# Patient Record
Sex: Male | Born: 1965
Health system: Southern US, Community
[De-identification: ages and names within clinical notes are randomized; demographics above are authoritative.]

## PROBLEM LIST (undated history)

## (undated) DIAGNOSIS — R5382 Chronic fatigue, unspecified: Secondary | ICD-10-CM

## (undated) DIAGNOSIS — F32A Depression, unspecified: Secondary | ICD-10-CM

## (undated) DIAGNOSIS — G8929 Other chronic pain: Secondary | ICD-10-CM

## (undated) DIAGNOSIS — K439 Ventral hernia without obstruction or gangrene: Secondary | ICD-10-CM

## (undated) DIAGNOSIS — K9189 Other postprocedural complications and disorders of digestive system: Secondary | ICD-10-CM

## (undated) DIAGNOSIS — G4730 Sleep apnea, unspecified: Secondary | ICD-10-CM

## (undated) DIAGNOSIS — F319 Bipolar disorder, unspecified: Secondary | ICD-10-CM

## (undated) DIAGNOSIS — M797 Fibromyalgia: Secondary | ICD-10-CM

## (undated) DIAGNOSIS — F419 Anxiety disorder, unspecified: Secondary | ICD-10-CM

## (undated) DIAGNOSIS — E785 Hyperlipidemia, unspecified: Secondary | ICD-10-CM

## (undated) DIAGNOSIS — K56601 Complete intestinal obstruction, unspecified as to cause: Secondary | ICD-10-CM

## (undated) DIAGNOSIS — I10 Essential (primary) hypertension: Secondary | ICD-10-CM

## (undated) DIAGNOSIS — K567 Ileus, unspecified: Secondary | ICD-10-CM

## (undated) HISTORY — DX: Hyperlipidemia, unspecified: E78.5

## (undated) HISTORY — DX: Depression, unspecified: F32.A

## (undated) HISTORY — DX: Complete intestinal obstruction, unspecified as to cause: K56.601

## (undated) HISTORY — DX: Other postprocedural complications and disorders of digestive system: K91.89

## (undated) HISTORY — PX: TONSILLECTOMY: SUR1361

## (undated) HISTORY — PX: SMALL INTESTINE SURGERY: SHX150

## (undated) HISTORY — DX: Ileus, unspecified: K56.7

---
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
## 1998-10-17 ENCOUNTER — Encounter: Admission: RE | Admit: 1998-10-17 | Discharge: 1999-01-15 | Payer: Self-pay | Admitting: Family Medicine

## 1998-12-28 ENCOUNTER — Emergency Department (HOSPITAL_COMMUNITY): Admission: EM | Admit: 1998-12-28 | Discharge: 1998-12-28 | Payer: Self-pay | Admitting: Emergency Medicine

## 1999-02-06 ENCOUNTER — Encounter: Admission: RE | Admit: 1999-02-06 | Discharge: 1999-05-07 | Payer: Self-pay | Admitting: Family Medicine

## 1999-10-06 ENCOUNTER — Encounter: Admission: RE | Admit: 1999-10-06 | Discharge: 2000-01-04 | Payer: Self-pay | Admitting: Family Medicine

## 2000-08-05 ENCOUNTER — Encounter: Payer: Self-pay | Admitting: Ophthalmology

## 2000-08-05 ENCOUNTER — Ambulatory Visit (HOSPITAL_COMMUNITY): Admission: RE | Admit: 2000-08-05 | Discharge: 2000-08-05 | Payer: Self-pay | Admitting: Ophthalmology

## 2000-10-04 ENCOUNTER — Encounter: Admission: RE | Admit: 2000-10-04 | Discharge: 2000-10-13 | Payer: Self-pay | Admitting: Neurology

## 2001-06-16 ENCOUNTER — Encounter: Admission: RE | Admit: 2001-06-16 | Discharge: 2001-06-16 | Payer: Self-pay | Admitting: Family Medicine

## 2001-06-28 ENCOUNTER — Encounter: Admission: RE | Admit: 2001-06-28 | Discharge: 2001-06-28 | Payer: Self-pay | Admitting: Family Medicine

## 2001-11-06 ENCOUNTER — Ambulatory Visit (HOSPITAL_COMMUNITY): Admission: RE | Admit: 2001-11-06 | Discharge: 2001-11-06 | Payer: Self-pay | Admitting: Orthopedic Surgery

## 2001-11-06 ENCOUNTER — Encounter: Payer: Self-pay | Admitting: Orthopedic Surgery

## 2001-11-14 ENCOUNTER — Ambulatory Visit (HOSPITAL_BASED_OUTPATIENT_CLINIC_OR_DEPARTMENT_OTHER): Admission: RE | Admit: 2001-11-14 | Discharge: 2001-11-14 | Payer: Self-pay | Admitting: Orthopedic Surgery

## 2001-12-01 ENCOUNTER — Encounter: Admission: RE | Admit: 2001-12-01 | Discharge: 2001-12-25 | Payer: Self-pay | Admitting: Orthopedic Surgery

## 2002-06-20 ENCOUNTER — Ambulatory Visit (HOSPITAL_COMMUNITY): Admission: RE | Admit: 2002-06-20 | Discharge: 2002-06-20 | Payer: Self-pay | Admitting: Family Medicine

## 2002-06-20 ENCOUNTER — Encounter: Payer: Self-pay | Admitting: Family Medicine

## 2002-09-29 ENCOUNTER — Emergency Department (HOSPITAL_COMMUNITY): Admission: EM | Admit: 2002-09-29 | Discharge: 2002-09-29 | Payer: Self-pay | Admitting: Emergency Medicine

## 2002-09-29 ENCOUNTER — Encounter: Payer: Self-pay | Admitting: Emergency Medicine

## 2002-11-05 ENCOUNTER — Encounter: Payer: Self-pay | Admitting: Family Medicine

## 2002-11-05 ENCOUNTER — Ambulatory Visit (HOSPITAL_COMMUNITY): Admission: RE | Admit: 2002-11-05 | Discharge: 2002-11-05 | Payer: Self-pay | Admitting: Family Medicine

## 2002-11-07 ENCOUNTER — Emergency Department (HOSPITAL_COMMUNITY): Admission: EM | Admit: 2002-11-07 | Discharge: 2002-11-07 | Payer: Self-pay | Admitting: Emergency Medicine

## 2002-11-16 ENCOUNTER — Encounter: Payer: Self-pay | Admitting: Family Medicine

## 2002-11-16 ENCOUNTER — Ambulatory Visit (HOSPITAL_COMMUNITY): Admission: RE | Admit: 2002-11-16 | Discharge: 2002-11-16 | Payer: Self-pay | Admitting: Family Medicine

## 2002-11-26 ENCOUNTER — Encounter: Payer: Self-pay | Admitting: Pulmonary Disease

## 2002-11-26 ENCOUNTER — Ambulatory Visit (HOSPITAL_COMMUNITY): Admission: RE | Admit: 2002-11-26 | Discharge: 2002-11-26 | Payer: Self-pay | Admitting: Pulmonary Disease

## 2002-12-17 ENCOUNTER — Encounter: Payer: Self-pay | Admitting: Family Medicine

## 2002-12-17 ENCOUNTER — Ambulatory Visit (HOSPITAL_COMMUNITY): Admission: RE | Admit: 2002-12-17 | Discharge: 2002-12-17 | Payer: Self-pay | Admitting: Family Medicine

## 2003-04-15 ENCOUNTER — Ambulatory Visit (HOSPITAL_COMMUNITY): Admission: RE | Admit: 2003-04-15 | Discharge: 2003-04-15 | Payer: Self-pay | Admitting: Family Medicine

## 2003-04-15 ENCOUNTER — Encounter: Payer: Self-pay | Admitting: Family Medicine

## 2003-04-29 ENCOUNTER — Ambulatory Visit (HOSPITAL_COMMUNITY): Admission: RE | Admit: 2003-04-29 | Discharge: 2003-04-29 | Payer: Self-pay | Admitting: Family Medicine

## 2003-04-29 ENCOUNTER — Encounter: Payer: Self-pay | Admitting: Family Medicine

## 2003-06-14 ENCOUNTER — Ambulatory Visit (HOSPITAL_BASED_OUTPATIENT_CLINIC_OR_DEPARTMENT_OTHER): Admission: RE | Admit: 2003-06-14 | Discharge: 2003-06-14 | Payer: Self-pay | Admitting: Neurology

## 2003-11-19 ENCOUNTER — Ambulatory Visit (HOSPITAL_COMMUNITY): Admission: RE | Admit: 2003-11-19 | Discharge: 2003-11-19 | Payer: Self-pay | Admitting: Internal Medicine

## 2004-12-27 HISTORY — PX: KNEE ARTHROSCOPY: SUR90

## 2005-04-21 ENCOUNTER — Ambulatory Visit: Payer: Self-pay | Admitting: Gastroenterology

## 2005-05-31 ENCOUNTER — Ambulatory Visit: Payer: Self-pay | Admitting: Gastroenterology

## 2005-06-02 ENCOUNTER — Ambulatory Visit: Payer: Self-pay | Admitting: Gastroenterology

## 2005-08-27 ENCOUNTER — Ambulatory Visit: Payer: Self-pay | Admitting: Family Medicine

## 2005-09-20 ENCOUNTER — Ambulatory Visit: Payer: Self-pay | Admitting: Family Medicine

## 2006-01-06 ENCOUNTER — Ambulatory Visit: Payer: Self-pay | Admitting: Family Medicine

## 2007-02-22 ENCOUNTER — Ambulatory Visit (HOSPITAL_COMMUNITY): Admission: RE | Admit: 2007-02-22 | Discharge: 2007-02-22 | Payer: Self-pay | Admitting: Pulmonary Disease

## 2007-04-26 ENCOUNTER — Ambulatory Visit: Payer: Self-pay | Admitting: Family Medicine

## 2007-04-26 DIAGNOSIS — F329 Major depressive disorder, single episode, unspecified: Secondary | ICD-10-CM | POA: Insufficient documentation

## 2007-04-26 DIAGNOSIS — E782 Mixed hyperlipidemia: Secondary | ICD-10-CM | POA: Insufficient documentation

## 2007-04-26 DIAGNOSIS — E785 Hyperlipidemia, unspecified: Secondary | ICD-10-CM | POA: Insufficient documentation

## 2007-04-26 DIAGNOSIS — J309 Allergic rhinitis, unspecified: Secondary | ICD-10-CM | POA: Insufficient documentation

## 2007-04-26 DIAGNOSIS — F319 Bipolar disorder, unspecified: Secondary | ICD-10-CM | POA: Insufficient documentation

## 2007-04-28 ENCOUNTER — Encounter (INDEPENDENT_AMBULATORY_CARE_PROVIDER_SITE_OTHER): Payer: Self-pay | Admitting: Family Medicine

## 2007-05-01 LAB — CONVERTED CEMR LAB
ALT: 28 units/L (ref 0–53)
AST: 16 units/L (ref 0–37)
Albumin: 4.1 g/dL (ref 3.5–5.2)
Alkaline Phosphatase: 77 units/L (ref 39–117)
BUN: 9 mg/dL (ref 6–23)
Basophils Absolute: 0 10*3/uL (ref 0.0–0.1)
Basophils Relative: 0 % (ref 0–1)
CO2: 28 meq/L (ref 19–32)
Calcium: 9.2 mg/dL (ref 8.4–10.5)
Chloride: 105 meq/L (ref 96–112)
Cholesterol: 187 mg/dL (ref 0–200)
Creatinine, Ser: 1.09 mg/dL (ref 0.40–1.50)
Eosinophils Absolute: 0.1 10*3/uL (ref 0.0–0.7)
Eosinophils Relative: 2 % (ref 0–5)
Glucose, Bld: 87 mg/dL (ref 70–99)
HCT: 46 % (ref 39.0–52.0)
HDL: 36 mg/dL — ABNORMAL LOW (ref >39)
Hemoglobin: 15.5 g/dL (ref 13.0–17.0)
LDL Cholesterol: 100 mg/dL — ABNORMAL HIGH (ref 0–99)
Lymphocytes Relative: 32 % (ref 12–46)
Lymphs Abs: 2.7 10*3/uL (ref 0.7–3.3)
MCHC: 33.7 g/dL (ref 30.0–36.0)
MCV: 88.3 fL (ref 78.0–100.0)
Monocytes Absolute: 0.6 10*3/uL (ref 0.2–0.7)
Monocytes Relative: 7 % (ref 3–11)
Neutro Abs: 4.9 10*3/uL (ref 1.7–7.7)
Neutrophils Relative %: 59 % (ref 43–77)
Platelets: 207 10*3/uL (ref 150–400)
Potassium: 4.3 meq/L (ref 3.5–5.3)
RBC: 5.21 M/uL (ref 4.22–5.81)
RDW: 14 % (ref 11.5–14.0)
Sodium: 143 meq/L (ref 135–145)
TSH: 1.733 microintl units/mL (ref 0.350–5.50)
Total Bilirubin: 1.4 mg/dL — ABNORMAL HIGH (ref 0.3–1.2)
Total CHOL/HDL Ratio: 5.2
Total Protein: 6.6 g/dL (ref 6.0–8.3)
Triglycerides: 253 mg/dL — ABNORMAL HIGH (ref <150)
VLDL: 51 mg/dL — ABNORMAL HIGH (ref 0–40)
WBC: 8.3 10*3/uL (ref 4.0–10.5)

## 2007-05-02 ENCOUNTER — Encounter (INDEPENDENT_AMBULATORY_CARE_PROVIDER_SITE_OTHER): Payer: Self-pay | Admitting: Family Medicine

## 2007-10-16 ENCOUNTER — Telehealth (INDEPENDENT_AMBULATORY_CARE_PROVIDER_SITE_OTHER): Payer: Self-pay | Admitting: Family Medicine

## 2007-10-17 ENCOUNTER — Telehealth (INDEPENDENT_AMBULATORY_CARE_PROVIDER_SITE_OTHER): Payer: Self-pay | Admitting: *Deleted

## 2007-10-17 ENCOUNTER — Ambulatory Visit: Payer: Self-pay | Admitting: Family Medicine

## 2007-10-17 LAB — CONVERTED CEMR LAB
Basophils Absolute: 0 10*3/uL (ref 0.0–0.1)
Basophils Relative: 0 % (ref 0–1)
Bilirubin Urine: NEGATIVE
Blood in Urine, dipstick: NEGATIVE
Eosinophils Absolute: 0.2 10*3/uL (ref 0.0–0.7)
Eosinophils Relative: 2 % (ref 0–5)
Glucose, Urine, Semiquant: NEGATIVE
HCT: 40.6 % (ref 39.0–52.0)
Hemoglobin: 14.3 g/dL (ref 13.0–17.0)
Inflenza A Ag: NEGATIVE
Ketones, urine, test strip: NEGATIVE
Lymphocytes Relative: 39 % (ref 12–46)
Lymphs Abs: 3.5 10*3/uL — ABNORMAL HIGH (ref 0.7–3.3)
MCHC: 35.3 g/dL (ref 30.0–36.0)
MCV: 86.3 fL (ref 78.0–100.0)
Monocytes Absolute: 0.4 10*3/uL (ref 0.2–0.7)
Monocytes Relative: 5 % (ref 3–11)
Neutro Abs: 4.7 10*3/uL (ref 1.7–7.7)
Neutrophils Relative %: 53 % (ref 43–77)
Nitrite: NEGATIVE
Platelets: 200 10*3/uL (ref 150–400)
Protein, U semiquant: NEGATIVE
RBC: 4.7 M/uL (ref 4.22–5.81)
RDW: 13.3 % (ref 11.5–14.0)
Specific Gravity, Urine: >=1.03
Urobilinogen, UA: 0.2
WBC Urine, dipstick: NEGATIVE
WBC: 8.9 10*3/uL (ref 4.0–10.5)
pH: 6

## 2007-10-25 ENCOUNTER — Emergency Department (HOSPITAL_COMMUNITY): Admission: EM | Admit: 2007-10-25 | Discharge: 2007-10-25 | Payer: Self-pay | Admitting: Emergency Medicine

## 2007-10-25 ENCOUNTER — Telehealth (INDEPENDENT_AMBULATORY_CARE_PROVIDER_SITE_OTHER): Payer: Self-pay | Admitting: *Deleted

## 2007-10-31 ENCOUNTER — Ambulatory Visit: Payer: Self-pay | Admitting: Family Medicine

## 2007-11-01 ENCOUNTER — Telehealth (INDEPENDENT_AMBULATORY_CARE_PROVIDER_SITE_OTHER): Payer: Self-pay | Admitting: Family Medicine

## 2007-11-01 ENCOUNTER — Telehealth (INDEPENDENT_AMBULATORY_CARE_PROVIDER_SITE_OTHER): Payer: Self-pay | Admitting: *Deleted

## 2007-11-01 LAB — CONVERTED CEMR LAB: Total CK: 251 units/L — ABNORMAL HIGH (ref 7–232)

## 2007-11-03 ENCOUNTER — Telehealth (INDEPENDENT_AMBULATORY_CARE_PROVIDER_SITE_OTHER): Payer: Self-pay | Admitting: Family Medicine

## 2007-11-06 ENCOUNTER — Ambulatory Visit: Payer: Self-pay | Admitting: Family Medicine

## 2007-11-06 ENCOUNTER — Telehealth (INDEPENDENT_AMBULATORY_CARE_PROVIDER_SITE_OTHER): Payer: Self-pay | Admitting: Family Medicine

## 2007-11-06 DIAGNOSIS — R42 Dizziness and giddiness: Secondary | ICD-10-CM

## 2007-11-06 LAB — CONVERTED CEMR LAB
BUN: 10 mg/dL (ref 6–23)
Basophils Absolute: 0.1 10*3/uL (ref 0.0–0.1)
Basophils Relative: 1 % (ref 0–1)
CO2: 28 meq/L (ref 19–32)
Calcium: 8.6 mg/dL (ref 8.4–10.5)
Chloride: 103 meq/L (ref 96–112)
Creatinine, Ser: 1.01 mg/dL (ref 0.40–1.50)
Eosinophils Absolute: 0.2 10*3/uL (ref 0.2–0.7)
Eosinophils Relative: 2 % (ref 0–5)
Glucose, Bld: 104 mg/dL — ABNORMAL HIGH (ref 70–99)
HCT: 44.1 % (ref 39.0–52.0)
Hemoglobin: 15.3 g/dL (ref 13.0–17.0)
Lymphocytes Relative: 28 % (ref 12–46)
Lymphs Abs: 2.8 10*3/uL (ref 0.7–4.0)
MCHC: 34.7 g/dL (ref 30.0–36.0)
MCV: 87.9 fL (ref 78.0–100.0)
Monocytes Absolute: 0.5 10*3/uL (ref 0.1–1.0)
Monocytes Relative: 5 % (ref 3–12)
Neutro Abs: 6.1 10*3/uL (ref 1.7–7.7)
Neutrophils Relative %: 63 % (ref 43–77)
Platelets: 201 10*3/uL (ref 150–400)
Potassium: 3.9 meq/L (ref 3.5–5.3)
RBC: 5.02 M/uL (ref 4.22–5.81)
RDW: 13.1 % (ref 11.5–15.5)
Sodium: 137 meq/L (ref 135–145)
WBC: 9.6 10*3/uL (ref 4.0–10.5)

## 2007-11-08 ENCOUNTER — Telehealth (INDEPENDENT_AMBULATORY_CARE_PROVIDER_SITE_OTHER): Payer: Self-pay | Admitting: Family Medicine

## 2007-11-10 ENCOUNTER — Ambulatory Visit: Payer: Self-pay | Admitting: Infectious Diseases

## 2007-11-10 DIAGNOSIS — R509 Fever, unspecified: Secondary | ICD-10-CM

## 2007-11-10 LAB — CONVERTED CEMR LAB
ALT: 33 units/L (ref 0–53)
AST: 24 units/L (ref 0–37)
Albumin: 4.4 g/dL (ref 3.5–5.2)
Alkaline Phosphatase: 78 units/L (ref 39–117)
Anti Nuclear Antibody(ANA): NEGATIVE
BUN: 13 mg/dL (ref 6–23)
Basophils Absolute: 0 10*3/uL (ref 0.0–0.1)
Basophils Relative: 0 % (ref 0–1)
Bilirubin Urine: NEGATIVE
CMV IgM: <0.9 (ref <0.90)
CO2: 26 meq/L (ref 19–32)
Calcium: 9.1 mg/dL (ref 8.4–10.5)
Chloride: 101 meq/L (ref 96–112)
Creatinine, Ser: 1.26 mg/dL (ref 0.40–1.50)
Cytomegalovirus Ab-IgG: NEGATIVE
EBV NA IgG: 2.68 — ABNORMAL HIGH
EBV VCA IgG: 3.37 — ABNORMAL HIGH
EBV VCA IgM: 0.38
Eosinophils Absolute: 0.1 10*3/uL — ABNORMAL LOW (ref 0.2–0.7)
Eosinophils Relative: 1 % (ref 0–5)
Glucose, Bld: 71 mg/dL (ref 70–99)
HCT: 47.9 % (ref 39.0–52.0)
HCV Ab: NEGATIVE
Hemoglobin, Urine: NEGATIVE
Hemoglobin: 16.8 g/dL (ref 13.0–17.0)
Hep B Core Total Ab: NEGATIVE
Hep B S Ab: NEGATIVE
Hepatitis B Surface Ag: NEGATIVE
Ketones, ur: NEGATIVE mg/dL
Leukocytes, UA: NEGATIVE
Lymphocytes Relative: 24 % (ref 12–46)
Lymphs Abs: 2 10*3/uL (ref 0.7–4.0)
MCHC: 35.1 g/dL (ref 30.0–36.0)
MCV: 88.2 fL (ref 78.0–100.0)
Monocytes Absolute: 0.4 10*3/uL (ref 0.1–1.0)
Monocytes Relative: 5 % (ref 3–12)
Neutro Abs: 5.8 10*3/uL (ref 1.7–7.7)
Neutrophils Relative %: 70 % (ref 43–77)
Nitrite: NEGATIVE
Platelets: 206 10*3/uL (ref 150–400)
Potassium: 4.1 meq/L (ref 3.5–5.3)
Protein, ur: NEGATIVE mg/dL
RBC: 5.43 M/uL (ref 4.22–5.81)
RDW: 13.9 % (ref 11.5–15.5)
Rhuematoid fact SerPl-aCnc: <20 intl units/mL (ref 0–20)
Sed Rate: 5 mm/hr (ref 0–16)
Sodium: 140 meq/L (ref 135–145)
Specific Gravity, Urine: 1.014 (ref 1.005–1.03)
Total Bilirubin: 1.1 mg/dL (ref 0.3–1.2)
Total Protein: 7.3 g/dL (ref 6.0–8.3)
Toxoplasma Antibody- IgM: 0.27
Toxoplasma IgG Ratio: <0.5
Urine Glucose: NEGATIVE mg/dL
Urobilinogen, UA: 0.2 (ref 0.0–1.0)
WBC: 8.3 10*3/uL (ref 4.0–10.5)
pH: 8 (ref 5.0–8.0)

## 2007-11-13 ENCOUNTER — Ambulatory Visit: Payer: Self-pay | Admitting: Family Medicine

## 2007-11-16 ENCOUNTER — Encounter (INDEPENDENT_AMBULATORY_CARE_PROVIDER_SITE_OTHER): Payer: Self-pay | Admitting: Family Medicine

## 2007-11-21 ENCOUNTER — Encounter (INDEPENDENT_AMBULATORY_CARE_PROVIDER_SITE_OTHER): Payer: Self-pay | Admitting: Family Medicine

## 2007-11-27 ENCOUNTER — Ambulatory Visit: Payer: Self-pay | Admitting: Infectious Diseases

## 2007-12-27 ENCOUNTER — Encounter: Payer: Self-pay | Admitting: Infectious Diseases

## 2007-12-27 ENCOUNTER — Ambulatory Visit: Payer: Self-pay | Admitting: Gastroenterology

## 2008-01-22 ENCOUNTER — Ambulatory Visit: Payer: Self-pay | Admitting: Family Medicine

## 2008-01-22 ENCOUNTER — Telehealth (INDEPENDENT_AMBULATORY_CARE_PROVIDER_SITE_OTHER): Payer: Self-pay | Admitting: *Deleted

## 2008-01-22 DIAGNOSIS — K5289 Other specified noninfective gastroenteritis and colitis: Secondary | ICD-10-CM

## 2008-01-22 LAB — CONVERTED CEMR LAB
BUN: 7 mg/dL (ref 6–23)
Basophils Absolute: 0 10*3/uL (ref 0.0–0.1)
Basophils Relative: 0 % (ref 0–1)
CO2: 26 meq/L (ref 19–32)
Calcium: 8.7 mg/dL (ref 8.4–10.5)
Chloride: 105 meq/L (ref 96–112)
Creatinine, Ser: 1.18 mg/dL (ref 0.40–1.50)
Eosinophils Absolute: 0.1 10*3/uL (ref 0.0–0.7)
Eosinophils Relative: 1 % (ref 0–5)
Glucose, Bld: 123 mg/dL — ABNORMAL HIGH (ref 70–99)
HCT: 46.2 % (ref 39.0–52.0)
Hemoglobin: 16.4 g/dL (ref 13.0–17.0)
Lymphocytes Relative: 27 % (ref 12–46)
Lymphs Abs: 1.9 10*3/uL (ref 0.7–4.0)
MCHC: 35.4 g/dL (ref 30.0–36.0)
MCV: 86.5 fL (ref 78.0–100.0)
Monocytes Absolute: 0.5 10*3/uL (ref 0.1–1.0)
Monocytes Relative: 8 % (ref 3–12)
Neutro Abs: 4.4 10*3/uL (ref 1.7–7.7)
Neutrophils Relative %: 63 % (ref 43–77)
OCCULT 1: NEGATIVE
Platelets: 237 10*3/uL (ref 150–400)
Potassium: 4.1 meq/L (ref 3.5–5.3)
RBC: 5.34 M/uL (ref 4.22–5.81)
RDW: 12.5 % (ref 11.5–15.5)
Sodium: 139 meq/L (ref 135–145)
WBC: 6.9 10*3/uL (ref 4.0–10.5)

## 2008-01-23 ENCOUNTER — Telehealth (INDEPENDENT_AMBULATORY_CARE_PROVIDER_SITE_OTHER): Payer: Self-pay | Admitting: Family Medicine

## 2008-01-24 ENCOUNTER — Encounter (INDEPENDENT_AMBULATORY_CARE_PROVIDER_SITE_OTHER): Payer: Self-pay | Admitting: Family Medicine

## 2008-01-25 ENCOUNTER — Telehealth (INDEPENDENT_AMBULATORY_CARE_PROVIDER_SITE_OTHER): Payer: Self-pay | Admitting: *Deleted

## 2008-01-29 ENCOUNTER — Encounter (INDEPENDENT_AMBULATORY_CARE_PROVIDER_SITE_OTHER): Payer: Self-pay | Admitting: Family Medicine

## 2008-07-26 ENCOUNTER — Emergency Department (HOSPITAL_COMMUNITY): Admission: EM | Admit: 2008-07-26 | Discharge: 2008-07-26 | Payer: Self-pay | Admitting: Emergency Medicine

## 2008-08-23 ENCOUNTER — Encounter (INDEPENDENT_AMBULATORY_CARE_PROVIDER_SITE_OTHER): Payer: Self-pay | Admitting: Family Medicine

## 2010-09-27 ENCOUNTER — Emergency Department (HOSPITAL_COMMUNITY)
Admission: EM | Admit: 2010-09-27 | Discharge: 2010-09-27 | Disposition: A | Discharge status: Other Acute Inpatient Hospital | Payer: Self-pay | Source: Home / Self Care | Admitting: Emergency Medicine

## 2010-11-27 ENCOUNTER — Encounter: Payer: Self-pay | Admitting: Internal Medicine

## 2010-12-28 ENCOUNTER — Emergency Department (HOSPITAL_COMMUNITY)
Admission: EM | Admit: 2010-12-28 | Discharge: 2010-12-28 | Payer: Self-pay | Source: Home / Self Care | Admitting: Emergency Medicine

## 2011-01-26 NOTE — Assessment & Plan Note (Signed)
Summary: work in/arc   Vital Signs:  Patient Profile:   45 Years Old Male Height:     72 inches Weight:      213 pounds BMI:     28.99 O2 Sat:      99 % Temp:     97.4 degrees F Pulse rate:   69 / minute Pulse (ortho):   63 / minute Resp:     14 per minute BP sitting:   140 / 80 BP standing:   133 / 86  Vitals Entered By: Sherilyn Banker (November 06, 2007 9:19 AM)                gauge: 24G site: L anticubical attempts: 1 Pt. tolerated well. Positive blood return.  Zero s/sx infiltration.  IV dc'd.  Cath intact.  Zero s/sx infiltration.  Pt. tolerated well.  Serial Vital Signs/Assessments:  Time      Position  BP       Pulse  Resp  Temp     By           Lying LA  131/84   57                    Kim French           Sitting   141/88   [address]           Standing  133/86   63                    Sherilyn Banker   PCP:  Franchot Heidelberg, MD  Chief Complaint:  nausea / vomiting.  History of Present Illness: Pt in for acute visit. He got sick mid October.  He states he is still sick. He has been struggling with fever (never confirmed here), maylgias (mildly elevated CK) with associated nausea and diarrhea. He had an eval here and was told he had viral illness. He went to [facility] ED and had negative  flu-studies and strep swab that were negative. They treated him with pain medication and he was told to follow-up here.  He denies fever today and states this stopped Saturday night. States some chills. He has had some sweats. He denies stuffy nose. He denies ear pain and sore throat. He denies cough and wheezing as well as chest pain and orthopnea, leg swelling.  He is very nauseated and states he has to lay in the floor not to have it. Dizzy with this. He states this began Thursday morning at 04h00. He has had to go to bathroom 7 otr 8 times a day since. His apetite is os-os and he has been drinking lots of water. He states he is nauseated and he has trhown  up 3 times yesterday  -none today. Has used Phenergan as given ED.   He is weak and tired and he notes he still hurts - rates overall discomfort as 6/10. STates was aweful first thing this am. Felt could not drive to work.  States he has multiple ill contacts at home with same. One of them is going to [facility] for eval. He states he only feels 25% better than he did when he first came in.   He now presents.    Current Allergies (reviewed today): ! PENICILLIN  Past Medical History:    Reviewed history from 04/26/2007  and no changes required:       Severe pneumonia 2003, takes allergy shots       Allergic rhinitis       Depression  Past Surgical History:    Reviewed history from 04/26/2007 and no changes required:       lymph node bx benign - 08/27/2005, T & A - 08/27/2005       LASIK Eye Surgery     Review of Systems      See HPI   Physical Exam  General:     Well-developed,well-nourished,in no acute distress; alert,appropriate and cooperative throughout examination. Appeaers fatigued. SIts still onm chair with head down.  Head:     Normocephalic and atraumatic without obvious abnormalities. No apparent alopecia or balding. Eyes:     No corneal or conjunctival inflammation noted. EOMI. Perrla.  Ears:     External ear exam shows no significant lesions or deformities.  Otoscopic examination reveals clear canals, tympanic membranes are intact bilaterally without bulging, retraction, inflammation or discharge. Hearing is grossly normal bilaterally. Nose:     External nasal examination shows no deformity or inflammation. Nasal mucosa are pink and moist without lesions or exudates. Mouth:     Oral mucosa and oropharynx without lesions or exudates.  Teeth in good repair. Neck:     No deformities, masses, or tenderness noted. Lungs:     Normal respiratory effort, chest expands symmetrically. Lungs are clear to auscultation, no crackles or wheezes. Heart:     Normal rate and  regular rhythm. S1 and S2 normal without gallop, murmur, click, rub or other extra sounds. Abdomen:     Bowel sounds positive but increased,abdomen soft and non-tender without masses, organomegaly or hernias noted. Extremities:     No clubbing, cyanosis, edema, or deformity noted with normal full range of motion of all joints.   Neurologic:     No cranial nerve deficits noted. Station and gait are normal. Plantar reflexes are down-going bilaterally. DTRs are symmetrical throughout. Sensory, motor and coordinative functions appear intact. Cervical Nodes:     No lymphadenopathy noted Psych:     Cognition and judgment appear intact. Alert and cooperative with normal attention span and concentration. No apparent delusions, illusions, hallucinations    Impression & Recommendations:  Problem # 1:  NAUSEA AND VOMITING (ICD-787.01) He was extremely nauseated on presentation and also dizzy though not orthostatic. He threw up once in the restroom and was given single dose Phenergan. He tolerated this well and had no further vomitting with marked improvement in nausea. Will refill script. We did hydarte him based on sx with NS 500 cc and he felt a lot better after. He was smiling and talkative at the end of the visit and notes he was worried when he would start feeling weak again. I have advised him his sx do not fit a specific pattern - fever and chlills, then aching with diarrhea and constipation. He has missed a lot of work. Reassured about labs today i.e. normal CBC and BMP. Will ask infectious disease to eval given persistance of sx since October 17, 2007. Agrees. Referal made by nurse. Wewill defer return to work until we see when ID can see him to help optomize and this will of course also guide return to work.  Orders: T-CBC w/Diff ([id number]) T-Basic Metabolic Panel [phone]) Venipuncture (64403) Promethazine up to 50mg  (J2550) Admin of Therapeutic Inj  intramuscular or subcutaneous  (47425)   Problem # 2:  DIARRHEA (ICD-787.91) See above.  Advised clear liquid diet, gatoraide and advance as tolerated. He is not to use Immodium or similar as this can prolong sx. The sx just started and if they persist for more thasn 72 hours, he is to update [place]. WIll obtain stool studies i.e. C.Diff, OCP etc at that time. Orders: T-CBC w/Diff [id number]) T-Basic Metabolic Panel [phone]) Venipuncture (46962)   Problem # 3:  DIZZINESS (ICD-780.4) Likely early dehydration. Sx resolved wpost IV fluid. Follow. His updated medication list for this problem includes:    Promethazine Hcl 25 Mg Tabs (Promethazine hcl) ..... One every 6 hours as needed  Orders: T-CBC w/Diff ([id number]) T-Basic Metabolic Panel [phone]) Venipuncture (44034)   Problem # 4:  MYALGIA (ICD-729.1) Mildly elavted CK. Tylenol 500 mg two by mouth every 8 hours as needed. Do notfeel narcotic warranted as per ED. Orders: Venipuncture (74259)   Complete Medication List: 1)  Adderall 20 Mg Tabs (Amphetamine-dextroamphetamine) .... Three times a day 2)  Klonopin 0.5 Mg Tabs (Clonazepam) .... 1/2 at bedtime 3)  Protonix 40 Mg Tbec (Pantoprazole sodium) .... One daily 4)  Astelin 137 Mcg/spray Soln (Azelastine hcl) .... As needed 5)  Lexapro 20 Mg Tabs (Escitalopram oxalate) .... One at bedtime 6)  Promethazine Hcl 25 Mg Tabs (Promethazine hcl) .... One every 6 hours as needed   Patient Instructions: 1)  Please schedule a follow-up appointment in 2 weeks - sooner if worse.    Prescriptions: PROMETHAZINE HCL 25 MG  TABS (PROMETHAZINE HCL) One every 6 hours as needed  #30 x 0   Entered and Authorized by:   Franchot Heidelberg MD   Signed by:   Franchot Heidelberg MD on 11/06/2007   Method used:   Print then Give to Patient   RxID:   [id number]  ]  Medication Administration  Injection # 1:    Medication: Promethazine up to 50mg     Diagnosis: NAUSEA AND VOMITING (ICD-787.01)    Route:  IM    Site: L thigh    Exp Date: 11/26/2008    Lot #: [id number]    Mfr: [email or web address]    Patient tolerated injection without complications    Given by: Sherilyn Banker (November 06, 2007 11:34 AM)  Orders Added: 1)  T-CBC w/Diff [[id number]] 2)  T-Basic Metabolic Panel [[id number]] 3)  Venipuncture [42706] 4)  Promethazine up to 50mg  [J2550] 5)  Admin of Therapeutic Inj  intramuscular or subcutaneous [90772] 6)  Est. Patient Level IV [23762]  Appended Document: Fluid Billing    Clinical Lists Changes  Orders: Added new Service order of IV Fluids 1st hr Non Medicare (83151) - Signed Added new Service order of IV Fluids each hr after 1st hr  Non Medicare (76160) - Signed

## 2011-01-26 NOTE — Letter (Signed)
Summary: FMLA paperwork  FMLA paperwork   Imported By: Donneta Romberg 11/16/2007 10:04:21  _____________________________________________________________________  External Attachment:    Type:   Image     Comment:   External Document

## 2011-01-26 NOTE — Letter (Signed)
Summary: PSYCHIATRIC NOTE  PSYCHIATRIC NOTE   Imported By: Magdalene River 04/28/2007 13:48:29  _____________________________________________________________________  External Attachment:    Type:   Image     Comment:   External Document

## 2011-01-26 NOTE — Letter (Signed)
Summary: ING paperwork  ING paperwork   Imported By: Donneta Romberg 11/21/2007 15:13:41  _____________________________________________________________________  External Attachment:    Type:   Image     Comment:   External Document

## 2011-01-26 NOTE — Miscellaneous (Signed)
Summary: Orders Update  Clinical Lists Changes  Orders: Added new Referral order of Infectious Disease Referral (ID) - Signed

## 2011-01-26 NOTE — Progress Notes (Signed)
Summary: 10/31/07 lab results  Phone Note Outgoing Call   Call placed by: Sonny Dandy,  November 01, 2007 9:49 AM Summary of Call: Results given to patient, voices understanding .................................................................Marland KitchenMarland KitchenSonny Dandy  November 01, 2007 9:49 AM   Initial call taken by: Sonny Dandy,  November 01, 2007 9:49 AM

## 2011-01-26 NOTE — Assessment & Plan Note (Signed)
Summary: FU OV/VS   PCP:  Franchot Heidelberg, MD  Chief Complaint:  f/u.   Has chest congestion and coughing up green drainage.Marland Kitchen  History of Present Illness: 45 yo M seen prev in ID clinic for fuo and loose BM.   CT chest/abd/pelvis - not yet done. EBV, CMV, Toxo - negative  bxc, ucx - negative stool wbc, o and p, cx, c diff - negative ana - negative hepatitis b/c, hiv - negative  Now presents with c/o that his wife coughed on him and gave him sinusitis and bronchitis.  Has taken no anbx. Has gone back to work.  States that the aches, fevers have resolved. Diarrhea has improved somewhat. Has had some reflux symptoms.  Had low grade temp last PM (<100).   Current Allergies: ! PENICILLIN      Vital Signs:  Patient Profile:   46 Years Old Male Height:     72 inches (182.88 cm) Weight:      213.13 pounds BMI:     29.01 Temp:     99.2 degrees F oral Pulse rate:   82 / minute BP sitting:   153 / 90  (left arm)  Pt. in pain?   no  Vitals Entered By: Starleen Arms (November 27, 2007 10:15 AM)                  Physical Exam  General:     well-developed, well-nourished, and well-hydrated.   Eyes:     pupils equal, pupils round, and pupils reactive to light.   Mouth:     pharynx pink and moist and no exudates.   Neck:     no masses.   Lungs:     normal respiratory effort and normal breath sounds.   Heart:     normal rate, regular rhythm, and no murmur.   Abdomen:     soft, non-tender, normal bowel sounds, no distention, no masses, no guarding, and no rigidity.      Impression & Recommendations:  Problem # 1:  FEVER UNSPECIFIED (ICD-780.60) he appears to be doing well- temps resolved. He has not had his CT scan but given his improvement, will defer this.  I have asked him to follow up with Dr Blossom Hoops regarding his continued loose BM and gi sx. He states that he previously had "diverticulitis".  I have asked him as well to resume a previous rx for  allegra-d for his sinus symptoms. I have some concern that this may exacerbate his bipolar d/o.  He will follow up in ID clinic on a as needed basis.  Orders: Est. Patient Level IV (60454)      ]

## 2011-01-26 NOTE — Letter (Signed)
Summary: old medical records  old medical records   Imported By: Magdalene River 05/02/2007 10:38:37  _____________________________________________________________________  External Attachment:    Type:   Image     Comment:   External Document

## 2011-01-26 NOTE — Assessment & Plan Note (Signed)
Summary: work in/decreased temp/arc   Vital Signs:  Patient Profile:   45 Years Old Male Height:     72 inches Weight:      211 pounds BMI:     28.72 O2 Sat:      98 % Temp:     97.2 degrees F Pulse rate:   78 / minute Resp:     14 per minute BP sitting:   118 / 76  Vitals Entered By: Sherilyn Banker (October 17, 2007 8:59 AM)                 PCP:  Franchot Heidelberg, MD  Chief Complaint:  has had fever as high as 102.0 for 4 days.  History of Present Illness: Pt in for acute visit.  He states he took off work last week on Monday, Tuesday and Wednesday. Had PAL. Notes wife had same and he had four different neighbors who came down with same. States they had fever and on Thursday morning woke up with fever at 102F. States he took Tylenol and Ibuprofen. Did not go to work Thursday or Friday. States felt better Saturday and began running fever again late Saturday. States has been hovering in the low 100s.  Still using Tylenol. States get sweaty and has chills. Feels tired and worn out and barely had the enrgy to come up here today. Last meds taken were Ibuprofen early this am.  Has not used Psych meds last day or two.  He denies ear pain, sore throat, no naal congestion or sneezing. He has no cough or sputum production. He states his apetite is down. He has some nausea. He denies vomitting. No diarrhea or constipation.   He notes no urinary sx including burning, stinging and bad smells.  He notes he just hurts all over - muscles are achy and he states fever yesterday was up into the 102s again.  He notes he did have some loose stools over the weekend. He did have a flu-shot about two weeks ago.  Now presents.   Current Allergies (reviewed today): ! PENICILLIN  Past Medical History:    Reviewed history from 04/26/2007 and no changes required:       Severe pneumonia 2003, takes allergy shots       Allergic rhinitis       Depression  Past Surgical History:    Reviewed  history from 04/26/2007 and no changes required:       lymph node bx benign - 08/27/2005, T & A - 08/27/2005       LASIK Eye Surgery   Family History:    Reviewed history from 04/26/2007 and no changes required:       Father: 27 Lipids/Glucose problems       Mother: 23 Lipids       Siblings: Sister 71: Healthy except for uterine fibroids         Social History:    Reviewed history from 04/26/2007 and no changes required:       Occupation: Holiday representative for IT at [employer]       Married       Former Smoker       Social ETOH use    Review of Systems      See HPI   Physical Exam  General:     Tired appearing male in no acute distress. Alert and Oriented. Head:     Normocephalic and atraumatic without obvious abnormalities. No apparent alopecia or balding. Eyes:  PERRLA. Normal EOM. Ears:     TMS clear. Nose:     External nasal examination shows no deformity or inflammation. Nasal mucosa are pink and moist without lesions or exudates. Mouth:     Oral mucosa and oropharynx without lesions or exudates.  Neck:     No deformities, masses, or tenderness noted. Lungs:     Normal respiratory effort, chest expands symmetrically. Lungs are clear to auscultation, no crackles or wheezes. Heart:     Normal rate and regular rhythm. S1 and S2 normal without gallop, murmur, click, rub or other extra sounds. Abdomen:     Bowel sounds positive,abdomen soft and non-tender without masses, organomegaly or hernias noted. Extremities:     No clubbing, cyanosis, edema, or deformity noted with normal full range of motion of all joints.   Psych:     Cognition and judgment appear intact. Alert and cooperative with normal attention span and concentration. No apparent delusions, illusions, hallucinations    Impression & Recommendations:  Problem # 1:  FEBRILE ILLNESS (ICD-780.6) Discussed. Flu-swab negative and urine unremarkable. Await stat CBC. Suspect viral illness. Advised fluid push,  daily MVI and plenty of rest. May use Tylenol Cold OTC as directed. Gave script for Doxy. Not to fill until result of CBC reviewed - discussed how diff will guide Rx.  Agrees. Recheck if sx worsen. Orders: T-CBC w/Diff ([id number]) UA Dipstick w/o Micro (81002) Flu A+B (56213)   Complete Medication List: 1)  Adderall 20 Mg Tabs (Amphetamine-dextroamphetamine) .... Three times a day 2)  Klonopin 0.5 Mg Tabs (Clonazepam) .... 1/2 at bedtime 3)  Protonix 40 Mg Tbec (Pantoprazole sodium) .... One daily 4)  Astelin 137 Mcg/spray Soln (Azelastine hcl) .... As needed 5)  Doxy-caps 100 Mg Caps (Doxycycline hyclate) .... One two times a day 6)  Lexapro 20 Mg Tabs (Escitalopram oxalate) .... One at bedtime   Patient Instructions: 1)  Please schedule a follow-up appointment in 2 weeks.    Prescriptions: DOXY-CAPS 100 MG  CAPS (DOXYCYCLINE HYCLATE) One two times a day  #20 x 0   Entered and Authorized by:   Franchot Heidelberg MD   Signed by:   Franchot Heidelberg MD on 10/17/2007   Method used:   Print then Give to Patient   RxID:   [id number]  ]  Preventive Care Screening  Last Flu Shot:    Date:  10/06/2007    Next Due:  09/2008    Results:  Hx of   Laboratory Results   Urine Tests    Routine Urinalysis   Color: yellow Appearance: Clear Glucose: negative   (Normal Range: Negative) Bilirubin: negative   (Normal Range: Negative) Ketone: negative   (Normal Range: Negative) Spec. Gravity: >=1.030   (Normal Range: 1.003-1.035) Blood: negative   (Normal Range: Negative) pH: 6.0   (Normal Range: 5.0-8.0) Protein: negative   (Normal Range: Negative) Urobilinogen: 0.2   (Normal Range: 0-1) Nitrite: negative   (Normal Range: Negative) Leukocyte Esterace: negative   (Normal Range: Negative)      Other Tests  Influenza: negative

## 2011-01-26 NOTE — Progress Notes (Signed)
Summary: advised to go to ER  Phone Note Call from Patient   Summary of Call: patient called stating he no feeling any better and is still having fevers off and on. Now is complaining of hands swelling and change in coloring. After speaking with Dr Fonnie Jarvis about this he advised me that Mr [name] [name] needed to go to the ER about this because this has been going on for two weeks now, minus hand swelling.   Initial call taken by: Donneta Romberg,  October 25, 2007 3:37 PM

## 2011-01-26 NOTE — Progress Notes (Signed)
Summary: 01/23/08 lab results  Phone Note Outgoing Call   Call placed by: Sonny Dandy,  January 25, 2008 9:29 AM Summary of Call: Results given to patient, voices understanding  .................................................................Marland KitchenMarland KitchenSonny Dandy  January 25, 2008 9:29 AM  Initial call taken by: Sonny Dandy,  January 25, 2008 9:29 AM         Appended Document: 01/23/08 lab results patient called back and said he had taken his daughter to the ER today with the same symptons he is having. Stated still having loose stools. He thinks he is going to the Urgent Care in [place].

## 2011-01-26 NOTE — Assessment & Plan Note (Signed)
Summary: new patient/work in/arc   Vital Signs:  Patient Profile:   45 Years Old Male Height:     72 inches Weight:      206 pounds BMI:     28.04 O2 Sat:      100 % Temp:     98.0 degrees F Pulse rate:   92 / minute Resp:     16 per minute BP sitting:   138 / 82  Vitals Entered By: Sherilyn Banker (April 26, 2007 10:16 AM)               PCP:  Franchot Heidelberg, MD  Chief Complaint:  establish.  History of Present Illness: Pt in today to establish.  He was seen by Dr. Alain Marion.  He has had very high lipids in the past. States he had this checked a year and half ago. Was very high. Levels were still high in September 2007. Did dietary modifciation. States he increased apples and fruits. Had blood check in Jaunuary 08 and he brought this down to the 170s total. He has begun kayaking as well and notes he is a lot busier.  He states he feels like he gets low blood sugars at lunch time. Feels shaky. He started Geodon in August for his Bipolar Disorder and notes this has helped his Bipolar. He sees Dr. Lafayette Dragon for this and last saw her a few months ago. He has mild bipolar disorder and ADHD. He sleeps well at night but has trouble falling asleep some time. Has tried generic Ambien and it helped but he hates taking it long term. Tried Klonopin 0.25 and it helps a lot. He is not irritable. His concentration is good. Energy level is fair. Sex life is good. He manages finances well.  Risk factors for CAD:   1. Male 2. Overweight 3. Hyperlipidemia 4. No HTN 5. Fam hx: Maternal Grandomother in her 30s. 6. He does not smoke - hx of - started age 31 smoked less than pack per day - quit 2003 7. No hx of DM - strong fam hx.  Nowpresents.    Hypertension History:      Positive major cardiovascular risk factors include hyperlipidemia.  Negative major cardiovascular risk factors include male age less than 51 years old, negative family history for ischemic heart disease, and  non-tobacco-user status.     Current Allergies (reviewed today): ! PENICILLIN  Past Medical History:    Reviewed history from 02/23/2007 and no changes required:       Severe pneumonia 2003       Takes allergy shots  Past Surgical History:    Reviewed history from 02/23/2007 and no changes required:       lymph node bx benign - 08/27/2005, T & A - 08/27/2005       LASIK Eye Surgery   Family History:    Reviewed history from 02/23/2007 and no changes required:       Father: 29 Lipids/Glucose problems       Mother: 37 Lipids       Siblings: Sister 70: Healthy except for uterine fibroids         Social History:    Reviewed history from 02/23/2007 and no changes required:       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  ENT      He gest Allergy shots at [facility] once weekly. He uses Astelin as needed once in a while.  CV      Denies chest pain or discomfort, fatigue, lightheadness, swelling of feet, swelling of hands, and weight gain.  Resp      Denies chest discomfort, shortness of breath, sputum productive, and wheezing.  GI      Denies abdominal pain, constipation, diarrhea, nausea, and vomiting.  GU      Denies decreased libido, nocturia, urinary frequency, and urinary hesitancy.  MS      Denies joint pain, low back pain, muscle, stiffness, and thoracic pain.  Derm      Denies changes in color of skin, hair loss, itching, and rash.  Neuro      Denies headaches, memory loss, seizures, and tremors.  Psych      Denies anxiety and depression.  Endo      Denies cold intolerance, excessive  hunger, excessive thirst, excessive urination, heat intolerance, polyuria, and weight change.  Heme      See HPI      Denies abnormal bruising, bleeding, enlarge lymph nodes, fevers, and pallor.   Physical Exam  General:     Well-developed,well-nourished,in no acute distress; alert,appropriate and cooperative throughout examination Head:     Normocephalic and atraumatic without obvious abnormalities. No apparent alopecia or balding. Eyes:     No corneal or conjunctival inflammation noted. EOMI. Perrla. Funduscopic exam benign, without hemorrhages, exudates or papilledema. Vision grossly normal. Ears:     External ear exam shows no significant lesions or deformities.  Otoscopic examination reveals clear canals, tympanic membranes are intact bilaterally without bulging, retraction, inflammation or discharge. Hearing is grossly normal bilaterally. Nose:     External nasal examination shows no deformity or inflammation. Nasal mucosa are pink and moist without lesions or exudates. Mouth:     Oral mucosa and oropharynx without lesions or exudates.  Teeth in good repair. Neck:     No deformities, masses, or tenderness noted. Lungs:     Normal respiratory effort, chest expands symmetrically. Lungs are clear to auscultation, no crackles or wheezes. Heart:     Normal rate and regular rhythm. S1 and S2 normal without gallop, murmur, click, rub or other extra sounds. Abdomen:     Bowel sounds positive,abdomen soft and non-tender without masses, organomegaly or hernias noted. Extremities:     No clubbing, cyanosis, edema, or deformity noted with normal full range of motion of all joints.   Cervical Nodes:     No lymphadenopathy noted Psych:     Cognition and judgment appear intact. Alert and cooperative with normal attention span and concentration. No apparent delusions, illusions, hallucinations    Impression & Recommendations:  Problem # 1:  HYPERLIPIDEMIA (ICD-272.4) Discussed. Chck  labs and obtain baseline. Review and optomize per ATP II guidelines. Discussed need for diet, exersize and low salt intake. Orders: T-Comprehensive Metabolic Panel [phone]) T-Lipid Profile [phone]) T-TSH [id number])   Problem # 2:  Sx of HYPOGLYCEMIA (ICD-251.2) Check labs on high risk med  in form of Geodon. Advised yearly eye exam given cataract risk. Await labs and optomize. Consider OGT test.  Problem # 3:  DISORDER, BIPOLAR NOS (ICD-296.80) Records from Psych. Orders: T-CBC w/Diff ([id number]) T-TSH [phone])   Problem # 4:  ALLERGIC RHINITIS (ICD-477.9) Rx per Adolph Pollack. The following medications were removed from the medication list:    Astelin 137 Mcg/spray Soln (Azelastine hcl) .Marland Kitchen... As needed  His updated medication list for this problem includes:    Astelin 137 Mcg/spray Soln (Azelastine hcl) .Marland Kitchen... As needed   Problem # 5:  Preventive Health Care (ICD-V70.0) Discussed sunscreen for skin cancer prevention and also discussed need for yearly eye exams and dental care.  Medications Added to Medication List This Visit: 1)  Geodon 20 Mg Caps (Ziprasidone hcl) .... One daily 2)  Klonopin 0.5 Mg Tabs (Clonazepam) .... 1/2 at bedtime 3)  Protonix 40 Mg Tbec (Pantoprazole sodium) .... One daily  Hypertension Assessment/Plan:      The patient's hypertensive risk group is category B: At least one risk factor (excluding diabetes) with no target organ damage.  Today's blood pressure is 138/82.  His blood pressure goal is < 140/90.   Patient Instructions: 1)  Please schedule a follow-up appointment in 1 month - sooner if needed.  Appended Document: new patient/work in/arc patient records have been requested from dr fagan and dr Lyanne Co...[phone].Marland Kitchenarc

## 2011-01-26 NOTE — Letter (Signed)
Summary: out of work letter  out of work letter   Imported By: Donneta Romberg 02/01/2008 10:31:48  _____________________________________________________________________  External Attachment:    Type:   Image     Comment:   External Document

## 2011-01-26 NOTE — Miscellaneous (Signed)
Summary: Informed Consent to AIDS Virus(HIV) Results  Informed Consent to AIDS Virus(HIV) Results   Imported By: Randon Goldsmith 01/30/2008 11:26:55  _____________________________________________________________________  External Attachment:    Type:   Image     Comment:   External Document

## 2011-01-26 NOTE — Assessment & Plan Note (Signed)
Summary: vomiting/diarrhea/rrs   Vital Signs:  Patient Profile:   45 Years Old Male Height:     72 inches (182.88 cm) Weight:      208 pounds Temp:     98.6 degrees F Pulse rate:   86 / minute Resp:     16 per minute BP supine:   133 / 82 BP sitting:   143 / 89  (left arm) BP standing:   136 / 88  Vitals Entered BySonny Dandy (January 22, 2008 11:22 AM)             Comments Began on last Tuesday feeling bad. On Wednesday nausea began and Friday started N/V/D. Drinking ginger ale and phenergan. Orthostatics done and results to Dr Erby Pian.  IVF NS up at 11:58am. IV started with #24 angio(Attempt x3, 2x with #20, 1 time with #24) in L forearm.      PCP:  Franchot Heidelberg, MD  Chief Complaint:  N-V-D.  History of Present Illness: Pt in for acute visit. Father-in-law in waiting room.  He notes he is sick. States began last Tuesday. He felt off and stayed at work. Then came Wednesday and sx hit at lunch. Took PAL Thursday and Friday. He notes he was extremely thirsty and started throwing up Saturday and having diarrhea.   He denies fever, chills and sweats but notes ran some late last week. Did not check level.   He states his apetite is down. Ate some nuts on Friday to keep protein up. Also had chicked sandwich.  He is nauseated and has not thrown up. He has drunk ginger ale. States when he vomits the vomit consists of food. Was slightly dark over the weekend. He has not had a lose BM since early morning - had coupl;e of bouts last night.  States stools was dark, watery. He has some abdominal cramping and notes achy all over.  He has felt bloated. Used some Phenergan and it helps a lot - had left over from last illness. States had a little bright red blood when wiping.  Denies recent abx.   He saw Dr. Christella Hartigan in Dec 2008. Advised sx of IBS and need for possible colonoscopy if sx of nausea/diarrhea recurred. Advised use of Citrucel as he had predominant sx of constipation at  the time. He states has not had follow-up scheduled yet.  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  Ill contact in [place] who got sick Staurday. Denies eating anywhere strange but admits ate some Lance Peanut butter crackers. States ate at [employer] - had chicken and notes this was Friday.  Has felt a little dizzy. States feels aweful. Just tired.  Now presents  Current Allergies (reviewed today): ! PENICILLIN  Past Medical History:    Reviewed history from 04/26/2007 and no changes required:       Severe pneumonia 2003, takes allergy shots       Allergic rhinitis       Depression  Past Surgical History:    Reviewed history from 04/26/2007 and no changes required:       lymph node bx benign - 08/27/2005, T & A - 08/27/2005       LASIK Eye Surgery   Family History:    Reviewed history from 04/26/2007 and no changes required:       Father: 58 Lipids/Glucose problems       Mother: 77 Lipids       Siblings: Sister  39: Healthy except for uterine fibroids         Social History:    Reviewed history from 11/10/2007 and no changes required:       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        Former Smoker       Social ETOH use   Risk Factors: Tobacco use:  quit    Year quit:  2003    Pack-years:  1 pack per day for few years  Family History Risk Factors:    Family History of MI in females < 49 years old:  no    Family History of MI in males < 63 years old:  no   Review of Systems      See HPI   Physical Exam  General:     Well-developed,well-nourished,in no acute distress; alert,appropriate and cooperative throughout examination. Mouth dry. Lungs:     Normal respiratory effort, chest expands symmetrically. Lungs are clear to auscultation, no crackles or wheezes. Heart:     Normal rate and regular rhythm. S1 and S2 normal without gallop, murmur, click, rub or other extra sounds. Abdomen:     Soft, NT, BS increased,  no obvious mass or organomgelaly.  Rectal:     Note external hemorhoid at 1 to 3 o'clock. Normal sphincter tone. No rectal masses or tenderness. Loose green stool in rectal vault - heme-occult negative. Extremities:     No clubbing, cyanosis, edema, or deformity noted with normal full range of motion of all joints.   Cervical Nodes:     No lymphadenopathy noted Psych:     Cognition and judgment appear intact. Alert and cooperative with normal attention span and concentration. No apparent delusions, illusions, hallucinations    Impression & Recommendations:  Problem # 1:  GASTROENTERITIS, ACUTE (ICD-558.9) Discussed. Suspect viral ailment. See note from Dr. Christella Hartigan with regard to GI input on similar sx in past. Advised need for hydration as clinicaly dizzy. Borderline orthostaisis based on BP readings and HR reading specifically. Gave 500 cc of NS in office and advised fluid push at home using gatorade, ginger ale. Will treat nausea with Phenergan. Avoid imodium and Pepto as  can prolong sx and potentially lead to carrier state if certain types of GI infection. We did a stat CBC and BMP toay and results are will be reviewed with call to patient once received.. Given his hx will check stool for ova, cyst and parasits as well as C.Diff.  Per Dr. Christella Hartigan, wanted to see him back if sx recurred and hence we will refer back to him to aide in optomization. We will give work-excuse for today.  Update if sx worsen or progress. Note BP did not reveal orthostasis but HR 75 lying down, 82 sitting and 94 standing. Felt better post IV hydration. ED eval if sx worsen. Orders: T-Basic Metabolic Panel [id number]) T-CBC w/Diff [id number]) T- * Misc. Laboratory test [phone]) Gastroenterology Referral (GI) Venipuncture (13086) IV Fluids 1st hr Non Medicare (57846) Intravenous Infusion Therapy Diag 1 hr (96295) Hemoccult Guaiac-1 spec.(in office) (28413)   Problem # 2:  DIZZINESS (ICD-780.4) Likley volume  depletion. His updated medication list for this problem includes:    Promethazine Hcl 25 Mg Tabs (Promethazine hcl) ..... One every 6 hours as needed  Orders: IV Fluids 1st hr Non Medicare (24401)   Problem # 3:  Disposition Pt used up ll sick time and PAL due to last  illness. States cannot take more time off. Advised will give excuse for today. If prolonged, may need to look at short term disability. Verbalzied agreement and understaning. I suspect also his Bipolar disorder plays role in exacerbating sx and this will needfurther optomization once acute ailment resolves. It is of note that he talked to nursing about carbon dioxide poisoning as cause, viral infections etc. Also of note never went to bathroom while here i.e. no vomitting or diarrhea noted during time here.  Complete Medication List: 1)  Adderall 20 Mg Tabs (Amphetamine-dextroamphetamine) .... Three times a day 2)  Klonopin 0.5 Mg Tabs (Clonazepam) .... 1/2 at bedtime 3)  Protonix 40 Mg Tbec (Pantoprazole sodium) .... One daily 4)  Astelin 137 Mcg/spray Soln (Azelastine hcl) .... As needed 5)  Lexapro 20 Mg Tabs (Escitalopram oxalate) .... One at bedtime 6)  Promethazine Hcl 25 Mg Tabs (Promethazine hcl) .... One every 6 hours as needed   Patient Instructions: 1)  Please schedule a follow-up appointment in 2 weeks - sooner if worse.    Prescriptions: PROMETHAZINE HCL 25 MG  TABS (PROMETHAZINE HCL) One every 6 hours as needed  #30 x 0   Entered and Authorized by:   Franchot Heidelberg MD   Signed by:   Franchot Heidelberg MD on 01/22/2008   Method used:   Print then Give to Patient   RxID:   [id number]  ]  Orders Added: 1)  T-Basic Metabolic Panel [[id number]] 2)  T-CBC w/Diff [[id number]] 3)  T- * Misc. Laboratory test [phone] 4)  Gastroenterology Referral [GI] 5)  Est. Patient Level IV [84132] 6)  Venipuncture [44010] 7)  IV Fluids 1st hr Non Medicare [96360] 8)  Intravenous Infusion Therapy Diag 1 hr  [96360] 9)  Hemoccult Guaiac-1 spec.(in office) [82272]  Laboratory Results    Stool - Occult Blood Hemmoccult #1: negative Date: 01/22/2008  Kit Test Internal QC: Positive   (Normal Range: Negative)   Appended Document: vomiting/diarrhea/rrs Call from [facility], Angie, on acute labs - all normal except glucose 123. Non-fasting. Nurse to call

## 2011-01-26 NOTE — Progress Notes (Signed)
Summary: Increased temp  Phone Note Call from Patient   Caller: Patient Summary of Call: recieved call from patient, c/o temp, chills and sweating. Using OTC meds and wants to be seen.   Appoint offered for 10/17/07 at 900am.  please schedule Initial call taken by: Sonny Dandy,  October 16, 2007 4:16 PM  Follow-up for Phone Call        Noted. Follow-up by: Franchot Heidelberg MD,  October 16, 2007 4:18 PM         Appended Document: Increased temp appt scheduled.Marland Kitchenarc

## 2011-01-26 NOTE — Assessment & Plan Note (Signed)
Summary: F/U FOR COUGHING AND VIRUS   Vital Signs:  Patient Profile:   45 Years Old Male Height:     72 inches Weight:      214 pounds BMI:     29.13 O2 Sat:      100 % Temp:     97.3 degrees F Pulse rate:   73 / minute Resp:     14 per minute BP sitting:   149 / 86  Vitals Entered By: Sherilyn Banker (October 31, 2007 3:52 PM)                 PCP:  Franchot Heidelberg, MD  Chief Complaint:  follow up visit.  History of Present Illness: Pt in for recheck.  He has been sick since shortly prior to October 17, 2007. He was seen here and diagnosed with viral URI. His sx did not improve and he went to [facility] ED on October 25, 2007. He had a BMP and CBC and had mild elevated WBC at 13.4 and his BMP showed mild elevated random gluocse at 100. He had negative flu swabs and strep swab. Advised viral illness and gave phergean and Lortab. He notes he is starting to feel better. He has not had the fevers as before and states he is still coughing some. He states this dry to productive with white plegm. Using Tylenol Cold and Allegra and this helps. He still has body aches and states Tylenol helps nothing. Using some Vicodin and states pain still there. He denies nausea and vomitting and he has not had diarrhea or constipation.  States stools were awefully loose before.   He continues on Adderal, Geodon and Klonopin. He has been on the Adderral for 3 to 4 years, Lexapro for since August 2008, and Klonopin for 10 years.   He has been off work since his first visit here.   Current Allergies (reviewed today): ! PENICILLIN  Past Medical History:    Reviewed history from 04/26/2007 and no changes required:       Severe pneumonia 2003, takes allergy shots       Allergic rhinitis       Depression  Past Surgical History:    Reviewed history from 04/26/2007 and no changes required:       lymph node bx benign - 08/27/2005, T & A - 08/27/2005       LASIK Eye Surgery   Family History:  Reviewed history from 04/26/2007 and no changes required:       Father: 59 Lipids/Glucose problems       Mother: 67 Lipids       Siblings: Sister 46: Healthy except for uterine fibroids         Social History:    Reviewed history from 04/26/2007 and no changes required:       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  CV      Denies chest pain or discomfort, swelling of feet, swelling of hands, and weight gain.  Resp      See HPI  GI      Denies abdominal pain, constipation, diarrhea, nausea, and vomiting.  GU      Denies decreased libido, nocturia,  urinary frequency, and urinary hesitancy.   Physical Exam  General:     Tired appearing male in no acute distress. Alert and Oriented. Head:     Normocephalic and atraumatic without obvious abnormalities. No apparent alopecia or balding. Eyes:     No corneal or conjunctival inflammation noted. EOMI. Perrla.  Ears:     External ear exam shows no significant lesions or deformities.  Otoscopic examination reveals clear canals, tympanic membranes are intact bilaterally without bulging, retraction, inflammation or discharge. Hearing is grossly normal bilaterally. Nose:     External nasal examination shows no deformity or inflammation. Nasal mucosa are pink and moist without lesions or exudates. Mouth:     Oral mucosa and oropharynx without lesions or exudates.  Teeth in good repair. Neck:     No deformities, masses, or tenderness noted. Lungs:     Normal respiratory effort, chest expands symmetrically. Lungs are clear to auscultation, no crackles or wheezes. Heart:     Normal rate and regular rhythm. S1 and S2 normal without gallop, murmur, click, rub or other extra sounds. Abdomen:     Bowel sounds positive,abdomen soft and non-tender without masses, organomegaly or hernias  noted. Extremities:     No clubbing, cyanosis, edema, or deformity noted with normal full range of motion of all joints.   Psych:     Cognition and judgment appear intact. Alert and cooperative with normal attention span and concentration. No apparent delusions, illusions, hallucinations    Impression & Recommendations:  Problem # 1:  VIRAL INFECTION (ICD-079.99) Off work now since October 17, 2007. Has not been back to work. Advised certainly viral ailments can take up to 21 days to resolve. He appearsto be improving but is fatigued and achy. He requests more abx. Councelled lack of benefit with this based on viral nature and all that can be recommended at present is rest, hydration and pain control. He is to use OTC Tylenol 500mg  two every 6 hours as needed for aches and pains. Do not feel narcotics appropraite as prescribed by ED. Agrees. I will give him excuse until next Monday and if sx not improved will need ID consult. I will also per his request try to contact [name] - [phone], his boss to update on  status. Recheck one week - sooner if worse.  Orders: T- * Misc. Laboratory test [phone])   Problem # 2:  MYALGIA (ICD-729.1) Medications reviewed. Sx only since acute illness. Likely unrelated.  Check CK and optomize. Orders: T- * Misc. Laboratory test [phone])   Problem # 3:  DISORDER, BIPOLAR NOS (ICD-296.80) Meds as is. Councelled close Pscyh follow-up.  Complete Medication List: 1)  Adderall 20 Mg Tabs (Amphetamine-dextroamphetamine) .... Three times a day 2)  Klonopin 0.5 Mg Tabs (Clonazepam) .... 1/2 at bedtime 3)  Protonix 40 Mg Tbec (Pantoprazole sodium) .... One daily 4)  Astelin 137 Mcg/spray Soln (Azelastine hcl) .... As needed 5)  Lexapro 20 Mg Tabs (Escitalopram oxalate) .... One at bedtime     ]

## 2011-01-26 NOTE — Letter (Signed)
Summary: Out of Work  [facility]  [address]   [place], [place] [address]   Phone: [phone]  Fax: [phone]    November 13, 2007   Employee:  Kennieth D Grupe    To Whom It May Concern:   Pt may return to work 11/15/2007  If you need additional information, please feel free to contact our office.         Sincerely,       Franchot Heidelberg MD

## 2011-01-26 NOTE — Assessment & Plan Note (Signed)
Summary: 2 week follow up/ dms   Vital Signs:  Patient Profile:   45 Years Old Male Height:     72 inches (182.88 cm) Weight:      218 pounds BMI:     29.67 O2 Sat:      98 % Temp:     97.8 degrees F Pulse rate:   85 / minute Resp:     16 per minute BP sitting:   150 / 92  Vitals Entered By: Sherilyn Banker (November 13, 2007 3:00 PM)                 PCP:  Franchot Heidelberg, MD  Chief Complaint:  follow up visit.  History of Present Illness: Pt in for recheck.  He was referred to ID for a viral ailment. He saw Dr. Ninetta Lights. He is set for recheck Nov 27, 2007. Has not heard about any results as of yet. States was told all would be hooked up to EMR. States was supposed to have CT chest and [employer] has togive pre-approval for this.  He notes he is feeling a lot better. He states today his fever has resolved. He has had some GI upset. He states he has a good apetite. He is eating almost everything he can and states he has not had the nausea or vomitting. He has not used any promethasine sine Saturday. He states the stools are just loose. He describes this as green. States consists of whatever he eats. He is pushing fluids. He is using gatoraide. He does admit he saw Dr. Adolph Pollack in the past and has used some Zegerid and Pamine Forte to see if he can stop the diarrhea. Notes this cleasred things and stool now just watery.  He states he got fever back late last week. None since Saturday. Still tired. Admits he was out blwoing some leafs today. Made him tired.  Denies any further body aches.   Labs reviewed: CT chest/abd/pelvis - pending EBV, CMV, Toxo - no acute findings on labs available - note IGG + for EBV.  bxc, ucx - no acute findings on labs available stool wbc, o and p, cx, c diff - negative with final culture pending ana - negative cbc with diff, cmp - unremarkable hepatitis b/c, hiv - negative  Advised need to get formal report from Dr. Ninetta Lights.  Needs FMLA paperwork  completed.  Now presents.  Current Allergies (reviewed today): ! PENICILLIN  Past Medical History:    Reviewed history from 04/26/2007 and no changes required:       Severe pneumonia 2003, takes allergy shots       Allergic rhinitis       Depression  Past Surgical History:    Reviewed history from 04/26/2007 and no changes required:       lymph node bx benign - 08/27/2005, T & A - 08/27/2005       LASIK Eye Surgery   Family History:    Reviewed history from 04/26/2007 and no changes required:       Father: 47 Lipids/Glucose problems       Mother: 55 Lipids       Siblings: Sister 27: Healthy except for uterine fibroids         Social History:    Reviewed history from 11/10/2007 and no changes required:       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  Former Smoker       Social ETOH use    Review of Systems      See HPI   Physical Exam  General:     well-developed, well-nourished, and well-hydrated.   Lungs:     normal respiratory effort and normal breath sounds.   Heart:     normal rate, regular rhythm, and no murmur.   Abdomen:     soft, non-tender, and normal bowel sounds.   Extremities:     No clubbing, cyanosis, edema, or deformity noted with normal full range of motion of all joints.   Psych:     Cognition and judgment appear intact. Alert and cooperative with normal attention span and concentration. No apparent delusions, illusions, hallucinations    Impression & Recommendations:  Problem # 1:  FEVER UNSPECIFIED (ICD-780.60) Resolved per patient. Advised work-up underway per ID and appreciate there input. He is to complete work-up as per Dr. Ninetta Lights and advised need for sx Rx only i.e. Tylenol as needed for pain and aches. (2 500 mg by mouth every 6 hours as needed). I encouraged fluid push, daily mvi and rest. Agrees. Await CT after prior auth and keep follow-up with ID on Nov 27 2007 - sooner if sx worsen or  recur.  Problem # 2:  DIARRHEA (ICD-787.91) Persistng. Relief on Pamine and Zegerid as per Dr. Adolph Pollack. Await final report on stool study. Advised neg thus far. Push fluids, gatoraide. Avoid medications that slow down BMs as this can prolong course. Update if unable to east or drink. Edcuated signs of dehydration.  Problem # 3:  NAUSEA AND VOMITING (ICD-787.01) Resolved. Follow.  Problem # 4:  Disposition Advised will complete FMLA paperwork. He is better and feels he would like to try to go back to work. Advised should be fine and will give off until Wednesday. I will complete his FMLA paperwork and we will notify when ready for pick-up. He was quite anxioius after I informed him of this and began expressing concern about what he would do if sx worsen. Advised if he returns to work and sx worsen, to notify ID and [place]. Agrees.   Complete Medication List: 1)  Adderall 20 Mg Tabs (Amphetamine-dextroamphetamine) .... Three times a day 2)  Klonopin 0.5 Mg Tabs (Clonazepam) .... 1/2 at bedtime 3)  Protonix 40 Mg Tbec (Pantoprazole sodium) .... One daily 4)  Astelin 137 Mcg/spray Soln (Azelastine hcl) .... As needed 5)  Lexapro 20 Mg Tabs (Escitalopram oxalate) .... One at bedtime 6)  Promethazine Hcl 25 Mg Tabs (Promethazine hcl) .... One every 6 hours as needed   Patient Instructions: 1)  Please schedule a follow-up appointment in 1 month.    ]

## 2011-01-26 NOTE — Progress Notes (Signed)
Summary: Call to Employer - [name]  Phone Note Outgoing Call   Call placed by: Franchot Heidelberg, MD Call placed to: Boss Summary of Call: Call placed to update patient employer, [name] on clinical condition per patient request. No answer. Left mesage for return call. Initial call taken by: Franchot Heidelberg MD,  November 01, 2007 8:31 AM         Appended Document: Call to Employer - [name] Call placed. Emplyer updated on illness. Thankful for what we are doing for [name]. Advised recheck Monday with anticipated work return on 11.11.08 Agrees.

## 2011-01-26 NOTE — Letter (Signed)
Summary: [facility] [employer]   Imported By: Randon Goldsmith 01/12/2008 12:16:17  _____________________________________________________________________  External Attachment:    Type:   Image     Comment:   External Document

## 2011-01-26 NOTE — Progress Notes (Signed)
Summary: GI referral  Phone Note Outgoing Call   Call placed by: Sonny Dandy,  January 22, 2008 12:38 PM Summary of Call: appointment set for Dr Christella Hartigan 02/05/08 at 215pm, order faxed and patient notified  Initial call taken by: Sonny Dandy,  January 22, 2008 12:39 PM

## 2011-01-26 NOTE — Progress Notes (Signed)
Summary: Virus update  Phone Note Outgoing Call   Call placed by: Sonny Dandy,  November 03, 2007 3:25 PM Summary of Call: called patient, per patient says he is no better n/v/d on 11/02/07 with temp  ~100.5-100.0. Today took 1/2 of a phenergan tab, does not  feel much better. Also state advil is making him sick and the tylenol doesn't help much Initial call taken by: Sonny Dandy,  November 03, 2007 3:28 PM  Follow-up for Phone Call        Noted. Follow-up by: Franchot Heidelberg MD,  November 03, 2007 3:36 PM

## 2011-01-26 NOTE — Progress Notes (Signed)
Summary: Pain Medication  Phone Note Call from Patient   Caller: Patient Summary of Call: [name] [name] is requesting some pain medication. He says that he is still having nausea and pain. He states that the fluids that we gave him made him feel much better, but only for a short time. Initial call taken by: Sherilyn Banker,  November 08, 2007 2:49 PM  Follow-up for Phone Call        Pt with sx suggesting psychogenic cause for "viral ailment". No further meds until seen by ID in am. Follow-up by: Franchot Heidelberg MD,  November 09, 2007 8:10 AM  Additional Follow-up for Phone Call Additional follow up Details #1::        explained to patient that he should not mask sx with additional meds and fluids before he sees provider tomorrow voices understanding. Additional Follow-up by: Sonny Dandy,  November 09, 2007 2:39 PM

## 2011-01-26 NOTE — Progress Notes (Signed)
Summary: Sx improving  Phone Note Call from Patient   Reason for Call: Talk to Nurse Summary of Call: patient called stating that he was told to call in this morning...  please return [phone] Initial call taken by: Donneta Romberg,  January 23, 2008 9:22 AM  Follow-up for Phone Call        patient called,  no nausea/vomiting. Diarrhea has slowed. Still not feeling good Follow-up by: Sonny Dandy,  January 23, 2008 10:05 AM  Additional Follow-up for Phone Call Additional follow up Details #1::        May return to work as sx improving.Cont to push fluids. Additional Follow-up by: Franchot Heidelberg MD,  January 23, 2008 10:13 AM    Additional Follow-up for Phone Call Additional follow up Details #2::    information given on voice mail Follow-up by: Sonny Dandy,  January 23, 2008 10:22 AM

## 2011-01-26 NOTE — Miscellaneous (Signed)
Summary: [place] [facility]  [place] [facility]   Imported By: Florinda Marker 11/27/2010 14:37:02  _____________________________________________________________________  External Attachment:    Type:   Image     Comment:   External Document

## 2011-01-26 NOTE — Progress Notes (Signed)
Summary: 10/16/07 lab results  Phone Note Outgoing Call   Call placed by: Sonny Dandy,  October 17, 2007 11:56 AM Summary of Call: Results given to patient, voices understanding .................................................................Marland KitchenMarland KitchenSonny Dandy  October 17, 2007 11:56 AM   Initial call taken by: Sonny Dandy,  October 17, 2007 11:56 AM

## 2011-01-26 NOTE — Letter (Signed)
Summary: [facility]   Imported By: Donneta Romberg 04/28/2007 16:48:37  _____________________________________________________________________  External Attachment:    Type:   Image     Comment:   External Document

## 2011-01-26 NOTE — Progress Notes (Signed)
Summary: ID referral  Phone Note From Other Clinic   Caller: Referral Coordinator Summary of Call: appointment set for 11/10/07 at 1030am at the infectious Disease Clinic @ [facility]. Patient notified by ID clinic Initial call taken by: Sonny Dandy,  November 06, 2007 2:43 PM  Follow-up for Phone Call        Noted. Follow-up by: Franchot Heidelberg MD,  November 06, 2007 2:48 PM

## 2011-01-26 NOTE — Assessment & Plan Note (Signed)
Summary: new pt fevers,n/v,febrile illness okperjh   PCP:  Franchot Heidelberg, MD  Chief Complaint:  temp diarrhea.  History of Present Illness: 45 yo M with hx of bipoar. has been out of work since 10-16. States he had fevers, up to 102, sweats,  nausea, vomtting. Took doxy for 7 days (?) without change.  began to have diarrhea last week.  Was given percocet last week. has been taking phenergan qid to help with n/v. able to eat toast, tortilla chips,   [facility] lab told him he had a virus that was outside the flu spectrum. influenza a/b-. No new medicines. has been taking klonipin, lexapro, unable to tolerate his other medicines.   no cough, no sinus d/c, wife has sinus d/c and cough and laryngitis. states temps now are staying at 100-100.5. cont to have sweats and chills, no travel, no animal exposure, no blood txf, no tatoos, having loose bm 8x/day,  no LAN, states he did have some joint swelling initially but has since resolved      Current Allergies (reviewed today): ! PENICILLIN   Social History:    Occupation: Holiday representative for IT at [employer]    Married- 1992.     Former Smoker    Social ETOH use    Review of Systems       see HPI   Vital Signs:  Patient Profile:   45 Years Old Male Height:     72 inches (182.88 cm) Weight:      211.3 pounds (96.05 kg) Temp:     98.9 degrees F (37.17 degrees C) oral Pulse rate:   89 / minute BP sitting:   154 / 87  (left arm)  Pt. in pain?   yes    Location:   general    Intensity:   7    Type:       aching  Vitals Entered By: Nyra Capes RN (November 10, 2007 10:37 AM)              Is Patient Diabetic? No Nutritional Status BMI of 19 -24 = normal  Does patient need assistance? Functional Status Self care Ambulation Normal     Physical Exam  General:     well-developed, well-nourished, and well-hydrated.   Eyes:     pupils equal, pupils round, and pupils reactive to light.   Mouth:  pharynx pink and moist and no exudates.   Neck:     no masses.   Lungs:     normal respiratory effort and normal breath sounds.   Heart:     normal rate, regular rhythm, and no murmur.   Abdomen:     soft, non-tender, and normal bowel sounds.   Msk:     no joint swelling and no redness over joints.  in UE or no joint swelling in knee Cervical Nodes:     no anterior cervical adenopathy and no posterior cervical adenopathy.      Impression & Recommendations:  Problem # 1:  FEVER UNSPECIFIED (ICD-780.60) 45 yo male with nearly 1 month of FUO will- check CT chest/abd/pelvis check EBV, CMV, Toxo check bxc, ucx check stool wbc, o and p, cx, c diff check ana cbc with diff, cmp hepatitis b/c, hiv  comment- he does not appear to have risk factors for hiv, hepatitis but given his age these must be on differential. When considering his age and common causes of FUO lymphoma, hematologic malignancy also are high on the list. as  well, his GI sx could suggest chron's or UC but it is unlcear if these predate the anbx he was given and if they are a result of this. also he does not describe blood or mucus in his stool.  will see him back in 1-2 weeks to further eval . if all above test are negative may have GI see him for endoscopies.   Orders: Consultation Level IV (81191) CT with Contrast (CT w/ contrast) T-Comprehensive Metabolic Panel (47829-56213) T-CBC w/Diff (08657-84696) T-CMV IgG Antibody (29528-41324) T-CMV IgM  Antibody ([address]) T-Antinuclear Antib (ANA) (66440-34742) T-Hepatitis C Antibody (59563-87564) T-Hepatitis B Surface Antigen (33295-18841) T-Hepatitis B Surface Antibody (66063-01601) T-Hepatitis B Core Antibody (09323-55732) T-HIV Antibody  (Reflex) (20254-27062) T-Sed Rate (Automated) (37628-31517) T-Rheumatoid Factor [phone]) T-Toxoplasma Antibody IgG (26948-54627) T-Toxoplasma Antibody IgM (03500-93818) T-Culture, Stool (29937-16967) T-Culture, Urine  (89381-01751) T-Culture, Blood Routine (02585-27782) T-Culture, Giardia / Cryptosporidium (42353-61443) T-Epstein Barr Virus Antibody Panel I (15400-86761) T- * Misc. Laboratory test [phone])   Other Orders: T-Urinalysis (26712-45809)     ]

## 2011-01-26 NOTE — Letter (Signed)
Summary: Out of Work  [facility] [employer]  [address]   [place], [place] [address]   Phone: [phone]  Fax: [phone]    October 17, 2007   Employee:  Selah D Paulette    To Whom It May Concern:   For Medical reasons, please excuse the above named employee from work for the following dates:  Start:   10/12/07  End:   10/20/07  If you need additional information, please feel free to contact our office.         Sincerely,       Franchot Heidelberg MD

## 2011-01-26 NOTE — Letter (Signed)
Summary: Out of Work  [facility]  [address]   [place], [place] [address]   Phone: [phone]  Fax: [phone]    October 31, 2007   Employee:  [name] [name]    To Whom It May Concern:   For Medical reasons, please excuse the above named employee from work for the following dates:  Start:   10/31/07  End:   11/10/8    If you need additional information, please feel free to contact our office.   Sincerely,        Franchot Heidelberg MD

## 2011-01-26 NOTE — Progress Notes (Signed)
Summary: Unable to drive  Phone Note Call from Patient   Caller: Patient Complaint: Headache Summary of Call: Pt unable to go to work this morning.  HR states needing something to du with disability.  Dr. Erby Pian talked to HR on Friday and stated that he would be back to work but he is unable to drive to work. Initial call taken by: Altamease Oiler,  November 06, 2007 8:25 AM  Follow-up for Phone Call        Needs appt. Follow-up by: Franchot Heidelberg MD,  November 06, 2007 8:59 AM

## 2011-01-29 NOTE — Letter (Signed)
Summary: Records Request  Records Request   Imported By: Lutricia Horsfall 08/23/2008 10:45:09  _____________________________________________________________________  External Attachment:    Type:   Image     Comment:   External Document

## 2011-03-08 LAB — DIFFERENTIAL
Basophils Absolute: 0 K/uL (ref 0.0–0.1)
Basophils Relative: 0 % (ref 0–1)
Eosinophils Absolute: 0.3 K/uL (ref 0.0–0.7)
Eosinophils Relative: 3 % (ref 0–5)
Lymphocytes Relative: 22 % (ref 12–46)
Lymphs Abs: 2.8 K/uL (ref 0.7–4.0)
Monocytes Absolute: 0.7 K/uL (ref 0.1–1.0)
Monocytes Relative: 5 % (ref 3–12)
Neutro Abs: 8.8 K/uL — ABNORMAL HIGH (ref 1.7–7.7)
Neutrophils Relative %: 70 % (ref 43–77)

## 2011-03-08 LAB — BASIC METABOLIC PANEL WITH GFR
BUN: 14 mg/dL (ref 6–23)
CO2: 30 meq/L (ref 19–32)
Calcium: 9.4 mg/dL (ref 8.4–10.5)
Chloride: 101 meq/L (ref 96–112)
Creatinine, Ser: 1.17 mg/dL (ref 0.4–1.5)
GFR calc non Af Amer: >60 mL/min
Glucose, Bld: 97 mg/dL (ref 70–99)
Potassium: 4.3 meq/L (ref 3.5–5.1)
Sodium: 138 meq/L (ref 135–145)

## 2011-03-08 LAB — RAPID STREP SCREEN (MED CTR MEBANE ONLY): Streptococcus, Group A Screen (Direct): NEGATIVE

## 2011-03-08 LAB — CBC
HCT: 41.2 % (ref 39.0–52.0)
Hemoglobin: 15.1 g/dL (ref 13.0–17.0)
MCH: 30 pg (ref 26.0–34.0)
MCHC: 36.7 g/dL — ABNORMAL HIGH (ref 30.0–36.0)
MCV: 81.9 fL (ref 78.0–100.0)
Platelets: 224 10*3/uL (ref 150–400)
RBC: 5.03 MIL/uL (ref 4.22–5.81)
RDW: 12.4 % (ref 11.5–15.5)
WBC: 12.6 10*3/uL — ABNORMAL HIGH (ref 4.0–10.5)

## 2011-03-08 LAB — ETHANOL

## 2011-03-11 LAB — CBC
HCT: 39 % (ref 39.0–52.0)
Hemoglobin: 13.6 g/dL (ref 13.0–17.0)
MCH: 30.9 pg (ref 26.0–34.0)
MCHC: 34.8 g/dL (ref 30.0–36.0)
MCV: 88.9 fL (ref 78.0–100.0)
Platelets: 226 10*3/uL (ref 150–400)
RBC: 4.38 MIL/uL (ref 4.22–5.81)
RDW: 13.8 % (ref 11.5–15.5)
WBC: 11.5 10*3/uL — ABNORMAL HIGH (ref 4.0–10.5)

## 2011-03-11 LAB — BASIC METABOLIC PANEL
BUN: 11 mg/dL (ref 6–23)
CO2: 28 mEq/L (ref 19–32)
Calcium: 8.9 mg/dL (ref 8.4–10.5)
Chloride: 103 mEq/L (ref 96–112)
Creatinine, Ser: 1.02 mg/dL (ref 0.4–1.5)
GFR calc Af Amer: >60 mL/min (ref >60)
GFR calc non Af Amer: >60 mL/min (ref >60)
Glucose, Bld: 90 mg/dL (ref 70–99)
Potassium: 3.2 mEq/L — ABNORMAL LOW (ref 3.5–5.1)
Sodium: 139 mEq/L (ref 135–145)

## 2011-03-11 LAB — RAPID URINE DRUG SCREEN, HOSP PERFORMED
Amphetamines: POSITIVE — AB
Barbiturates: NOT DETECTED
Benzodiazepines: POSITIVE — AB
Cocaine: POSITIVE — AB
Opiates: NOT DETECTED
Tetrahydrocannabinol: NOT DETECTED

## 2011-03-11 LAB — URINALYSIS, ROUTINE W REFLEX MICROSCOPIC
Bilirubin Urine: NEGATIVE
Glucose, UA: NEGATIVE mg/dL
Hgb urine dipstick: NEGATIVE
Ketones, ur: NEGATIVE mg/dL
Nitrite: NEGATIVE
Protein, ur: NEGATIVE mg/dL
Specific Gravity, Urine: 1.01 (ref 1.005–1.030)
Urobilinogen, UA: 0.2 mg/dL (ref 0.0–1.0)
pH: 6 (ref 5.0–8.0)

## 2011-03-11 LAB — DIFFERENTIAL
Basophils Absolute: 0 10*3/uL (ref 0.0–0.1)
Basophils Relative: 0 % (ref 0–1)
Eosinophils Absolute: 2.6 10*3/uL — ABNORMAL HIGH (ref 0.0–0.7)
Eosinophils Relative: 23 % — ABNORMAL HIGH (ref 0–5)
Lymphocytes Relative: 20 % (ref 12–46)
Lymphs Abs: 2.2 10*3/uL (ref 0.7–4.0)
Monocytes Absolute: 0.4 10*3/uL (ref 0.1–1.0)
Monocytes Relative: 4 % (ref 3–12)
Neutro Abs: 6.2 10*3/uL (ref 1.7–7.7)
Neutrophils Relative %: 54 % (ref 43–77)

## 2011-03-11 LAB — ETHANOL: Alcohol, Ethyl (B): <5 mg/dL (ref 0–10)

## 2011-05-11 NOTE — Assessment & Plan Note (Signed)
[facility]                         GASTROENTEROLOGY OFFICE NOTE   NAME:[name], [name] HURLBUT                         MRN:          [id number]  DATE:12/27/2007                            DOB:          1966/02/16    PRIMARY CARE PHYSICIAN:  Dr. Franchot Heidelberg.   GI PROBLEM LIST:  1. Likely IBS.  Previous patient of Dr. Victorino Dike.  2. Gastroesophageal reflux disease with a large, 6 cm hiatal hernia,      confirmed by upper GI barium study, November 2004.  3. Acute diarrheal illness, October to November of 2008.  Had fevers      and was evaluated by Dr. Johny Sax at the ID clinic at [facility] with a battery of stool testing and serologic testing, all of      which was negative.   INTERVAL HISTORY:  [name] [name] was last seen here in GI in 2006, by Dr.  Victorino Dike.  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  He had a pretty serious nausea and diarrhea illness, lasting  approximately one month's time.  He had large workup through infectious  disease, including multiple blood and stool tests, all of which was  negative, including a negative sed rate, normal white count, normal  blood cultures, normal stool testing.  His symptoms eventually resolved.  He is now somewhat constipated.  He has to strain to move his bowels and  he moves them every other day.   CURRENT MEDICATIONS:  Adderall and Lexapro.   PHYSICAL EXAM:  Weight 210 pounds, blood pressure 150/98, pulse is 100.  CONSTITUTIONAL:  Generally well-appearing.  ABDOMEN:  Soft, nontender, nondistended.  Normal bowel sounds.   ASSESSMENT AND PLAN:  Patient is a 45 year old man with recent acute  febrile, diarrheal, nausea illness.  Symptoms have completely resolved.  In fact, he is fairly constipated now.  He may have had a viral  gastroenteritis.  C. difficile is also possible.  He was on antibiotics  two to  three months prior to the onset of his symptoms and it is not  uncommon for people to have C. difficile infection, even though C.  difficile toxin testing in the stool is negative.  In either case, he is  back to his baseline and I do not think doing any invasive testing, such  as colonoscopy, is warranted at this point.  I will put him on fiber  supplements, since he is fairly constipated now.  He will return to see me in two to three months.  He knows that, if he  has return of his nausea and diarrhea, to get back in touch and, at that  point, I would want to proceed with colonoscopy.     Rachael Fee, MD  Electronically Signed    DPJ/MedQ  DD: 12/27/2007  DT: 12/27/2007  Job #: [id number]   cc:   Lacretia Leigh. Ninetta Lights, M.D.  Franchot Heidelberg, M.D.

## 2011-05-14 NOTE — Op Note (Signed)
. [facility]  Patient:    [name] [name], [name] [name] Visit Number: [id number] MRN: [id number]          Service Type: DSU Location: [facility] Attending Physician:  Cornell Barman Dictated by:   Lenard Galloway Chaney Malling, M.D. Proc. Date: 11/14/01 Admit Date:  11/14/2001                             Operative Report  PREOPERATIVE DIAGNOSIS:  Internal derangement, left knee.  POSTOPERATIVE DIAGNOSES: 1. Small plica, left knee. 2. Traumatic chondromalacia of medial patellar facet, left knee.  PROCEDURES: 1. Arthroscopy, left knee. 2. Debride small medial plica, left knee. 3. Chondroplasty of medial patellar facet, left knee.  ANESTHESIA:  MAC.  SURGEON:  Lenard Galloway. Chaney Malling, M.D.  PATHOLOGY:  With the arthroscope in the knee, a very careful examination of both compartments undertaken.  The articular cartilage of both femoral condyles, both tibial plateaus, and the entire circumference of both medial and lateral meniscus were examined extremely carefully and no tears, no abnormalities or other problems were seen.  There was some low-grade synovitis adjacent to the anterior cruciate ligament, which was floating into the medial compartment.  This was not particularly indurated.  The ACL was completely intact, and this was probed and was quite stable.  Both medial and lateral gutters were then evaluated.  There was a small medial plica, but this was not very thick.  The arthroscope was then passed into the patellofemoral notch area.  The notch area appeared absolutely normal with no articular cartilage loss or damage.  the entire posterior aspect of the patella was examined very carefully, and this was absolutely normal except that over the medial patellar facet there was an area where there was some fraying and tearing of the articular cartilage.  DESCRIPTION OF PROCEDURE:  The patient was placed on the operating table in the supine position with a pneumatic  tourniquet on the left upper thigh.  The left leg was placed in a leg holder and the entire left lower extremity prepped with Duraprep and draped out in the usual manner.  An infusion cannula was placed in the superomedial pouch and the knee distended with saline. Anteromedial and anterolateral portals were made, and the arthroscope was introduced.  A small medial plica was debrided with the chondroplastic shaver. There was a slight amount of synovitis adjacent to the ACL and the medial femoral condyle.  This was debrided.  The arthroscope was then passed up into the femoral notch area.  The medial patellar facet was frayed and torn, and this was debrided with a chondroplastic shaver through both medial and lateral portals.  Excellent decompression of this torn portion of the patella was achieved, very nicely.  This was actually a fairly small area, and after it was smoothed off it appeared fairly normal.  The cartilage was still present covering the bone.  No other significant pathology was seen in the knee.  The knee was then infiltrated with Marcaine and a large bulky pressure dressing was applied.  The patient then returned to the recovery room in excellent condition.  Technically this procedure went extremely well.  FOLLOW-UP CARE: 1. See me in my office next week. 2. Mepergan Fortis for pain. Dictated by:   Lenard Galloway Chaney Malling, M.D. Attending Physician:  Cornell Barman DD:  11/14/01 TD:  11/14/01 Job: [id number]

## 2011-07-08 ENCOUNTER — Encounter: Payer: Self-pay | Admitting: *Deleted

## 2011-07-08 ENCOUNTER — Emergency Department (HOSPITAL_COMMUNITY)
Admission: EM | Admit: 2011-07-08 | Discharge: 2011-07-08 | Disposition: A | Discharge status: Home / Self Care | Payer: Medicaid Other | Attending: Emergency Medicine | Admitting: Emergency Medicine

## 2011-07-08 DIAGNOSIS — IMO0001 Reserved for inherently not codable concepts without codable children: Secondary | ICD-10-CM | POA: Insufficient documentation

## 2011-07-08 DIAGNOSIS — F411 Generalized anxiety disorder: Secondary | ICD-10-CM | POA: Insufficient documentation

## 2011-07-08 DIAGNOSIS — S61219A Laceration without foreign body of unspecified finger without damage to nail, initial encounter: Secondary | ICD-10-CM

## 2011-07-08 DIAGNOSIS — S61209A Unspecified open wound of unspecified finger without damage to nail, initial encounter: Secondary | ICD-10-CM | POA: Insufficient documentation

## 2011-07-08 DIAGNOSIS — W298XXA Contact with other powered powered hand tools and household machinery, initial encounter: Secondary | ICD-10-CM | POA: Insufficient documentation

## 2011-07-08 MED ORDER — LIDOCAINE HCL (PF) 1 % IJ SOLN
INTRAMUSCULAR | Status: AC
  Administered 2011-07-08: 15:00:00
  Filled 2011-07-08: qty 5

## 2011-07-08 NOTE — ED Notes (Signed)
PA at bedside repairing lac

## 2011-07-08 NOTE — ED Provider Notes (Addendum)
History     Chief Complaint  Patient presents with  . Extremity Laceration   HPI Comments: Pt was cutting a limb when he was stung by yellow jackets and cut his left index finger. Tetanus up to date.  Patient is a 45 y.o. male presenting with skin laceration. The history is provided by the patient.  Laceration  The incident occurred 1 to 2 hours ago. The laceration is located on the left hand (index finger). The laceration is 3 cm in size. The laceration mechanism was a a dirty knife. The pain is moderate. The pain has been constant since onset. He reports no foreign bodies present. His tetanus status is UTD.    History reviewed. No pertinent past medical history.  Past Surgical History  Procedure Date  . Tonsillectomy     History reviewed. No pertinent family history.  History  Substance Use Topics  . Smoking status: Never Smoker   . Smokeless tobacco: Not on file  . Alcohol Use: Yes     occasional      Review of Systems  Constitutional: Negative for activity change.       All ROS Neg except as noted in HPI  HENT: Negative for nosebleeds and neck pain.   Eyes: Negative for photophobia and discharge.  Respiratory: Negative for cough, shortness of breath and wheezing.   Cardiovascular: Negative for chest pain and palpitations.  Gastrointestinal: Negative for abdominal pain and blood in stool.  Genitourinary: Negative for dysuria, frequency and hematuria.  Musculoskeletal: Positive for myalgias and arthralgias. Negative for back pain.  Skin: Negative.   Neurological: Negative for dizziness, seizures and speech difficulty.  Psychiatric/Behavioral: Negative for hallucinations and confusion. The patient is nervous/anxious.     Physical Exam  BP 130/93  Pulse 88  Temp(Src) 99.2 F (37.3 C) (Oral)  Resp 18  Ht 5\' 11"  (1.803 m)  Wt 219 lb (99.338 kg)  BMI 30.54 kg/m2  SpO2 98%  Physical Exam  Nursing note and vitals reviewed. Constitutional: He is oriented to  person, place, and time. He appears well-developed and well-nourished.  Non-toxic appearance.  HENT:  Head: Normocephalic.  Right Ear: Tympanic membrane and external ear normal.  Left Ear: Tympanic membrane and external ear normal.  Eyes: EOM and lids are normal. Pupils are equal, round, and reactive to light.  Neck: Normal range of motion. Neck supple. Carotid bruit is not present.  Cardiovascular: Normal rate, regular rhythm, normal heart sounds, intact distal pulses and normal pulses.   Pulmonary/Chest: Breath sounds normal. No respiratory distress.  Abdominal: Soft. Bowel sounds are normal. There is no tenderness. There is no guarding.  Musculoskeletal: Normal range of motion.       Lacetation to the dorsum of the left index finger. No bone or tendon involvement. Sensory wnl.  Lymphadenopathy:       Head (right side): No submandibular adenopathy present.       Head (left side): No submandibular adenopathy present.    He has no cervical adenopathy.  Neurological: He is alert and oriented to person, place, and time. He has normal strength. No cranial nerve deficit or sensory deficit.  Skin: Skin is warm and dry.  Psychiatric: He has a normal mood and affect. His speech is normal.    ED Course  LACERATION REPAIR Date/Time: 07/08/2011 3:29 PM Performed by: Kathie Dike Authorized by: Kathie Dike Consent: Verbal consent obtained. Patient identity confirmed: arm band Time out: Immediately prior to procedure a "time out" was called  to verify the correct patient, procedure, equipment, support staff and site/side marked as required. Body area: upper extremity Location details: left index finger Laceration length: 2.6 cm Foreign bodies: no foreign bodies Tendon involvement: none Nerve involvement: none Vascular damage: no Anesthesia: local infiltration Local anesthetic: lidocaine 1% without epinephrine Patient sedated: no Preparation: Patient was prepped and draped in the  usual sterile fashion. Irrigation solution: saline Amount of cleaning: standard Debridement: none Degree of undermining: none Skin closure: 4-0 nylon Technique: simple Approximation: close Approximation difficulty: simple Dressing: 4x4 sterile gauze and gauze roll Patient tolerance: Patient tolerated the procedure well with no immediate complications. Comments: Dressing applied by nursing staff. Tetanus up to date.    MDM I have reviewed nursing notes, vital signs, and all appropriate lab and imaging results for this patient.  Medical screening examination/treatment/procedure(s) were performed by non-physician practitioner and as supervising physician I was immediately available for consultation/collaboration.  Kathie Dike, [place] 07/08/11 1550  Toy Baker, MD 07/13/11 1610  Toy Baker, MD 02/14/12 [phone]

## 2011-07-08 NOTE — ED Notes (Signed)
States was cutting bushes with hand saw and stepped in yellow jacket nest and cut right index finger with saw.  Pt also got multiple stings to bil lower extremities.  Bleeding controlled in triage with pressure dressing.  ROM intact.

## 2011-10-06 LAB — DIFFERENTIAL
Basophils Absolute: 0.1
Basophils Relative: 0
Eosinophils Absolute: 0.2
Eosinophils Relative: 1
Lymphocytes Relative: 20
Lymphs Abs: 2.7
Monocytes Absolute: 0.6
Monocytes Relative: 5
Neutro Abs: 9.9 — ABNORMAL HIGH
Neutrophils Relative %: 74

## 2011-10-06 LAB — CBC
HCT: 44.7
Hemoglobin: 15.8
MCHC: 35.3
MCV: 86.3
Platelets: 228
RBC: 5.18
RDW: 13.3
WBC: 13.4 — ABNORMAL HIGH

## 2011-10-06 LAB — BASIC METABOLIC PANEL
BUN: 6
CO2: 31
Calcium: 8.3 — ABNORMAL LOW
Chloride: 100
Creatinine, Ser: 1.02
GFR calc Af Amer: >60
GFR calc non Af Amer: >60
Glucose, Bld: 100 — ABNORMAL HIGH
Potassium: 3.9
Sodium: 136

## 2011-10-06 LAB — INFLUENZA A+B VIRUS AG-DIRECT(RAPID)
Inflenza A Ag: NEGATIVE
Influenza B Ag: NEGATIVE

## 2014-11-26 ENCOUNTER — Emergency Department (HOSPITAL_COMMUNITY): Payer: No Typology Code available for payment source

## 2014-11-26 ENCOUNTER — Emergency Department (HOSPITAL_COMMUNITY)
Admission: EM | Admit: 2014-11-26 | Discharge: 2014-11-26 | Disposition: A | Discharge status: Home / Self Care | Payer: No Typology Code available for payment source | Attending: Emergency Medicine | Admitting: Emergency Medicine

## 2014-11-26 ENCOUNTER — Encounter (HOSPITAL_COMMUNITY): Payer: Self-pay | Admitting: Emergency Medicine

## 2014-11-26 DIAGNOSIS — S3992XA Unspecified injury of lower back, initial encounter: Secondary | ICD-10-CM | POA: Diagnosis not present

## 2014-11-26 DIAGNOSIS — S0993XA Unspecified injury of face, initial encounter: Secondary | ICD-10-CM | POA: Diagnosis not present

## 2014-11-26 DIAGNOSIS — Y9389 Activity, other specified: Secondary | ICD-10-CM | POA: Insufficient documentation

## 2014-11-26 DIAGNOSIS — S3991XA Unspecified injury of abdomen, initial encounter: Secondary | ICD-10-CM | POA: Insufficient documentation

## 2014-11-26 DIAGNOSIS — Y92410 Unspecified street and highway as the place of occurrence of the external cause: Secondary | ICD-10-CM | POA: Insufficient documentation

## 2014-11-26 DIAGNOSIS — Z880 Allergy status to penicillin: Secondary | ICD-10-CM | POA: Diagnosis not present

## 2014-11-26 DIAGNOSIS — Y998 Other external cause status: Secondary | ICD-10-CM | POA: Diagnosis not present

## 2014-11-26 DIAGNOSIS — F419 Anxiety disorder, unspecified: Secondary | ICD-10-CM | POA: Diagnosis not present

## 2014-11-26 DIAGNOSIS — M545 Low back pain, unspecified: Secondary | ICD-10-CM

## 2014-11-26 DIAGNOSIS — Z79899 Other long term (current) drug therapy: Secondary | ICD-10-CM | POA: Diagnosis not present

## 2014-11-26 DIAGNOSIS — S299XXA Unspecified injury of thorax, initial encounter: Secondary | ICD-10-CM | POA: Insufficient documentation

## 2014-11-26 HISTORY — DX: Anxiety disorder, unspecified: F41.9

## 2014-11-26 LAB — I-STAT CHEM 8, ED
BUN: 8 mg/dL (ref 6–23)
Calcium, Ion: 1.17 mmol/L (ref 1.12–1.23)
Chloride: 103 mEq/L (ref 96–112)
Creatinine, Ser: 1.2 mg/dL (ref 0.50–1.35)
Glucose, Bld: 90 mg/dL (ref 70–99)
HCT: 41 % (ref 39.0–52.0)
Hemoglobin: 13.9 g/dL (ref 13.0–17.0)
Potassium: 3.9 mEq/L (ref 3.7–5.3)
Sodium: 138 mEq/L (ref 137–147)
TCO2: 25 mmol/L (ref 0–100)

## 2014-11-26 MED ORDER — OXYCODONE-ACETAMINOPHEN 5-325 MG PO TABS: 1.0000 | ORAL_TABLET | Freq: Four times a day (QID) | ORAL | Status: DC | PRN

## 2014-11-26 MED ORDER — IOHEXOL 300 MG/ML  SOLN
100.0000 mL | Freq: Once | INTRAMUSCULAR | Status: AC | PRN
  Administered 2014-11-26: 100 mL via INTRAVENOUS

## 2014-11-26 MED ORDER — CYCLOBENZAPRINE HCL 10 MG PO TABS: 10.0000 mg | ORAL_TABLET | Freq: Three times a day (TID) | ORAL | Status: DC | PRN

## 2014-11-26 NOTE — Discharge Instructions (Signed)
Follow up with your md next week for recheck °
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
## 2014-12-17 ENCOUNTER — Ambulatory Visit (HOSPITAL_COMMUNITY): Payer: No Typology Code available for payment source | Admitting: Physical Therapy

## 2015-01-01 ENCOUNTER — Ambulatory Visit (HOSPITAL_COMMUNITY)
Admission: RE | Admit: 2015-01-01 | Discharge: 2015-01-01 | Disposition: A | Discharge status: Home / Self Care | Payer: No Typology Code available for payment source | Source: Ambulatory Visit | Attending: Family Medicine | Admitting: Family Medicine

## 2015-01-01 DIAGNOSIS — M546 Pain in thoracic spine: Secondary | ICD-10-CM | POA: Diagnosis not present

## 2015-01-01 DIAGNOSIS — M542 Cervicalgia: Secondary | ICD-10-CM | POA: Diagnosis not present

## 2015-01-01 DIAGNOSIS — M2560 Stiffness of unspecified joint, not elsewhere classified: Secondary | ICD-10-CM

## 2015-01-01 DIAGNOSIS — M545 Low back pain: Secondary | ICD-10-CM | POA: Insufficient documentation

## 2015-01-01 DIAGNOSIS — R6889 Other general symptoms and signs: Secondary | ICD-10-CM

## 2015-01-01 DIAGNOSIS — R52 Pain, unspecified: Secondary | ICD-10-CM
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
## 2015-01-03 ENCOUNTER — Ambulatory Visit (HOSPITAL_COMMUNITY)
Admission: RE | Admit: 2015-01-03 | Discharge: 2015-01-03 | Disposition: A | Discharge status: Home / Self Care | Payer: No Typology Code available for payment source | Source: Ambulatory Visit | Attending: Family Medicine | Admitting: Family Medicine

## 2015-01-03 DIAGNOSIS — M542 Cervicalgia: Secondary | ICD-10-CM

## 2015-01-03 DIAGNOSIS — M2560 Stiffness of unspecified joint, not elsewhere classified: Secondary | ICD-10-CM

## 2015-01-03 DIAGNOSIS — M545 Low back pain: Secondary | ICD-10-CM | POA: Diagnosis not present

## 2015-01-03 DIAGNOSIS — M546 Pain in thoracic spine: Secondary | ICD-10-CM

## 2015-01-03 DIAGNOSIS — R52 Pain, unspecified: Secondary | ICD-10-CM

## 2015-01-03 DIAGNOSIS — R6889 Other general symptoms and signs: Secondary | ICD-10-CM
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
## 2015-01-07 ENCOUNTER — Ambulatory Visit (HOSPITAL_COMMUNITY)
Admission: RE | Admit: 2015-01-07 | Discharge: 2015-01-07 | Disposition: A | Discharge status: Home / Self Care | Payer: No Typology Code available for payment source | Source: Ambulatory Visit | Attending: Family Medicine | Admitting: Family Medicine

## 2015-01-07 DIAGNOSIS — M546 Pain in thoracic spine: Secondary | ICD-10-CM

## 2015-01-07 DIAGNOSIS — M2560 Stiffness of unspecified joint, not elsewhere classified: Secondary | ICD-10-CM

## 2015-01-07 DIAGNOSIS — R52 Pain, unspecified: Secondary | ICD-10-CM

## 2015-01-07 DIAGNOSIS — M545 Low back pain: Secondary | ICD-10-CM | POA: Diagnosis not present

## 2015-01-07 DIAGNOSIS — R6889 Other general symptoms and signs: Secondary | ICD-10-CM

## 2015-01-07 DIAGNOSIS — M542 Cervicalgia: Secondary | ICD-10-CM

## 2015-01-07 NOTE — Patient Instructions (Signed)
Hip Extension    Lie on back, legs in air, knees bent. Grasp hands behind one thigh and cross other leg over same thigh. Hold 30 seconds. Repeat with other leg held. Repeat 3 times. Do 2 sessions per day.  Copyright  VHI. All rights reserved.   
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
## 2015-01-09 ENCOUNTER — Ambulatory Visit (HOSPITAL_COMMUNITY)
Admission: RE | Admit: 2015-01-09 | Discharge: 2015-01-09 | Disposition: A | Discharge status: Home / Self Care | Payer: No Typology Code available for payment source | Source: Ambulatory Visit | Attending: Family Medicine | Admitting: Family Medicine

## 2015-01-09 DIAGNOSIS — M546 Pain in thoracic spine: Secondary | ICD-10-CM

## 2015-01-09 DIAGNOSIS — R52 Pain, unspecified: Secondary | ICD-10-CM

## 2015-01-09 DIAGNOSIS — M542 Cervicalgia: Secondary | ICD-10-CM

## 2015-01-09 DIAGNOSIS — M545 Low back pain: Secondary | ICD-10-CM | POA: Diagnosis not present

## 2015-01-09 DIAGNOSIS — M2560 Stiffness of unspecified joint, not elsewhere classified: Secondary | ICD-10-CM

## 2015-01-09 DIAGNOSIS — R6889 Other general symptoms and signs: Secondary | ICD-10-CM
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
## 2015-01-14 ENCOUNTER — Ambulatory Visit (HOSPITAL_COMMUNITY): Payer: No Typology Code available for payment source | Admitting: Physical Therapy

## 2015-01-16 ENCOUNTER — Ambulatory Visit (HOSPITAL_COMMUNITY)
Admission: RE | Admit: 2015-01-16 | Discharge: 2015-01-16 | Disposition: A | Discharge status: Home / Self Care | Payer: No Typology Code available for payment source | Source: Ambulatory Visit | Attending: Family Medicine | Admitting: Family Medicine

## 2015-01-16 DIAGNOSIS — M2560 Stiffness of unspecified joint, not elsewhere classified: Secondary | ICD-10-CM

## 2015-01-16 DIAGNOSIS — M545 Low back pain: Secondary | ICD-10-CM | POA: Diagnosis not present

## 2015-01-16 DIAGNOSIS — R6889 Other general symptoms and signs: Secondary | ICD-10-CM

## 2015-01-16 DIAGNOSIS — M546 Pain in thoracic spine: Secondary | ICD-10-CM

## 2015-01-16 DIAGNOSIS — R52 Pain, unspecified: Secondary | ICD-10-CM
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
## 2015-01-16 NOTE — Addendum Note (Signed)
Encounter addended by: Doyne Keelash R Khalif Stender, PT on: 01/16/2015  9:13 AM<BR>     Documentation filed: Letters

## 2015-01-21 ENCOUNTER — Ambulatory Visit (HOSPITAL_COMMUNITY)
Admission: RE | Admit: 2015-01-21 | Discharge: 2015-01-21 | Disposition: A | Discharge status: Home / Self Care | Payer: No Typology Code available for payment source | Source: Ambulatory Visit | Attending: Family Medicine | Admitting: Family Medicine

## 2015-01-21 DIAGNOSIS — M545 Low back pain: Secondary | ICD-10-CM

## 2015-01-21 DIAGNOSIS — M542 Cervicalgia: Secondary | ICD-10-CM

## 2015-01-21 DIAGNOSIS — M546 Pain in thoracic spine: Secondary | ICD-10-CM

## 2015-01-21 DIAGNOSIS — R6889 Other general symptoms and signs: Secondary | ICD-10-CM

## 2015-01-21 DIAGNOSIS — R52 Pain, unspecified: Secondary | ICD-10-CM

## 2015-01-21 DIAGNOSIS — M2560 Stiffness of unspecified joint, not elsewhere classified: Secondary | ICD-10-CM
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
## 2015-01-21 NOTE — Patient Instructions (Signed)
Resistive Band Rowing    With resistive band anchored in door, grasp both ends. Keeping elbows bent, pull back, squeezing shoulder blades together. Hold __1-2__ seconds.  Repeat _10-15__ times. Do __1-2 sets.  Complete 4 times per week.    Scapular Retraction: Rowing (Eccentric) - Arms - 90 Degrees (Resistance Band)   Hold end of band in each hand. Pull back until elbows are even with trunk, and out from sides at 90, thumbs in. Hold for 1-2 seconds.  Repeat 10-15 times. Do 1-2 sets.  Complete 4 times per week.     EXTENSION: Standing - Resistance Band: Stable (Active)   Stand, right arm at side. Against resistance band, draw arm backward, as far as possible, keeping elbow straight.  Repeat 10-15 times. Do 1-2 sets.  Complete 4 times per week.

## 2015-01-23 ENCOUNTER — Encounter (HOSPITAL_COMMUNITY): Payer: Self-pay | Admitting: Physical Therapy

## 2015-01-23 ENCOUNTER — Ambulatory Visit (HOSPITAL_COMMUNITY)
Admission: RE | Admit: 2015-01-23 | Discharge: 2015-01-23 | Disposition: A | Discharge status: Home / Self Care | Payer: No Typology Code available for payment source | Source: Ambulatory Visit | Attending: Family Medicine | Admitting: Family Medicine

## 2015-01-23 DIAGNOSIS — R52 Pain, unspecified: Secondary | ICD-10-CM

## 2015-01-23 DIAGNOSIS — M546 Pain in thoracic spine: Secondary | ICD-10-CM

## 2015-01-23 DIAGNOSIS — M545 Low back pain: Secondary | ICD-10-CM | POA: Diagnosis not present

## 2015-01-23 DIAGNOSIS — R6889 Other general symptoms and signs: Secondary | ICD-10-CM

## 2015-01-23 DIAGNOSIS — M2560 Stiffness of unspecified joint, not elsewhere classified: Secondary | ICD-10-CM

## 2015-01-23 DIAGNOSIS — M542 Cervicalgia: Secondary | ICD-10-CM
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
## 2015-01-27 ENCOUNTER — Telehealth (HOSPITAL_COMMUNITY): Payer: Self-pay | Admitting: Physical Therapy

## 2015-01-27 ENCOUNTER — Ambulatory Visit (HOSPITAL_COMMUNITY): Payer: No Typology Code available for payment source | Admitting: Physical Therapy

## 2015-01-27 NOTE — Telephone Encounter (Signed)
He is sick today and can not come in °

## 2015-01-28 ENCOUNTER — Ambulatory Visit (HOSPITAL_COMMUNITY): Payer: No Typology Code available for payment source

## 2015-01-30 ENCOUNTER — Ambulatory Visit (HOSPITAL_COMMUNITY): Payer: No Typology Code available for payment source | Admitting: Physical Therapy

## 2015-02-04 ENCOUNTER — Ambulatory Visit (HOSPITAL_COMMUNITY): Payer: No Typology Code available for payment source

## 2015-02-06 ENCOUNTER — Ambulatory Visit (HOSPITAL_COMMUNITY)
Admission: RE | Admit: 2015-02-06 | Payer: No Typology Code available for payment source | Source: Ambulatory Visit | Admitting: Physical Therapy

## 2015-02-06 ENCOUNTER — Encounter (HOSPITAL_COMMUNITY): Payer: Self-pay | Admitting: Physical Therapy

## 2015-02-06 NOTE — Therapy (Signed)
[facility] [address] [place], [place], [address] Phone: [phone]   Fax:  [phone]  Patient Details  Name: [name] MaxinMark D Landrigan MRN: [id number] Date of Birth: 01/10/1966 Referring Provider:  No ref. provider found  Encounter Date: 02/06/2015  Patient called and states "I'm still sick along with my whole family. I hope to be better by Tuesday for my next appointment."  Keidan Aumiller R 02/06/2015, 8:21 AM  [facility] [address] , [place], [address] Phone: [phone]   Fax:  [phone]

## 2015-02-11 ENCOUNTER — Ambulatory Visit (HOSPITAL_COMMUNITY)
Admission: RE | Admit: 2015-02-11 | Discharge: 2015-02-11 | Disposition: A | Discharge status: Home / Self Care | Payer: No Typology Code available for payment source | Source: Ambulatory Visit | Attending: Family Medicine | Admitting: Family Medicine

## 2015-02-11 DIAGNOSIS — M542 Cervicalgia: Secondary | ICD-10-CM | POA: Insufficient documentation

## 2015-02-11 DIAGNOSIS — R6889 Other general symptoms and signs: Secondary | ICD-10-CM

## 2015-02-11 DIAGNOSIS — M545 Low back pain: Secondary | ICD-10-CM | POA: Diagnosis not present

## 2015-02-11 DIAGNOSIS — M546 Pain in thoracic spine: Secondary | ICD-10-CM | POA: Diagnosis not present

## 2015-02-11 DIAGNOSIS — R52 Pain, unspecified: Secondary | ICD-10-CM

## 2015-02-11 DIAGNOSIS — M2560 Stiffness of unspecified joint, not elsewhere classified: Secondary | ICD-10-CM
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
## 2015-02-13 ENCOUNTER — Encounter (HOSPITAL_COMMUNITY): Payer: Medicare HMO | Admitting: Physical Therapy

## 2015-02-18 ENCOUNTER — Ambulatory Visit (HOSPITAL_COMMUNITY): Payer: No Typology Code available for payment source

## 2015-02-18 DIAGNOSIS — M545 Low back pain: Secondary | ICD-10-CM

## 2015-02-18 DIAGNOSIS — M2560 Stiffness of unspecified joint, not elsewhere classified: Secondary | ICD-10-CM

## 2015-02-18 DIAGNOSIS — R52 Pain, unspecified: Secondary | ICD-10-CM

## 2015-02-18 DIAGNOSIS — R6889 Other general symptoms and signs: Secondary | ICD-10-CM

## 2015-02-18 DIAGNOSIS — M546 Pain in thoracic spine: Secondary | ICD-10-CM

## 2015-02-18 DIAGNOSIS — M542 Cervicalgia: Secondary | ICD-10-CM
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
## 2015-02-20 ENCOUNTER — Ambulatory Visit (HOSPITAL_COMMUNITY): Payer: No Typology Code available for payment source

## 2015-02-27 ENCOUNTER — Encounter (HOSPITAL_COMMUNITY): Payer: Medicare HMO | Admitting: Physical Therapy

## 2015-03-04 ENCOUNTER — Encounter (HOSPITAL_COMMUNITY): Payer: PPO

## 2015-03-20 ENCOUNTER — Encounter (HOSPITAL_COMMUNITY): Payer: Medicare Other | Admitting: Physical Therapy

## 2015-04-04 ENCOUNTER — Ambulatory Visit (HOSPITAL_COMMUNITY): Payer: No Typology Code available for payment source | Attending: Family Medicine | Admitting: Physical Therapy

## 2015-04-04 DIAGNOSIS — R52 Pain, unspecified: Secondary | ICD-10-CM

## 2015-04-04 DIAGNOSIS — M545 Low back pain: Secondary | ICD-10-CM | POA: Diagnosis not present

## 2015-04-04 DIAGNOSIS — M546 Pain in thoracic spine: Secondary | ICD-10-CM | POA: Insufficient documentation

## 2015-04-04 DIAGNOSIS — M2560 Stiffness of unspecified joint, not elsewhere classified: Secondary | ICD-10-CM

## 2015-04-04 DIAGNOSIS — R6889 Other general symptoms and signs: Secondary | ICD-10-CM

## 2015-04-04 DIAGNOSIS — M542 Cervicalgia: Secondary | ICD-10-CM | POA: Insufficient documentation
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
## 2016-03-24 DIAGNOSIS — M797 Fibromyalgia: Secondary | ICD-10-CM | POA: Diagnosis not present

## 2016-03-24 DIAGNOSIS — Z0000 Encounter for general adult medical examination without abnormal findings: Secondary | ICD-10-CM | POA: Diagnosis not present

## 2016-03-24 DIAGNOSIS — I10 Essential (primary) hypertension: Secondary | ICD-10-CM | POA: Diagnosis not present

## 2016-03-24 DIAGNOSIS — Z125 Encounter for screening for malignant neoplasm of prostate: Secondary | ICD-10-CM | POA: Diagnosis not present

## 2016-03-24 DIAGNOSIS — Z1211 Encounter for screening for malignant neoplasm of colon: Secondary | ICD-10-CM | POA: Diagnosis not present

## 2016-03-24 DIAGNOSIS — E781 Pure hyperglyceridemia: Secondary | ICD-10-CM | POA: Diagnosis not present

## 2016-04-02 DIAGNOSIS — Z1211 Encounter for screening for malignant neoplasm of colon: Secondary | ICD-10-CM | POA: Diagnosis not present

## 2016-04-08 DIAGNOSIS — W57XXXA Bitten or stung by nonvenomous insect and other nonvenomous arthropods, initial encounter: Secondary | ICD-10-CM | POA: Diagnosis not present

## 2016-04-08 DIAGNOSIS — A770 Spotted fever due to Rickettsia rickettsii: Secondary | ICD-10-CM | POA: Diagnosis not present

## 2016-05-11 DIAGNOSIS — S43421A Sprain of right rotator cuff capsule, initial encounter: Secondary | ICD-10-CM | POA: Diagnosis not present

## 2016-05-11 DIAGNOSIS — M170 Bilateral primary osteoarthritis of knee: Secondary | ICD-10-CM | POA: Diagnosis not present

## 2016-05-20 DIAGNOSIS — M1712 Unilateral primary osteoarthritis, left knee: Secondary | ICD-10-CM | POA: Diagnosis not present

## 2016-05-20 DIAGNOSIS — M519 Unspecified thoracic, thoracolumbar and lumbosacral intervertebral disc disorder: Secondary | ICD-10-CM | POA: Diagnosis not present

## 2016-05-20 DIAGNOSIS — M1711 Unilateral primary osteoarthritis, right knee: Secondary | ICD-10-CM | POA: Diagnosis not present

## 2016-05-20 DIAGNOSIS — M545 Low back pain: Secondary | ICD-10-CM | POA: Diagnosis not present

## 2016-06-16 DIAGNOSIS — M5137 Other intervertebral disc degeneration, lumbosacral region: Secondary | ICD-10-CM | POA: Diagnosis not present

## 2016-06-16 DIAGNOSIS — M7552 Bursitis of left shoulder: Secondary | ICD-10-CM | POA: Diagnosis not present

## 2016-11-08 DIAGNOSIS — I10 Essential (primary) hypertension: Secondary | ICD-10-CM | POA: Diagnosis not present

## 2016-11-08 DIAGNOSIS — E538 Deficiency of other specified B group vitamins: Secondary | ICD-10-CM | POA: Diagnosis not present

## 2016-11-08 DIAGNOSIS — W57XXXA Bitten or stung by nonvenomous insect and other nonvenomous arthropods, initial encounter: Secondary | ICD-10-CM | POA: Diagnosis not present

## 2016-11-08 DIAGNOSIS — E781 Pure hyperglyceridemia: Secondary | ICD-10-CM | POA: Diagnosis not present

## 2016-11-08 DIAGNOSIS — F330 Major depressive disorder, recurrent, mild: Secondary | ICD-10-CM | POA: Diagnosis not present

## 2016-11-08 DIAGNOSIS — E559 Vitamin D deficiency, unspecified: Secondary | ICD-10-CM | POA: Diagnosis not present

## 2016-11-08 DIAGNOSIS — Z23 Encounter for immunization: Secondary | ICD-10-CM | POA: Diagnosis not present

## 2016-11-08 DIAGNOSIS — E291 Testicular hypofunction: Secondary | ICD-10-CM | POA: Diagnosis not present

## 2016-11-12 DIAGNOSIS — M25522 Pain in left elbow: Secondary | ICD-10-CM | POA: Diagnosis not present

## 2017-04-20 DIAGNOSIS — E781 Pure hyperglyceridemia: Secondary | ICD-10-CM | POA: Diagnosis not present

## 2017-04-20 DIAGNOSIS — G894 Chronic pain syndrome: Secondary | ICD-10-CM | POA: Diagnosis not present

## 2017-04-20 DIAGNOSIS — Z125 Encounter for screening for malignant neoplasm of prostate: Secondary | ICD-10-CM | POA: Diagnosis not present

## 2017-04-20 DIAGNOSIS — Z1211 Encounter for screening for malignant neoplasm of colon: Secondary | ICD-10-CM | POA: Diagnosis not present

## 2017-04-20 DIAGNOSIS — E559 Vitamin D deficiency, unspecified: Secondary | ICD-10-CM | POA: Diagnosis not present

## 2017-04-20 DIAGNOSIS — Z0000 Encounter for general adult medical examination without abnormal findings: Secondary | ICD-10-CM | POA: Diagnosis not present

## 2017-04-20 DIAGNOSIS — I10 Essential (primary) hypertension: Secondary | ICD-10-CM | POA: Diagnosis not present

## 2017-04-21 DIAGNOSIS — M545 Low back pain: Secondary | ICD-10-CM | POA: Diagnosis not present

## 2017-04-22 DIAGNOSIS — M1711 Unilateral primary osteoarthritis, right knee: Secondary | ICD-10-CM | POA: Diagnosis not present

## 2017-04-25 DIAGNOSIS — G5602 Carpal tunnel syndrome, left upper limb: Secondary | ICD-10-CM | POA: Diagnosis not present

## 2017-04-26 DIAGNOSIS — Z1211 Encounter for screening for malignant neoplasm of colon: Secondary | ICD-10-CM | POA: Diagnosis not present

## 2017-05-05 DIAGNOSIS — M19071 Primary osteoarthritis, right ankle and foot: Secondary | ICD-10-CM | POA: Diagnosis not present

## 2017-05-05 DIAGNOSIS — M19072 Primary osteoarthritis, left ankle and foot: Secondary | ICD-10-CM | POA: Diagnosis not present

## 2017-05-25 DIAGNOSIS — S90561D Insect bite (nonvenomous), right ankle, subsequent encounter: Secondary | ICD-10-CM | POA: Diagnosis not present

## 2017-05-25 DIAGNOSIS — S70362A Insect bite (nonvenomous), left thigh, initial encounter: Secondary | ICD-10-CM | POA: Diagnosis not present

## 2017-05-25 DIAGNOSIS — S90562D Insect bite (nonvenomous), left ankle, subsequent encounter: Secondary | ICD-10-CM | POA: Diagnosis not present

## 2017-05-30 ENCOUNTER — Emergency Department (HOSPITAL_COMMUNITY)
Admission: EM | Admit: 2017-05-30 | Discharge: 2017-05-30 | Disposition: A | Discharge status: Home / Self Care | Payer: PPO | Attending: Emergency Medicine | Admitting: Emergency Medicine

## 2017-05-30 ENCOUNTER — Emergency Department (HOSPITAL_COMMUNITY): Payer: PPO

## 2017-05-30 ENCOUNTER — Telehealth (HOSPITAL_COMMUNITY): Payer: Self-pay | Admitting: Physical Therapy

## 2017-05-30 ENCOUNTER — Encounter (HOSPITAL_COMMUNITY): Payer: Self-pay

## 2017-05-30 DIAGNOSIS — L03115 Cellulitis of right lower limb: Secondary | ICD-10-CM | POA: Diagnosis not present

## 2017-05-30 DIAGNOSIS — Z79899 Other long term (current) drug therapy: Secondary | ICD-10-CM | POA: Insufficient documentation

## 2017-05-30 DIAGNOSIS — R600 Localized edema: Secondary | ICD-10-CM | POA: Diagnosis not present

## 2017-05-30 DIAGNOSIS — L03119 Cellulitis of unspecified part of limb: Secondary | ICD-10-CM

## 2017-05-30 DIAGNOSIS — L03116 Cellulitis of left lower limb: Secondary | ICD-10-CM | POA: Diagnosis not present

## 2017-05-30 HISTORY — DX: Chronic fatigue, unspecified: R53.82

## 2017-05-30 HISTORY — DX: Fibromyalgia: M79.7

## 2017-05-30 HISTORY — DX: Other chronic pain: G89.29

## 2017-05-30 LAB — CBC WITH DIFFERENTIAL/PLATELET
Basophils Absolute: 0 10*3/uL (ref 0.0–0.1)
Basophils Relative: 0 %
Eosinophils Absolute: 0.4 10*3/uL (ref 0.0–0.7)
Eosinophils Relative: 4 %
HCT: 36.8 % — ABNORMAL LOW (ref 39.0–52.0)
Hemoglobin: 12.7 g/dL — ABNORMAL LOW (ref 13.0–17.0)
Lymphocytes Relative: 31 %
Lymphs Abs: 3.2 10*3/uL (ref 0.7–4.0)
MCH: 30 pg (ref 26.0–34.0)
MCHC: 34.5 g/dL (ref 30.0–36.0)
MCV: 86.8 fL (ref 78.0–100.0)
Monocytes Absolute: 0.7 10*3/uL (ref 0.1–1.0)
Monocytes Relative: 7 %
Neutro Abs: 6 10*3/uL (ref 1.7–7.7)
Neutrophils Relative %: 58 %
Platelets: 275 10*3/uL (ref 150–400)
RBC: 4.24 MIL/uL (ref 4.22–5.81)
RDW: 12.9 % (ref 11.5–15.5)
WBC: 10.2 10*3/uL (ref 4.0–10.5)

## 2017-05-30 LAB — BASIC METABOLIC PANEL
Anion gap: 9 (ref 5–15)
BUN: 6 mg/dL (ref 6–20)
CO2: 24 mmol/L (ref 22–32)
Calcium: 9.2 mg/dL (ref 8.9–10.3)
Chloride: 107 mmol/L (ref 101–111)
Creatinine, Ser: 1.09 mg/dL (ref 0.61–1.24)
GFR calc Af Amer: >60 mL/min (ref >60)
GFR calc non Af Amer: >60 mL/min (ref >60)
Glucose, Bld: 84 mg/dL (ref 65–99)
Potassium: 3.8 mmol/L (ref 3.5–5.1)
Sodium: 140 mmol/L (ref 135–145)

## 2017-05-30 MED ORDER — VANCOMYCIN HCL IN DEXTROSE 1-5 GM/200ML-% IV SOLN
1000.0000 mg | Freq: Once | INTRAVENOUS | Status: AC
  Administered 2017-05-30: 1000 mg via INTRAVENOUS
  Filled 2017-05-30: qty 200

## 2017-05-30 MED ORDER — CLINDAMYCIN HCL 300 MG PO CAPS: 300.0000 mg | ORAL_CAPSULE | Freq: Four times a day (QID) | ORAL | 0 refills | Status: DC
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
## 2017-05-30 NOTE — Discharge Instructions (Signed)
Return tomorrow for recheck

## 2017-05-30 NOTE — ED Provider Notes (Signed)
[facility] DEPT Provider Note   CSN: [id number] Arrival date & time: 05/30/17  6213     History   Chief Complaint Chief Complaint  Patient presents with  . Cellulitis    HPI [name] [name] is a 51 y.o. male.  Patient complains of swelling and redness to both lower legs. He has been on doxycycline for possible infection.    Rash   This is a new problem. The current episode started more than 2 days ago. The problem has not changed since onset.The problem is associated with nothing. There has been no fever. Affected Location: Both lower legs. The pain is at a severity of 3/10. The pain is mild. The pain has been constant since onset. Pertinent negatives include no blisters. Treatments tried: Doxycycline. The treatment provided no relief.    Past Medical History:  Diagnosis Date  . Anxiety   . Chronic fatigue   . Chronic pain   . Fibromyalgia     Patient Active Problem List   Diagnosis Date Noted  . GASTROENTERITIS, ACUTE 01/22/2008  . FEVER UNSPECIFIED 11/10/2007  . DIZZINESS 11/06/2007  . HYPERLIPIDEMIA 04/26/2007  . DISORDER, BIPOLAR NOS 04/26/2007  . DEPRESSION 04/26/2007  . ALLERGIC RHINITIS 04/26/2007    Past Surgical History:  Procedure Laterality Date  . TONSILLECTOMY         Home Medications    Prior to Admission medications   Medication Sig Start Date End Date Taking? Authorizing Provider  clonazePAM (KLONOPIN) 2 MG tablet Take 2 mg by mouth 3 (three) times daily as needed for anxiety.   Yes [provider]  cyclobenzaprine (FLEXERIL) 10 MG tablet Take 1 tablet (10 mg total) by mouth 3 (three) times daily as needed for muscle spasms. 11/26/14  Yes Bethann Berkshire, MD  doxycycline (VIBRAMYCIN) 100 MG capsule Take 100 mg by mouth 2 (two) times daily.   Yes [provider]  DULoxetine (CYMBALTA) 60 MG capsule Take 60 mg by mouth 2 (two) times daily.    Yes [provider]  gabapentin (NEURONTIN) 400 MG capsule Take 800 mg by  mouth 2 (two) times daily as needed.  10/30/14  Yes [provider]  HYDROcodone-acetaminophen (NORCO) 7.5-325 MG tablet Take 1 tablet by mouth 2 (two) times daily as needed for moderate pain.   Yes [provider]  ibuprofen (ADVIL,MOTRIN) 100 MG chewable tablet Chew 100 mg by mouth 2 (two) times daily.   Yes [provider]  L-Arginine 500 MG CAPS Take 1 capsule by mouth daily.   Yes [provider]  meloxicam (MOBIC) 7.5 MG tablet Take 7.5 mg by mouth 2 (two) times daily. 10/30/14  Yes [provider]  nicotine (NICODERM CQ - DOSED IN MG/24 HOURS) 21 mg/24hr patch Place 21 mg onto the skin daily.   Yes [provider]  pantoprazole (PROTONIX) 40 MG tablet Take 40 mg by mouth daily. 10/30/14  Yes [provider]  zolpidem (AMBIEN) 10 MG tablet Take 10 mg by mouth at bedtime. 10/30/14  Yes [provider]    Family History No family history on file.  Social History Social History  Substance Use Topics  . Smoking status: Never Smoker  . Smokeless tobacco: Not on file  . Alcohol use Yes     Comment: occasional     Allergies   Demerol [meperidine]; Levaquin [levofloxacin in d5w]; Morphine and related; Other; and Penicillins   Review of Systems Review of Systems  Constitutional: Negative for appetite change and fatigue.  HENT: Negative for congestion, ear discharge and sinus pressure.   Eyes: Negative for discharge.  Respiratory: Negative for cough.   Cardiovascular: Negative for chest pain.  Gastrointestinal: Negative for abdominal pain and diarrhea.  Genitourinary: Negative for frequency and hematuria.  Musculoskeletal: Negative for back pain.  Skin: Positive for rash.  Neurological: Negative for seizures and headaches.  Psychiatric/Behavioral: Negative for hallucinations.     Physical Exam Updated Vital Signs BP (!) 144/96 (BP Location: Right Arm)   Pulse 73   Temp 97.9 F (36.6 C) (Oral)   Resp 18    Ht 5\' 11"  (1.803 m)   Wt 99.8 kg (220 lb)   SpO2 97%   BMI 30.68 kg/m   Physical Exam  Constitutional: He is oriented to person, place, and time. He appears well-developed.  HENT:  Head: Normocephalic.  Eyes: Conjunctivae are normal.  Neck: No tracheal deviation present.  Cardiovascular:  No murmur heard. Musculoskeletal: Normal range of motion.  Neurological: He is oriented to person, place, and time.  Skin: There is erythema.  Patient has redness swelling and tenderness to both lower legs about halfway up with  Psychiatric: He has a normal mood and affect.     ED Treatments / Results  Labs (all labs ordered are listed, but only abnormal results are displayed) Labs Reviewed  CBC WITH DIFFERENTIAL/PLATELET - Abnormal; Notable for the following:       Result Value   Hemoglobin 12.7 (*)    HCT 36.8 (*)    All other components within normal limits  BASIC METABOLIC PANEL    EKG  EKG Interpretation None       Radiology [place] Venous Img Lower Bilateral  Result Date: 05/30/2017 CLINICAL DATA:  Bilateral lower extremity edema EXAM: BILATERAL LOWER EXTREMITY VENOUS DUPLEX ULTRASOUND TECHNIQUE: Gray-scale sonography with graded compression, as well as color Doppler and duplex ultrasound were performed to evaluate the lower extremity deep venous systems from the level of the common femoral vein and including the common femoral, femoral, profunda femoral, popliteal and calf veins including the posterior tibial, peroneal and gastrocnemius veins when visible. The superficial great saphenous vein was also interrogated. Spectral Doppler was utilized to evaluate flow at rest and with distal augmentation maneuvers in the common femoral, femoral and popliteal veins. COMPARISON:  None. FINDINGS: RIGHT LOWER EXTREMITY Common Femoral Vein: No evidence of thrombus. Normal compressibility, respiratory phasicity and response to augmentation. Saphenofemoral Junction: No evidence of thrombus. Normal  compressibility and flow on color Doppler imaging. Profunda Femoral Vein: No evidence of thrombus. Normal compressibility and flow on color Doppler imaging. Femoral Vein: No evidence of thrombus. Normal compressibility, respiratory phasicity and response to augmentation. Popliteal Vein: No evidence of thrombus. Normal compressibility, respiratory phasicity and response to augmentation. Calf Veins: No evidence of thrombus. Normal compressibility and flow on color Doppler imaging. Superficial Great Saphenous Vein: No evidence of thrombus. Normal compressibility and flow on color Doppler imaging. Venous Reflux:  None. Other Findings:  There is lower extremity edema. LEFT LOWER EXTREMITY Common Femoral Vein: No evidence of thrombus. Normal compressibility, respiratory phasicity and response to augmentation. Saphenofemoral Junction: No evidence of thrombus. Normal compressibility and flow on color Doppler imaging. Profunda Femoral Vein: No evidence of thrombus. Normal compressibility and flow on color Doppler imaging. Femoral Vein: No evidence of thrombus. Normal compressibility, respiratory phasicity and response to augmentation. Popliteal Vein: No evidence of thrombus. Normal compressibility, respiratory phasicity and response to augmentation. Calf Veins: No evidence of thrombus. Normal compressibility and flow on color  Doppler imaging. Superficial Great Saphenous Vein: No evidence of thrombus. Normal compressibility and flow on color Doppler imaging. Venous Reflux:  None. Other Findings: There is lower extremity edema. There is a borderline prominent lymph node in the left inguinal region with central fatty hilum, benign in appearance. IMPRESSION: No evidence of deep venous thrombosis within either lower extremity. Borderline prominent lymph node left inguinal region, likely of reactive etiology. Soft tissue edema noted bilaterally. Electronically Signed   By: Bretta Bang III M.D.   On: 05/30/2017 14:01     Procedures Procedures (including critical care time)  Medications Ordered in ED Medications  vancomycin (VANCOCIN) IVPB 1000 mg/200 mL premix (1,000 mg Intravenous New Bag/Given 05/30/17 1319)     Initial Impression / Assessment and Plan / ED Course  I have reviewed the triage vital signs and the nursing notes.  Pertinent labs & imaging results that were available during my care of the patient were reviewed by me and considered in my medical decision making (see chart for details).     Patient with possible cellulitis to both lower legs. He is going to continue the doxycycline and I have added clindamycin. He will return tomorrow for recheck  Final Clinical Impressions(s) / ED Diagnoses   Final diagnoses:  None    New Prescriptions New Prescriptions   No medications on file     Bethann Berkshire, MD 05/30/17 [phone]

## 2017-05-30 NOTE — ED Triage Notes (Signed)
Capillary refill wnl.

## 2017-05-31 ENCOUNTER — Ambulatory Visit (HOSPITAL_COMMUNITY): Payer: Medicare PPO | Admitting: Physical Therapy

## 2017-05-31 ENCOUNTER — Encounter (HOSPITAL_COMMUNITY): Payer: Self-pay

## 2017-05-31 ENCOUNTER — Emergency Department (HOSPITAL_COMMUNITY)
Admission: EM | Admit: 2017-05-31 | Discharge: 2017-05-31 | Disposition: A | Discharge status: Home / Self Care | Payer: PPO | Attending: Emergency Medicine | Admitting: Emergency Medicine

## 2017-05-31 DIAGNOSIS — L03116 Cellulitis of left lower limb: Secondary | ICD-10-CM | POA: Diagnosis not present

## 2017-05-31 DIAGNOSIS — Z79899 Other long term (current) drug therapy: Secondary | ICD-10-CM | POA: Insufficient documentation

## 2017-05-31 DIAGNOSIS — L03115 Cellulitis of right lower limb: Secondary | ICD-10-CM | POA: Insufficient documentation

## 2017-05-31 DIAGNOSIS — L03119 Cellulitis of unspecified part of limb: Secondary | ICD-10-CM

## 2017-05-31 NOTE — ED Provider Notes (Signed)
[facility] Provider Note   CSN: [id number] Arrival date & time: 05/31/17  7829     History   Chief Complaint Chief Complaint  Patient presents with  . Wound Check    HPI [name] [name] is a 51 y.o. male.  Patient here for recheck of cellulitis to both lower legs.   The history is provided by the patient. No language interpreter was used.  Wound Check  This is a recurrent problem. The current episode started yesterday. The problem occurs constantly. The problem has been gradually improving. Pertinent negatives include no chest pain, no abdominal pain and no headaches. Nothing aggravates the symptoms. Nothing relieves the symptoms.    Past Medical History:  Diagnosis Date  . Anxiety   . Chronic fatigue   . Chronic pain   . Fibromyalgia     Patient Active Problem List   Diagnosis Date Noted  . GASTROENTERITIS, ACUTE 01/22/2008  . FEVER UNSPECIFIED 11/10/2007  . DIZZINESS 11/06/2007  . HYPERLIPIDEMIA 04/26/2007  . DISORDER, BIPOLAR NOS 04/26/2007  . DEPRESSION 04/26/2007  . ALLERGIC RHINITIS 04/26/2007    Past Surgical History:  Procedure Laterality Date  . TONSILLECTOMY         Home Medications    Prior to Admission medications   Medication Sig Start Date End Date Taking? Authorizing Provider  clindamycin (CLEOCIN) 300 MG capsule Take 1 capsule (300 mg total) by mouth 4 (four) times daily. X 7 days 05/30/17   Bethann Berkshire, MD  clonazePAM (KLONOPIN) 2 MG tablet Take 2 mg by mouth 3 (three) times daily as needed for anxiety.    [provider]  cyclobenzaprine (FLEXERIL) 10 MG tablet Take 1 tablet (10 mg total) by mouth 3 (three) times daily as needed for muscle spasms. 11/26/14   Bethann Berkshire, MD  doxycycline (VIBRAMYCIN) 100 MG capsule Take 100 mg by mouth 2 (two) times daily.    [provider]  DULoxetine (CYMBALTA) 60 MG capsule Take 60 mg by mouth 2 (two) times daily.     [provider]  gabapentin (NEURONTIN) 400 MG  capsule Take 800 mg by mouth 2 (two) times daily as needed.  10/30/14   [provider]  HYDROcodone-acetaminophen (NORCO) 7.5-325 MG tablet Take 1 tablet by mouth 2 (two) times daily as needed for moderate pain.    [provider]  ibuprofen (ADVIL,MOTRIN) 100 MG chewable tablet Chew 100 mg by mouth 2 (two) times daily.    [provider]  L-Arginine 500 MG CAPS Take 1 capsule by mouth daily.    [provider]  meloxicam (MOBIC) 7.5 MG tablet Take 7.5 mg by mouth 2 (two) times daily. 10/30/14   [provider]  nicotine (NICODERM CQ - DOSED IN MG/24 HOURS) 21 mg/24hr patch Place 21 mg onto the skin daily.    [provider]  pantoprazole (PROTONIX) 40 MG tablet Take 40 mg by mouth daily. 10/30/14   [provider]  zolpidem (AMBIEN) 10 MG tablet Take 10 mg by mouth at bedtime. 10/30/14   [provider]    Family History No family history on file.  Social History Social History  Substance Use Topics  . Smoking status: Never Smoker  . Smokeless tobacco: Never Used  . Alcohol use Yes     Comment: occasional     Allergies   Demerol [meperidine]; Levaquin [levofloxacin in d5w]; Morphine and related; Other; and Penicillins   Review of Systems Review of Systems  Constitutional: Negative for appetite change  and fatigue.  HENT: Negative for congestion, ear discharge and sinus pressure.   Eyes: Negative for discharge.  Respiratory: Negative for cough.   Cardiovascular: Negative for chest pain.  Gastrointestinal: Negative for abdominal pain and diarrhea.  Genitourinary: Negative for frequency and hematuria.  Musculoskeletal: Negative for back pain.       Swelling redness of both lower legs  Skin: Negative for rash.  Neurological: Negative for seizures and headaches.  Psychiatric/Behavioral: Negative for hallucinations.     Physical Exam Updated Vital Signs BP (!) 142/91 (BP Location: Right Arm)   Pulse 90    Temp 97.7 F (36.5 C) (Oral)   Resp 16   Wt 99.8 kg (220 lb)   SpO2 100%   BMI 30.68 kg/m   Physical Exam  Constitutional: He is oriented to person, place, and time. He appears well-developed.  HENT:  Head: Normocephalic.  Eyes: Conjunctivae are normal.  Neck: No tracheal deviation present.  Cardiovascular:  No murmur heard. Musculoskeletal: Normal range of motion.  Patient has swelling and redness to both lower legs. The cellulitis seems to be improving mildly  Neurological: He is oriented to person, place, and time.  Skin: Skin is warm.  Psychiatric: He has a normal mood and affect.     ED Treatments / Results  Labs (all labs ordered are listed, but only abnormal results are displayed) Labs Reviewed - No data to display  EKG  EKG Interpretation None       Radiology US Venous Img Lower Bilateral  Result Date: 05/30/2017 CLINICAL DATA:  Bilateral lower extremity edema EXAM: BILATERAL LOWER EXTREMITY VENOUS DUPLEX ULTRASOUND TECHNIQUE: Gray-scale sonography with graded compression, as well as color Doppler and duplex ultrasound were performed to evaluate the lower extremity deep venous systems from the level of the common femoral vein and including the common femoral, femoral, profunda femoral, popliteal and calf veins including the posterior tibial, peroneal and gastrocnemius veins when visible. The superficial great saphenous vein was also interrogated. Spectral Doppler was utilized to evaluate flow at rest and with distal augmentation maneuvers in the common femoral, femoral and popliteal veins. COMPARISON:  None. FINDINGS: RIGHT LOWER EXTREMITY Common Femoral Vein: No evidence of thrombus. Normal compressibility, respiratory phasicity and response to augmentation. Saphenofemoral Junction: No evidence of thrombus. Normal compressibility and flow on color Doppler imaging. Profunda Femoral Vein: No evidence of thrombus. Normal compressibility and flow on color Doppler imaging.  Femoral Vein: No evidence of thrombus. Normal compressibility, respiratory phasicity and response to augmentation. Popliteal Vein: No evidence of thrombus. Normal compressibility, respiratory phasicity and response to augmentation. Calf Veins: No evidence of thrombus. Normal compressibility and flow on color Doppler imaging. Superficial Great Saphenous Vein: No evidence of thrombus. Normal compressibility and flow on color Doppler imaging. Venous Reflux:  None. Other Findings:  There is lower extremity edema. LEFT LOWER EXTREMITY Common Femoral Vein: No evidence of thrombus. Normal compressibility, respiratory phasicity and response to augmentation. Saphenofemoral Junction: No evidence of thrombus. Normal compressibility and flow on color Doppler imaging. Profunda Femoral Vein: No evidence of thrombus. Normal compressibility and flow on color Doppler imaging. Femoral Vein: No evidence of thrombus. Normal compressibility, respiratory phasicity and response to augmentation. Popliteal Vein: No evidence of thrombus. Normal compressibility, respiratory phasicity and response to augmentation. Calf Veins: No evidence of thrombus. Normal compressibility and flow on color Doppler imaging. Superficial Great Saphenous Vein: No evidence of thrombus. Normal compressibility and flow on color Doppler imaging. Venous Reflux:  None. Other Findings: There is lower extremity edema.  There is a borderline prominent lymph node in the left inguinal region with central fatty hilum, benign in appearance. IMPRESSION: No evidence of deep venous thrombosis within either lower extremity. Borderline prominent lymph node left inguinal region, likely of reactive etiology. Soft tissue edema noted bilaterally. Electronically Signed   By: Bretta BangWilliam  Woodruff III M.D.   On: 05/30/2017 14:01    Procedures Procedures (including critical care time)  Medications Ordered in ED Medications - No data to display   Initial Impression / Assessment and  Plan / ED Course  I have reviewed the triage vital signs and the nursing notes.  Pertinent labs & imaging results that were available during my care of the patient were reviewed by me and considered in my medical decision making (see chart for details).     Patient with inflammation of both lower legs possible cellulitis he will continue taking the doxycycline and the clindamycin and will follow-up with his PCP This week beginning next week  Final Clinical Impressions(s) / ED Diagnoses   Final diagnoses:  Cellulitis of lower extremity, unspecified laterality    New Prescriptions New Prescriptions   No medications on file     Bethann BerkshireZammit, Janasia Coverdale, MD 05/31/17 [phone]

## 2017-05-31 NOTE — ED Notes (Signed)
Pt made aware to return if symptoms worsen or if any life threatening symptoms occur.   

## 2017-05-31 NOTE — Discharge Instructions (Signed)
Follow up with your md the end of this week or next week

## 2017-05-31 NOTE — ED Triage Notes (Signed)
Pt in for recheck for bilateral lower ext cellulitis. Redness has not extended above lines marked by provider yesterday. Also requesting stool softner.. Reports bleeding with BM

## 2017-06-01 ENCOUNTER — Telehealth (HOSPITAL_COMMUNITY): Payer: Self-pay | Admitting: Family Medicine

## 2017-06-01 NOTE — Telephone Encounter (Signed)
06/01/17 pt cx because he was bitten by a snake and his legs and ankles are really swollen.  He was told not to do any therapy at this time and he will call back to reschedule

## 2017-06-02 ENCOUNTER — Ambulatory Visit (HOSPITAL_COMMUNITY): Payer: Medicare PPO

## 2017-06-02 ENCOUNTER — Encounter (HOSPITAL_COMMUNITY): Payer: Self-pay

## 2017-06-10 DIAGNOSIS — L03119 Cellulitis of unspecified part of limb: Secondary | ICD-10-CM | POA: Diagnosis not present

## 2017-06-12 ENCOUNTER — Encounter (HOSPITAL_COMMUNITY): Payer: Self-pay | Admitting: Emergency Medicine

## 2017-06-12 ENCOUNTER — Inpatient Hospital Stay (HOSPITAL_COMMUNITY)
Admission: EM | Admit: 2017-06-12 | Discharge: 2017-06-13 | DRG: 603 | Disposition: A | Discharge status: Home / Self Care | Payer: PPO | Attending: Internal Medicine | Admitting: Internal Medicine

## 2017-06-12 DIAGNOSIS — Z8249 Family history of ischemic heart disease and other diseases of the circulatory system: Secondary | ICD-10-CM

## 2017-06-12 DIAGNOSIS — M797 Fibromyalgia: Secondary | ICD-10-CM | POA: Diagnosis not present

## 2017-06-12 DIAGNOSIS — L03116 Cellulitis of left lower limb: Secondary | ICD-10-CM | POA: Diagnosis not present

## 2017-06-12 DIAGNOSIS — Z9109 Other allergy status, other than to drugs and biological substances: Secondary | ICD-10-CM

## 2017-06-12 DIAGNOSIS — R5382 Chronic fatigue, unspecified: Secondary | ICD-10-CM | POA: Diagnosis present

## 2017-06-12 DIAGNOSIS — F3113 Bipolar disorder, current episode manic without psychotic features, severe: Secondary | ICD-10-CM

## 2017-06-12 DIAGNOSIS — W5503XA Scratched by cat, initial encounter: Secondary | ICD-10-CM | POA: Diagnosis not present

## 2017-06-12 DIAGNOSIS — F17210 Nicotine dependence, cigarettes, uncomplicated: Secondary | ICD-10-CM | POA: Diagnosis not present

## 2017-06-12 DIAGNOSIS — F319 Bipolar disorder, unspecified: Secondary | ICD-10-CM | POA: Diagnosis not present

## 2017-06-12 DIAGNOSIS — Z791 Long term (current) use of non-steroidal anti-inflammatories (NSAID): Secondary | ICD-10-CM

## 2017-06-12 DIAGNOSIS — L0390 Cellulitis, unspecified: Secondary | ICD-10-CM | POA: Diagnosis present

## 2017-06-12 DIAGNOSIS — G8929 Other chronic pain: Secondary | ICD-10-CM | POA: Diagnosis present

## 2017-06-12 DIAGNOSIS — F419 Anxiety disorder, unspecified: Secondary | ICD-10-CM | POA: Diagnosis present

## 2017-06-12 DIAGNOSIS — R591 Generalized enlarged lymph nodes: Secondary | ICD-10-CM | POA: Diagnosis present

## 2017-06-12 DIAGNOSIS — Z79899 Other long term (current) drug therapy: Secondary | ICD-10-CM

## 2017-06-12 DIAGNOSIS — E785 Hyperlipidemia, unspecified: Secondary | ICD-10-CM | POA: Diagnosis not present

## 2017-06-12 DIAGNOSIS — Z885 Allergy status to narcotic agent status: Secondary | ICD-10-CM | POA: Diagnosis not present

## 2017-06-12 DIAGNOSIS — M7989 Other specified soft tissue disorders: Secondary | ICD-10-CM | POA: Diagnosis not present

## 2017-06-12 DIAGNOSIS — L03115 Cellulitis of right lower limb: Secondary | ICD-10-CM

## 2017-06-12 DIAGNOSIS — Z880 Allergy status to penicillin: Secondary | ICD-10-CM

## 2017-06-12 DIAGNOSIS — L03119 Cellulitis of unspecified part of limb: Secondary | ICD-10-CM

## 2017-06-12 LAB — CBC WITH DIFFERENTIAL/PLATELET
Basophils Absolute: 0 10*3/uL (ref 0.0–0.1)
Basophils Relative: 0 %
Eosinophils Absolute: 0.4 10*3/uL (ref 0.0–0.7)
Eosinophils Relative: 4 %
HCT: 33.6 % — ABNORMAL LOW (ref 39.0–52.0)
Hemoglobin: 11.8 g/dL — ABNORMAL LOW (ref 13.0–17.0)
Lymphocytes Relative: 27 %
Lymphs Abs: 2.4 10*3/uL (ref 0.7–4.0)
MCH: 30.7 pg (ref 26.0–34.0)
MCHC: 35.1 g/dL (ref 30.0–36.0)
MCV: 87.5 fL (ref 78.0–100.0)
Monocytes Absolute: 0.6 10*3/uL (ref 0.1–1.0)
Monocytes Relative: 6 %
Neutro Abs: 5.5 10*3/uL (ref 1.7–7.7)
Neutrophils Relative %: 63 %
Platelets: 221 10*3/uL (ref 150–400)
RBC: 3.84 MIL/uL — ABNORMAL LOW (ref 4.22–5.81)
RDW: 13 % (ref 11.5–15.5)
WBC: 8.9 10*3/uL (ref 4.0–10.5)

## 2017-06-12 LAB — APTT: aPTT: 31 seconds (ref 24–36)

## 2017-06-12 LAB — COMPREHENSIVE METABOLIC PANEL
ALT: 38 U/L (ref 17–63)
AST: 30 U/L (ref 15–41)
Albumin: 3.5 g/dL (ref 3.5–5.0)
Alkaline Phosphatase: 61 U/L (ref 38–126)
Anion gap: 9 (ref 5–15)
BUN: 8 mg/dL (ref 6–20)
CO2: 25 mmol/L (ref 22–32)
Calcium: 8.8 mg/dL — ABNORMAL LOW (ref 8.9–10.3)
Chloride: 104 mmol/L (ref 101–111)
Creatinine, Ser: 0.91 mg/dL (ref 0.61–1.24)
GFR calc Af Amer: >60 mL/min (ref >60)
GFR calc non Af Amer: >60 mL/min (ref >60)
Glucose, Bld: 121 mg/dL — ABNORMAL HIGH (ref 65–99)
Potassium: 3.6 mmol/L (ref 3.5–5.1)
Sodium: 138 mmol/L (ref 135–145)
Total Bilirubin: 0.7 mg/dL (ref 0.3–1.2)
Total Protein: 6.4 g/dL — ABNORMAL LOW (ref 6.5–8.1)

## 2017-06-12 LAB — PROTIME-INR
INR: 1
Prothrombin Time: 13.2 seconds (ref 11.4–15.2)

## 2017-06-12 MED ORDER — METRONIDAZOLE IN NACL 5-0.79 MG/ML-% IV SOLN
500.0000 mg | Freq: Once | INTRAVENOUS | Status: AC
  Administered 2017-06-12: 500 mg via INTRAVENOUS
  Filled 2017-06-12: qty 100

## 2017-06-12 MED ORDER — PNEUMOCOCCAL VAC POLYVALENT 25 MCG/0.5ML IJ INJ: 0.5000 mL | INJECTION | INTRAMUSCULAR | Status: DC

## 2017-06-12 MED ORDER — PANTOPRAZOLE SODIUM 40 MG PO TBEC
40.0000 mg | DELAYED_RELEASE_TABLET | Freq: Every day | ORAL | Status: DC
  Administered 2017-06-12: 40 mg via ORAL
  Filled 2017-06-12 (×2): qty 1

## 2017-06-12 MED ORDER — GABAPENTIN 400 MG PO CAPS
800.0000 mg | ORAL_CAPSULE | Freq: Two times a day (BID) | ORAL | Status: DC
  Administered 2017-06-12: 800 mg via ORAL
  Filled 2017-06-12 (×2): qty 2

## 2017-06-12 MED ORDER — DOXYCYCLINE HYCLATE 100 MG PO TABS
100.0000 mg | ORAL_TABLET | Freq: Two times a day (BID) | ORAL | Status: DC
  Administered 2017-06-12: 100 mg via ORAL
  Filled 2017-06-12 (×2): qty 1

## 2017-06-12 MED ORDER — SENNOSIDES-DOCUSATE SODIUM 8.6-50 MG PO TABS: 1.0000 | ORAL_TABLET | Freq: Every evening | ORAL | Status: DC | PRN

## 2017-06-12 MED ORDER — SODIUM CHLORIDE 0.9 % IV SOLN
250.0000 mL | INTRAVENOUS | Status: DC | PRN
Start: 2017-06-12 — End: 2017-06-13

## 2017-06-12 MED ORDER — METRONIDAZOLE 500 MG PO TABS
500.0000 mg | ORAL_TABLET | Freq: Three times a day (TID) | ORAL | Status: DC
  Administered 2017-06-12 – 2017-06-13 (×3): 500 mg via ORAL
  Filled 2017-06-12 (×3): qty 1

## 2017-06-12 MED ORDER — VANCOMYCIN HCL IN DEXTROSE 1-5 GM/200ML-% IV SOLN
1000.0000 mg | Freq: Once | INTRAVENOUS | Status: AC
  Administered 2017-06-12: 1000 mg via INTRAVENOUS
  Filled 2017-06-12: qty 200

## 2017-06-12 MED ORDER — DEXTROSE 5 % IV SOLN
100.0000 mg | Freq: Once | INTRAVENOUS | Status: AC
  Administered 2017-06-12: 100 mg via INTRAVENOUS
  Filled 2017-06-12: qty 100

## 2017-06-12 MED ORDER — SODIUM CHLORIDE 0.9% FLUSH: 3.0000 mL | INTRAVENOUS | Status: DC | PRN

## 2017-06-12 MED ORDER — SODIUM CHLORIDE 0.9% FLUSH
3.0000 mL | Freq: Two times a day (BID) | INTRAVENOUS | Status: DC
  Administered 2017-06-12: 3 mL via INTRAVENOUS

## 2017-06-12 MED ORDER — IBUPROFEN 400 MG PO TABS
400.0000 mg | ORAL_TABLET | Freq: Four times a day (QID) | ORAL | Status: DC | PRN
  Administered 2017-06-12: 400 mg via ORAL
  Filled 2017-06-12: qty 1

## 2017-06-12 MED ORDER — FENOFIBRATE 54 MG PO TABS
54.0000 mg | ORAL_TABLET | Freq: Every day | ORAL | Status: DC
  Filled 2017-06-12 (×4): qty 1

## 2017-06-12 MED ORDER — ZOLPIDEM TARTRATE 5 MG PO TABS
10.0000 mg | ORAL_TABLET | Freq: Every day | ORAL | Status: DC
  Administered 2017-06-12: 10 mg via ORAL
  Filled 2017-06-12: qty 2

## 2017-06-12 MED ORDER — ENOXAPARIN SODIUM 40 MG/0.4ML ~~LOC~~ SOLN
40.0000 mg | SUBCUTANEOUS | Status: DC
  Administered 2017-06-12: 40 mg via SUBCUTANEOUS
  Filled 2017-06-12: qty 0.4

## 2017-06-12 MED ORDER — NICOTINE 21 MG/24HR TD PT24
21.0000 mg | MEDICATED_PATCH | Freq: Every day | TRANSDERMAL | Status: DC
  Filled 2017-06-12: qty 1

## 2017-06-12 MED ORDER — CLONAZEPAM 0.5 MG PO TABS
2.0000 mg | ORAL_TABLET | Freq: Three times a day (TID) | ORAL | Status: DC | PRN
  Administered 2017-06-12 – 2017-06-13 (×2): 2 mg via ORAL
  Filled 2017-06-12 (×2): qty 4

## 2017-06-12 MED ORDER — ONDANSETRON HCL 4 MG/2ML IJ SOLN: 4.0000 mg | Freq: Four times a day (QID) | INTRAMUSCULAR | Status: DC | PRN

## 2017-06-12 MED ORDER — ONDANSETRON HCL 4 MG PO TABS: 4.0000 mg | ORAL_TABLET | Freq: Four times a day (QID) | ORAL | Status: DC | PRN

## 2017-06-12 MED ORDER — TRAMADOL HCL 50 MG PO TABS
100.0000 mg | ORAL_TABLET | Freq: Four times a day (QID) | ORAL | Status: DC | PRN
  Administered 2017-06-12 (×2): 100 mg via ORAL
  Filled 2017-06-12 (×2): qty 2

## 2017-06-12 MED ORDER — DULOXETINE HCL 60 MG PO CPEP
60.0000 mg | ORAL_CAPSULE | Freq: Two times a day (BID) | ORAL | Status: DC
  Administered 2017-06-12: 60 mg via ORAL
  Filled 2017-06-12 (×2): qty 1

## 2017-06-12 NOTE — ED Provider Notes (Signed)
[facility] Provider Note   CSN: [id number] Arrival date & time: 06/12/17  1101     History   Chief Complaint Chief Complaint  Patient presents with  . Leg Pain  . Leg Swelling    HPI [name] [name] is a 51 y.o. male.  HPI patient was evaluated in the emergency department on 6/4 for bilateral lower extremity swelling and redness. At that time redness did not extend above the knees bilaterally. Was given IV vancomycin and started on clindamycin. Patient states that the swelling and redness improved. He finished the course of antibiotics. Was evaluated by his primary physician several days ago and stated that the infection had healed. Patient now presents with worsening swelling, redness and pain to bilateral lower extremities. He now extend up above the knees with tracking into the groin bilaterally. He has some right-sided inguinal discomfort. He complains of episodic subjective fevers and chills. No nausea or vomiting. Patient states that prior to onset of symptoms he was walking through a ditch. He thought he felt a sting on his left ankle. He did not visualize any snakes or insects. Per patient's mother patient has history of bipolar and may have been off his medications.  Past Medical History:  Diagnosis Date  . Anxiety   . Chronic fatigue   . Chronic pain   . Fibromyalgia     Patient Active Problem List   Diagnosis Date Noted  . Cellulitis 06/12/2017  . GASTROENTERITIS, ACUTE 01/22/2008  . FEVER UNSPECIFIED 11/10/2007  . DIZZINESS 11/06/2007  . HYPERLIPIDEMIA 04/26/2007  . DISORDER, BIPOLAR NOS 04/26/2007  . DEPRESSION 04/26/2007  . ALLERGIC RHINITIS 04/26/2007    Past Surgical History:  Procedure Laterality Date  . KNEE ARTHROSCOPY Left 2006   miniscus tear  . LYMPH GLAND EXCISION Left 1983  . TONSILLECTOMY         Home Medications    Prior to Admission medications   Medication Sig Start Date End Date Taking? Authorizing Provider  clonazePAM  (KLONOPIN) 2 MG tablet Take 2 mg by mouth 3 (three) times daily as needed for anxiety.   Yes [provider]  cyclobenzaprine (FLEXERIL) 10 MG tablet Take 1 tablet (10 mg total) by mouth 3 (three) times daily as needed for muscle spasms. 11/26/14  Yes Bethann BerkshireZammit, Joseph, MD  DULoxetine (CYMBALTA) 60 MG capsule Take 60 mg by mouth 2 (two) times daily.    Yes [provider]  fenofibrate (TRICOR) 145 MG tablet Take 145 mg by mouth daily.   Yes [provider]  gabapentin (NEURONTIN) 400 MG capsule Take 800 mg by mouth 3 (three) times daily.  10/30/14  Yes [provider]  HYDROcodone-acetaminophen (NORCO) 7.5-325 MG tablet Take 1 tablet by mouth 2 (two) times daily as needed for moderate pain.   Yes [provider]  ibuprofen (ADVIL,MOTRIN) 100 MG chewable tablet Chew 100 mg by mouth 2 (two) times daily.   Yes [provider]  L-Arginine 500 MG CAPS Take 1 capsule by mouth daily.   Yes [provider]  Multiple Vitamins-Minerals (PRESERVISION AREDS PO) Take 1 tablet by mouth 2 (two) times daily.   Yes [provider]  nicotine (NICODERM CQ - DOSED IN MG/24 HOURS) 21 mg/24hr patch Place 21 mg onto the skin daily.   Yes [provider]  pantoprazole (PROTONIX) 40 MG tablet Take 40 mg by mouth daily. 10/30/14  Yes [provider]  pravastatin (PRAVACHOL) 10 MG tablet Take 10 mg by mouth daily.  Yes [provider]  traMADol (ULTRAM) 50 MG tablet Take 100 mg by mouth every 6 (six) hours as needed.   Yes [provider]  zolpidem (AMBIEN) 10 MG tablet Take 10 mg by mouth at bedtime. 10/30/14  Yes [provider]  doxycycline (VIBRA-TABS) 100 MG tablet Take 1 tablet (100 mg total) by mouth every 12 (twelve) hours. 06/13/17 06/27/17  Philip Aspen, Limmie Patricia, MD  metroNIDAZOLE (FLAGYL) 500 MG tablet Take 1 tablet (500 mg total) by mouth every 8 (eight) hours. 06/13/17 06/27/17  Henderson Cloud,  MD    Family History Family History  Problem Relation Age of Onset  . Diabetes Mother   . Heart attack Father   . Diabetes Maternal Grandmother     Social History Social History  Substance Use Topics  . Smoking status: Current Every Day Smoker    Packs/day: 0.25  . Smokeless tobacco: Never Used  . Alcohol use Yes     Comment: occasional     Allergies   Demerol [meperidine]; Levaquin [levofloxacin in d5w]; Morphine and related; Other; and Penicillins   Review of Systems Review of Systems  Constitutional: Positive for chills, fatigue and fever.  Respiratory: Negative for cough and shortness of breath.   Cardiovascular: Negative for chest pain.  Gastrointestinal: Negative for abdominal pain, diarrhea, nausea and vomiting.  Genitourinary: Negative for dysuria, flank pain and frequency.  Musculoskeletal: Positive for myalgias. Negative for back pain, neck pain and neck stiffness.  Skin: Positive for color change, rash and wound.  Neurological: Negative for dizziness, weakness, light-headedness, numbness and headaches.  Psychiatric/Behavioral: Positive for sleep disturbance.  All other systems reviewed and are negative.    Physical Exam Updated Vital Signs BP 124/84 (BP Location: Right Arm)   Pulse 81   Temp 98.2 F (36.8 C) (Oral)   Resp 18   Ht 5\' 10"  (1.778 m)   Wt 106.3 kg (234 lb 4.8 oz)   SpO2 98%   BMI 33.62 kg/m   Physical Exam  Constitutional: He is oriented to person, place, and time. He appears well-developed and well-nourished. No distress.  HENT:  Head: Normocephalic and atraumatic.  Mouth/Throat: Oropharynx is clear and moist. No oropharyngeal exudate.  Eyes: EOM are normal. Pupils are equal, round, and reactive to light.  Neck: Normal range of motion. Neck supple.  Cardiovascular: Normal rate and regular rhythm.  Exam reveals no gallop and no friction rub.   No murmur heard. Pulmonary/Chest: Effort normal and breath sounds normal. No respiratory  distress. He has no wheezes. He has no rales. He exhibits no tenderness.  Abdominal: Soft. Bowel sounds are normal. There is no tenderness. There is no rebound and no guarding.  Musculoskeletal: Normal range of motion. He exhibits edema and tenderness.  Patient with warmth, erythema and 3+ edema to bilateral lower extremities from the feet extending up to the mid thigh. Patient has erythematous streaking up the medial thigh and inguinal lymphadenopathy in the right groin. Distal pulses are intact. Multiple healing superficial scratches and abrasions to bilateral lower extremities. Questionable small puncture wound to the left lateral ankle without evidence of induration, fluctuance.  Neurological: He is alert and oriented to person, place, and time.  Moves all extremities without focal deficit. Sensation intact.  Skin: Skin is warm and dry. Capillary refill takes 2 to 3 seconds. No rash noted. There is erythema.  Psychiatric:  Pressured speech. Flight of ideas. Questionable delusions regarding snakebites  Nursing note and vitals reviewed.  ED Treatments / Results  Labs (all labs ordered are listed, but only abnormal results are displayed) Labs Reviewed  CBC WITH DIFFERENTIAL/PLATELET - Abnormal; Notable for the following:       Result Value   RBC 3.84 (*)    Hemoglobin 11.8 (*)    HCT 33.6 (*)    All other components within normal limits  COMPREHENSIVE METABOLIC PANEL - Abnormal; Notable for the following:    Glucose, Bld 121 (*)    Calcium 8.8 (*)    Total Protein 6.4 (*)    All other components within normal limits  BASIC METABOLIC PANEL - Abnormal; Notable for the following:    Glucose, Bld 103 (*)    Calcium 8.6 (*)    All other components within normal limits  CBC - Abnormal; Notable for the following:    RBC 4.03 (*)    Hemoglobin 12.3 (*)    HCT 35.7 (*)    All other components within normal limits  CULTURE, BLOOD (ROUTINE X 2)  CULTURE, BLOOD (ROUTINE X 2)    PROTIME-INR  APTT  HIV ANTIBODY (ROUTINE TESTING)    EKG  EKG Interpretation None       Radiology No results found.  Procedures Procedures (including critical care time)  Medications Ordered in ED Medications  vancomycin (VANCOCIN) IVPB 1000 mg/200 mL premix (0 mg Intravenous Stopped 06/12/17 1349)  doxycycline (VIBRAMYCIN) 100 mg in dextrose 5 % 250 mL IVPB (0 mg Intravenous Stopped 06/12/17 1554)  metroNIDAZOLE (FLAGYL) IVPB 500 mg (0 mg Intravenous Stopped 06/12/17 1454)     Initial Impression / Assessment and Plan / ED Course  I have reviewed the triage vital signs and the nursing notes.  Pertinent labs & imaging results that were available during my care of the patient were reviewed by me and considered in my medical decision making (see chart for details).    Patient is with bilateral lower extremity cellulitis which is worsening over previous presentations. Failed outpatient treatment. We'll start IV vancomycin and will need inpatient treatment. Patient also has history of bipolar and presents with some mild symptoms of mania. Per mother, she's not sure whether he is been on his medication. Mother: Sedale Jenifer [phone] [facility]) Sister: Alease Medina [phone] [facility]), [phone] (W)  Discussed with hospitalist and will admit. Final Clinical Impressions(s) / ED Diagnoses   Final diagnoses:  Cellulitis of both lower extremities    New Prescriptions Discharge Medication List as of 06/13/2017  1:09 PM    START taking these medications   Details  doxycycline (VIBRA-TABS) 100 MG tablet Take 1 tablet (100 mg total) by mouth every 12 (twelve) hours., Starting Mon 06/13/2017, Until Mon 06/27/2017, Print    metroNIDAZOLE (FLAGYL) 500 MG tablet Take 1 tablet (500 mg total) by mouth every 8 (eight) hours., Starting Mon 06/13/2017, Until Mon 06/27/2017, Print         Loren Racer, MD 06/14/17 1758

## 2017-06-12 NOTE — ED Triage Notes (Signed)
Bit by snake on last  Tuesday- pt complains of bilateral leg swelling with increased pain  Seen by Dr Hyman HopesWebb at [facility]

## 2017-06-12 NOTE — ED Notes (Addendum)
Pt stable and ready for transport to [facility]. Report given Phylliss BobAngie Akers, RN.

## 2017-06-12 NOTE — H&P (Signed)
History and Physical    [name] [name] ZOX:[id number] DOB: [birth year] DOA: 06/12/2017  Referring MD/NP/PA: Loren Racer  PCP: Shirlean Mylar, MD  Patient coming from: Home  Chief Complaint: leg swelling and redness  HPI: [name] [name] is a 51 y.o. male with h/o bipolar disorder who is a very poor historian. He states that last Tuesday (5 days ago) he was working in a ditch and felt a sharp sting around both of his ankles that he thought could have been "either a mosquito or a snake bite". Didn't think to look down and observe his surroundings. This event actually occurred before 6/4, according to our records, as on this day he sought medical attention in the ED for this and was sent home on clindamycin. He comes back today because now the redness and swelling is extending up into the thigh/groin area and he actually has lymphadenopathy of the groin. He appears to have several scratch marks on his legs and small puncture wounds around his ankles. His black tshirt is also covered in hair. When asked about the potential of cat bite/scratch, he states he has 8 cats at home and it is possible they may have scratched him. Admission requested for failure of OP abx. He is afebrile, WBCs are 8.9, he had a LE venous doppler on 6/4 that was negative for DVT.  Past Medical/Surgical History: Past Medical History:  Diagnosis Date  . Anxiety   . Chronic fatigue   . Chronic pain   . Fibromyalgia     Past Surgical History:  Procedure Laterality Date  . KNEE ARTHROSCOPY Left 2006   miniscus tear  . LYMPH GLAND EXCISION Left 1983  . TONSILLECTOMY      Social History:  reports that he has been smoking.  He has been smoking about 0.25 packs per day. He has never used smokeless tobacco. He reports that he drinks alcohol. He reports that he does not use drugs.  Allergies: Allergies  Allergen Reactions  . Demerol [Meperidine]   . Levaquin [Levofloxacin In D5w]   . Morphine And Related   . Other    arythromycin  . Penicillins     REACTION: Rash and facial swelling at age 20    Family History:  Family History  Problem Relation Age of Onset  . Diabetes Mother   . Heart attack Father   . Diabetes Maternal Grandmother     Prior to Admission medications   Medication Sig Start Date End Date Taking? Authorizing Provider  clonazePAM (KLONOPIN) 2 MG tablet Take 2 mg by mouth 3 (three) times daily as needed for anxiety.   Yes [provider]  cyclobenzaprine (FLEXERIL) 10 MG tablet Take 1 tablet (10 mg total) by mouth 3 (three) times daily as needed for muscle spasms. 11/26/14  Yes Bethann Berkshire, MD  DULoxetine (CYMBALTA) 60 MG capsule Take 60 mg by mouth 2 (two) times daily.    Yes [provider]  fenofibrate (TRICOR) 145 MG tablet Take 145 mg by mouth daily.   Yes [provider]  furosemide (LASIX) 40 MG tablet Take 40 mg by mouth daily.   Yes [provider]  gabapentin (NEURONTIN) 400 MG capsule Take 800 mg by mouth 2 (two) times daily as needed.  10/30/14  Yes [provider]  HYDROcodone-acetaminophen (NORCO) 7.5-325 MG tablet Take 1 tablet by mouth 2 (two) times daily as needed for moderate pain.   Yes [provider]  ibuprofen (ADVIL,MOTRIN) 100 MG chewable tablet Chew 100  mg by mouth 2 (two) times daily.   Yes [provider]  L-Arginine 500 MG CAPS Take 1 capsule by mouth daily.   Yes [provider]  meloxicam (MOBIC) 7.5 MG tablet Take 7.5 mg by mouth 2 (two) times daily. 10/30/14  Yes [provider]  nicotine (NICODERM CQ - DOSED IN MG/24 HOURS) 21 mg/24hr patch Place 21 mg onto the skin daily.   Yes [provider]  pantoprazole (PROTONIX) 40 MG tablet Take 40 mg by mouth daily. 10/30/14  Yes [provider]  predniSONE (DELTASONE) 20 MG tablet Take 20 mg by mouth daily with breakfast.   Yes [provider]  traMADol (ULTRAM) 50 MG tablet Take 100 mg by mouth every 6  (six) hours as needed.   Yes [provider]  zolpidem (AMBIEN) 10 MG tablet Take 10 mg by mouth at bedtime. 10/30/14  Yes [provider]  clindamycin (CLEOCIN) 300 MG capsule Take 1 capsule (300 mg total) by mouth 4 (four) times daily. X 7 days Patient not taking: Reported on 06/12/2017 05/30/17   Bethann Berkshire, MD    Review of Systems:  Constitutional: Denies fever, chills, diaphoresis, appetite change and fatigue.  HEENT: Denies photophobia, eye pain, redness, hearing loss, ear pain, congestion, sore throat, rhinorrhea, sneezing, mouth sores, trouble swallowing, neck pain, neck stiffness and tinnitus.   Respiratory: Denies SOB, DOE, cough, chest tightness,  and wheezing.   Cardiovascular: Denies chest pain, palpitations and leg swelling.  Gastrointestinal: Denies nausea, vomiting, abdominal pain, diarrhea, constipation, blood in stool and abdominal distention.  Genitourinary: Denies dysuria, urgency, frequency, hematuria, flank pain and difficulty urinating.  Endocrine: Denies: hot or cold intolerance, sweats, changes in hair or nails, polyuria, polydipsia. Musculoskeletal: Denies myalgias, back pain,Skin: Denies pallor, rash and wound.  Neurological: Denies dizziness, seizures, syncope, weakness, light-headedness, numbness and headaches.  Hematological: Denies adenopathy. Easy bruising, personal or family bleeding history  Psychiatric/Behavioral: Denies suicidal ideation, mood changes, confusion, nervousness, sleep disturbance and agitation    Physical Exam: Vitals:   06/12/17 1110 06/12/17 1132 06/12/17 1410 06/12/17 1453  BP: 127/78 123/71 127/78 116/68  Pulse: 61 87 89 64  Resp: 18 20 18    Temp: 98.1 F (36.7 C) 99 F (37.2 C)  97.7 F (36.5 C)  TempSrc: Oral Oral  Oral  SpO2: 100% 96% 97% 100%  Weight: 99.8 kg (220 lb)   106.3 kg (234 lb 4.8 oz)  Height: 5\' 11"  (1.803 m)   5\' 10"  (1.778 m)     Constitutional: NAD, calm, comfortable Eyes: PERRL, lids and  conjunctivae normal ENMT: Mucous membranes are moist. Posterior pharynx clear of any exudate or lesions.Normal dentition.  Neck: normal, supple, no masses, no thyromegaly Respiratory: clear to auscultation bilaterally, no wheezing, no crackles. Normal respiratory effort. No accessory muscle use.  Cardiovascular: Regular rate and rhythm, no murmurs / rubs / gallops. No extremity edema. 2+ pedal pulses. No carotid bruits.  Abdomen: no tenderness, no masses palpated. No hepatosplenomegaly. Bowel sounds positive.  Musculoskeletal:        Skin: no rashes, lesions, ulcers. No induration Neurologic: CN 2-12 grossly intact. Sensation intact, DTR normal. Strength 5/5 in all 4.  Psychiatric: Normal judgment and insight. Alert and oriented x 3. Normal mood.    Labs on Admission: I have personally reviewed the following labs and imaging studies  CBC:  Recent Labs Lab 06/12/17 1220  WBC 8.9  NEUTROABS 5.5  HGB 11.8*  HCT 33.6*  MCV 87.5  PLT 221  Basic Metabolic Panel:  Recent Labs Lab 06/12/17 1220  NA 138  K 3.6  CL 104  CO2 25  GLUCOSE 121*  BUN 8  CREATININE 0.91  CALCIUM 8.8*   GFR: Estimated Creatinine Clearance: 117.2 mL/min (by C-G formula based on SCr of 0.91 mg/dL). Liver Function Tests:  Recent Labs Lab 06/12/17 1220  AST 30  ALT 38  ALKPHOS 61  BILITOT 0.7  PROT 6.4*  ALBUMIN 3.5   No results for input(s): LIPASE, AMYLASE in the last 168 hours. No results for input(s): AMMONIA in the last 168 hours. Coagulation Profile:  Recent Labs Lab 06/12/17 1220  INR 1.00   Cardiac Enzymes: No results for input(s): CKTOTAL, CKMB, CKMBINDEX, TROPONINI in the last 168 hours. BNP (last 3 results) No results for input(s): PROBNP in the last 8760 hours. HbA1C: No results for input(s): HGBA1C in the last 72 hours. CBG: No results for input(s): GLUCAP in the last 168 hours. Lipid Profile: No results for input(s): CHOL, HDL, LDLCALC, TRIG, CHOLHDL, LDLDIRECT  in the last 72 hours. Thyroid Function Tests: No results for input(s): TSH, T4TOTAL, FREET4, T3FREE, THYROIDAB in the last 72 hours. Anemia Panel: No results for input(s): VITAMINB12, FOLATE, FERRITIN, TIBC, IRON, RETICCTPCT in the last 72 hours. Urine analysis:    Component Value Date/Time   COLORURINE YELLOW 09/27/2010 0915   APPEARANCEUR CLEAR 09/27/2010 0915   LABSPEC 1.010 09/27/2010 0915   PHURINE 6.0 09/27/2010 0915   GLUCOSEU NEGATIVE 09/27/2010 0915   GLUCOSEU NEG mg/dL 40/98/119111/14/2008 [id number]   HGBUR NEGATIVE 09/27/2010 0915   HGBUR negative 10/17/2007 0854   BILIRUBINUR NEGATIVE 09/27/2010 0915   KETONESUR NEGATIVE 09/27/2010 0915   PROTEINUR NEGATIVE 09/27/2010 0915   UROBILINOGEN 0.2 09/27/2010 0915   NITRITE NEGATIVE 09/27/2010 0915   LEUKOCYTESUR  09/27/2010 0915    NEGATIVE MICROSCOPIC NOT DONE ON URINES WITH NEGATIVE PROTEIN, BLOOD, LEUKOCYTES, NITRITE, OR GLUCOSE <1000 mg/dL.   Sepsis Labs: @LABRCNTIP (procalcitonin:4,lacticidven:4) ) Recent Results (from the past 240 hour(s))  Culture, blood (Routine X 2) w Reflex to ID Panel     Status: None (Preliminary result)   Collection Time: 06/12/17 12:20 PM  Result Value Ref Range Status   Specimen Description BLOOD RIGHT ANTECUBITAL  Final   Special Requests   Final    BOTTLES DRAWN AEROBIC AND ANAEROBIC Blood Culture results may not be optimal due to an inadequate volume of blood received in culture bottles   Culture PENDING  Incomplete   Report Status PENDING  Incomplete  Culture, blood (Routine X 2) w Reflex to ID Panel     Status: None (Preliminary result)   Collection Time: 06/12/17 12:24 PM  Result Value Ref Range Status   Specimen Description BLOOD LEFT ANTECUBITAL  Final   Special Requests   Final    BOTTLES DRAWN AEROBIC AND ANAEROBIC Blood Culture results may not be optimal due to an inadequate volume of blood received in culture bottles   Culture PENDING  Incomplete   Report Status PENDING  Incomplete      Radiological Exams on Admission: No results found.  EKG: Independently reviewed. None obtained in the ED  Assessment/Plan Principal Problem:   Cellulitis Active Problems:   DISORDER, BIPOLAR NOS    Bilateral Lower Extremity Cellulitis -My main concern at this point is for cat scratch/bite disease and contamination with pasteurella as he failed a 10 day course of clindamycin. -Received vanc in the ED. No further. -Will start on doxy/flagyl for cat bite coverage given inability to use  augmentin due to a PCN allergy. -Monitor response while in the hospital. -Altho no signs of systemic toxicity, cellulitis is quite impressive in its extent and the fact that it is bilateral to the point where I believe he would benefit from 1-2 nights of hospitalization for observation.  Bipolar Disorder -Mood is stable. -Continue home meds. -Should follow up OP with psych as scheduled.   DVT prophylaxis: lovenox  Code Status: full code  Family Communication: patient only  Disposition Plan: anticipate DC home in 24-48 hours  Consults called: None  Admission status: observation    Time Spent: 75 minutes  Chaya Jan MD [facility] Pager [phone]  If 7PM-7AM, please contact night-coverage [email or web address] Password [facility]  06/12/2017, 4:04 PM

## 2017-06-12 NOTE — ED Notes (Signed)
Pt has not taken any of previous prescribed medications (lasix, prednisone).

## 2017-06-12 NOTE — Progress Notes (Signed)
Patient stating that he had a reaction to lovenox, that it made him shake and caused a HA.  Insisting that he be on baby ASA.  When going back into patient rooms to check on him, patient appears relaxed, no shaking noted - no other s/s of a rx noted. Explained that he has the right to refuse the injection if he is not comfortable taking it.  Explained again that the lovenox is used to prevent blood blots while in the hospital, that baby ASA was not sufficient.

## 2017-06-12 NOTE — ED Triage Notes (Signed)
Patient states she was bitten on both legs by snake last Tuesday. States was seen here and by PCP. Patient says that the pain, redness and swelling has gotten worse since initial bite. Bilateral swelling up to knees with redness and pain.

## 2017-06-13 DIAGNOSIS — L03119 Cellulitis of unspecified part of limb: Secondary | ICD-10-CM | POA: Diagnosis not present

## 2017-06-13 DIAGNOSIS — F3113 Bipolar disorder, current episode manic without psychotic features, severe: Secondary | ICD-10-CM | POA: Diagnosis not present

## 2017-06-13 DIAGNOSIS — M7989 Other specified soft tissue disorders: Secondary | ICD-10-CM | POA: Diagnosis not present

## 2017-06-13 DIAGNOSIS — L03115 Cellulitis of right lower limb: Secondary | ICD-10-CM | POA: Diagnosis not present

## 2017-06-13 LAB — BASIC METABOLIC PANEL
Anion gap: 7 (ref 5–15)
BUN: 8 mg/dL (ref 6–20)
CO2: 26 mmol/L (ref 22–32)
Calcium: 8.6 mg/dL — ABNORMAL LOW (ref 8.9–10.3)
Chloride: 106 mmol/L (ref 101–111)
Creatinine, Ser: 0.76 mg/dL (ref 0.61–1.24)
GFR calc Af Amer: >60 mL/min (ref >60)
GFR calc non Af Amer: >60 mL/min (ref >60)
Glucose, Bld: 103 mg/dL — ABNORMAL HIGH (ref 65–99)
Potassium: 3.9 mmol/L (ref 3.5–5.1)
Sodium: 139 mmol/L (ref 135–145)

## 2017-06-13 LAB — CBC
HCT: 35.7 % — ABNORMAL LOW (ref 39.0–52.0)
Hemoglobin: 12.3 g/dL — ABNORMAL LOW (ref 13.0–17.0)
MCH: 30.5 pg (ref 26.0–34.0)
MCHC: 34.5 g/dL (ref 30.0–36.0)
MCV: 88.6 fL (ref 78.0–100.0)
Platelets: 217 10*3/uL (ref 150–400)
RBC: 4.03 MIL/uL — ABNORMAL LOW (ref 4.22–5.81)
RDW: 13.1 % (ref 11.5–15.5)
WBC: 8 10*3/uL (ref 4.0–10.5)

## 2017-06-13 MED ORDER — DOXYCYCLINE HYCLATE 100 MG PO TABS: 100.0000 mg | ORAL_TABLET | Freq: Two times a day (BID) | ORAL | 0 refills | Status: AC

## 2017-06-13 MED ORDER — METRONIDAZOLE 500 MG PO TABS: 500.0000 mg | ORAL_TABLET | Freq: Three times a day (TID) | ORAL | 0 refills | Status: AC

## 2017-06-13 NOTE — Care Management Note (Signed)
Case Management Note  Patient Details  Name: [name] [name] MRN: [id number] Date of Birth: 07/22/1966  Subjective/Objective:                  Pt admitted with cellulitis. Chart reviewed for CM needs. Pt is from home, lives with S/O. He is ind with ADL's,. Has PCP, transportation and insurance with drug coverage.   Action/Plan: Anticipate pt will return home with self care. No CM needs anticipated, CM will follow to DC.   Expected Discharge Date:     06/14/2017             Expected Discharge Plan:  Home/Self Care  In-House Referral:  NA  Discharge planning Services  CM Consult  Post Acute Care Choice:  NA Choice offered to:  NA  Status of Service:  Completed, signed off   Malcolm MetroChildress, Cristabel Bicknell Demske, RN 06/13/2017, 9:56 AM
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
## 2017-06-14 LAB — HIV ANTIBODY (ROUTINE TESTING W REFLEX): HIV Screen 4th Generation wRfx: NONREACTIVE

## 2017-06-17 LAB — CULTURE, BLOOD (ROUTINE X 2)
Culture: NO GROWTH
Culture: NO GROWTH

## 2017-06-27 DIAGNOSIS — Z09 Encounter for follow-up examination after completed treatment for conditions other than malignant neoplasm: Secondary | ICD-10-CM | POA: Diagnosis not present

## 2017-06-27 DIAGNOSIS — G894 Chronic pain syndrome: Secondary | ICD-10-CM | POA: Diagnosis not present

## 2017-06-27 DIAGNOSIS — L03115 Cellulitis of right lower limb: Secondary | ICD-10-CM | POA: Diagnosis not present

## 2017-06-27 DIAGNOSIS — L03116 Cellulitis of left lower limb: Secondary | ICD-10-CM | POA: Diagnosis not present

## 2017-06-27 DIAGNOSIS — E781 Pure hyperglyceridemia: Secondary | ICD-10-CM | POA: Diagnosis not present

## 2017-07-13 DIAGNOSIS — Z79899 Other long term (current) drug therapy: Secondary | ICD-10-CM | POA: Diagnosis not present

## 2017-07-25 DIAGNOSIS — N529 Male erectile dysfunction, unspecified: Secondary | ICD-10-CM | POA: Diagnosis not present

## 2017-07-25 DIAGNOSIS — G629 Polyneuropathy, unspecified: Secondary | ICD-10-CM | POA: Diagnosis not present

## 2017-07-25 DIAGNOSIS — R609 Edema, unspecified: Secondary | ICD-10-CM | POA: Diagnosis not present

## 2017-07-25 DIAGNOSIS — Z6835 Body mass index (BMI) 35.0-35.9, adult: Secondary | ICD-10-CM | POA: Diagnosis not present

## 2017-07-25 DIAGNOSIS — Z5181 Encounter for therapeutic drug level monitoring: Secondary | ICD-10-CM | POA: Diagnosis not present

## 2017-07-25 DIAGNOSIS — G894 Chronic pain syndrome: Secondary | ICD-10-CM | POA: Diagnosis not present

## 2017-07-25 DIAGNOSIS — R635 Abnormal weight gain: Secondary | ICD-10-CM | POA: Diagnosis not present

## 2017-08-08 DIAGNOSIS — G629 Polyneuropathy, unspecified: Secondary | ICD-10-CM | POA: Diagnosis not present

## 2017-08-08 DIAGNOSIS — R0602 Shortness of breath: Secondary | ICD-10-CM | POA: Diagnosis not present

## 2017-08-08 DIAGNOSIS — Z5181 Encounter for therapeutic drug level monitoring: Secondary | ICD-10-CM | POA: Diagnosis not present

## 2017-08-08 DIAGNOSIS — R609 Edema, unspecified: Secondary | ICD-10-CM | POA: Diagnosis not present

## 2017-08-18 DIAGNOSIS — R600 Localized edema: Secondary | ICD-10-CM | POA: Diagnosis not present

## 2017-08-18 DIAGNOSIS — L03119 Cellulitis of unspecified part of limb: Secondary | ICD-10-CM | POA: Diagnosis not present

## 2017-08-22 DIAGNOSIS — Z5181 Encounter for therapeutic drug level monitoring: Secondary | ICD-10-CM | POA: Diagnosis not present

## 2017-08-22 DIAGNOSIS — N529 Male erectile dysfunction, unspecified: Secondary | ICD-10-CM | POA: Diagnosis not present

## 2017-11-15 DIAGNOSIS — G894 Chronic pain syndrome: Secondary | ICD-10-CM | POA: Diagnosis not present

## 2017-11-15 DIAGNOSIS — N529 Male erectile dysfunction, unspecified: Secondary | ICD-10-CM | POA: Diagnosis not present

## 2018-06-30 DIAGNOSIS — Z0000 Encounter for general adult medical examination without abnormal findings: Secondary | ICD-10-CM | POA: Diagnosis not present

## 2018-06-30 DIAGNOSIS — E559 Vitamin D deficiency, unspecified: Secondary | ICD-10-CM | POA: Diagnosis not present

## 2018-06-30 DIAGNOSIS — F330 Major depressive disorder, recurrent, mild: Secondary | ICD-10-CM | POA: Diagnosis not present

## 2018-06-30 DIAGNOSIS — Z1211 Encounter for screening for malignant neoplasm of colon: Secondary | ICD-10-CM | POA: Diagnosis not present

## 2018-06-30 DIAGNOSIS — G894 Chronic pain syndrome: Secondary | ICD-10-CM | POA: Diagnosis not present

## 2018-06-30 DIAGNOSIS — H3530 Unspecified macular degeneration: Secondary | ICD-10-CM | POA: Diagnosis not present

## 2018-06-30 DIAGNOSIS — Z6837 Body mass index (BMI) 37.0-37.9, adult: Secondary | ICD-10-CM | POA: Diagnosis not present

## 2018-06-30 DIAGNOSIS — I10 Essential (primary) hypertension: Secondary | ICD-10-CM | POA: Diagnosis not present

## 2018-06-30 DIAGNOSIS — E781 Pure hyperglyceridemia: Secondary | ICD-10-CM | POA: Diagnosis not present

## 2018-06-30 DIAGNOSIS — Z125 Encounter for screening for malignant neoplasm of prostate: Secondary | ICD-10-CM | POA: Diagnosis not present

## 2019-01-23 ENCOUNTER — Encounter (HOSPITAL_COMMUNITY): Payer: Self-pay | Admitting: Emergency Medicine

## 2019-01-23 ENCOUNTER — Emergency Department (HOSPITAL_COMMUNITY): Payer: PPO

## 2019-01-23 ENCOUNTER — Emergency Department (HOSPITAL_COMMUNITY)
Admission: EM | Admit: 2019-01-23 | Discharge: 2019-01-23 | Disposition: A | Discharge status: Home / Self Care | Payer: PPO | Attending: Emergency Medicine | Admitting: Emergency Medicine

## 2019-01-23 ENCOUNTER — Other Ambulatory Visit: Payer: Self-pay

## 2019-01-23 DIAGNOSIS — Y9389 Activity, other specified: Secondary | ICD-10-CM | POA: Diagnosis not present

## 2019-01-23 DIAGNOSIS — Y9289 Other specified places as the place of occurrence of the external cause: Secondary | ICD-10-CM | POA: Insufficient documentation

## 2019-01-23 DIAGNOSIS — S299XXA Unspecified injury of thorax, initial encounter: Secondary | ICD-10-CM | POA: Diagnosis present

## 2019-01-23 DIAGNOSIS — S2242XA Multiple fractures of ribs, left side, initial encounter for closed fracture: Secondary | ICD-10-CM | POA: Diagnosis not present

## 2019-01-23 DIAGNOSIS — Y998 Other external cause status: Secondary | ICD-10-CM | POA: Insufficient documentation
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
## 2019-02-12 ENCOUNTER — Other Ambulatory Visit: Payer: Self-pay

## 2019-02-12 NOTE — Patient Outreach (Signed)
[facility]) Care Management  02/12/2019  [name] [name] 10/03/1966 [id number]   TELEPHONE SCREENING Referral date: 01/25/19 Referral source: utilization management Referral reason: medication and transportation assistance Insurance: [employer] Attempt #1  Telephone call to patient regarding utilization management referral. Unable to reach patient. HIPAA compliant voice message left with call back phone number.   PLAN: RNCM will attempt 2nd telephone call to patient within 4 business days. RNCM will send outreach letter.   [name] RN,BSN, CCM [facility] Telephonic  [phone]

## 2019-02-14 ENCOUNTER — Other Ambulatory Visit: Payer: Self-pay

## 2019-02-14 NOTE — Patient Outreach (Signed)
[facility]) Care Management  02/14/2019  [name] [name] [birth year] [id number]  TELEPHONE SCREENING Referral date: 01/25/19 Referral source: utilization management Referral reason: medication and transportation assistance Insurance: [employer] advantage  Telephone call to patient regarding utilization management referral. HIPAA verified with patient. Explained reason for call. Patient states he no longer has [employer] advantage insurance. Patient states he switched insurance and his effective date was 02/12/19.  Patient states he does not need transportation assistance. Patient voices no additional needs at this time.   PLAN; RNCm will close patient due to patient not being eligible for benefit.  RNCM will send patients primary MD closure letter.   [name] RN,BSN,CCM [facility] Telephonic  [phone]

## 2019-05-01 ENCOUNTER — Encounter (HOSPITAL_COMMUNITY): Payer: Self-pay | Admitting: Emergency Medicine

## 2019-05-01 ENCOUNTER — Emergency Department (HOSPITAL_COMMUNITY)
Admission: EM | Admit: 2019-05-01 | Discharge: 2019-05-01 | Disposition: A | Discharge status: Home / Self Care | Payer: PPO | Attending: Emergency Medicine | Admitting: Emergency Medicine

## 2019-05-01 ENCOUNTER — Other Ambulatory Visit: Payer: Self-pay

## 2019-05-01 DIAGNOSIS — R569 Unspecified convulsions: Secondary | ICD-10-CM | POA: Insufficient documentation

## 2019-05-01 DIAGNOSIS — R4020 Unspecified coma: Secondary | ICD-10-CM | POA: Diagnosis not present

## 2019-05-01 DIAGNOSIS — R000 Tachycardia, unspecified: Secondary | ICD-10-CM | POA: Diagnosis not present

## 2019-05-01 DIAGNOSIS — R42 Dizziness and giddiness: Secondary | ICD-10-CM | POA: Diagnosis not present

## 2019-05-01 DIAGNOSIS — Z79899 Other long term (current) drug therapy: Secondary | ICD-10-CM | POA: Diagnosis not present

## 2019-05-01 DIAGNOSIS — F17210 Nicotine dependence, cigarettes, uncomplicated: Secondary | ICD-10-CM | POA: Insufficient documentation

## 2019-05-01 DIAGNOSIS — R0689 Other abnormalities of breathing: Secondary | ICD-10-CM | POA: Diagnosis not present

## 2019-05-01 DIAGNOSIS — G40909 Epilepsy, unspecified, not intractable, without status epilepticus: Secondary | ICD-10-CM | POA: Diagnosis not present

## 2019-05-01 DIAGNOSIS — R410 Disorientation, unspecified: Secondary | ICD-10-CM | POA: Diagnosis not present

## 2019-05-01 LAB — CBC WITH DIFFERENTIAL/PLATELET
Abs Immature Granulocytes: 0.09 10*3/uL — ABNORMAL HIGH (ref 0.00–0.07)
Basophils Absolute: 0 10*3/uL (ref 0.0–0.1)
Basophils Relative: 0 %
Eosinophils Absolute: 0.1 10*3/uL (ref 0.0–0.5)
Eosinophils Relative: 1 %
HCT: 40 % (ref 39.0–52.0)
Hemoglobin: 13.5 g/dL (ref 13.0–17.0)
Immature Granulocytes: 1 %
Lymphocytes Relative: 13 %
Lymphs Abs: 1.7 10*3/uL (ref 0.7–4.0)
MCH: 28.8 pg (ref 26.0–34.0)
MCHC: 33.8 g/dL (ref 30.0–36.0)
MCV: 85.3 fL (ref 80.0–100.0)
Monocytes Absolute: 0.5 10*3/uL (ref 0.1–1.0)
Monocytes Relative: 4 %
Neutro Abs: 10.6 10*3/uL — ABNORMAL HIGH (ref 1.7–7.7)
Neutrophils Relative %: 81 %
Platelets: 263 10*3/uL (ref 150–400)
RBC: 4.69 MIL/uL (ref 4.22–5.81)
RDW: 12.9 % (ref 11.5–15.5)
WBC: 13 10*3/uL — ABNORMAL HIGH (ref 4.0–10.5)
nRBC: 0 % (ref 0.0–0.2)

## 2019-05-01 LAB — RAPID URINE DRUG SCREEN, HOSP PERFORMED
Amphetamines: NOT DETECTED
Barbiturates: NOT DETECTED
Benzodiazepines: NOT DETECTED
Cocaine: POSITIVE — AB
Opiates: NOT DETECTED
Tetrahydrocannabinol: NOT DETECTED

## 2019-05-01 LAB — COMPREHENSIVE METABOLIC PANEL
ALT: 19 U/L (ref 0–44)
AST: 26 U/L (ref 15–41)
Albumin: 3.7 g/dL (ref 3.5–5.0)
Alkaline Phosphatase: 89 U/L (ref 38–126)
Anion gap: 10 (ref 5–15)
BUN: 9 mg/dL (ref 6–20)
CO2: 22 mmol/L (ref 22–32)
Calcium: 8.8 mg/dL — ABNORMAL LOW (ref 8.9–10.3)
Chloride: 101 mmol/L (ref 98–111)
Creatinine, Ser: 1.02 mg/dL (ref 0.61–1.24)
GFR calc Af Amer: >60 mL/min (ref >60)
GFR calc non Af Amer: >60 mL/min (ref >60)
Glucose, Bld: 118 mg/dL — ABNORMAL HIGH (ref 70–99)
Potassium: 4.4 mmol/L (ref 3.5–5.1)
Sodium: 133 mmol/L — ABNORMAL LOW (ref 135–145)
Total Bilirubin: 0.9 mg/dL (ref 0.3–1.2)
Total Protein: 7.2 g/dL (ref 6.5–8.1)

## 2019-05-01 LAB — URINALYSIS, ROUTINE W REFLEX MICROSCOPIC
Bilirubin Urine: NEGATIVE
Glucose, UA: NEGATIVE mg/dL
Hgb urine dipstick: NEGATIVE
Ketones, ur: NEGATIVE mg/dL
Leukocytes,Ua: NEGATIVE
Nitrite: NEGATIVE
Protein, ur: NEGATIVE mg/dL
Specific Gravity, Urine: 1.005 (ref 1.005–1.030)
pH: 7 (ref 5.0–8.0)

## 2019-05-01 LAB — MAGNESIUM: Magnesium: 2.3 mg/dL (ref 1.7–2.4)

## 2019-05-01 LAB — ETHANOL: Alcohol, Ethyl (B): <10 mg/dL (ref <10)

## 2019-05-01 LAB — CBG MONITORING, ED: Glucose-Capillary: 90 mg/dL (ref 70–99)

## 2019-05-01 MED ORDER — SODIUM CHLORIDE 0.9 % IV BOLUS
1000.0000 mL | Freq: Once | INTRAVENOUS | Status: AC
  Administered 2019-05-01: 1000 mL via INTRAVENOUS

## 2019-05-01 MED ORDER — LORAZEPAM 2 MG/ML IJ SOLN
1.5000 mg | Freq: Once | INTRAMUSCULAR | Status: AC
  Administered 2019-05-01: 1.5 mg via INTRAVENOUS
  Filled 2019-05-01: qty 1
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
## 2019-05-01 NOTE — ED Notes (Signed)
ED Provider at bedside. 
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
## 2019-05-04 NOTE — ED Provider Notes (Signed)
[facility] EMERGENCY DEPARTMENT Provider Note   CSN: [id number] Arrival date & time: 05/01/19  2707    History   Chief Complaint Chief Complaint  Patient presents with  . Seizures    HPI [name] [name] is a 53 y.o. male.  HPI   53yM with possible seizure. Says he was riding in the car with his mother after just eating a buttermilk fried chicken biscuit when he apparently had an episode of shaking and seemed confused. Pt says he remembers feeling like his heart was racing and felt warm. No pain. No dyspnea. He is not sure if he passed out or not. No oral trauma or incontinence.   Past Medical History:  Diagnosis Date  . Anxiety   . Chronic fatigue   . Chronic pain   . Fibromyalgia     Patient Active Problem List   Diagnosis Date Noted  . Cellulitis 06/12/2017  . GASTROENTERITIS, ACUTE 01/22/2008  . FEVER UNSPECIFIED 11/10/2007  . DIZZINESS 11/06/2007  . HYPERLIPIDEMIA 04/26/2007  . DISORDER, BIPOLAR NOS 04/26/2007  . DEPRESSION 04/26/2007  . ALLERGIC RHINITIS 04/26/2007    Past Surgical History:  Procedure Laterality Date  . KNEE ARTHROSCOPY Left 2006   miniscus tear  . LYMPH GLAND EXCISION Left 1983  . TONSILLECTOMY          Home Medications    Prior to Admission medications   Medication Sig Start Date End Date Taking? Authorizing Provider  amitriptyline (ELAVIL) 50 MG tablet Take 150-200 mg by mouth at bedtime.   Yes [provider]  ARIPiprazole (ABILIFY) 5 MG tablet Take 5 mg by mouth at bedtime.   Yes [provider]  clonazePAM (KLONOPIN) 2 MG tablet Take 2 mg by mouth 3 (three) times daily as needed for anxiety.   Yes [provider]  gabapentin (NEURONTIN) 400 MG capsule Take 800 mg by mouth 2 (two) times daily. And an additional 800 mg as needed. 10/30/14  Yes [provider]  HYDROcodone-acetaminophen (NORCO) 7.5-325 MG tablet Take 1 tablet by mouth every 6 (six) hours as needed for moderate pain.    Yes [provider]  pravastatin (PRAVACHOL) 10 MG tablet Take 10 mg by mouth daily.   Yes [provider]  traMADol (ULTRAM) 50 MG tablet Take 100 mg by mouth every 6 (six) hours as needed.   Yes [provider]  zolpidem (AMBIEN) 10 MG tablet Take 10 mg by mouth at bedtime. 10/30/14  Yes [provider]    Family History Family History  Problem Relation Age of Onset  . Diabetes Mother   . Heart attack Father   . Diabetes Maternal Grandmother     Social History Social History   Tobacco Use  . Smoking status: Current Every Day Smoker    Packs/day: 0.25  . Smokeless tobacco: Never Used  Substance Use Topics  . Alcohol use: Yes    Comment: occasional  . Drug use: No     Allergies   Demerol [meperidine]; Levaquin [levofloxacin in d5w]; Morphine and related; Other; and Penicillins   Review of Systems Review of Systems  All systems reviewed and negative, other than as noted in HPI.  Physical Exam Updated Vital Signs BP (!) 142/85   Pulse (!) 124   Temp 99.5 F (37.5 C)   Resp 20   Ht 5\' 11"  (1.803 m)   Wt 105.2 kg   SpO2 93%   BMI 32.36 kg/m   Physical Exam Vitals signs and nursing  note reviewed.  Constitutional:      General: He is not in acute distress.    Appearance: He is well-developed.  HENT:     Head: Normocephalic and atraumatic.  Eyes:     General:        Right eye: No discharge.        Left eye: No discharge.     Conjunctiva/sclera: Conjunctivae normal.  Neck:     Musculoskeletal: Neck supple.  Cardiovascular:     Rate and Rhythm: Regular rhythm. Tachycardia present.     Heart sounds: Normal heart sounds. No murmur. No friction rub. No gallop.   Pulmonary:     Effort: Pulmonary effort is normal. No respiratory distress.     Breath sounds: Normal breath sounds.  Abdominal:     General: There is no distension.     Palpations: Abdomen is soft.     Tenderness: There is no abdominal tenderness.  Musculoskeletal:         General: No tenderness.     Comments: Lower extremities symmetric as compared to each other. No calf tenderness. Negative Homan's. No palpable cords.   Skin:    General: Skin is warm and dry.  Neurological:     Mental Status: He is alert.     Comments: Mild tremor.   Psychiatric:        Thought Content: Thought content normal.      ED Treatments / Results  Labs (all labs ordered are listed, but only abnormal results are displayed) Labs Reviewed  COMPREHENSIVE METABOLIC PANEL - Abnormal; Notable for the following components:      Result Value   Sodium 133 (*)    Glucose, Bld 118 (*)    Calcium 8.8 (*)    All other components within normal limits  URINALYSIS, ROUTINE W REFLEX MICROSCOPIC - Abnormal; Notable for the following components:   Color, Urine STRAW (*)    All other components within normal limits  RAPID URINE DRUG SCREEN, HOSP PERFORMED - Abnormal; Notable for the following components:   Cocaine POSITIVE (*)    All other components within normal limits  CBC WITH DIFFERENTIAL/PLATELET - Abnormal; Notable for the following components:   WBC 13.0 (*)    Neutro Abs 10.6 (*)    Abs Immature Granulocytes 0.09 (*)    All other components within normal limits  MAGNESIUM  ETHANOL  CBC WITH DIFFERENTIAL/PLATELET  CBG MONITORING, ED    EKG EKG Interpretation  Date/Time:  Tuesday May 01 2019 10:02:22 EDT Ventricular Rate:  129 PR Interval:    QRS Duration: 94 QT Interval:  295 QTC Calculation: 433 R Axis:   19 Text Interpretation:  Sinus tachycardia Ventricular premature complex Aberrant complex Low voltage, precordial leads Confirmed by Raeford RazorKohut, Hanz Winterhalter [phone]([id number]) on 05/01/2019 10:33:07 AM   Radiology No results found.  Procedures Procedures (including critical care time)  Medications Ordered in ED Medications  sodium chloride 0.9 % bolus 1,000 mL (0 mLs Intravenous Stopped 05/01/19 1210)  LORazepam (ATIVAN) injection 1.5 mg (1.5 mg Intravenous Given 05/01/19 1047)      Initial Impression / Assessment and Plan / ED Course  I have reviewed the triage vital signs and the nursing notes.  Pertinent labs & imaging results that were available during my care of the patient were reviewed by me and considered in my medical decision making (see chart for details).        53yM with questionable seizure like activity. Now no complaints. Persistently tachycardic but cocaine positive.  On speaking with him further, he admits to drug use last night but denies any today. Regardless, at this point I think he is stable for DC. Counseled on dangers of drug use.   It has been determined that no acute conditions requiring further emergency intervention are present at this time. The patient has been advised of the diagnosis and plan. I reviewed any labs and imaging including any potential incidental findings. I have reviewed nursing notes and appropriate previous records. We have discussed signs and symptoms that warrant return to the ED and they are listed in the discharge instructions.      Final Clinical Impressions(s) / ED Diagnoses   Final diagnoses:  Seizure-like activity [facility])    ED Discharge Orders    None       Raeford Razor, MD 05/04/19 1844

## 2019-07-06 DIAGNOSIS — E559 Vitamin D deficiency, unspecified: Secondary | ICD-10-CM | POA: Diagnosis not present

## 2019-07-06 DIAGNOSIS — G894 Chronic pain syndrome: Secondary | ICD-10-CM | POA: Diagnosis not present

## 2019-07-06 DIAGNOSIS — H3530 Unspecified macular degeneration: Secondary | ICD-10-CM | POA: Diagnosis not present

## 2019-07-06 DIAGNOSIS — F330 Major depressive disorder, recurrent, mild: Secondary | ICD-10-CM | POA: Diagnosis not present

## 2019-07-06 DIAGNOSIS — Z1211 Encounter for screening for malignant neoplasm of colon: Secondary | ICD-10-CM | POA: Diagnosis not present

## 2019-07-06 DIAGNOSIS — I10 Essential (primary) hypertension: Secondary | ICD-10-CM | POA: Diagnosis not present

## 2019-07-06 DIAGNOSIS — Z0000 Encounter for general adult medical examination without abnormal findings: Secondary | ICD-10-CM | POA: Diagnosis not present

## 2019-07-06 DIAGNOSIS — Z125 Encounter for screening for malignant neoplasm of prostate: Secondary | ICD-10-CM | POA: Diagnosis not present

## 2019-07-06 DIAGNOSIS — G629 Polyneuropathy, unspecified: Secondary | ICD-10-CM | POA: Diagnosis not present

## 2019-07-06 DIAGNOSIS — E781 Pure hyperglyceridemia: Secondary | ICD-10-CM | POA: Diagnosis not present

## 2019-08-03 ENCOUNTER — Emergency Department (HOSPITAL_COMMUNITY): Payer: PPO

## 2019-08-03 ENCOUNTER — Emergency Department (HOSPITAL_COMMUNITY): Payer: PPO | Admitting: Anesthesiology

## 2019-08-03 ENCOUNTER — Inpatient Hospital Stay (HOSPITAL_COMMUNITY)
Admission: EM | Admit: 2019-08-03 | Discharge: 2019-08-11 | DRG: 326 | Disposition: A | Discharge status: Home Health | Payer: PPO | Attending: General Surgery | Admitting: General Surgery

## 2019-08-03 ENCOUNTER — Encounter (HOSPITAL_COMMUNITY)
Admission: EM | Disposition: A | Discharge status: Home Health | Payer: Self-pay | Source: Home / Self Care | Attending: General Surgery

## 2019-08-03 ENCOUNTER — Encounter (HOSPITAL_COMMUNITY): Payer: Self-pay

## 2019-08-03 ENCOUNTER — Other Ambulatory Visit: Payer: Self-pay

## 2019-08-03 DIAGNOSIS — X58XXXA Exposure to other specified factors, initial encounter: Secondary | ICD-10-CM | POA: Diagnosis present

## 2019-08-03 DIAGNOSIS — R000 Tachycardia, unspecified: Secondary | ICD-10-CM | POA: Diagnosis not present

## 2019-08-03 DIAGNOSIS — F1510 Other stimulant abuse, uncomplicated: Secondary | ICD-10-CM | POA: Diagnosis not present

## 2019-08-03 DIAGNOSIS — K660 Peritoneal adhesions (postprocedural) (postinfection): Secondary | ICD-10-CM | POA: Diagnosis not present

## 2019-08-03 DIAGNOSIS — K631 Perforation of intestine (nontraumatic): Secondary | ICD-10-CM | POA: Diagnosis not present

## 2019-08-03 DIAGNOSIS — Z880 Allergy status to penicillin: Secondary | ICD-10-CM

## 2019-08-03 DIAGNOSIS — K567 Ileus, unspecified: Secondary | ICD-10-CM | POA: Diagnosis not present

## 2019-08-03 DIAGNOSIS — K658 Other peritonitis: Secondary | ICD-10-CM | POA: Diagnosis not present

## 2019-08-03 DIAGNOSIS — K56601 Complete intestinal obstruction, unspecified as to cause: Secondary | ICD-10-CM

## 2019-08-03 DIAGNOSIS — J3489 Other specified disorders of nose and nasal sinuses: Secondary | ICD-10-CM | POA: Diagnosis not present

## 2019-08-03 DIAGNOSIS — K429 Umbilical hernia without obstruction or gangrene: Secondary | ICD-10-CM | POA: Diagnosis present

## 2019-08-03 DIAGNOSIS — Z79899 Other long term (current) drug therapy: Secondary | ICD-10-CM

## 2019-08-03 DIAGNOSIS — Z885 Allergy status to narcotic agent status: Secondary | ICD-10-CM

## 2019-08-03 DIAGNOSIS — Z881 Allergy status to other antibiotic agents status: Secondary | ICD-10-CM

## 2019-08-03 DIAGNOSIS — K56691 Other complete intestinal obstruction: Secondary | ICD-10-CM

## 2019-08-03 DIAGNOSIS — K6289 Other specified diseases of anus and rectum: Secondary | ICD-10-CM | POA: Diagnosis not present

## 2019-08-03 DIAGNOSIS — F17210 Nicotine dependence, cigarettes, uncomplicated: Secondary | ICD-10-CM | POA: Diagnosis not present

## 2019-08-03 DIAGNOSIS — Z20828 Contact with and (suspected) exposure to other viral communicable diseases: Secondary | ICD-10-CM | POA: Diagnosis present

## 2019-08-03 DIAGNOSIS — F319 Bipolar disorder, unspecified: Secondary | ICD-10-CM | POA: Diagnosis present

## 2019-08-03 DIAGNOSIS — T185XXA Foreign body in anus and rectum, initial encounter: Principal | ICD-10-CM | POA: Diagnosis present

## 2019-08-03 DIAGNOSIS — K9189 Other postprocedural complications and disorders of digestive system: Secondary | ICD-10-CM

## 2019-08-03 DIAGNOSIS — S3663XA Laceration of rectum, initial encounter: Secondary | ICD-10-CM | POA: Diagnosis not present

## 2019-08-03 DIAGNOSIS — E785 Hyperlipidemia, unspecified: Secondary | ICD-10-CM | POA: Diagnosis present

## 2019-08-03 DIAGNOSIS — F17200 Nicotine dependence, unspecified, uncomplicated: Secondary | ICD-10-CM | POA: Diagnosis present

## 2019-08-03 DIAGNOSIS — F419 Anxiety disorder, unspecified: Secondary | ICD-10-CM | POA: Diagnosis not present

## 2019-08-03 DIAGNOSIS — M797 Fibromyalgia: Secondary | ICD-10-CM | POA: Diagnosis not present

## 2019-08-03 DIAGNOSIS — Z8249 Family history of ischemic heart disease and other diseases of the circulatory system: Secondary | ICD-10-CM | POA: Diagnosis not present

## 2019-08-03 DIAGNOSIS — K6389 Other specified diseases of intestine: Secondary | ICD-10-CM | POA: Diagnosis present

## 2019-08-03 DIAGNOSIS — Z833 Family history of diabetes mellitus: Secondary | ICD-10-CM | POA: Diagnosis not present

## 2019-08-03 DIAGNOSIS — F1210 Cannabis abuse, uncomplicated: Secondary | ICD-10-CM | POA: Diagnosis present

## 2019-08-03 DIAGNOSIS — F1410 Cocaine abuse, uncomplicated: Secondary | ICD-10-CM | POA: Diagnosis present

## 2019-08-03 DIAGNOSIS — Z03818 Encounter for observation for suspected exposure to other biological agents ruled out: Secondary | ICD-10-CM | POA: Diagnosis not present

## 2019-08-03 DIAGNOSIS — S3669XA Other injury of rectum, initial encounter: Secondary | ICD-10-CM | POA: Diagnosis not present

## 2019-08-03 HISTORY — PX: COLOSTOMY: SHX63

## 2019-08-03 HISTORY — PX: FLEXIBLE SIGMOIDOSCOPY: SHX5431

## 2019-08-03 HISTORY — DX: Other peritonitis: K65.8

## 2019-08-03 HISTORY — PX: COLON RESECTION SIGMOID: SHX6737

## 2019-08-03 HISTORY — DX: Perforation of intestine (nontraumatic): K63.1

## 2019-08-03 HISTORY — PX: LAPAROTOMY: SHX154

## 2019-08-03 LAB — BASIC METABOLIC PANEL
Anion gap: 10 (ref 5–15)
BUN: 29 mg/dL — ABNORMAL HIGH (ref 6–20)
CO2: 24 mmol/L (ref 22–32)
Calcium: 8.3 mg/dL — ABNORMAL LOW (ref 8.9–10.3)
Chloride: 92 mmol/L — ABNORMAL LOW (ref 98–111)
Creatinine, Ser: 1.1 mg/dL (ref 0.61–1.24)
GFR calc Af Amer: >60 mL/min (ref >60)
GFR calc non Af Amer: >60 mL/min (ref >60)
Glucose, Bld: 111 mg/dL — ABNORMAL HIGH (ref 70–99)
Potassium: 3.8 mmol/L (ref 3.5–5.1)
Sodium: 126 mmol/L — ABNORMAL LOW (ref 135–145)

## 2019-08-03 LAB — RAPID URINE DRUG SCREEN, HOSP PERFORMED
Amphetamines: NOT DETECTED
Barbiturates: NOT DETECTED
Benzodiazepines: POSITIVE — AB
Cocaine: POSITIVE — AB
Opiates: NOT DETECTED
Tetrahydrocannabinol: NOT DETECTED

## 2019-08-03 LAB — CBC
HCT: 42.6 % (ref 39.0–52.0)
Hemoglobin: 14.6 g/dL (ref 13.0–17.0)
MCH: 28.6 pg (ref 26.0–34.0)
MCHC: 34.3 g/dL (ref 30.0–36.0)
MCV: 83.4 fL (ref 80.0–100.0)
Platelets: 279 10*3/uL (ref 150–400)
RBC: 5.11 MIL/uL (ref 4.22–5.81)
RDW: 13.3 % (ref 11.5–15.5)
WBC: 22.5 10*3/uL — ABNORMAL HIGH (ref 4.0–10.5)
nRBC: 0 % (ref 0.0–0.2)

## 2019-08-03 LAB — TYPE AND SCREEN
ABO/RH(D): A POS
Antibody Screen: NEGATIVE

## 2019-08-03 LAB — SARS CORONAVIRUS 2 BY RT PCR (HOSPITAL ORDER, PERFORMED IN ~~LOC~~ HOSPITAL LAB): SARS Coronavirus 2: NEGATIVE

## 2019-08-03 SURGERY — SIGMOIDOSCOPY, FLEXIBLE
Anesthesia: Moderate Sedation

## 2019-08-03 SURGERY — LAPAROTOMY, EXPLORATORY
Anesthesia: General

## 2019-08-03 MED ORDER — SODIUM CHLORIDE 0.9 % IV SOLN
1.0000 g | INTRAVENOUS | Status: AC
  Administered 2019-08-03: 1 g via INTRAVENOUS

## 2019-08-03 MED ORDER — HYDROMORPHONE HCL 1 MG/ML IJ SOLN
0.2500 mg | INTRAMUSCULAR | Status: DC | PRN
  Administered 2019-08-03 (×3): 0.25 mg via INTRAVENOUS
  Filled 2019-08-03 (×3): qty 0.5

## 2019-08-03 MED ORDER — SODIUM CHLORIDE 0.9 % IV SOLN
1.0000 g | INTRAVENOUS | Status: AC
  Administered 2019-08-04 – 2019-08-08 (×5): 1000 mg via INTRAVENOUS
  Filled 2019-08-03 (×6): qty 1

## 2019-08-03 MED ORDER — ONDANSETRON 4 MG PO TBDP
4.0000 mg | ORAL_TABLET | Freq: Four times a day (QID) | ORAL | Status: DC | PRN
  Administered 2019-08-10 – 2019-08-11 (×2): 4 mg via ORAL
  Filled 2019-08-03 (×2): qty 1

## 2019-08-03 MED ORDER — DEXAMETHASONE SODIUM PHOSPHATE 10 MG/ML IJ SOLN
INTRAMUSCULAR | Status: AC
  Filled 2019-08-03: qty 1

## 2019-08-03 MED ORDER — BUPIVACAINE LIPOSOME 1.3 % IJ SUSP
INTRAMUSCULAR | Status: DC | PRN
  Administered 2019-08-03: 20 mL

## 2019-08-03 MED ORDER — PROPOFOL 10 MG/ML IV BOLUS
INTRAVENOUS | Status: AC
  Filled 2019-08-03: qty 40

## 2019-08-03 MED ORDER — LACTATED RINGERS IV SOLN
Freq: Once | INTRAVENOUS | Status: AC
  Administered 2019-08-03: 16:00:00 via INTRAVENOUS

## 2019-08-03 MED ORDER — SODIUM CHLORIDE 0.9 % IR SOLN
Status: DC | PRN
  Administered 2019-08-03 (×5): 1000 mL

## 2019-08-03 MED ORDER — CHLORHEXIDINE GLUCONATE 4 % EX LIQD: 1.0000 "application " | Freq: Once | CUTANEOUS | Status: DC

## 2019-08-03 MED ORDER — HYDROCORTISONE NA SUCCINATE PF 100 MG IJ SOLR
INTRAMUSCULAR | Status: DC | PRN
  Administered 2019-08-03: 100 mg via INTRAVENOUS

## 2019-08-03 MED ORDER — ONDANSETRON HCL 4 MG/2ML IJ SOLN
INTRAMUSCULAR | Status: DC | PRN
  Administered 2019-08-03: 4 mg via INTRAVENOUS

## 2019-08-03 MED ORDER — ONDANSETRON HCL 4 MG/2ML IJ SOLN
INTRAMUSCULAR | Status: AC
  Filled 2019-08-03: qty 2

## 2019-08-03 MED ORDER — HYDROMORPHONE HCL 1 MG/ML IJ SOLN
1.0000 mg | Freq: Once | INTRAMUSCULAR | Status: AC
  Administered 2019-08-03: 21:00:00 1 mg via INTRAVENOUS

## 2019-08-03 MED ORDER — ROCURONIUM BROMIDE 100 MG/10ML IV SOLN
INTRAVENOUS | Status: DC | PRN
  Administered 2019-08-03: 30 mg via INTRAVENOUS
  Administered 2019-08-03: 20 mg via INTRAVENOUS
  Administered 2019-08-03: 50 mg via INTRAVENOUS

## 2019-08-03 MED ORDER — KETOROLAC TROMETHAMINE 30 MG/ML IJ SOLN
30.0000 mg | Freq: Four times a day (QID) | INTRAMUSCULAR | Status: AC
  Administered 2019-08-03 – 2019-08-08 (×20): 30 mg via INTRAVENOUS
  Filled 2019-08-03 (×21): qty 1

## 2019-08-03 MED ORDER — SODIUM CHLORIDE 0.9 % IV SOLN
INTRAVENOUS | Status: AC
  Filled 2019-08-03: qty 1

## 2019-08-03 MED ORDER — FENTANYL CITRATE (PF) 100 MCG/2ML IJ SOLN
INTRAMUSCULAR | Status: AC
  Filled 2019-08-03: qty 2

## 2019-08-03 MED ORDER — SODIUM CHLORIDE 0.9 % IV SOLN
INTRAVENOUS | Status: DC
  Administered 2019-08-03: 13:00:00 via INTRAVENOUS

## 2019-08-03 MED ORDER — LACTATED RINGERS IV SOLN
INTRAVENOUS | Status: DC
  Administered 2019-08-03 – 2019-08-08 (×9): via INTRAVENOUS

## 2019-08-03 MED ORDER — DIPHENHYDRAMINE HCL 50 MG/ML IJ SOLN: 12.5000 mg | Freq: Four times a day (QID) | INTRAMUSCULAR | Status: DC | PRN

## 2019-08-03 MED ORDER — DIPHENHYDRAMINE HCL 12.5 MG/5ML PO ELIX: 12.5000 mg | ORAL_SOLUTION | Freq: Four times a day (QID) | ORAL | Status: DC | PRN

## 2019-08-03 MED ORDER — LACTATED RINGERS IV SOLN
INTRAVENOUS | Status: DC | PRN
  Administered 2019-08-03: 17:00:00 via INTRAVENOUS

## 2019-08-03 MED ORDER — HYDROCORTISONE NA SUCCINATE PF 100 MG IJ SOLR
INTRAMUSCULAR | Status: AC
  Filled 2019-08-03: qty 2

## 2019-08-03 MED ORDER — HYDROMORPHONE HCL 1 MG/ML IJ SOLN
1.0000 mg | INTRAMUSCULAR | Status: DC | PRN
  Administered 2019-08-04 – 2019-08-09 (×10): 1 mg via INTRAVENOUS
  Filled 2019-08-03 (×11): qty 1

## 2019-08-03 MED ORDER — HEPARIN SODIUM (PORCINE) 5000 UNIT/ML IJ SOLN
5000.0000 [IU] | Freq: Three times a day (TID) | INTRAMUSCULAR | Status: DC
  Administered 2019-08-03 – 2019-08-06 (×9): 5000 [IU] via SUBCUTANEOUS
  Filled 2019-08-03 (×9): qty 1

## 2019-08-03 MED ORDER — SUCCINYLCHOLINE CHLORIDE 20 MG/ML IJ SOLN
INTRAMUSCULAR | Status: DC | PRN
  Administered 2019-08-03: 170 mg via INTRAVENOUS

## 2019-08-03 MED ORDER — DEXAMETHASONE SODIUM PHOSPHATE 10 MG/ML IJ SOLN
INTRAMUSCULAR | Status: DC | PRN
  Administered 2019-08-03: 10 mg via INTRAVENOUS

## 2019-08-03 MED ORDER — MIDAZOLAM HCL 5 MG/5ML IJ SOLN
INTRAMUSCULAR | Status: DC | PRN
  Administered 2019-08-03 (×2): 1 mg via INTRAVENOUS
  Administered 2019-08-03 (×2): 2 mg via INTRAVENOUS

## 2019-08-03 MED ORDER — METOPROLOL TARTRATE 5 MG/5ML IV SOLN
INTRAVENOUS | Status: AC
  Filled 2019-08-03: qty 5

## 2019-08-03 MED ORDER — PROPOFOL 10 MG/ML IV BOLUS
INTRAVENOUS | Status: DC | PRN
  Administered 2019-08-03: 200 mg via INTRAVENOUS

## 2019-08-03 MED ORDER — LORAZEPAM 2 MG/ML IJ SOLN
2.0000 mg | INTRAMUSCULAR | Status: DC | PRN
  Administered 2019-08-04 – 2019-08-09 (×5): 2 mg via INTRAVENOUS
  Filled 2019-08-03 (×6): qty 1

## 2019-08-03 MED ORDER — FENTANYL CITRATE (PF) 250 MCG/5ML IJ SOLN
INTRAMUSCULAR | Status: AC
  Filled 2019-08-03: qty 10

## 2019-08-03 MED ORDER — LIDOCAINE HCL 1 % IJ SOLN
INTRAMUSCULAR | Status: DC | PRN
  Administered 2019-08-03: 70 mg via INTRADERMAL

## 2019-08-03 MED ORDER — NICOTINE 21 MG/24HR TD PT24
21.0000 mg | MEDICATED_PATCH | Freq: Every day | TRANSDERMAL | Status: DC
  Administered 2019-08-04 – 2019-08-11 (×8): 21 mg via TRANSDERMAL
  Filled 2019-08-03 (×8): qty 1

## 2019-08-03 MED ORDER — BUPIVACAINE LIPOSOME 1.3 % IJ SUSP
INTRAMUSCULAR | Status: AC
  Filled 2019-08-03: qty 20

## 2019-08-03 MED ORDER — MIDAZOLAM HCL 5 MG/5ML IJ SOLN
INTRAMUSCULAR | Status: AC
  Filled 2019-08-03: qty 10

## 2019-08-03 MED ORDER — MEPERIDINE HCL 50 MG/ML IJ SOLN
INTRAMUSCULAR | Status: AC
  Filled 2019-08-03: qty 1

## 2019-08-03 MED ORDER — KETOROLAC TROMETHAMINE 30 MG/ML IJ SOLN
30.0000 mg | Freq: Four times a day (QID) | INTRAMUSCULAR | Status: DC | PRN
  Administered 2019-08-10 – 2019-08-11 (×2): 30 mg via INTRAVENOUS
  Filled 2019-08-03 (×3): qty 1

## 2019-08-03 MED ORDER — METOPROLOL TARTRATE 5 MG/5ML IV SOLN: 5.0000 mg | Freq: Four times a day (QID) | INTRAVENOUS | Status: DC | PRN

## 2019-08-03 MED ORDER — SUGAMMADEX SODIUM 500 MG/5ML IV SOLN
INTRAVENOUS | Status: DC | PRN
  Administered 2019-08-03: 200 mg via INTRAVENOUS

## 2019-08-03 MED ORDER — SODIUM CHLORIDE 0.9 % IV SOLN: INTRAVENOUS | Status: DC

## 2019-08-03 MED ORDER — ONDANSETRON HCL 4 MG/2ML IJ SOLN
4.0000 mg | Freq: Four times a day (QID) | INTRAMUSCULAR | Status: DC | PRN
  Administered 2019-08-04 – 2019-08-10 (×4): 4 mg via INTRAVENOUS
  Filled 2019-08-03 (×5): qty 2

## 2019-08-03 MED ORDER — FENTANYL CITRATE (PF) 100 MCG/2ML IJ SOLN
INTRAMUSCULAR | Status: DC | PRN
  Administered 2019-08-03: 100 ug via INTRAVENOUS
  Administered 2019-08-03: 150 ug via INTRAVENOUS

## 2019-08-03 MED ORDER — LACTATED RINGERS IV SOLN
INTRAVENOUS | Status: DC | PRN
  Administered 2019-08-03 (×3): via INTRAVENOUS

## 2019-08-03 MED ORDER — METHOCARBAMOL 500 MG PO TABS
500.0000 mg | ORAL_TABLET | Freq: Four times a day (QID) | ORAL | Status: DC | PRN
  Administered 2019-08-07: 15:00:00 500 mg via ORAL
  Filled 2019-08-03: qty 1

## 2019-08-03 MED ORDER — HYDROMORPHONE HCL 1 MG/ML IJ SOLN
INTRAMUSCULAR | Status: AC
  Filled 2019-08-03: qty 4

## 2019-08-03 MED ORDER — STERILE WATER FOR IRRIGATION IR SOLN
Status: DC | PRN
  Administered 2019-08-03: 1.5 mL

## 2019-08-03 MED ORDER — ONDANSETRON HCL 4 MG/2ML IJ SOLN
4.0000 mg | Freq: Once | INTRAMUSCULAR | Status: AC | PRN
  Administered 2019-08-03: 4 mg via INTRAVENOUS

## 2019-08-03 MED ORDER — HYDROMORPHONE HCL 1 MG/ML IJ SOLN
INTRAMUSCULAR | Status: DC | PRN
  Administered 2019-08-03 (×4): 1 mg via INTRAVENOUS

## 2019-08-03 MED ORDER — PANTOPRAZOLE SODIUM 40 MG IV SOLR
40.0000 mg | Freq: Every day | INTRAVENOUS | Status: DC
  Administered 2019-08-03 – 2019-08-10 (×8): 40 mg via INTRAVENOUS
  Filled 2019-08-03 (×8): qty 40

## 2019-08-03 MED ORDER — FENTANYL CITRATE (PF) 100 MCG/2ML IJ SOLN
INTRAMUSCULAR | Status: DC | PRN
  Administered 2019-08-03 (×2): 25 ug via INTRAVENOUS

## 2019-08-03 SURGICAL SUPPLY — 68 items
APL PRP STRL LF DISP 70% ISPRP (MISCELLANEOUS) ×2
APL SWBSTK 6 STRL LF DISP (MISCELLANEOUS) ×4
APPLICATOR COTTON TIP 6 STRL (MISCELLANEOUS) IMPLANT
APPLICATOR COTTON TIP 6IN STRL (MISCELLANEOUS) ×6
APPLIER CLIP 13 LRG OPEN (CLIP) ×3
APR CLP LRG 13 20 CLIP (CLIP) ×2
BARRIER SKIN 2 3/4 (OSTOMY) ×3 IMPLANT
BARRIER SKIN OD2.25 2 3/4 FLNG (OSTOMY) IMPLANT
BRR SKN FLT 2.75X2.25 2 PC (OSTOMY) ×2
CHLORAPREP W/TINT 26 (MISCELLANEOUS) ×3 IMPLANT
CLIP APPLIE 13 LRG OPEN (CLIP) IMPLANT
CLOTH BEACON ORANGE TIMEOUT ST (SAFETY) ×3 IMPLANT
CONNECTOR 5 IN 1 STRAIGHT STRL (MISCELLANEOUS) ×1 IMPLANT
COVER LIGHT HANDLE STERIS (MISCELLANEOUS) ×6 IMPLANT
COVER WAND RF STERILE (DRAPES) ×1 IMPLANT
DRAPE WARM FLUID 44X44 (DRAPES) ×3 IMPLANT
DRSG OPSITE POSTOP 4X10 (GAUZE/BANDAGES/DRESSINGS) ×1 IMPLANT
DURAPREP 26ML APPLICATOR (WOUND CARE) IMPLANT
ELECT REM PT RETURN 9FT ADLT (ELECTROSURGICAL) ×3
ELECTRODE REM PT RTRN 9FT ADLT (ELECTROSURGICAL) ×2 IMPLANT
EVACUATOR DRAINAGE 10X20 100CC (DRAIN) IMPLANT
EVACUATOR SILICONE 100CC (DRAIN) ×3
GAUZE SPONGE 4X4 16PLY XRAY LF (GAUZE/BANDAGES/DRESSINGS) ×1 IMPLANT
GLOVE BIO SURGEON STRL SZ 6.5 (GLOVE) ×3 IMPLANT
GLOVE BIO SURGEON STRL SZ7 (GLOVE) ×2 IMPLANT
GLOVE BIOGEL PI IND STRL 6.5 (GLOVE) ×2 IMPLANT
GLOVE BIOGEL PI IND STRL 7.0 (GLOVE) ×4 IMPLANT
GLOVE BIOGEL PI INDICATOR 6.5 (GLOVE) ×1
GLOVE BIOGEL PI INDICATOR 7.0 (GLOVE) ×3
GLOVE SURG SS PI 7.5 STRL IVOR (GLOVE) ×3 IMPLANT
GOWN STRL REUS W/TWL LRG LVL3 (GOWN DISPOSABLE) ×10 IMPLANT
HANDLE SUCTION POOLE (INSTRUMENTS) ×2 IMPLANT
INST SET MAJOR GENERAL (KITS) ×3 IMPLANT
KIT TURNOVER KIT A (KITS) ×3 IMPLANT
LIGASURE IMPACT 36 18CM CVD LR (INSTRUMENTS) ×1 IMPLANT
MANIFOLD NEPTUNE II (INSTRUMENTS) ×3 IMPLANT
NDL HYPO 18GX1.5 BLUNT FILL (NEEDLE) ×2 IMPLANT
NDL HYPO 21X1.5 SAFETY (NEEDLE) ×2 IMPLANT
NEEDLE HYPO 18GX1.5 BLUNT FILL (NEEDLE) ×3 IMPLANT
NEEDLE HYPO 21X1.5 SAFETY (NEEDLE) ×3 IMPLANT
NS IRRIG 1000ML POUR BTL (IV SOLUTION) ×7 IMPLANT
PACK ABDOMINAL MAJOR (CUSTOM PROCEDURE TRAY) ×3 IMPLANT
PAD ARMBOARD 7.5X6 YLW CONV (MISCELLANEOUS) ×3 IMPLANT
RELOAD PROXIMATE 75MM BLUE (ENDOMECHANICALS) ×3 IMPLANT
RELOAD STAPLE 75 3.8 BLU REG (ENDOMECHANICALS) IMPLANT
RETRACTOR WND ALEXIS 25 LRG (MISCELLANEOUS) IMPLANT
RTRCTR WOUND ALEXIS 25CM LRG (MISCELLANEOUS) ×3
SET BASIN LINEN APH (SET/KITS/TRAYS/PACK) ×3 IMPLANT
SPONGE DRAIN TRACH 4X4 STRL 2S (GAUZE/BANDAGES/DRESSINGS) ×1 IMPLANT
SPONGE LAP 18X18 RF (DISPOSABLE) ×6 IMPLANT
STAPLER CUT CVD 40MM GREEN (STAPLE) ×1 IMPLANT
STAPLER PROXIMATE 75MM BLUE (STAPLE) ×1 IMPLANT
STAPLER VISISTAT (STAPLE) ×3 IMPLANT
SUCTION POOLE HANDLE (INSTRUMENTS) ×3
SUT CHROMIC 2 0 SH (SUTURE) ×1 IMPLANT
SUT ETHILON 3 0 FSL (SUTURE) ×1 IMPLANT
SUT PDS AB CT VIOLET #0 27IN (SUTURE) ×2 IMPLANT
SUT PROLENE 2 0 SH 30 (SUTURE) ×1 IMPLANT
SUT SILK 3 0 SH CR/8 (SUTURE) ×1 IMPLANT
SUT VIC AB 3-0 SH 27 (SUTURE) ×9
SUT VIC AB 3-0 SH 27X BRD (SUTURE) IMPLANT
SYR 20CC LL (SYRINGE) ×6 IMPLANT
SYR 30ML LL (SYRINGE) ×1 IMPLANT
TAPE CLOTH SURG 4X10 WHT LF (GAUZE/BANDAGES/DRESSINGS) ×1 IMPLANT
TRAY FOLEY W/BAG SLVR 16FR (SET/KITS/TRAYS/PACK) ×3
TRAY FOLEY W/BAG SLVR 16FR ST (SET/KITS/TRAYS/PACK) ×2 IMPLANT
TUBE MOSS GAS 18FR (TUBING) ×1 IMPLANT
WATER STERILE IRR 500ML POUR (IV SOLUTION) ×1 IMPLANT

## 2019-08-03 NOTE — H&P (Signed)
 @LOGO @   Primary Care Physician:  Shirlean MylarWebb, Carol, MD Primary Gastroenterologist:  Dr. Jena Gaussourk  Pre-Procedure History & Physical: HPI:  [name] [name] is a 53 y.o. male here for urgent encounter to remove foreign body from rectum rectum and rectosigmoid.  Inserted 2 days ago.  Would not come out.  Seen in the ED by EDP.  Foreign body confirmed on plain films for no perforation mild upstream bowel dilation.  Patient desires removal.  Past Medical History:  Diagnosis Date  . Anxiety   . Chronic fatigue   . Chronic pain   . Fibromyalgia     Past Surgical History:  Procedure Laterality Date  . KNEE ARTHROSCOPY Left 2006   miniscus tear  . LYMPH GLAND EXCISION Left 1983  . TONSILLECTOMY      Prior to Admission medications   Medication Sig Start Date End Date Taking? Authorizing Provider  amitriptyline (ELAVIL) 50 MG tablet Take 150-200 mg by mouth at bedtime.   Yes [provider]  ARIPiprazole (ABILIFY) 5 MG tablet Take 5 mg by mouth at bedtime.   Yes [provider]  clonazePAM (KLONOPIN) 2 MG tablet Take 2 mg by mouth 3 (three) times daily as needed for anxiety.   Yes [provider]  DULoxetine (CYMBALTA) 60 MG capsule Take 1 capsule by mouth 2 (two) times daily. 07/30/19  Yes [provider]  gabapentin (NEURONTIN) 400 MG capsule Take 800 mg by mouth 2 (two) times daily. And an additional 800 mg as needed. 10/30/14  Yes [provider]  pravastatin (PRAVACHOL) 10 MG tablet Take 10 mg by mouth daily.   Yes [provider]  traMADol (ULTRAM) 50 MG tablet Take 100 mg by mouth every 6 (six) hours as needed.   Yes [provider]  zolpidem (AMBIEN) 10 MG tablet Take 10 mg by mouth at bedtime. 10/30/14  Yes [provider]  HYDROcodone-acetaminophen (NORCO) 7.5-325 MG tablet Take 1 tablet by mouth every 6 (six) hours as needed for moderate pain.     [provider]    Allergies as of 08/03/2019 - Review Complete  08/03/2019  Allergen Reaction Noted  . Demerol [meperidine]  05/30/2017  . Levaquin [levofloxacin in d5w]  05/30/2017  . Morphine and related  05/30/2017  . Other  07/08/2011  . Penicillins  04/26/2007    Family History  Problem Relation Age of Onset  . Diabetes Mother   . Heart attack Father   . Diabetes Maternal Grandmother     Social History   Socioeconomic History  . Marital status: Legally Separated    Spouse name: Not on file  . Number of children: Not on file  . Years of education: Not on file  . Highest education level: Not on file  Occupational History  . Not on file  Social Needs  . Financial resource strain: Not on file  . Food insecurity    Worry: Not on file    Inability: Not on file  . Transportation needs    Medical: Not on file    Non-medical: Not on file  Tobacco Use  . Smoking status: Current Every Day Smoker    Packs/day: 0.50    Years: 10.00    Pack years: 5.00  . Smokeless tobacco: Never Used  Substance and Sexual Activity  . Alcohol use: Yes    Comment: occasional  . Drug use: Yes    Types: Cocaine  . Sexual activity: Not on file  Lifestyle  . Physical activity  Days per week: Not on file    Minutes per session: Not on file  . Stress: Not on file  Relationships  . Social Herbalist on phone: Not on file    Gets together: Not on file    Attends religious service: Not on file    Active member of club or organization: Not on file    Attends meetings of clubs or organizations: Not on file    Relationship status: Not on file  . Intimate partner violence    Fear of current or ex partner: Not on file    Emotionally abused: Not on file    Physically abused: Not on file    Forced sexual activity: Not on file  Other Topics Concern  . Not on file  Social History Narrative  . Not on file    Review of Systems: See HPI, otherwise negative ROS  Physical Exam: BP (!) 155/96   Pulse (!) 119   Temp 97.6 F (36.4 C) (Oral)    Resp (!) 21   SpO2 97%  General:   Alert, anxious uncomfortable appearing.  pleasant and cooperative in NAD Lungs:  Clear throughout to auscultation.   No wheezes, crackles, or rhonchi. No acute distress. Heart:  Regular rate and rhythm; no murmurs, clicks, rubs,  or gallops. Abdomen: Non-distended, normal bowel sounds.  Soft and nontender without appreciable mass or hepatosplenomegaly.  Pulses:  Normal pulses noted. Extremities:  Without clubbing or edema.  Impression/Plan: 53 year old gentleman foreign body in rectum/rectosigmoid.  Attempted endoscopic extraction offered.  Potential risk benefits imitations alternatives have been reviewed.  I told the patient I will make my best effort to remove endoscopically.  If I could not remove it then he would likely need an operation, consulting with general surgery.  His questions were answered.  He seems to understand.  Further recommendations to follow.     Notice: This dictation was prepared with Dragon dictation along with smaller phrase technology. Any transcriptional errors that result from this process are unintentional and may not be corrected upon review.

## 2019-08-03 NOTE — Op Note (Signed)
[facility] Operative Note  08/03/19  Preoperative Diagnosis:  Foreign body in rectum   Postoperative Diagnosis: Fecal Peritonitis, Perforation of Rectum from foreign body, Nasal obstruction, Ileus    Procedure(s) Performed: Low anterior resection of sigmoid colon and rectum, end colostomy, gastrostomy tube placement, JP drain placement, removal of foreign body   Surgeon: Leatrice JewelsLindsay C. [name], MD   Assistants: No qualified resident was available    Anesthesia: General endotracheal   Anesthesiologist: Molli BarrowsBattula, Rajamani C, MD    Specimens:  Sigmoid and rectum    Estimated Blood Loss: 200cc   Blood Replacement: None    Complications: None    Wound Class:Dirty/ infected (fecal peritonitis)    Operative Indications: Mr. Luciana AxeComer is a 53 yo with a history of a foreign body in his rectum/ sigmoid for several days prior to coming to the ED. He has a history of chronic pain and substance abuse, and says this was inserted about 2 days prior. Dr. Jena Gaussourk attempted endoscopic retrieval but this was unsuccessful. The patient had a Xray that had dilated bowel and had no BMs, so he was clinically obstructed. He was also quite tender when I examined him. Given this and the foreign body he had a high likelihood of having a perforation and was taken back surgery emergently. He was cocaine positive. We discussed the risk of bleeding, infection, risk of perforation, risk of injury to another organ, risk of needing a colostomy, and the patient agreed. He did not want anyone to know of the reason behind his colon surgery.    Findings: Fecal peritonitis, perforation of the right and left lateral side wall of the distal rectum, just above the peritoneal reflexion, large 8 inch metallic vibrator removed after finding it floating free in the pelvis    Procedure: The patient was taken to the operating room and placed supine. General endotracheal anesthesia was induced. Intravenous antibiotics were   administered per protocol.  An orogastric tube positioned to decompress the stomach. The abdomen was prepared and draped in the usual sterile fashion.   A midline incision was and carried down through the subcutaneous tissue to the fascia with electrocautery. Care was taken on entering the abdomen. On entry there was murky fluid and a foul smell.  This was suctioned. There was evidence of fecal peritonitis. The small bowel was very dilated and distended. There was inflammatory rind throughout the pelvis and lower abdomen.  I felt into the pelvis and found a floating metallic 8 inch vibrator.   The patient was placed in Trendelenburg and a I attempted to place the small bowel in the right upper quadrant with a laparotomy pad the best that I could but due to the dilation this was difficult. I was able to mobilize the white line of Toldt laterally on the descending colon, and work down freeing up inflammatory adhesions to the left pelvic wall.  Care was taken and the left ureter was identified and protected. I could feel into the pelvis and deep just a the reflection there were two large perforations of 1-2 inches on each lateral anterior aspect of the distal rectum.  I continued to dissect out the colon laterally into the pelvis with electrocautery.  After getting things freed up, I found a proximal point on the colon that was healthy, and transected this with a linear cutting stapler standard load.  I used the Ligasure to ligate the mesentery down into the pelvis. I ensured the left ureter was protected.  The distal  aspect was very low, I was able to get around a portion of one of the perforations but not the entire perforations. I used a TA contour green load to transect the distal rectum.  Again not the entire perforation was closed. I attempted to close this with 2-0 silk interrupted sutures.   Copious irrigation was performed.  After this was performed. The small bowel was dilated and needed to be  decompressed. Anesthesia had tried for a long time to get an NG or oral gastric tube, and this was unsuccessful.  I attempted placement of the NG and the right nares was completely obstructed. The left nares would pass the NG about 10 cm and then there was resistance. We attempted to use the Glidescope bronchoscope and I looked into the left nares and could not find a passage down, there was trauma.  We could get the bronchoscope into he esophagus but could not pass anything. We attempted taking down the ETT balloon to see if this helped and it did not. We tried a 12-18 french NG tubes unsuccessfully. This was attempted for over 45 minutes.  After this, I called an spoke with Dr. Laural Golden, and discussed the option of of endoscopic placement of the NG but given that the nares was obstructed. I do not think this will be successful.  I opted to perform an open gastrostomy tube to decompress the stomach.   I extended the midline superior, and identified the stomach. A Moss gastrostomy tube was pulled through the left upper quadrant after a stab incision.  A pursestring was made in the anterior stomach, and the gastrotomy was made. The tube was placed into the stomach, and the pursestring was secured. This was done with a 3-0 Vicryl suture.  The gastrotomy balloon was filled with 20 cc of water. The stomach was stammed to the anterior abdominal wall with multiple 3-0 Vicryl suture. After this was done, the bumper of the gastrostomy tube was secured with a 2-0 prolene.     Attention was then turned to the colostomy. The descending colon was mobilized further toward the splenic flexure. The mesentery was scored to release and allow for the colon to reach past the anterior abdominal wall.  A circular incision was made left of the umbilicus and carried down into the subcutaneous tissue a cruciate incision was made.  The end of the colon was brought out through the abdominal wall and secured with a babcock. Nothing was  twisted or under tension.    A JP was brought out through the right lower quadrant and was tucked into the pelvis over the rectal stump given the limitations in closing the rectal stump.    The midline was closed with 0 PDS sutures and staples. The patients umbilical hernia was closed in the closure.  The honeycomb dressing was applied and protected to prevent contamination.  The colostomy was matured with 2-0 chromic gut suture. It was digitalized past the fascia easily. The colostomy appliance was placed.    Final inspection revealed acceptable hemostasis. All counts were correct at the end of the case. The patient was awakened from anesthesia and extubated without complication.  The patient went to the PACU in stable condition.   Curlene Labrum, MD [facility] [address] [place], [place] [address] [phone] (office)

## 2019-08-03 NOTE — Addendum Note (Signed)
Addendum  created 08/03/19 2139 by Denese Killings, MD   Intraprocedure Flowsheets edited

## 2019-08-03 NOTE — Transfer of Care (Signed)
Immediate Anesthesia Transfer of Care Note  Patient: [name] [name]  Procedure(s) Performed: EXPLORATORY LAPAROTOMY (N/A ) COLOSTOMY (N/A ) COLON RESECTION SIGMOID GASTROSTOMY TUBE (N/A )  Patient Location: PACU  Anesthesia Type:General  Level of Consciousness: awake  Airway & Oxygen Therapy: Patient Spontanous Breathing and non-rebreather face mask  Post-op Assessment: Report given to RN  Post vital signs: stable  Last Vitals:  Vitals Value Taken Time  BP 173/95 08/03/19 2100  Temp    Pulse 126 08/03/19 2110  Resp 14 08/03/19 2110  SpO2 99 % 08/03/19 2110  Vitals shown include unvalidated device data.  Last Pain:  Vitals:   08/03/19 1429  TempSrc: Oral  PainSc: 8       Patients Stated Pain Goal: 5 (93/71/69 6789)  Complications: No apparent anesthesia complications

## 2019-08-03 NOTE — Progress Notes (Signed)
[facility]  Invanz ordered for preop. Less cross reactivity than cephalosporins for PCN allergy.   Curlene Labrum, MD [facility] [address] [place] [address] [phone] (office)

## 2019-08-03 NOTE — Anesthesia Postprocedure Evaluation (Signed)
Anesthesia Post Note  Patient: [name] [name]  Procedure(s) Performed: EXPLORATORY LAPAROTOMY (N/A ) COLOSTOMY (N/A ) COLON RESECTION SIGMOID GASTROSTOMY TUBE (N/A )  Patient location during evaluation: PACU Anesthesia Type: General Level of consciousness: awake and oriented Pain management: pain level controlled Vital Signs Assessment: post-procedure vital signs reviewed and stable Respiratory status: spontaneous breathing, respiratory function stable, patient connected to face mask oxygen and non-rebreather facemask Cardiovascular status: blood pressure returned to baseline and tachycardic (metoprolol 3 mg iv was given, pulse 106) Postop Assessment: no apparent nausea or vomiting Anesthetic complications: no     Last Vitals:  Vitals:   08/03/19 2057 08/03/19 2100  BP: (!) 167/96 (!) 173/95  Pulse: (!) 130 (!) 124  Resp: 15 14  Temp: 36.7 C   SpO2: 92% 97%    Last Pain:  Vitals:   08/03/19 1429  TempSrc: Oral  PainSc: 8                  Danilynn Jemison C Jaquita Bessire
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
## 2019-08-03 NOTE — Op Note (Signed)
[facility] Patient Name: [name] [name] Procedure Date: 08/03/2019 2:36 PM MRN: [id number] Date of Birth: 11/05/1966 Attending MD: [name] [name] , MD CSN: [id number] Age: [age] Admit Type: Outpatient Procedure:                Flexible Sigmoidoscopy Indications:              Lower abdominal pain, Foreign body in colon Providers:                [name] Pacobert Michael Yosef Krogh, MD, [name] Bigiffani Roberts, RN,                            [name] [name] Referring MD:              Medicines:                Fentanyl 50 micrograms IV, Midazolam 6 mg IV Complications:            No immediate complications. Estimated Blood Loss:     Estimated blood loss: none. Procedure:                Pre-Anesthesia Assessment:                           - Prior to the procedure, a History and Physical                            was performed, and patient medications and                            allergies were reviewed. The patient's tolerance of                            previous anesthesia was also reviewed. The risks                            and benefits of the procedure and the sedation                            options and risks were discussed with the patient.                            All questions were answered, and informed consent                            was obtained. Prior Anticoagulants: The patient has                            taken no previous anticoagulant or antiplatelet                            agents. ASA Grade Assessment: III - A patient with                            severe systemic disease. After reviewing the risks  and benefits, the patient was deemed in                            satisfactory condition to undergo the procedure.                           After obtaining informed consent, the scope was                            passed under direct vision. The CF-[id number] ([id number]                            scope was introduced through the anus and advanced                     to the the sigmoid colon. The flexible                            sigmoidoscopy was performed with difficulty due to                            poor endoscopic visualization. Scope In: 3:07:45 PM Scope Out: 3:16:02 PM Total Procedure Duration: 0 hours 8 minutes 17 seconds  Findings:      The perianal and digital rectal examinations were normal. Formed and       semi-formed stool in the rectal vault. Stool trailed up into the       sigmoid; I was able to advance the scope to 30 cm. Patient was       uncomfortable before the procedure started and continued to be       uncomfortable during attempts to reach foreign body. Due to angulation       in the sigmoid at the proximal extent reached, significant pain, plain       film findings and elevated white count, I elected to terminate the       procedure. Impression:               -Exam carried out to 30 cm. No foreign body seen.                            Procedure aborted as described above. Concerned                            about patient's degree of pain, partial bowel                            obstruction and leukocytosis. This foreign body is                            not extractable endoscopically. I called Dr.                            Henreitta [name] for surgery consultation to remove it                            surgically. I had this discussion  with the patient                            as well. He is agreeable. Moderate Sedation:      Moderate (conscious) sedation was administered by the endoscopy nurse       and supervised by the endoscopist. The following parameters were       monitored: oxygen saturation, heart rate, blood pressure, respiratory       rate, EKG, adequacy of pulmonary ventilation, and response to care.       Total physician intraservice time was 15 minutes. Recommendation:           Surgery consultation. Anticipate admission and                            urgent surgical intervention. Procedure  Code(s):        --- Professional ---                           [phone], Sigmoidoscopy, flexible; diagnostic,                            including collection of specimen(s) by brushing or                            washing, when performed (separate procedure)                           G0500, Moderate sedation services provided by the                            same physician or other qualified health care                            professional performing a gastrointestinal                            endoscopic service that sedation supports,                            requiring the presence of an independent trained                            observer to assist in the monitoring of the                            patient's level of consciousness and physiological                            status; initial 15 minutes of intra-service time;                            patient age 72 years or older (additional time may                            be reported with [phone], as appropriate) Diagnosis  Code(s):        --- Professional ---                           R10.30, Lower abdominal pain, unspecified CPT copyright 2019 [employer]. All rights reserved. The codes documented in this report are preliminary and upon coder review may  be revised to meet current compliance requirements. [name] Friendsobert M. [name], MD [name] Pacobert Michael Yerania Chamorro, MD 08/03/2019 3:38:00 PM This report has been signed electronically. Number of Addenda: 0

## 2019-08-03 NOTE — OR Nursing (Signed)
Patient transported to short stay for an exploratory lap. Type and screen done per MD order. Patient alert and verbalizes understanding.

## 2019-08-03 NOTE — H&P (Addendum)
[facility] History and Physical  Reason for Referral: Foreign body in rectum/ sigmoid colon, unable to get on colonoscopy, large bowel obstruction  Referring Physician:  Dr. Jena Gaussourk   Chief Complaint    Foreign Body in Rectum      [name] [name] is a 53 y.o. male.  HPI:  [name] [name] is a 53 yo with chronic pain, substance abuse who has told people he did cocaine a few days ago who presented to the ED with abdominal pain, nausea/vomiting, no BMs after inserting a vibrator in his rectum 2 days prior. He says that he has not had a BM and that he has been not able to eat or drink much since that time.  He has never had abdominal surgery. He reports that he is on chronic pain medication and follows with a PCP at [facility] on [address].   Dr. Jena Gaussourk attempted endoscopic retrieval but had difficulty due to stool burden and the patient's pain.  Discussed the case with Dr. Jena Gaussourk. He has never had a colonoscopy.   Past Medical History:  Diagnosis Date  . Anxiety   . Chronic fatigue   . Chronic pain   . Fibromyalgia     Past Surgical History:  Procedure Laterality Date  . KNEE ARTHROSCOPY Left 2006   miniscus tear  . LYMPH GLAND EXCISION Left 1983  . TONSILLECTOMY      Family History  Problem Relation Age of Onset  . Diabetes Mother   . Heart attack Father   . Diabetes Maternal Grandmother     Social History   Tobacco Use  . Smoking status: Current Every Day Smoker    Packs/day: 0.50    Years: 10.00    Pack years: 5.00  . Smokeless tobacco: Never Used  Substance Use Topics  . Alcohol use: Yes    Comment: occasional  . Drug use: Yes    Types: Cocaine    Medications: I have reviewed the patient's current medications. Medications Prior to Admission  Medication Sig Dispense Refill  . amitriptyline (ELAVIL) 50 MG tablet Take 150-200 mg by mouth at bedtime.    . ARIPiprazole (ABILIFY) 5 MG tablet Take 5 mg by mouth at bedtime.    . clonazePAM (KLONOPIN) 2 MG  tablet Take 2 mg by mouth 3 (three) times daily as needed for anxiety.    . DULoxetine (CYMBALTA) 60 MG capsule Take 1 capsule by mouth 2 (two) times daily.    Marland Kitchen. gabapentin (NEURONTIN) 400 MG capsule Take 800 mg by mouth 2 (two) times daily. And an additional 800 mg as needed.    . pravastatin (PRAVACHOL) 10 MG tablet Take 10 mg by mouth daily.    . traMADol (ULTRAM) 50 MG tablet Take 100 mg by mouth every 6 (six) hours as needed.    . zolpidem (AMBIEN) 10 MG tablet Take 10 mg by mouth at bedtime.    Marland Kitchen. HYDROcodone-acetaminophen (NORCO) 7.5-325 MG tablet Take 1 tablet by mouth every 6 (six) hours as needed for moderate pain.      Allergies  Allergen Reactions  . Demerol [Meperidine]   . Levaquin [Levofloxacin In D5w]   . Morphine And Related   . Other     arythromycin  . Penicillins     REACTION: Rash and facial swelling at age 53     ROS:  A comprehensive review of systems was negative except for: Gastrointestinal: positive for abdominal pain, nausea, vomiting and foreign body in rectum  Blood pressure Marland Kitchen(!)  158/98, pulse 95, temperature 97.6 F (36.4 C), temperature source Oral, resp. rate (!) 32, SpO2 95 %. Physical Exam Vitals signs reviewed.  Constitutional:      Appearance: Normal appearance.  HENT:     Head: Normocephalic and atraumatic.     Nose: Nose normal.     Mouth/Throat:     Mouth: Mucous membranes are dry.  Eyes:     Pupils: Pupils are equal, round, and reactive to light.  Neck:     Musculoskeletal: Normal range of motion.  Cardiovascular:     Rate and Rhythm: Normal rate and regular rhythm.  Pulmonary:     Effort: Pulmonary effort is normal.     Breath sounds: Normal breath sounds.  Abdominal:     General: There is distension.     Palpations: Abdomen is soft.     Tenderness: There is abdominal tenderness. There is guarding. There is no rebound.     Comments: Umbilical hernia, nonreducible, skin slightly thinned/ red over the umbilicus, tender in the lower  abdomen, distended/ tympanic  Musculoskeletal: Normal range of motion.        General: No swelling.  Skin:    General: Skin is warm and dry.  Neurological:     Mental Status: He is alert and oriented to person, place, and time.  Psychiatric:        Mood and Affect: Mood normal.        Behavior: Behavior normal.        Thought Content: Thought content normal.        Judgment: Judgment normal.     Results: Results for orders placed or performed during the hospital encounter of 08/03/19 (from the past 48 hour(s))  CBC     Status: Abnormal   Collection Time: 08/03/19 12:34 PM  Result Value Ref Range   WBC 22.5 (H) 4.0 - 10.5 K/uL   RBC 5.11 4.22 - 5.81 MIL/uL   Hemoglobin 14.6 13.0 - 17.0 g/dL   HCT 42.6 39.0 - 52.0 %   MCV 83.4 80.0 - 100.0 fL   MCH 28.6 26.0 - 34.0 pg   MCHC 34.3 30.0 - 36.0 g/dL   RDW 13.3 11.5 - 15.5 %   Platelets 279 150 - 400 K/uL   nRBC 0.0 0.0 - 0.2 %    Comment: Performed at [facility], [address], [place], [place] [address]  Basic metabolic panel     Status: Abnormal   Collection Time: 08/03/19 12:34 PM  Result Value Ref Range   Sodium 126 (L) 135 - 145 mmol/L   Potassium 3.8 3.5 - 5.1 mmol/L   Chloride 92 (L) 98 - 111 mmol/L   CO2 24 22 - 32 mmol/L   Glucose, Bld 111 (H) 70 - 99 mg/dL   BUN 29 (H) 6 - 20 mg/dL   Creatinine, Ser 1.10 0.61 - 1.24 mg/dL   Calcium 8.3 (L) 8.9 - 10.3 mg/dL   GFR calc non Af Amer >60 >60 mL/min   GFR calc Af Amer >60 >60 mL/min   Anion gap 10 5 - 15    Comment: Performed at [facility], [address], [place], [place] [address]  SARS Coronavirus 2 [facility] order, Performed in [facility] hospital lab) Nasopharyngeal Nasopharyngeal Swab     Status: None   Collection Time: 08/03/19 12:40 PM   Specimen: Nasopharyngeal Swab  Result Value Ref Range   SARS Coronavirus 2 NEGATIVE NEGATIVE    Comment: (NOTE) If result is NEGATIVE SARS-CoV-2  target nucleic acids are NOT DETECTED. The SARS-CoV-2 RNA is generally  detectable in upper and lower  respiratory specimens during the acute phase of infection. The lowest  concentration of SARS-CoV-2 viral copies this assay can detect is 250  copies / mL. A negative result does not preclude SARS-CoV-2 infection  and should not be used as the sole basis for treatment or other  patient management decisions.  A negative result may occur with  improper specimen collection / handling, submission of specimen other  than nasopharyngeal swab, presence of viral mutation(s) within the  areas targeted by this assay, and inadequate number of viral copies  (<250 copies / mL). A negative result must be combined with clinical  observations, patient history, and epidemiological information. If result is POSITIVE SARS-CoV-2 target nucleic acids are DETECTED. The SARS-CoV-2 RNA is generally detectable in upper and lower  respiratory specimens dur ing the acute phase of infection.  Positive  results are indicative of active infection with SARS-CoV-2.  Clinical  correlation with patient history and other diagnostic information is  necessary to determine patient infection status.  Positive results do  not rule out bacterial infection or co-infection with other viruses. If result is PRESUMPTIVE POSTIVE SARS-CoV-2 nucleic acids MAY BE PRESENT.   A presumptive positive result was obtained on the submitted specimen  and confirmed on repeat testing.  While 2019 novel coronavirus  (SARS-CoV-2) nucleic acids may be present in the submitted sample  additional confirmatory testing may be necessary for epidemiological  and / or clinical management purposes  to differentiate between  SARS-CoV-2 and other Sarbecovirus currently known to infect humans.  If clinically indicated additional testing with an alternate test  methodology [phone]([id number]) is advised. The SARS-CoV-2 RNA is generally  detectable in upper and lower respiratory sp ecimens during the acute  phase of infection. The  expected result is Negative. Fact Sheet for Patients:  BoilerBrush.com.cyhttps://www.fda.gov/media/136312/download Fact Sheet for Healthcare Providers: https://pope.com/https://www.fda.gov/media/136313/download This test is not yet approved or cleared by the [place] FDA and has been authorized for detection and/or diagnosis of SARS-CoV-2 by FDA under an Emergency Use Authorization (EUA).  This EUA will remain in effect (meaning this test can be used) for the duration of the COVID-19 declaration under Section 564(b)(1) of the Act, 21 U.S.C. section 360bbb-3(b)(1), unless the authorization is terminated or revoked sooner. Performed at [facility], [address] [place], [place] [address]    Personally reviewed- dilated bowel, vibrator in the rectum/ sigmoid colon/ horizontal, no free air or fluid  Dg Abdomen 1 View  Result Date: 08/03/2019 CLINICAL DATA:  Vibrator in rectum EXAM: ABDOMEN - 1 VIEW COMPARISON:  None. FINDINGS: Radiopaque foreign body consistent with vibrator is currently in the distal sigmoid colon. No free air is seen on this supine examination. There is generalized bowel dilatation which may be indicative of either ileus or distal partial bowel obstruction. IMPRESSION: 1. Vibrator in distal sigmoid colon. No free air apparent on this supine examination. 2. Multiple loops of dilated bowel. Suspect ileus, although a degree of distal partial bowel obstruction cannot be excluded on this single supine examination. Electronically Signed   By: Bretta BangWilliam  Woodruff III M.D.   On: 08/03/2019 11:54    Assessment & Plan:  [name] [name] is a 53 y.o. male with a foreign body in the rectum he reports is a vibrator that was placed 2 days ago. He looks obstructed on his Xray and is very distended and tender. Dr. Jena Gaussourk attempted endoscopy but it  could not be removed. The patient is oriented to self, place, location, situation, time, and president.  He is at risk for rupture/ perforation of the bowel or this could have already  occurred given the leukocytosis.   -Ex lap, possible colotomy and removal of foreign body versus colectomy and colostomy will depend on what we find. -Discussed with the patient the risk of surgery including but not limited to bleeding, infection, abscess formation, perforated colon and need for colostomy, injury to other organs  -Type and screen ordered, blood consent obtained  -Reported to use cocaine  All questions were answered to the satisfaction of the patient. He does not want to have anyone know what is going on with this surgery. I have been told I can call and tell Melton KrebsJeff Ross, [phone], he went to surgery but no specifics.  Lucretia RoersLindsay C Bridges 08/03/2019, 3:43 PM

## 2019-08-03 NOTE — Anesthesia Procedure Notes (Signed)
Procedure Name: Intubation Date/Time: 08/03/2019 4:54 PM Performed by: Denese Killings, MD Pre-anesthesia Checklist: Patient identified Patient Re-evaluated:Patient Re-evaluated prior to induction Oxygen Delivery Method: Circle system utilized Preoxygenation: Pre-oxygenation with 100% oxygen Induction Type: IV induction, Rapid sequence and Cricoid Pressure applied Laryngoscope Size: 3 and Mac Grade View: Grade II Tube type: Oral Number of attempts: 1 Airway Equipment and Method: Patient positioned with wedge pillow Placement Confirmation: ETT inserted through vocal cords under direct vision,  positive ETCO2,  CO2 detector and breath sounds checked- equal and bilateral Secured at: 24 cm Tube secured with: Tape Dental Injury: Teeth and Oropharynx as per pre-operative assessment

## 2019-08-03 NOTE — ED Triage Notes (Signed)
Pt reports he has a vibrator stuck in his rectum for 1 1/2 days

## 2019-08-03 NOTE — Progress Notes (Signed)
[facility]  Rectal perforation and fecal peritonitis from foreign body.   End colostomy and colectomy performed. Unable to pass Ng and patient with high probability of ileus due to dilated bowel and fecal peritonitis, gastrostomy placed for decompression.   PATIENT DOES NOT WANT ANYONE TO KNOW SPECIFICS ABOUT HIS SURGERY OR REASONS FOR IT.   Updated his mother Enid Derry that he was out of surgery and had colostomy and gastrostomy tube due to fecal peritonitis and having obstructed nares but did not tell her what caused the perforation/ fecal peritonitis as the patient had requested.   Going to ICU for monitoring. HR 110 BP 160s Awake Cocaine positive PRN pain and PRN ativan ordered  G tube to suction  NPO Foley in place Labs in AM    Curlene Labrum, MD [facility] [place], [place] [address] [phone] (office)

## 2019-08-04 LAB — CBC WITH DIFFERENTIAL/PLATELET
Abs Immature Granulocytes: 0.08 10*3/uL — ABNORMAL HIGH (ref 0.00–0.07)
Basophils Absolute: 0 10*3/uL (ref 0.0–0.1)
Basophils Relative: 0 %
Eosinophils Absolute: 0 10*3/uL (ref 0.0–0.5)
Eosinophils Relative: 0 %
HCT: 42.7 % (ref 39.0–52.0)
Hemoglobin: 14.2 g/dL (ref 13.0–17.0)
Immature Granulocytes: 0 %
Lymphocytes Relative: 5 %
Lymphs Abs: 0.8 10*3/uL (ref 0.7–4.0)
MCH: 28.7 pg (ref 26.0–34.0)
MCHC: 33.3 g/dL (ref 30.0–36.0)
MCV: 86.4 fL (ref 80.0–100.0)
Monocytes Absolute: 0.8 10*3/uL (ref 0.1–1.0)
Monocytes Relative: 4 %
Neutro Abs: 16.6 10*3/uL — ABNORMAL HIGH (ref 1.7–7.7)
Neutrophils Relative %: 91 %
Platelets: 321 10*3/uL (ref 150–400)
RBC: 4.94 MIL/uL (ref 4.22–5.81)
RDW: 13.3 % (ref 11.5–15.5)
WBC: 18.3 10*3/uL — ABNORMAL HIGH (ref 4.0–10.5)
nRBC: 0 % (ref 0.0–0.2)

## 2019-08-04 LAB — MRSA PCR SCREENING: MRSA by PCR: NEGATIVE

## 2019-08-04 LAB — BASIC METABOLIC PANEL
Anion gap: 11 (ref 5–15)
BUN: 34 mg/dL — ABNORMAL HIGH (ref 6–20)
CO2: 22 mmol/L (ref 22–32)
Calcium: 7.6 mg/dL — ABNORMAL LOW (ref 8.9–10.3)
Chloride: 99 mmol/L (ref 98–111)
Creatinine, Ser: 1.06 mg/dL (ref 0.61–1.24)
GFR calc Af Amer: >60 mL/min (ref >60)
GFR calc non Af Amer: >60 mL/min (ref >60)
Glucose, Bld: 155 mg/dL — ABNORMAL HIGH (ref 70–99)
Potassium: 4.6 mmol/L (ref 3.5–5.1)
Sodium: 132 mmol/L — ABNORMAL LOW (ref 135–145)

## 2019-08-04 LAB — PHOSPHORUS: Phosphorus: 4.1 mg/dL (ref 2.5–4.6)

## 2019-08-04 LAB — MAGNESIUM: Magnesium: 2.2 mg/dL (ref 1.7–2.4)

## 2019-08-04 MED ORDER — AMITRIPTYLINE HCL 25 MG PO TABS: 50.0000 mg | ORAL_TABLET | Freq: Every evening | ORAL | Status: DC | PRN

## 2019-08-04 MED ORDER — ARIPIPRAZOLE 5 MG PO TABS
5.0000 mg | ORAL_TABLET | Freq: Every day | ORAL | Status: DC
  Administered 2019-08-04 – 2019-08-10 (×7): 5 mg via ORAL
  Filled 2019-08-04 (×7): qty 1

## 2019-08-04 MED ORDER — PROPOFOL 10 MG/ML IV BOLUS
INTRAVENOUS | Status: AC
  Filled 2019-08-04: qty 40

## 2019-08-04 MED ORDER — LIDOCAINE 2% (20 MG/ML) 5 ML SYRINGE
INTRAMUSCULAR | Status: AC
  Filled 2019-08-04: qty 5

## 2019-08-04 MED ORDER — AMITRIPTYLINE HCL 25 MG PO TABS
150.0000 mg | ORAL_TABLET | Freq: Every day | ORAL | Status: DC
  Administered 2019-08-04 – 2019-08-10 (×7): 150 mg via ORAL
  Filled 2019-08-04 (×7): qty 6

## 2019-08-04 MED ORDER — FENTANYL CITRATE (PF) 250 MCG/5ML IJ SOLN
INTRAMUSCULAR | Status: AC
  Filled 2019-08-04: qty 5

## 2019-08-04 MED ORDER — SUCCINYLCHOLINE CHLORIDE 200 MG/10ML IV SOSY
PREFILLED_SYRINGE | INTRAVENOUS | Status: AC
  Filled 2019-08-04: qty 10

## 2019-08-04 MED ORDER — AMITRIPTYLINE HCL 25 MG PO TABS: 150.0000 mg | ORAL_TABLET | Freq: Every day | ORAL | Status: DC

## 2019-08-04 MED ORDER — ROCURONIUM BROMIDE 10 MG/ML (PF) SYRINGE
PREFILLED_SYRINGE | INTRAVENOUS | Status: AC
  Filled 2019-08-04: qty 10

## 2019-08-04 MED ORDER — DULOXETINE HCL 60 MG PO CPEP
60.0000 mg | ORAL_CAPSULE | Freq: Two times a day (BID) | ORAL | Status: DC
  Administered 2019-08-04 – 2019-08-11 (×13): 60 mg via ORAL
  Filled 2019-08-04 (×14): qty 1

## 2019-08-04 NOTE — Addendum Note (Signed)
Addendum  created 08/04/19 1421 by Denese Killings, MD   Clinical Note Signed

## 2019-08-04 NOTE — Progress Notes (Signed)
[facility] Progress Note  1 Day Post-Op  Subjective: Looking much better. Not showing signs of septic shock. Feeling ok. G tube with 1 L out on suction. UOP improving. Drain with SS output.   Objective: Vital signs in last 24 hours: Temp:  [98 F (36.7 C)-98.8 F (37.1 C)] 98.8 F (37.1 C) (08/08 1136) Pulse Rate:  [73-130] 92 (08/08 1400) Resp:  [9-22] 19 (08/08 1400) BP: (116-191)/(60-169) 128/79 (08/08 1400) SpO2:  [91 %-100 %] 95 % (08/08 1400)    Intake/Output from previous day: 08/07 0701 - 08/08 0700 In: 4300 [I.V.:4300] Out: 1375 [Urine:675; Drains:250; Blood:200] Intake/Output this shift: Total I/O In: 120 [P.O.:120] Out: 1120 [Drains:1120]  General appearance: alert, cooperative and no distress Resp: normal work of breathing GI: soft, distended, ostomy pink and edematous, no gas in bag, midline c/d/i with honeycomb minimal drainage, G tube in place, gastric juice, JP with SS output   Lab Results:  Recent Labs    08/03/19 1234 08/04/19 0446  WBC 22.5* 18.3*  HGB 14.6 14.2  HCT 42.6 42.7  PLT 279 321   BMET Recent Labs    08/03/19 1234 08/04/19 0446  NA 126* 132*  K 3.8 4.6  CL 92* 99  CO2 24 22  GLUCOSE 111* 155*  BUN 29* 34*  CREATININE 1.10 1.06  CALCIUM 8.3* 7.6*    Studies/Results: Dg Abdomen 1 View  Result Date: 08/03/2019 CLINICAL DATA:  Vibrator in rectum EXAM: ABDOMEN - 1 VIEW COMPARISON:  None. FINDINGS: Radiopaque foreign body consistent with vibrator is currently in the distal sigmoid colon. No free air is seen on this supine examination. There is generalized bowel dilatation which may be indicative of either ileus or distal partial bowel obstruction. IMPRESSION: 1. Vibrator in distal sigmoid colon. No free air apparent on this supine examination. 2. Multiple loops of dilated bowel. Suspect ileus, although a degree of distal partial bowel obstruction cannot be excluded on this single supine examination. Electronically  Signed   By: Lowella Grip III M.D.   On: 08/03/2019 11:54    Anti-infectives: Anti-infectives (From admission, onward)   Start     Dose/Rate Route Frequency Ordered Stop   08/04/19 1000  ertapenem (INVANZ) 1,000 mg in sodium chloride 0.9 % 100 mL IVPB     1 g 200 mL/hr over 30 Minutes Intravenous Every 24 hours 08/03/19 2218 08/09/19 0959   08/04/19 0600  ertapenem (INVANZ) 1,000 mg in sodium chloride 0.9 % 100 mL IVPB     1 g 200 mL/hr over 30 Minutes Intravenous On call to O.R. 08/03/19 1608 08/03/19 1635      Assessment/Plan: Mr. Brammer is a 53 yo s/p Ex lap, hartman's and gastrostomy tube placement for rectal perforation and fecal peritonitis, nares obstruction preventing NG placement. Doing fair.  Scheduled toradol and tylenol, PRN narcotics Home meds for depression/ anxiety adding back, stop suction on G tube for 30 min after med HD ok NPO except sip/ ice and a few meds UOP improving, keep foley, lytes ok, LR @ 100 Invanz for intraabdominal contamination, did staples yesterday to midline and should have left it open due to the fecal peritonitis, will monitor the wound, and possible remove a few staples tomorrow to allow the wound to drain better  SCDs, heparin sq    LOS: 1 day    Virl Cagey 08/04/2019

## 2019-08-04 NOTE — Anesthesia Postprocedure Evaluation (Signed)
Anesthesia Post Note  Patient: [name] [name]  Procedure(s) Performed: EXPLORATORY LAPAROTOMY (N/A ) COLOSTOMY (N/A ) COLON RESECTION SIGMOID GASTROSTOMY TUBE (N/A )  Patient location during evaluation: ICU Anesthesia Type: General Level of consciousness: awake and alert and oriented Pain management: pain level controlled Vital Signs Assessment: post-procedure vital signs reviewed and stable Respiratory status: spontaneous breathing, respiratory function stable and nonlabored ventilation Cardiovascular status: blood pressure returned to baseline and stable Anesthetic complications: no     Last Vitals:  Vitals:   08/04/19 1200 08/04/19 1300  BP: 125/82 132/81  Pulse: 85 94  Resp:  17  Temp:    SpO2: 95% 94%    Last Pain:  Vitals:   08/04/19 1229  TempSrc:   PainSc: 6                  [name] [name]

## 2019-08-05 LAB — BASIC METABOLIC PANEL
Anion gap: 7 (ref 5–15)
BUN: 33 mg/dL — ABNORMAL HIGH (ref 6–20)
CO2: 26 mmol/L (ref 22–32)
Calcium: 7.4 mg/dL — ABNORMAL LOW (ref 8.9–10.3)
Chloride: 101 mmol/L (ref 98–111)
Creatinine, Ser: 0.98 mg/dL (ref 0.61–1.24)
GFR calc Af Amer: >60 mL/min (ref >60)
GFR calc non Af Amer: >60 mL/min (ref >60)
Glucose, Bld: 99 mg/dL (ref 70–99)
Potassium: 3.6 mmol/L (ref 3.5–5.1)
Sodium: 134 mmol/L — ABNORMAL LOW (ref 135–145)

## 2019-08-05 LAB — CBC WITH DIFFERENTIAL/PLATELET
Abs Immature Granulocytes: 0.05 10*3/uL (ref 0.00–0.07)
Basophils Absolute: 0 10*3/uL (ref 0.0–0.1)
Basophils Relative: 0 %
Eosinophils Absolute: 0 10*3/uL (ref 0.0–0.5)
Eosinophils Relative: 0 %
HCT: 34.3 % — ABNORMAL LOW (ref 39.0–52.0)
Hemoglobin: 11.2 g/dL — ABNORMAL LOW (ref 13.0–17.0)
Immature Granulocytes: 1 %
Lymphocytes Relative: 18 %
Lymphs Abs: 1.8 10*3/uL (ref 0.7–4.0)
MCH: 28.7 pg (ref 26.0–34.0)
MCHC: 32.7 g/dL (ref 30.0–36.0)
MCV: 87.9 fL (ref 80.0–100.0)
Monocytes Absolute: 0.9 10*3/uL (ref 0.1–1.0)
Monocytes Relative: 9 %
Neutro Abs: 7.2 10*3/uL (ref 1.7–7.7)
Neutrophils Relative %: 72 %
Platelets: 233 10*3/uL (ref 150–400)
RBC: 3.9 MIL/uL — ABNORMAL LOW (ref 4.22–5.81)
RDW: 13.4 % (ref 11.5–15.5)
WBC: 10 10*3/uL (ref 4.0–10.5)
nRBC: 0 % (ref 0.0–0.2)

## 2019-08-05 LAB — PHOSPHORUS: Phosphorus: 3.3 mg/dL (ref 2.5–4.6)

## 2019-08-05 LAB — MAGNESIUM: Magnesium: 2.5 mg/dL — ABNORMAL HIGH (ref 1.7–2.4)

## 2019-08-05 LAB — HIV ANTIBODY (ROUTINE TESTING W REFLEX): HIV Screen 4th Generation wRfx: NONREACTIVE

## 2019-08-05 MED ORDER — POTASSIUM CHLORIDE 10 MEQ/100ML IV SOLN
10.0000 meq | INTRAVENOUS | Status: AC
  Administered 2019-08-05 (×4): 10 meq via INTRAVENOUS
  Filled 2019-08-05 (×4): qty 100

## 2019-08-05 NOTE — Progress Notes (Signed)
[facility] Progress Note  2 Days Post-Op  Subjective: Looking good. Up and walking around. G tube with less output. Feeling better. More clear. Shaved. Mom at bedside.   Objective: Vital signs in last 24 hours: Temp:  [97.5 F (36.4 C)-98.1 F (36.7 C)] 97.7 F (36.5 C) (08/09 1300) Pulse Rate:  [82-104] 95 (08/09 1000) Resp:  [16-24] 24 (08/09 1000) BP: (118-136)/(73-93) 132/81 (08/09 1000) SpO2:  [92 %-99 %] 97 % (08/09 1000) Weight:  [104.2 kg] 104.2 kg (08/09 0500)    Intake/Output from previous day: 08/08 0701 - 08/09 0700 In: 2003.8 [P.O.:240; I.V.:1663.8; IV Piggyback:100] Out: 3165 [Urine:1125; Drains:2040] Intake/Output this shift: Total I/O In: 200 [P.O.:200] Out: -   General appearance: alert, cooperative and no distress Resp: normal work of breathing GI: soft, mildly distended, staples removed and every 4th left in place, minor drainage of Serous fluid, ostomy pink and only sweat in bag, no gas, some edema, G tube in place with bile draining, JP SS  Lab Results:  Recent Labs    08/04/19 0446 08/05/19 0432  WBC 18.3* 10.0  HGB 14.2 11.2*  HCT 42.7 34.3*  PLT 321 233   BMET Recent Labs    08/04/19 0446 08/05/19 0432  NA 132* 134*  K 4.6 3.6  CL 99 101  CO2 22 26  GLUCOSE 155* 99  BUN 34* 33*  CREATININE 1.06 0.98  CALCIUM 7.6* 7.4*    Anti-infectives: Anti-infectives (From admission, onward)   Start     Dose/Rate Route Frequency Ordered Stop   08/04/19 1000  ertapenem (INVANZ) 1,000 mg in sodium chloride 0.9 % 100 mL IVPB     1 g 200 mL/hr over 30 Minutes Intravenous Every 24 hours 08/03/19 2218 08/09/19 0959   08/04/19 0600  ertapenem (INVANZ) 1,000 mg in sodium chloride 0.9 % 100 mL IVPB     1 g 200 mL/hr over 30 Minutes Intravenous On call to O.R. 08/03/19 1608 08/03/19 1635      Assessment/Plan: Mr. Swire is a 53 yo POD 2 s/p Ex lap, hartman's and gastrostomy tube placement for rectal perforation and fecal  peritonitis, nares obstruction preventing NG placement. Med surg transfer today  Scheduled toradol and tylenol, PRN narcotics Home meds for depression/ anxiety adding back, stop suction on G tube for 30 min after med HD ok NPO except sip/ ice and a few meds Midline staples removed except for every 4th one due to concern for post op midline infection, some of these people have wounds left open, and I realized I had closed his too tightly, want to allow for any drainage if needed, at risk for wound infections due to contamination in case, at risk for deeper abscess to, explained to pain and mother  Dressing changes daily to midline and as needed if saturated  UOP improving, foley out, K replaced, LR @ 100 Invanz for intraabdominal contamination SCDs, heparin sq  Ostomy RN to see this week    LOS: 2 days    Virl Cagey 08/05/2019

## 2019-08-06 LAB — BASIC METABOLIC PANEL WITH GFR
Anion gap: 8 (ref 5–15)
BUN: 18 mg/dL (ref 6–20)
CO2: 25 mmol/L (ref 22–32)
Calcium: 7.7 mg/dL — ABNORMAL LOW (ref 8.9–10.3)
Chloride: 102 mmol/L (ref 98–111)
Creatinine, Ser: 0.69 mg/dL (ref 0.61–1.24)
GFR calc Af Amer: >60 mL/min
GFR calc non Af Amer: >60 mL/min
Glucose, Bld: 92 mg/dL (ref 70–99)
Potassium: 3.9 mmol/L (ref 3.5–5.1)
Sodium: 135 mmol/L (ref 135–145)

## 2019-08-06 LAB — CBC WITH DIFFERENTIAL/PLATELET
Abs Immature Granulocytes: 0.05 10*3/uL (ref 0.00–0.07)
Basophils Absolute: 0 10*3/uL (ref 0.0–0.1)
Basophils Relative: 0 %
Eosinophils Absolute: 0.1 10*3/uL (ref 0.0–0.5)
Eosinophils Relative: 1 %
HCT: 34.8 % — ABNORMAL LOW (ref 39.0–52.0)
Hemoglobin: 11.4 g/dL — ABNORMAL LOW (ref 13.0–17.0)
Immature Granulocytes: 1 %
Lymphocytes Relative: 17 %
Lymphs Abs: 1.5 10*3/uL (ref 0.7–4.0)
MCH: 28.6 pg (ref 26.0–34.0)
MCHC: 32.8 g/dL (ref 30.0–36.0)
MCV: 87.4 fL (ref 80.0–100.0)
Monocytes Absolute: 0.6 10*3/uL (ref 0.1–1.0)
Monocytes Relative: 7 %
Neutro Abs: 6.6 10*3/uL (ref 1.7–7.7)
Neutrophils Relative %: 74 %
Platelets: 241 10*3/uL (ref 150–400)
RBC: 3.98 MIL/uL — ABNORMAL LOW (ref 4.22–5.81)
RDW: 13.2 % (ref 11.5–15.5)
WBC: 8.8 10*3/uL (ref 4.0–10.5)
nRBC: 0 % (ref 0.0–0.2)

## 2019-08-06 LAB — POCT I-STAT 4, (NA,K, GLUC, HGB,HCT)
Glucose, Bld: 105 mg/dL — ABNORMAL HIGH (ref 70–99)
HCT: 42 % (ref 39.0–52.0)
Hemoglobin: 14.3 g/dL (ref 13.0–17.0)
Potassium: 3.8 mmol/L (ref 3.5–5.1)
Sodium: 129 mmol/L — ABNORMAL LOW (ref 135–145)

## 2019-08-06 LAB — PHOSPHORUS: Phosphorus: 3.4 mg/dL (ref 2.5–4.6)

## 2019-08-06 LAB — MAGNESIUM: Magnesium: 1.9 mg/dL (ref 1.7–2.4)

## 2019-08-06 MED ORDER — ENOXAPARIN SODIUM 60 MG/0.6ML ~~LOC~~ SOLN
50.0000 mg | SUBCUTANEOUS | Status: DC
  Administered 2019-08-06 – 2019-08-10 (×5): 50 mg via SUBCUTANEOUS
  Filled 2019-08-06 (×5): qty 0.6

## 2019-08-06 NOTE — Consult Note (Signed)
[place] Nurse ostomy follow up Patient receiving care in [facility] Stoma type/location:  RMQ colostomy Stomal assessment/size: 1 5/8 inches, round, budded, red, moist Peristomal assessment: intact Treatment options for stomal/peristomal skin: barrier ring Output: approximately 20 ml serosanginous in existing pouch Ostomy pouching: 2pc. 2 3/4 inches with barrier ring Education provided: all activities of change process demonstrated to patient.  All of his questions answered to his expressed satisfaction. Enrolled patient in [place] Start Discharge program: Yes Plan is to return on Wednesday and for patient to perform ostomy care activities.   [place] education material left for him to review. Val Riles, RN, MSN, CWOCN, CNS-BC, pager [phone]

## 2019-08-06 NOTE — Progress Notes (Addendum)
I was present with the medical student for this service. I personally verified the history of present illness, performed the physical exam, and made the plan for this encounter. I have verified the medical student's documentation and made modifications where appropriately. I have personally documented in my own words a brief history, physical, and plan below.     Overall improving. Awake and alert for me. Mild nausea. No bowel function. Pain ok. PT ordered. Awaiting ostomy function, dressing changed and orders placed. Lovenox changed for heparin sq.  [name] Labrum, MD [facility] [name] [name], [place] [address] [phone] (office)     [facility] Progress Note  3 Days Post-Op  Subjective: [name] was drowsy during our encounter. This morning, he complains of abdominal pain at his incision sites and reports difficulty sleeping last night, as documented by nursing staff. Patient reports sharp abdominal pain especially with moving and attributes increased pain today to being transferred to a new last night. Patient shares frustration about being in the hospital which is causing him restlessness and difficulty sleeping and that he is unable to eat. Patient requests multiple times to go home so that he can get some adequate rest with the promise that he will return for dressing changes. Patient is also curious to know when he will be able to have a full meal.  Otherwise patient denies denies nausea, vomiting, fever, chills, or other complaints. Despite his report of abdominal pain, he feels that his pain is well controlled on his scheduled pain medications.   Objective: Vital signs in last 24 hours: Temp:  [97.7 F (36.5 C)-99.2 F (37.3 C)] 99.2 F (37.3 C) (08/10 0505) Pulse Rate:  [93-103] 103 (08/10 0505) Resp:  [16] 16 (08/10 0505) BP: (132-154)/(78-95) 154/95 (08/10 0505) SpO2:  [96 %-99 %] 96 % (08/10 0505) Weight:   [104.1 kg] 104.1 kg (08/09 2240)    Intake/Output from previous day: 08/09 0701 - 08/10 0700 In: 2983.7 [P.O.:200; I.V.:2483.7; IV Piggyback:300] Out: 1600 [LFYBO:1751; Drains:25] Intake/Output this shift: No intake/output data recorded.  Physical Exam Vitals signs reviewed.  Constitutional:      Appearance: He is obese.     Comments: patient lying comfortably in bed, drowsy during examination  HENT:     Head: Normocephalic and atraumatic.  Eyes:     Extraocular Movements: Extraocular movements intact.  Cardiovascular:     Rate and Rhythm: Regular rhythm. Tachycardia present.     Heart sounds: Normal heart sounds. No murmur.  Pulmonary:     Effort: Pulmonary effort is normal.     Breath sounds: Normal breath sounds. No wheezing.  Abdominal:     General: The ostomy site is clean. Bowel sounds are decreased. There is distension.     Tenderness: There is abdominal tenderness.     Comments: G tube site at LUQ is clean draining yellow fluid, colostomy site at LLQ is pink, clean, bag contains ~62mL of deep red drainage, site of JP drain at RLQ is clean, bulb contains minimal clear serosanguinous fluid, dressing of the midline incision is clean, dry and intact  Musculoskeletal:     Comments: SCDs in place  Skin:    General: Skin is warm.     Comments: a little sweaty      Lab Results:  Recent Labs    08/05/19 0432 08/06/19 0519  WBC 10.0 8.8  HGB 11.2* 11.4*  HCT 34.3* 34.8*  PLT 233 241   BMET Recent Labs  08/05/19 0432 08/06/19 0519  NA 134* 135  K 3.6 3.9  CL 101 102  CO2 26 25  GLUCOSE 99 92  BUN 33* 18  CREATININE 0.98 0.69  CALCIUM 7.4* 7.7*    Anti-infectives: Anti-infectives (From admission, onward)   Start     Dose/Rate Route Frequency Ordered Stop   08/04/19 1000  ertapenem (INVANZ) 1,000 mg in sodium chloride 0.9 % 100 mL IVPB     1 g 200 mL/hr over 30 Minutes Intravenous Every 24 hours 08/03/19 2218 08/09/19 0959   08/04/19 0600  ertapenem  (INVANZ) 1,000 mg in sodium chloride 0.9 % 100 mL IVPB     1 g 200 mL/hr over 30 Minutes Intravenous On call to O.R. 08/03/19 1608 08/03/19 1635      Assessment/Plan: Assessment [name] [name] is a 53 y.o. man with medical history including bipolar disorder, anxiety, chronic pain, and polysubstance use who presented to the ED three days ago complaining of a foreign body lodged in his rectum for two days; confirmed on abdominal xray. This is postop day 3 after exploratory laparotomy for foreign body retrieval and Hartmann's; JP drain for perforation and fecal peritonitis; gastrostomy to decompress stomach/ small bowel.   Plan Neuro: home psych medications, scheduled pain medications  -Toradol q6  And tylenol scheduled   -  Dilaudid q2 prn  -Ativan q4 prn for anxiety  Resp: patient has been intermittently (mildy) tachypenic, I suspect this secondary to his pain, anxiety and/or withdrawal syndrome  -patient was previously on nasal cannula, this morning he is saturating well on room air   -order for incentive spirometry, out of bed activity  -working with PT   Cardio: patient has been intermittently (mildy) tachycardic, I suspect this secondary to his pain, anxiety and/or withdrawal syndrome, normotensive  -continue monitoring  GI: continue NPO status, ice chips and sips with oral medications, G tube suction, Zofran q6 prn  - Bowel sweat, awaiting function   GU: no catheter, patient reports spontaneous voiding   -IV fluids, electrolyte repletion as needed    Heme/ID: given perforation and fecal peritonitis, patient is at risk for complications   -day 3 of Ertapenem post op for contamination, JP drain over rectal  stump  -afebrile, initial leukocytosis, now within normal range for two days  -monitor patient closely for developing signs/symptoms of infection  Prophylaxis: SCDs, heparin injection will change to lovenox now   Consults: patient expresses interest in meeting with ostomy  care, PT   Dressing changes q shift due to saturation from drainage which is expected.       LOS: 3 days    [name] [name], Medical Student 08/06/2019

## 2019-08-06 NOTE — Progress Notes (Signed)
Patient pain/cramping is yet still present and the patient's anxiety level has increased and appears to be climbing (r/t pain). Patient to receive PRN anxiety medication as well as order pain medication.

## 2019-08-06 NOTE — Progress Notes (Signed)
Patient is reporting cramping to LLQ and LUQ. Interventions: -ABD checked and midline dressing remains intact with no change in breakthrough drainage noted at the patient's arrival to the unit -JP drain intact and remains charged -Colostomy is intact and draining (marroon colored liquid [scant amount]) -Suctioning from PEG remains low and continuous  Patient to be medicated with PRN meds

## 2019-08-06 NOTE — Progress Notes (Signed)
This RN to the bedside to remind the patient that he has already received his PRN Lorazepam (2 mg). -the patient then began to indicate the medication he takes at home -this RN informed the ordered medication is to aid with anxiety and that he received the medication to minimize the anxiety/stress create from being in a strange environment thus, hopefully, allowing him to get some rest (the reported his understanding) -patient also reminded/informed, the aforementioned, medication can be given every 4 hours and therefore could not be given again until ~0400

## 2019-08-06 NOTE — TOC Initial Note (Signed)
Transition of Care [facility](TOC) - Initial/Assessment Note    Patient Details  Name: [name] [name] MRN: [id number] Date of Birth: 05/04/1966  Transition of Care [facility](TOC) CM/SW Contact:    Ida Rogueodney B Nolah Krenzer, LCSW Phone Number: 08/06/2019, 3:58 PM  Clinical Narrative:   [name] [name] is [facility] for visitation several times a week.  He has no preference of providers.  "My insurance told me I am eligible for that service.  Just make sure my insurance will cover it."   He states his mother is a support and plans to be involved with helping him post d/c. CSW contacted [facility], [facility] and [facility].  All said no to current services.  Contacted [facility], and Bonita QuinLinda stated she needs more red, white and blue MCR patients to be able to take [employer].  Did not locate provider today.  TOC will continue to work on finding this service.  Left message for Ms Beverely PaceBryant at [facility].            Expected Discharge Plan: Home w Home Health Services Barriers to Discharge: No Barriers Identified   Patient Goals and CMS Choice Patient states their goals for this hospitalization and ongoing recovery are:: "My insurance told me I'm eligible for a visit 3 times a week.  That sounds good to me.  Can you set it up for me?" CMS Medicare.gov Compare Post Acute Care list provided to:: Patient Choice offered to / list presented to : Patient  Expected Discharge Plan and Services Expected Discharge Plan: Home w Home Health Services   Discharge Planning Services: CM Consult Post Acute Care Choice: Home Health Living arrangements for the past 2 months: Apartment Expected Discharge Date: 08/06/19                         [facility] RN          Prior Living Arrangements/Services Living arrangements for the past 2 months: Apartment Lives with:: Self Patient language and need for interpreter reviewed:: Yes Do you feel safe going back to the place where you live?: Yes      Need for Family Participation in  Patient Care: Yes (Comment) Care giver support system in place?: Yes (comment)   Criminal Activity/Legal Involvement Pertinent to Current Situation/Hospitalization: No - Comment as needed  Activities of Daily Living Home Assistive Devices/Equipment: None ADL Screening (condition at time of admission) Patient's cognitive ability adequate to safely complete daily activities?: Yes Is the patient deaf or have difficulty hearing?: No Does the patient have difficulty seeing, even when wearing glasses/contacts?: No Does the patient have difficulty concentrating, remembering, or making decisions?: No Patient able to express need for assistance with ADLs?: Yes Does the patient have difficulty dressing or bathing?: No Independently performs ADLs?: Yes (appropriate for developmental age) Does the patient have difficulty walking or climbing stairs?: No Weakness of Legs: None Weakness of Arms/Hands: None  Permission Sought/Granted                  Emotional Assessment Appearance:: Appears stated age Attitude/Demeanor/Rapport: Engaged Affect (typically observed): Appropriate Orientation: : Oriented to Self, Oriented to Place, Oriented to  Time, Oriented to Situation Alcohol / Substance Use: Illicit Drugs Psych Involvement: No (comment)  Admission diagnosis:  Perforated rectum (HCC) [K63.1] Patient Active Problem List   Diagnosis Date Noted  . Perforated rectum (HCC) 08/03/2019  . Nasal obstruction 08/03/2019  . Fecal peritonitis (HCC) 08/03/2019  . Foreign body of rectum   .  Complete intestinal obstruction ([place])   . Ileus, postoperative ([place])   . Cellulitis 06/12/2017  . GASTROENTERITIS, ACUTE 01/22/2008  . FEVER UNSPECIFIED 11/10/2007  . DIZZINESS 11/06/2007  . HYPERLIPIDEMIA 04/26/2007  . DISORDER, BIPOLAR NOS 04/26/2007  . DEPRESSION 04/26/2007  . ALLERGIC RHINITIS 04/26/2007   PCP:  Maurice Small, MD Pharmacy:   [facility], [address] [place] [address] Phone: [phone] Fax: [phone]     Social Determinants of Health (SDOH) Interventions    Readmission Risk Interventions Readmission Risk Prevention Plan 08/06/2019  Post Dischage Appt Complete  Medication Screening Complete  Transportation Screening Complete  Some recent data might be hidden

## 2019-08-06 NOTE — Progress Notes (Signed)
Patient called out (via call bell) to request something to aid with sleeping.

## 2019-08-06 NOTE — Care Management Important Message (Signed)
Important Message  Patient Details  Name: [name] [name] MRN: [id number] Date of Birth: 02-17-1966   Medicare Important Message Given:  Yes     Tommy Medal 08/06/2019, 2:32 PM

## 2019-08-06 NOTE — Progress Notes (Signed)
Patient is anxious and asking for Ativan to aid with sleep when the pain medication ordered for midnight is brought to him. -patient made aware is request is to be obliged

## 2019-08-06 NOTE — Progress Notes (Signed)
The patient has not settled down much and does appear mildly restless. -patient reports not being able to rest/sleep Will continue to monitor and intervene as per the order or should the patient's condition worsens

## 2019-08-07 ENCOUNTER — Encounter (HOSPITAL_COMMUNITY): Payer: Self-pay | Admitting: General Surgery

## 2019-08-07 LAB — CBC WITH DIFFERENTIAL/PLATELET
Abs Immature Granulocytes: 0.12 10*3/uL — ABNORMAL HIGH (ref 0.00–0.07)
Basophils Absolute: 0 10*3/uL (ref 0.0–0.1)
Basophils Relative: 0 %
Eosinophils Absolute: 0.1 10*3/uL (ref 0.0–0.5)
Eosinophils Relative: 1 %
HCT: 34.7 % — ABNORMAL LOW (ref 39.0–52.0)
Hemoglobin: 11.5 g/dL — ABNORMAL LOW (ref 13.0–17.0)
Immature Granulocytes: 1 %
Lymphocytes Relative: 20 %
Lymphs Abs: 2 10*3/uL (ref 0.7–4.0)
MCH: 28.5 pg (ref 26.0–34.0)
MCHC: 33.1 g/dL (ref 30.0–36.0)
MCV: 85.9 fL (ref 80.0–100.0)
Monocytes Absolute: 0.7 10*3/uL (ref 0.1–1.0)
Monocytes Relative: 7 %
Neutro Abs: 7.1 10*3/uL (ref 1.7–7.7)
Neutrophils Relative %: 71 %
Platelets: 265 10*3/uL (ref 150–400)
RBC: 4.04 MIL/uL — ABNORMAL LOW (ref 4.22–5.81)
RDW: 12.8 % (ref 11.5–15.5)
WBC: 10.1 10*3/uL (ref 4.0–10.5)
nRBC: 0 % (ref 0.0–0.2)

## 2019-08-07 LAB — BASIC METABOLIC PANEL
Anion gap: 10 (ref 5–15)
BUN: 17 mg/dL (ref 6–20)
CO2: 23 mmol/L (ref 22–32)
Calcium: 7.8 mg/dL — ABNORMAL LOW (ref 8.9–10.3)
Chloride: 101 mmol/L (ref 98–111)
Creatinine, Ser: 0.68 mg/dL (ref 0.61–1.24)
GFR calc Af Amer: >60 mL/min (ref >60)
GFR calc non Af Amer: >60 mL/min (ref >60)
Glucose, Bld: 85 mg/dL (ref 70–99)
Potassium: 3.9 mmol/L (ref 3.5–5.1)
Sodium: 134 mmol/L — ABNORMAL LOW (ref 135–145)

## 2019-08-07 LAB — PHOSPHORUS: Phosphorus: 4.2 mg/dL (ref 2.5–4.6)

## 2019-08-07 LAB — MAGNESIUM: Magnesium: 1.9 mg/dL (ref 1.7–2.4)

## 2019-08-07 NOTE — Evaluation (Signed)
Physical Therapy Evaluation Patient Details Name: [name] MaxinMark D [name] MRN: [id number] DOB: 10/20/1966 Today's Date: 08/07/2019   History of Present Illness  [name] is a 53 yo male, s/p  Low anterior resection of sigmoid colon and rectum, end colostomy, gastrostomy tube placement, JP drain placement, removal of foreign body 08/03/19, with chronic pain, substance abuse who has told people he did cocaine a few days ago who presented to the ED with abdominal pain, nausea/vomiting, no BMs after inserting a vibrator in his rectum 2 days prior. He says that he has not had a BM and that he has been not able to eat or drink much since that time.  He has never had abdominal surgery. He reports that he is on chronic pain medication and [facility] [address].    Clinical Impression  Patient demonstrates slightly labored movement for sitting up at bedside without aggravating abdominal pain at surgical site, c/o dizziness upon sitting up and when standing that resolved, ambulated in room without loss of balance or use of AD, limited mostly due to c/o fatigue and pain at surgical site - RN notified.  Patient tolerated sitting up in chair after therapy.  Patient will benefit from continued physical therapy in hospital and recommended venue below to increase strength, balance, endurance for safe ADLs and gait.    Follow Up Recommendations No PT follow up;Supervision - Intermittent    Equipment Recommendations  None recommended by PT    Recommendations for Other Services       Precautions / Restrictions Precautions Precautions: Fall Restrictions Weight Bearing Restrictions: No      Mobility  Bed Mobility Overal bed mobility: Modified Independent             General bed mobility comments: increased time, slightly labored movement  Transfers Overall transfer level: Needs assistance Equipment used: None;1 person hand held assist Transfers: Sit to/from Stand;Stand Pivot  Transfers Sit to Stand: Supervision Stand pivot transfers: Supervision       General transfer comment: occasional leaning on wall intitally mostly due to c/o dizziness  Ambulation/Gait Ambulation/Gait assistance: Supervision Gait Distance (Feet): 55 Feet Assistive device: None Gait Pattern/deviations: Decreased step length - right;Decreased step length - left;Decreased stride length Gait velocity: decreased   General Gait Details: slightly labored cadence without need of AD, limited to walking in room due to connected to drain, no loss of balance  Stairs            Wheelchair Mobility    Modified Rankin (Stroke Patients Only)       Balance Overall balance assessment: Mild deficits observed, not formally tested                                           Pertinent Vitals/Pain Pain Assessment: 0-10 Pain Score: 7  Pain Location: abdomen at surgical site Pain Descriptors / Indicators: Sore Pain Intervention(s): Limited activity within patient's tolerance;Monitored during session;Patient requesting pain meds-RN notified    Home Living Family/patient expects to be discharged to:: Private residence Living Arrangements: Non-relatives/Friends Available Help at Discharge: Friend(s);Available PRN/intermittently Type of Home: House Home Access: Stairs to enter Entrance Stairs-Rails: None Entrance Stairs-Number of Steps: 2 Home Layout: One level Home Equipment: Cane - single point      Prior Function Level of Independence: Independent with assistive device(s)         Comments:  Community ambulator with SPC PRN, drives     Hand Dominance        Extremity/Trunk Assessment   Upper Extremity Assessment Upper Extremity Assessment: Overall WFL for tasks assessed    Lower Extremity Assessment Lower Extremity Assessment: Generalized weakness    Cervical / Trunk Assessment Cervical / Trunk Assessment: Normal  Communication   Communication: No  difficulties  Cognition Arousal/Alertness: Awake/alert Behavior During Therapy: WFL for tasks assessed/performed Overall Cognitive Status: Within Functional Limits for tasks assessed                                        General Comments      Exercises     Assessment/Plan    PT Assessment Patient needs continued PT services  PT Problem List Decreased strength;Decreased activity tolerance;Decreased balance;Decreased mobility       PT Treatment Interventions Gait training;Stair training;Functional mobility training;Therapeutic exercise;Patient/family education;Balance training    PT Goals (Current goals can be found in the Care Plan section)  Acute Rehab PT Goals Patient Stated Goal: return home PT Goal Formulation: With patient Time For Goal Achievement: 08/12/19 Potential to Achieve Goals: Good    Frequency Min 4X/week   Barriers to discharge        Co-evaluation               AM-PAC PT "6 Clicks" Mobility  Outcome Measure Help needed turning from your back to your side while in a flat bed without using bedrails?: None Help needed moving from lying on your back to sitting on the side of a flat bed without using bedrails?: None Help needed moving to and from a bed to a chair (including a wheelchair)?: None Help needed standing up from a chair using your arms (e.g., wheelchair or bedside chair)?: None Help needed to walk in hospital room?: A Little Help needed climbing 3-5 steps with a railing? : A Little 6 Click Score: 22    End of Session   Activity Tolerance: Patient tolerated treatment well;Patient limited by fatigue;Patient limited by pain Patient left: in chair;with call bell/phone within reach Nurse Communication: Mobility status PT Visit Diagnosis: Unsteadiness on feet (R26.81);Other abnormalities of gait and mobility (R26.89);Muscle weakness (generalized) (M62.81)    Time: 4709-6283 PT Time Calculation (min) (ACUTE ONLY): 24  min   Charges:   PT Evaluation $PT Eval Moderate Complexity: 1 Mod PT Treatments $Therapeutic Activity: 23-37 mins        12:10 PM, 08/07/19 Lonell Grandchild, MPT Physical Therapist with [facility] [phone] office [phone] mobile phone

## 2019-08-07 NOTE — Plan of Care (Signed)
  Problem: Acute Rehab PT Goals(only PT should resolve) Goal: Pt will Roll Supine to Side Outcome: Progressing Flowsheets (Taken 08/07/2019 1214) Pt will Roll Supine to Side: Independently Goal: Pt Will Go Supine/Side To Sit Outcome: Progressing Flowsheets (Taken 08/07/2019 1214) Pt will go Supine/Side to Sit: Independently Goal: Patient Will Transfer Sit To/From Stand Outcome: Progressing Flowsheets (Taken 08/07/2019 1214) Patient will transfer sit to/from stand: Independently Goal: Pt Will Transfer Bed To Chair/Chair To Bed Outcome: Progressing Flowsheets (Taken 08/07/2019 1214) Pt will Transfer Bed to Chair/Chair to Bed: Independently Goal: Pt Will Ambulate Outcome: Progressing Flowsheets (Taken 08/07/2019 1214) Pt will Ambulate:  with modified independence  > 125 feet  with cane Note: Use cane if necessary   12:15 PM, 08/07/19 Lonell Grandchild, MPT Physical Therapist with [facility] [phone] office [phone] mobile phone

## 2019-08-07 NOTE — Progress Notes (Addendum)
I was present with the medical student for this service. I personally verified the history of present illness, performed the physical exam, and made the plan for this encounter. I have verified the medical student's documentation and made modifications where appropriately. I have personally documented in my own words a brief history, physical, and plan below.    Improving. Having some gas in bag. Still with some nausea. Overall progressing well. Has not been up walking. Says he doesn't need  PT but I told him he needs to walk.   No signs of infection. Continued ileus. Clamp G tube today and juice / sips today Hopefully ostomy starting to work   [name] Labrum, MD [facility] [place], [place] [address] [phone] (office)       [facility] Progress Note  4 Days Post-Op  Subjective: [name] [name] was sitting up in the recliner during our encounter, states he had just finished his PT session. Patient endorsed continued sharp, stabbing abdominal pain at the site of incisions exacerbated with activity and movement. Rated 9/10 pain at it's worst. However, patient still reports that his pain medication regimen is effective. This morning he has new itching under the dressing of his midline incision that he feels is an indication of his healing. Patient reports flatus and sensation of his bowels moving.   Patient reports a better night sleep. Patient denies experiencing any withdrawal symptoms.    Objective: Vital signs in last 24 hours: Temp:  [98.9 F (37.2 C)-99.4 F (37.4 C)] 98.9 F (37.2 C) (08/11 0528) Pulse Rate:  [85-102] 88 (08/11 0528) Resp:  [16-20] 20 (08/11 0528) BP: (137-155)/(83-90) 142/90 (08/11 0528) SpO2:  [90 %-97 %] 97 % (08/11 0528)    Intake/Output from previous day: 08/10 0701 - 08/11 0700 In: 2396.1 [I.V.:2266.1; IV Piggyback:100] Out: 1125 [Urine:1125] Intake/Output this shift: No  intake/output data recorded.  Physical Exam Vitals signs and nursing note reviewed.  Constitutional:      Appearance: He is obese.  HENT:     Head: Normocephalic and atraumatic.  Eyes:     Extraocular Movements: Extraocular movements intact.  Cardiovascular:     Rate and Rhythm: Normal rate and regular rhythm.     Heart sounds: No murmur.  Pulmonary:     Effort: Pulmonary effort is normal.     Breath sounds: Normal breath sounds.  Abdominal:     General: Bowel sounds are normal. There is distension.     Tenderness: There is abdominal tenderness.     Comments: diffuse tenderness throughout  G tube site at LUQ no suction, colostomy site at LLQ appears health, clean, bag is partially inflated and contains small amount of brown stool, site of JP drain at RLQ is clean, bulb contains clear serosanguinous fluid, dressing of the midline incision is clean, dry and intact   Skin:    Comments: nicotine patch over right deltoid  Neurological:     Mental Status: He is alert.     Lab Results:  Recent Labs    08/06/19 0519 08/07/19 0504  WBC 8.8 10.1  HGB 11.4* 11.5*  HCT 34.8* 34.7*  PLT 241 265   BMET Recent Labs    08/06/19 0519 08/07/19 0504  NA 135 134*  K 3.9 3.9  CL 102 101  CO2 25 23  GLUCOSE 92 85  BUN 18 17  CREATININE 0.69 0.68  CALCIUM 7.7* 7.8*    Anti-infectives: Anti-infectives (From admission, onward)   Start  Dose/Rate Route Frequency Ordered Stop   08/04/19 1000  ertapenem (INVANZ) 1,000 mg in sodium chloride 0.9 % 100 mL IVPB     1 g 200 mL/hr over 30 Minutes Intravenous Every 24 hours 08/03/19 2218 08/09/19 0959   08/04/19 0600  ertapenem (INVANZ) 1,000 mg in sodium chloride 0.9 % 100 mL IVPB     1 g 200 mL/hr over 30 Minutes Intravenous On call to O.R. 08/03/19 1608 08/03/19 1635      Assessment/Plan: s/p Procedure(s): EXPLORATORY LAPAROTOMY COLOSTOMY COLON RESECTION SIGMOID GASTROSTOMY TUBE  Assessment [name] [name] is a 53 y.o. man  with medical history including bipolar disorder, anxiety, chronic pain, and polysubstance use who presented to the ED three days ago complaining of a foreign body lodged in his rectum for two days; confirmed on abdominal xray. This is postop day 4 after exploratory laparotomy for foreign body retrieval and Hartmann's; JP drain for perforation and fecal peritonitis; gastrostomy to decompress stomach/small bowel.    Plan Neuro: Home psych medications, scheduled pain medications             -Toradol q6 and Tylenol scheduled              -Dilaudid q2 prn             -Ativan q4 prn for anxiety  Resp:              -Order for incentive spirometry, out of bed activity             -Working with PT   Cardio:              -Telemetry discontinued  -Vitals q4   GI: Stool and gas in ostomy bag this morning             -Liquids advanced to one juice every 2 hours  -G tube clamped  -observing patient for return of bowel function  -Zofran q6 for nausea  GU: Spontaneous voiding              -LR at 100 mL/hr  -Trending electrolytes with repletion as needed               Heme/ID: Given rectal perforation and fecal peritonitis, patient is at risk for complications             -Day 4 of Ertapenem post op for contamination, JP drain over rectal stump  -Dressing changes q shift due to saturation from drainage             -Continues to be afebrile, WBC increased since yesterday but is still within normal limits  -Hemoglobin is stable             -Monitoring patient closely for developing signs/symptoms of infection  Prophylaxis: SCDs, lovenox    Consults: Patient met with ostomy care team and PT this morning, social work is coordinating home health care on discharge    LOS: 4 days    Timelie Horne 08/07/2019  

## 2019-08-07 NOTE — TOC Progression Note (Addendum)
Transition of Care [facility]) - Progression Note    Patient Details  Name: [name] [name] MRN: [id number] Date of Birth: [birth year]  Transition of Care [facility]) CM/SW Contact  Malasia Torain, Chauncey Reading, RN Phone Number: 08/07/2019, 3:04 PM  Clinical Narrative:   Referred to [facility] Shon Baton [phone]) awaiting answer if they can accept referral. Patient has been declined by [facility], [facility], [facility] and [facility].    ADDENDUM [facility] can not accept referral. Call placed to [facility], she will call [employer] and ask can they accept referral.    Expected Discharge Plan: [place] Barriers to Discharge: No Barriers Identified  Expected Discharge Plan and Services Expected Discharge Plan: [place]   Discharge Planning Services: CM Consult Post Acute Care Choice: [place] arrangements for the past 2 months: Apartment Expected Discharge Date: 08/06/19                         [facility] Arranged: RN           Social Determinants of Health (SDOH) Interventions    Readmission Risk Interventions Readmission Risk Prevention Plan 08/06/2019  Post Dischage Appt Complete  Medication Screening Complete  Transportation Screening Complete  Some recent data might be hidden

## 2019-08-08 LAB — BASIC METABOLIC PANEL
Anion gap: 9 (ref 5–15)
BUN: 12 mg/dL (ref 6–20)
CO2: 25 mmol/L (ref 22–32)
Calcium: 7.8 mg/dL — ABNORMAL LOW (ref 8.9–10.3)
Chloride: 101 mmol/L (ref 98–111)
Creatinine, Ser: 0.66 mg/dL (ref 0.61–1.24)
GFR calc Af Amer: >60 mL/min (ref >60)
GFR calc non Af Amer: >60 mL/min (ref >60)
Glucose, Bld: 96 mg/dL (ref 70–99)
Potassium: 3.9 mmol/L (ref 3.5–5.1)
Sodium: 135 mmol/L (ref 135–145)

## 2019-08-08 LAB — MAGNESIUM: Magnesium: 1.9 mg/dL (ref 1.7–2.4)

## 2019-08-08 LAB — PHOSPHORUS: Phosphorus: 4.1 mg/dL (ref 2.5–4.6)

## 2019-08-08 MED ORDER — ACETAMINOPHEN 500 MG PO TABS
1000.0000 mg | ORAL_TABLET | Freq: Four times a day (QID) | ORAL | Status: DC
  Administered 2019-08-08 – 2019-08-10 (×8): 1000 mg via ORAL
  Administered 2019-08-11: 05:00:00 500 mg via ORAL
  Administered 2019-08-11: 11:00:00 1000 mg via ORAL
  Filled 2019-08-08 (×10): qty 2

## 2019-08-08 MED ORDER — OXYCODONE HCL 5 MG PO TABS
5.0000 mg | ORAL_TABLET | ORAL | Status: DC | PRN
  Administered 2019-08-09 – 2019-08-11 (×6): 10 mg via ORAL
  Filled 2019-08-08 (×6): qty 2

## 2019-08-08 NOTE — Progress Notes (Signed)
[facility] at [phone], not able to provide services in [place] at this time

## 2019-08-08 NOTE — TOC Progression Note (Signed)
Transition of Care [facility]) - Progression Note    Patient Details  Name: [name] [name] MRN: [id number] Date of Birth: [birth year]  Transition of Care [facility]) CM/SW [place], [place] Phone Number: 08/08/2019, 11:09 AM  Clinical Narrative:   Vaughan Basta with [facility] states that this patient was not approved for services by [employer].    Expected Discharge Plan: [place] Barriers to Discharge: No Barriers Identified  Expected Discharge Plan and Services Expected Discharge Plan: [place]   Discharge Planning Services: CM Consult Post Acute Care Choice: [place] arrangements for the past 2 months: Apartment Expected Discharge Date: 08/06/19                         [facility] Arranged: RN           Social Determinants of Health (SDOH) Interventions    Readmission Risk Interventions Readmission Risk Prevention Plan 08/06/2019  Post Dischage Appt Complete  Medication Screening Complete  Transportation Screening Complete  Some recent data might be hidden

## 2019-08-08 NOTE — Care Management Important Message (Signed)
Important Message  Patient Details  Name: [name] [name] MRN: [id number] Date of Birth: Apr 20, 1966   Medicare Important Message Given:  Yes     Tommy Medal 08/08/2019, 1:57 PM

## 2019-08-08 NOTE — Consult Note (Signed)
[place] Nurse ostomy follow up Patient receiving care in [facility] [address] Stoma type/location: RLQ colostomy Stomal assessment/size: 1 5/8 inches, red, moist, producing pudding consistency brown stool Peristomal assessment: intact Treatment options for stomal/peristomal skin: barrier ring Output:  As above Ostomy pouching: 2pc. Flat cut to fit Education provided: Patient emptied existing pouch, assisted with measuring, cut the new barrier, placed the barrier ring on the skin barrier, participated in placing the new skin barrier, and snapped the new pouch onto the skin barrier.  He has been burping his pouch.  He knows how to empty the pouch and clean the "tail" of the pouch.  We talked about hernias and measures to help prevent.  And, we talked about no items are to be inserted into the stoma unless his physician instructs him to, such as a suppository.  We also discussed diets. Enrolled patient in [place] Start Discharge program: Yes on 8/10. I instructed patient to make sure he has 4 pouches, skin barriers and rings to take home with him when he is discharged; and, not to leave his supplies or teaching materials here when discharged.  Val Riles, RN, MSN, CWOCN, CNS-BC, pager [phone]

## 2019-08-08 NOTE — Progress Notes (Signed)
Physical Therapy Treatment Patient Details Name: [name] [name] MRN: [id number] DOB: 12/18/1966 Today's Date: 08/08/2019    History of Present Illness [name] is a 53 yo male, s/p  Low anterior resection of sigmoid colon and rectum, end colostomy, gastrostomy tube placement, JP drain placement, removal of foreign body 08/03/19, with chronic pain, substance abuse who has told people he did cocaine a few days ago who presented to the ED with abdominal pain, nausea/vomiting, no BMs after inserting a vibrator in his rectum 2 days prior. He says that he has not had a BM and that he has been not able to eat or drink much since that time.  He has never had abdominal surgery. He reports that he is on chronic pain medication and [facility] [address].    PT Comments    Patient demonstrates increased endurance/distance for ambulation with good return for walking on level, inclined and declined surfaces without loss balance and c/o less pain at surgical during functional mobility.  Patient tolerated sitting up in chair after therapy - RN aware.  Patient will benefit from continued physical therapy in hospital and recommended venue below to increase strength, balance, endurance for safe ADLs and gait.    Follow Up Recommendations  No PT follow up;Supervision - Intermittent     Equipment Recommendations  None recommended by PT    Recommendations for Other Services       Precautions / Restrictions Precautions Precautions: Fall Restrictions Weight Bearing Restrictions: No    Mobility  Bed Mobility Overal bed mobility: Modified Independent             General bed mobility comments: increased time, slightly labored movement  Transfers Overall transfer level: Modified independent   Transfers: Sit to/from Stand;Stand Pivot Transfers Sit to Stand: Modified independent (Device/Increase time) Stand pivot transfers: Modified independent (Device/Increase time)        General transfer comment: requires less assistance, no leaning on nearby objects for support  Ambulation/Gait Ambulation/Gait assistance: Supervision;Modified independent (Device/Increase time) Gait Distance (Feet): 200 Feet Assistive device: None Gait Pattern/deviations: Decreased step length - right;Decreased step length - left;Decreased stride length Gait velocity: decreased   General Gait Details: demonstrates increased endurance/distance for ambulation with slightly labored cadence without loss of balance on level, inclined and declined surfaces   Stairs             Wheelchair Mobility    Modified Rankin (Stroke Patients Only)       Balance Overall balance assessment: Mild deficits observed, not formally tested                                          Cognition Arousal/Alertness: Awake/alert Behavior During Therapy: WFL for tasks assessed/performed Overall Cognitive Status: Within Functional Limits for tasks assessed                                        Exercises      General Comments        Pertinent Vitals/Pain Pain Assessment: Faces Faces Pain Scale: Hurts a little bit Pain Location: abdomen at surgical site Pain Descriptors / Indicators: Dull;Aching Pain Intervention(s): Limited activity within patient's tolerance;Monitored during session;Premedicated before session    Home Living  Prior Function            PT Goals (current goals can now be found in the care plan section) Acute Rehab PT Goals Patient Stated Goal: return home PT Goal Formulation: With patient Time For Goal Achievement: 08/12/19 Potential to Achieve Goals: Good Progress towards PT goals: Progressing toward goals    Frequency    Min 4X/week      PT Plan Current plan remains appropriate    Co-evaluation              AM-PAC PT "6 Clicks" Mobility   Outcome Measure  Help needed turning from  your back to your side while in a flat bed without using bedrails?: None Help needed moving from lying on your back to sitting on the side of a flat bed without using bedrails?: None Help needed moving to and from a bed to a chair (including a wheelchair)?: None Help needed standing up from a chair using your arms (e.g., wheelchair or bedside chair)?: None Help needed to walk in hospital room?: A Little Help needed climbing 3-5 steps with a railing? : A Little 6 Click Score: 22    End of Session   Activity Tolerance: Patient tolerated treatment well;Patient limited by fatigue Patient left: in chair;with call bell/phone within reach Nurse Communication: Mobility status PT Visit Diagnosis: Unsteadiness on feet (R26.81);Other abnormalities of gait and mobility (R26.89);Muscle weakness (generalized) (M62.81)     Time: 6283-1517 PT Time Calculation (min) (ACUTE ONLY): 21 min  Charges:  $Gait Training: 8-22 mins                     2:18 PM, 08/08/19 Lonell Grandchild, MPT Physical Therapist with [facility] [phone] office [phone] mobile phone

## 2019-08-08 NOTE — Progress Notes (Addendum)
I was present with the medical student for this service. I personally verified the history of present illness, performed the physical exam, and made the plan for this encounter. I have verified the medical student's documentation and made modifications where appropriately. I have personally documented in my own words a brief history, physical, and plan below.   S/p end colostomy. Having bowel function. Feeling well. Working with PT and cleared. Soft, wound draining, ostomy pink with stool.  Soft diet now Social work trying to figure out home health    Stopped LR Roxi added Home in the next few days Will plan to remove JP prior to d/c [employer] post op   Curlene Labrum, MD [facility] [place], [place] [address] [phone] (office)    [facility] Progress Note  5 Days Post-Op   Subjective:  Mr. Greenlaw is doing well this morning, reporting that he slept well last night and has broth, coffee, jello and apple juice this morning. Patient reports continued sharp, cramping and "spiking" that occurs both spontaneously and with increased activity or drinking. Patient endorses sensation of flatus and feeling his bowel move. Patient still has some itching at his midline incision, but it is not bothersome enough to require Benadryl.    Objective: Vital signs in last 24 hours: Temp:  [98.3 F (36.8 C)-98.6 F (37 C)] 98.6 F (37 C) (08/12 0533) Pulse Rate:  [83-89] 83 (08/12 0533) Resp:  [16-20] 20 (08/12 0533) BP: (112-131)/(77-90) 112/77 (08/12 0533) SpO2:  [97 %-99 %] 99 % (08/12 0533) Last BM Date: 08/07/19  Intake/Output from previous day: 08/11 0701 - 08/12 0700 In: 2463 [P.O.:240; I.V.:2223] Out: 1355 [Urine:1300; Drains:35; Stool:20] Intake/Output this shift: Total I/O In: 480 [P.O.:480] Out: 450 [Urine:450]  Physical Exam HENT:     Head: Normocephalic and atraumatic.     Mouth/Throat:     Mouth:  Mucous membranes are moist.  Eyes:     Extraocular Movements: Extraocular movements intact.  Cardiovascular:     Rate and Rhythm: Normal rate and regular rhythm.     Heart sounds: Normal heart sounds.  Pulmonary:     Effort: Pulmonary effort is normal.     Breath sounds: Normal breath sounds.  Abdominal:     General: Bowel sounds are normal. There is distension.     Palpations: Abdomen is soft.     Tenderness: There is abdominal tenderness.     Comments: G tube site at LUQ is clamped, colostomy site at LLQ appears health, clean, bag contains soft brown stool, site of JP drain at RLQ is clean, bulb contains small amount of clear serosanguinous fluid, dressing of the midline incision has some yellow discharge saturating through   Skin:    General: Skin is warm and dry.  Neurological:     Mental Status: He is alert.     Lab Results:  Recent Labs    08/06/19 0519 08/07/19 0504  WBC 8.8 10.1  HGB 11.4* 11.5*  HCT 34.8* 34.7*  PLT 241 265   BMET Recent Labs    08/07/19 0504 08/08/19 0430  NA 134* 135  K 3.9 3.9  CL 101 101  CO2 23 25  GLUCOSE 85 96  BUN 17 12  CREATININE 0.68 0.66  CALCIUM 7.8* 7.8*   PT/INR No results for input(s): LABPROT, INR in the last 72 hours.  Studies/Results: No results found.  Anti-infectives: Anti-infectives (From admission, onward)   Start     Dose/Rate Route  Frequency Ordered Stop   08/04/19 1000  ertapenem (INVANZ) 1,000 mg in sodium chloride 0.9 % 100 mL IVPB     1 g 200 mL/hr over 30 Minutes Intravenous Every 24 hours 08/03/19 2218 08/08/19 0933   08/04/19 0600  ertapenem (INVANZ) 1,000 mg in sodium chloride 0.9 % 100 mL IVPB     1 g 200 mL/hr over 30 Minutes Intravenous On call to O.R. 08/03/19 1608 08/03/19 1635      Problem List Items Addressed This Visit      Digestive   * (Principal) Foreign body of rectum - Primary       Assessment/Plan: Procedure(s): EXPLORATORY LAPAROTOMY COLOSTOMY COLON RESECTION  SIGMOID GASTROSTOMY TUBE  Assessment Rishon D Comeris a53 y.o.man with medical history including bipolar disorder, anxiety, chronic pain, and polysubstance use who presented to the ED three days ago complaining of a foreign body lodged in his rectum for two days; confirmed on abdominal xray. This is postop day 5 after exploratory laparotomy for foreign body retrieval and Hartmann's; JP drain for perforation and fecal peritonitis; gastrostomy to decompressstomach/small bowel. Patient is recovering well.    Plan Neuro: Home psych medications, scheduled pain medications -Toradol q6 PRNand Tylenol scheduled  -Dilaudid q2 prn  - Added roxicodone for PRN for oral option, encouraged to use  -Ativan q4 prn for anxiety              GI: Progressive return of bowel function, soft brown stool in ostomy bag this morning -Diet advanced to clear liquids             -G tube remains clamped  -Zofran q6 for nausea  GU: Spontaneous voiding at bedside urinal, patient is able ambulate to the bathroom when free from monitors -LR at 100 mL/hr             -Trending electrolytes with repletion as needed   Heme/ID: Given rectal perforation and fecal peritonitis, patient is at risk for complications -Day 5 of Ertapenem post op for contamination, JP drainover rectal stump             -Dressing changes q shift due to saturation from drainage -Continues to be hemodynamically stable  -Monitoring patient closely for developing signs/symptoms of infection  Prophylaxis: SCDs, lovenox   Consults: PT, social work    LOS: 5 days     Elveria Risingimelie Horne, Medical Student 08/08/2019

## 2019-08-08 NOTE — Progress Notes (Signed)
Patient asked to ambulate. Patient requesting to rest a little longer. Patient stated "I would like to wait a hour or so." Educated on the importance of ambulation. Patient is in agreement and stated he will later today.

## 2019-08-09 LAB — CBC WITH DIFFERENTIAL/PLATELET
Abs Immature Granulocytes: 0.15 10*3/uL — ABNORMAL HIGH (ref 0.00–0.07)
Basophils Absolute: 0 10*3/uL (ref 0.0–0.1)
Basophils Relative: 0 %
Eosinophils Absolute: 0.2 10*3/uL (ref 0.0–0.5)
Eosinophils Relative: 2 %
HCT: 33.2 % — ABNORMAL LOW (ref 39.0–52.0)
Hemoglobin: 10.8 g/dL — ABNORMAL LOW (ref 13.0–17.0)
Immature Granulocytes: 1 %
Lymphocytes Relative: 20 %
Lymphs Abs: 2.1 10*3/uL (ref 0.7–4.0)
MCH: 28.3 pg (ref 26.0–34.0)
MCHC: 32.5 g/dL (ref 30.0–36.0)
MCV: 87.1 fL (ref 80.0–100.0)
Monocytes Absolute: 0.7 10*3/uL (ref 0.1–1.0)
Monocytes Relative: 7 %
Neutro Abs: 7.2 10*3/uL (ref 1.7–7.7)
Neutrophils Relative %: 70 %
Platelets: 320 10*3/uL (ref 150–400)
RBC: 3.81 MIL/uL — ABNORMAL LOW (ref 4.22–5.81)
RDW: 13.2 % (ref 11.5–15.5)
WBC: 10.4 10*3/uL (ref 4.0–10.5)
nRBC: 0 % (ref 0.0–0.2)

## 2019-08-09 LAB — BASIC METABOLIC PANEL
Anion gap: 4 — ABNORMAL LOW (ref 5–15)
BUN: 11 mg/dL (ref 6–20)
CO2: 29 mmol/L (ref 22–32)
Calcium: 7.8 mg/dL — ABNORMAL LOW (ref 8.9–10.3)
Chloride: 103 mmol/L (ref 98–111)
Creatinine, Ser: 0.86 mg/dL (ref 0.61–1.24)
GFR calc Af Amer: >60 mL/min (ref >60)
GFR calc non Af Amer: >60 mL/min (ref >60)
Glucose, Bld: 102 mg/dL — ABNORMAL HIGH (ref 70–99)
Potassium: 4.4 mmol/L (ref 3.5–5.1)
Sodium: 136 mmol/L (ref 135–145)

## 2019-08-09 LAB — PHOSPHORUS: Phosphorus: 4.7 mg/dL — ABNORMAL HIGH (ref 2.5–4.6)

## 2019-08-09 LAB — MAGNESIUM: Magnesium: 2 mg/dL (ref 1.7–2.4)

## 2019-08-09 NOTE — Progress Notes (Signed)
Contacted Sarah with [facility] at [phone] about home health,  They do not accept [employer] insurance.

## 2019-08-09 NOTE — Progress Notes (Signed)
Physical Therapy Treatment Patient Details Name: [name] [name] MRN: [id number] DOB: [birth year] Today's Date: 08/09/2019    History of Present Illness [name] [name] is a 53 yo male, s/p  Low anterior resection of sigmoid colon and rectum, end colostomy, gastrostomy tube placement, JP drain placement, removal of foreign body 08/03/19, with chronic pain, substance abuse who has told people he did cocaine a few days ago who presented to the ED with abdominal pain, nausea/vomiting, no BMs after inserting a vibrator in his rectum 2 days prior. He says that he has not had a BM and that he has been not able to eat or drink much since that time.  He has never had abdominal surgery. He reports that he is on chronic pain medication and follows with a PCP at [facility] on [address].    PT Comments    Patient tolerated therapy well. Modified independent in all transfers, mobility and exercise program. Reviewed home exercise program with patient and provided him with written explanation of exercises performed below. Patient has met all physical therapy goals at this time and is discharged from physical therapy to care of nursing for ambulation daily as tolerated for length of stay.    Follow Up Recommendations  No PT follow up;Supervision - Intermittent     Equipment Recommendations  None recommended by PT    Recommendations for Other Services       Precautions / Restrictions      Mobility  Bed Mobility Overal bed mobility: Modified Independent             General bed mobility comments: increased time, slightly labored movement  Transfers Overall transfer level: Modified independent Equipment used: None Transfers: Sit to/from [facility] Sit to Stand: Modified independent (Device/Increase time) Stand pivot transfers: Modified independent (Device/Increase time)       General transfer comment: slower transition  Ambulation/Gait Ambulation/Gait assistance: Supervision;Modified  independent (Device/Increase time) Gait Distance (Feet): 200 Feet Assistive device: None Gait Pattern/deviations: Decreased step length - right;Decreased step length - left;Decreased stride length Gait velocity: decreased   General Gait Details: slightly labored cadence without loss of balance on level, inclined and declined surfaces   Stairs             Wheelchair Mobility    Modified Rankin (Stroke Patients Only)       Balance Overall balance assessment: Mild deficits observed, not formally tested                                          Cognition Arousal/Alertness: Awake/alert   Overall Cognitive Status: Within Functional Limits for tasks assessed                                        Exercises General Exercises - Lower Extremity Long Arc Quad: AROM;Strengthening;Seated;20 reps;Both Hip Flexion/Marching: AROM;Strengthening;Seated;Both;20 reps Toe Raises: AROM;Strengthening;Seated;20 reps;Both Heel Raises: AROM;Strengthening;Seated;Both;20 reps    General Comments        Pertinent Vitals/Pain Pain Assessment: Faces Faces Pain Scale: Hurts little more Pain Location: right knee Pain Descriptors / Indicators: Aching    Home Living                      Prior Function  PT Goals (current goals can now be found in the care plan section) Acute Rehab PT Goals Patient Stated Goal: return home PT Goal Formulation: With patient Time For Goal Achievement: 08/12/19 Potential to Achieve Goals: Good Progress towards PT goals: Goals met/education completed, patient discharged from PT    Frequency    Min 4X/week      PT Plan Current plan remains appropriate    Co-evaluation              AM-PAC PT "6 Clicks" Mobility   Outcome Measure  Help needed turning from your back to your side while in a flat bed without using bedrails?: None Help needed moving from lying on your back to sitting on the  side of a flat bed without using bedrails?: None Help needed moving to and from a bed to a chair (including a wheelchair)?: None Help needed standing up from a chair using your arms (e.g., wheelchair or bedside chair)?: None Help needed to walk in hospital room?: None Help needed climbing 3-5 steps with a railing? : A Little 6 Click Score: 23    End of Session   Activity Tolerance: Patient tolerated treatment well;Patient limited by fatigue Patient left: in chair;with call bell/phone within reach Nurse Communication: Mobility status;Other (comment)(dc from PT) PT Visit Diagnosis: Unsteadiness on feet (R26.81);Other abnormalities of gait and mobility (R26.89);Muscle weakness (generalized) (M62.81)     Time: 5537-4827 PT Time Calculation (min) (ACUTE ONLY): 23 min  Charges:  $Gait Training: 8-22 mins $Therapeutic Exercise: 8-22 mins                     Jerene Pitch, DPT Physical Therapy with [facility]  [phone] office 08/09/2019, 3:33 PM

## 2019-08-09 NOTE — Progress Notes (Signed)
[facility] Progress Note  6 Days Post-Op  Subjective: Patient doing great. Seems to have good spirits. Has learned about his ostomy. Feeling good about taking care of it. Would like to have home health RN and I agree this is vital for all new ostomy patients.   Objective: Vital signs in last 24 hours: Temp:  [98.3 F (36.8 C)-99 F (37.2 C)] 98.3 F (36.8 C) (08/13 1603) Pulse Rate:  [83-108] 108 (08/13 1603) Resp:  [20] 20 (08/13 1603) BP: (102-118)/(68-76) 118/76 (08/13 1603) SpO2:  [96 %-98 %] 98 % (08/13 1603) Weight:  [105.7 kg] 105.7 kg (08/13 0533) Last BM Date: 08/07/19  Intake/Output from previous day: 08/12 0701 - 08/13 0700 In: 4914.3 [P.O.:3960; I.V.:954.3] Out: 2157.5 [Urine:2000; Drains:7.5; Stool:150] Intake/Output this shift: No intake/output data recorded.  General appearance: alert, cooperative and no distress Resp: normal work of breathing GI: soft, nondistended, appropriately tender, ostomy pink and stool in bag, midline loose staples with some serous drainage, no erythema JP with SS fluid, G tube in place and capped   Lab Results:  Recent Labs    08/07/19 0504 08/09/19 0426  WBC 10.1 10.4  HGB 11.5* 10.8*  HCT 34.7* 33.2*  PLT 265 320   BMET Recent Labs    08/08/19 0430 08/09/19 0426  NA 135 136  K 3.9 4.4  CL 101 103  CO2 25 29  GLUCOSE 96 102*  BUN 12 11  CREATININE 0.66 0.86  CALCIUM 7.8* 7.8*    Anti-infectives: Anti-infectives (From admission, onward)   Start     Dose/Rate Route Frequency Ordered Stop   08/04/19 1000  ertapenem (INVANZ) 1,000 mg in sodium chloride 0.9 % 100 mL IVPB     1 g 200 mL/hr over 30 Minutes Intravenous Every 24 hours 08/03/19 2218 08/08/19 1524   08/04/19 0600  ertapenem (INVANZ) 1,000 mg in sodium chloride 0.9 % 100 mL IVPB     1 g 200 mL/hr over 30 Minutes Intravenous On call to O.R. 08/03/19 1608 08/03/19 1635      Assessment/Plan: [name] [name] is a 53 yo POD 6 s/p Ex lap,  Hartman's for rectal perforation and gastrostomy tube for decompression. Doing great.  PRN for pain Home meds IS and OOB HD good Soft diet Learning about his ostomy and doing great JP out before d.c home, overlying stump and minimal output  No leukocytosis, completed post op antibiotics  Off fluids Needs Home Health RN, Case Managers working hard to find, if I need to reach out and speak and advocate for the patient I will do so as he will need support at home to prevent Ed visits and readmissions    LOS: 6 days    [name] [name] 08/09/2019

## 2019-08-10 MED ORDER — ZOLPIDEM TARTRATE 5 MG PO TABS
5.0000 mg | ORAL_TABLET | Freq: Once | ORAL | Status: AC
  Administered 2019-08-10: 22:00:00 5 mg via ORAL
  Filled 2019-08-10: qty 1

## 2019-08-10 NOTE — TOC Progression Note (Signed)
Transition of Care [facility]) - Progression Note    Patient Details  Name: EON ZUNKER MRN: [id number] Date of Birth: August 17, 1966  Transition of Care [facility]) CM/SW [place], [place] Phone Number: 08/10/2019, 1:29 PM  Clinical Narrative:   Orma Render at [facility].  They are "out of network."    Expected Discharge Plan: [place] Barriers to Discharge: No Barriers Identified  Expected Discharge Plan and Services Expected Discharge Plan: [place]   Discharge Planning Services: CM Consult Post Acute Care Choice: [place] arrangements for the past 2 months: Apartment Expected Discharge Date: 08/06/19                         [facility] Arranged: RN           Social Determinants of Health (SDOH) Interventions    Readmission Risk Interventions Readmission Risk Prevention Plan 08/06/2019  Post Dischage Appt Complete  Medication Screening Complete  Transportation Screening Complete  Some recent data might be hidden

## 2019-08-10 NOTE — Progress Notes (Signed)
[facility] Progress Note  7 Days Post-Op  Subjective: Feeling good. No complaints. Eating and having bowel function. He has changed his bag. He is nearing time to leave but needs to have home health support to help transition care.   Objective: Vital signs in last 24 hours: Temp:  [98.3 F (36.8 C)-98.9 F (37.2 C)] 98.9 F (37.2 C) (08/14 1508) Pulse Rate:  [88-99] 90 (08/14 1508) Resp:  [17-20] 18 (08/14 1508) BP: (110-138)/(75-94) 119/76 (08/14 1508) SpO2:  [97 %-99 %] 97 % (08/14 1508) Last BM Date: 08/10/19  Intake/Output from previous day: 08/13 0701 - 08/14 0700 In: 1320 [P.O.:1320] Out: 1260 [Urine:1250; Drains:10] Intake/Output this shift: No intake/output data recorded.  General appearance: alert, cooperative and no distress Resp: normal work of breathing GI: soft, nondistended, midline with clean dressing, ostomy pink with stool in bag, JP with ss output  Lab Results:  Recent Labs    08/09/19 0426  WBC 10.4  HGB 10.8*  HCT 33.2*  PLT 320   BMET Recent Labs    08/08/19 0430 08/09/19 0426  NA 135 136  K 3.9 4.4  CL 101 103  CO2 25 29  GLUCOSE 96 102*  BUN 12 11  CREATININE 0.66 0.86  CALCIUM 7.8* 7.8*     Assessment/Plan: Mr. Binford is a 53 yo POD 7 s/p Ex lap, Hartman's for rectal perforation and gastrostomy tube for decompression.   PRN for pain Home meds IS and OOB HD good Soft diet Ostomy care JP d/c tomorrow  Awaiting arrangement of home health. I have actively been working with case managers, and called [facility] and spoke to [place] regarding the patient and need for home health. She said that [facility] could accept him later next week.  Will hopefully get him home tomorrow with enough supplies to get him to a late week appt with Home Health.    LOS: 7 days    Virl Cagey 08/10/2019

## 2019-08-10 NOTE — Care Management (Signed)
Transitions of Care dept has been unable to secure [facility] services for pt after DC. HTA has been involved and would be supportive of short SNF stay to support transition with new ostomy. This TOC supervisor has reached out to weekend coverage to begin bed search. This TOC supervisor will call pt in room to discuss plan.

## 2019-08-11 MED ORDER — DOCUSATE SODIUM 100 MG PO CAPS: 100.0000 mg | ORAL_CAPSULE | Freq: Two times a day (BID) | ORAL | 2 refills | Status: DC | PRN

## 2019-08-11 MED ORDER — ONDANSETRON 4 MG PO TBDP: 4.0000 mg | ORAL_TABLET | Freq: Four times a day (QID) | ORAL | 0 refills | Status: DC | PRN

## 2019-08-11 NOTE — Discharge Summary (Signed)
Physician Discharge Summary  Patient ID: [name] [name] MRN: [id number] DOB/AGE: 06/26/1966 53 y.o.  Admit date: 08/03/2019 Discharge date: 08/11/2019  Admission Diagnoses: Rectal foreign body with perforation   Discharge Diagnoses:  Principal Problem:   Foreign body of rectum Active Problems:   Complete intestinal obstruction (HCC)   Perforated rectum (HCC)   Nasal obstruction   Fecal peritonitis ([name] [name])   Ileus, postoperative [name] [name])   Discharged Condition: good  Hospital Course: [name] [name] is a 53 yo who presented with a vibrator in his rectum that had been in there for a few days. Dr. Gala [name] attempted colonoscopic removal, but the patient had a lot of pain and did not tolerate this approach. Based on my exam, I thought there was a high likelihood of him having already perforated, and he went back for an emergent  Ex lap, low anterior resection of sigmoid colon and rectum, end colostomy, gastrostomy tube placement, JP drain placement, removal of foreign body. The gastrostomy tube had to be placed due to concern for ileus and the patient had no access for NG. We attempted NG for over 45 minute in the OR with no success (see operative note). Post op he did well. He did not get septic. He had good pain control. He had an ileus that slowly started to resolve, and he had his diet advanced from clears to soft diet.  He had good pain control with oral meds. He was ambulating, and had worked with the ostomy RN on ostomy care.  He expressed understanding on caring for this wound.  He had a JP drain in the pelvis overlying the rectal stump that was putting out minimal SS drainage and this was removed.  Prior to discharge he had normal labs, no fevers, and was doing well. His midline was loosely stapled without erythema and some minor drainage.  Due to the patient being on a pain contract, I did not prescribe any additional pain medication.  He was ambulating, voiding without issue after getting his foley  removed post op, and had no complaints.  His G tube will remain in place for 6-8 weeks and it can be pulled in the office. The midline staple will be removed after about 3 weeks due to the loose nature.  We will discuss plan for reversal in the fall, will need colonoscopy and barium enema. May ultimately have to get referred to colorectal if we are looking at a low anastomosis as I do not want to prevent him from getting continuity.   Consults: GI- attempted colonoscopy prior to be getting involved  Significant Diagnostic Studies:  Xray abd- vibrator overlying pelvis   Treatments: Surgery 8/7- Ex lap, Low anterior resection of sigmoid colon and rectum, end colostomy, gastrostomy tube placement, JP drain placement, removal of foreign body    Discharge Exam: Blood pressure 134/89, pulse 96, temperature 98.5 F (36.9 C), temperature source Oral, resp. rate 18, height 5\' 11"  (1.803 m), weight 105.7 kg, SpO2 97 %. General appearance: alert, cooperative and no distress Resp: normal work of breathing GI: soft, appropriately tender, ostomy pink with stool, midline with staples loosely placed, some expected drainage, no erythema Extremities: extremities normal, atraumatic, no cyanosis or edema  Disposition: Discharge disposition: 01-Home or Self Care       Discharge Instructions    Call [name] for:  difficulty breathing, headache or visual disturbances   Complete by: As directed    Call [name] for:  persistant dizziness or light-headedness   Complete by: As  directed    Call [name] for:  persistant nausea and vomiting   Complete by: As directed    Call [name] for:  redness, tenderness, or signs of infection (pain, swelling, redness, odor or green/yellow discharge around incision site)   Complete by: As directed    Call [name] for:  severe uncontrolled pain   Complete by: As directed    Call [name] for:  temperature >100.4   Complete by: As directed    Increase activity slowly   Complete by: As directed       Allergies as of 08/11/2019      Reactions   Demerol [meperidine]    Levaquin [levofloxacin In D5w]    Morphine And Related    Other    arythromycin   Penicillins    REACTION: Rash and facial swelling at age 53      Medication List    TAKE these medications   amitriptyline 50 MG tablet Commonly known as: ELAVIL Take 150-200 mg by mouth at bedtime.   ARIPiprazole 5 MG tablet Commonly known as: ABILIFY Take 5 mg by mouth at bedtime.   clonazePAM 2 MG tablet Commonly known as: KLONOPIN Take 2 mg by mouth 3 (three) times daily as needed for anxiety.   docusate sodium 100 MG capsule Commonly known as: Colace Take 1 capsule (100 mg total) by mouth 2 (two) times daily as needed for mild constipation.   DULoxetine 60 MG capsule Commonly known as: CYMBALTA Take 1 capsule by mouth 2 (two) times daily.   gabapentin 400 MG capsule Commonly known as: NEURONTIN Take 800 mg by mouth 2 (two) times daily. And an additional 800 mg as needed.   HYDROcodone-acetaminophen 7.5-325 MG tablet Commonly known as: NORCO Take 1 tablet by mouth every 6 (six) hours as needed for moderate pain.   ondansetron 4 MG disintegrating tablet Commonly known as: ZOFRAN-ODT Take 1 tablet (4 mg total) by mouth every 6 (six) hours as needed for nausea.   pravastatin 10 MG tablet Commonly known as: PRAVACHOL Take 10 mg by mouth daily.   traMADol 50 MG tablet Commonly known as: ULTRAM Take 100 mg by mouth every 6 (six) hours as needed.   zolpidem 10 MG tablet Commonly known as: AMBIEN Take 10 mg by mouth at bedtime.      Follow-up Information    [name] [name] Follow up on 08/22/2019.   Specialty: Family Medicine Why: Wednesday at 11:30 for your hospital follow up appointment.  Please call from the parking lot when you arrive for check-in, and make sure to wear a mask as well. Contact information: [address] [place] [address] [phone]        [name] [name], [name] Pimenta  C, [name] Follow up on 08/21/2019.   Specialty: General Surgery Why: Dr. Lovell SheehanJenkins will do staple removal on 8/25 WIll see Dr. [name] in 9/29 to discuss plans for reversal and check and remove the gastrostomy tube  Contact information: [address] [place] [facility] [place] [id number] [phone]        [facility].   Why: Someone should be calling you to set up an appt to come for a RN check.  Contact information: [address] [place] [id number] [phone]          Signed: Lucretia RoersLindsay C Lindsi Bayliss 08/11/2019, 3:21 PM

## 2019-08-11 NOTE — Discharge Instructions (Signed)
It is ok to shower with your colostomy.  You can cover your bag or you can shower with the bag then dry it or replace if needed.  Care for your ostomy as instructed by the RN. Ostomy is an Psychologist, educational, and you will figure out what works for you. Keep Gtube (feeding tube) capped off and taped down so it is not accidentally pulled out.   Home Health Is Being Arranged with [facility]. They will not be available to help until Wednesday/ Thursday (8/18-8/19). Please change your bag and appliance as instructed between now and the first visit. They will want you to have a family member or care giver available to also learn how to troubleshoot your ostomy (having someone else to help is a must for the home health team).   If you do not hear about a Home Health RN coming to see you, please call Dr. Henreitta Leber office to see if we can help figure out what happened.    Common Complaints: Pain at the incision site is common. This will improve with time. Take your pain medications as described below. Some nausea is common and poor appetite. The main goal is to stay hydrated the first few days after surgery.   Diet/ Activity: Diet as tolerated. You have started and tolerated a diet in the hospital, and should continue to increase what you are able to eat.   You may not have a large appetite, but it is important to stay hydrated. Drink 64 ounces of water a day. Your appetite will return with time.  Keep a dry dressing in place over your staples daily or as needed.  Some minor pink/ blood tinged drainage is expected.  Shower per your regular routine daily.  Pat your incision dry.   Rest and listen to your body, but do not remain in bed all day.  Walk everyday for at least 15-20 minutes. Deep cough and move around every 1-2 hours in the first few days after surgery.  Do not lift > 10 lbs, perform excessive bending, pushing, pulling, squatting for 6-8 weeks after surgery.  The activity restrictions are to  prevent hernia formation at your incision while you are healing.  Do not place lotions or balms on your incision unless instructed to specifically by Dr. Henreitta Leber.   Medication: Take tylenol and ibuprofen as needed for pain control, alternating every 4-6 hours.  Example:  Tylenol 1000mg  @ 6am, 12noon, 6pm, (Do not exceed 4000mg  of tylenol a day). Ibuprofen 800mg  @ 9am, 3pm, 9pm, 3am (Do not exceed 3600mg  of ibuprofen a day).  Take you home pain medication as prescribed for pain. Due to your pain contract I cannot prescribe more pain medication. .  Take Colace for constipation related to narcotic pain medication. If you do not have a bowel movement in 2 days, take Miralax over the counter.  Drink plenty of water to also prevent constipation.   Contact Information: If you have questions or concerns, please call our office, [phone], Monday- Thursday 8AM-5PM and Friday 8AM-12Noon.  If it is after hours or on the weekend, please call [facility], [phone], and ask to speak to the surgeon on call for Dr. Henreitta Leber at [facility].   Colostomy Home Guide, Adult  Colostomy surgery is done to create an opening in the front of the abdomen for stool (feces) to leave the body through an ostomy (stoma). Part of the large intestine is attached to the stoma. A bag, also called a  pouch, is fitted over the stoma. Stool and gas will collect in the bag. After surgery, you will need to empty and change your colostomy bag as needed. You will also need to care for your stoma. How to care for the stoma Your stoma should look pink, red, and moist, like the inside of your cheek. Soon after surgery, the stoma may be swollen, but this swelling will go away within 6 weeks. To care for the stoma:  Keep the skin around the stoma clean and dry.  Use a clean, soft washcloth to gently wash the stoma and the skin around it. Clean using a circular motion, and wipe away from the stoma opening, not toward  it. ? Use warm water and only use cleansers recommended by your health care provider. ? Rinse the stoma area with plain water. ? Dry the area around the stoma well.  Use stoma powder or ointment on your skin only as told by your health care provider. Do not use any other powders, gels, wipes, or creams on the skin around the stoma.  Check the stoma area every day for signs of infection. Check for: ? New or worsening redness, swelling, or pain. ? New or increased fluid or blood. ? Pus or warmth.  Measure the stoma opening regularly and record the size. Watch for changes. (It is normal for the stoma to get smaller as swelling goes away.) Share this information with your health care provider. How to empty the colostomy bag  Empty your bag at bedtime and whenever it is one-third to one-half full. Do not let the bag get more than half-full with stool or gas. The bag could leak if it gets too full. Some colostomy bags have a built-in gas release valve that releases gas often throughout the day. Follow these basic steps: 1. Wash your hands with soap and water. 2. Sit far back on the toilet seat. 3. Put several pieces of toilet paper into the toilet water. This will prevent splashing as you empty stool into the toilet. 4. Remove the clip or the hook-and-loop fastener from the tail end of the bag. 5. Unroll the tail, then empty the stool into the toilet. 6. Clean the tail with toilet paper or a moist towelette. 7. Reroll the tail, and close it with the clip or the hook-and-loop fastener. 8. Wash your hands again. How to change the colostomy bag Change your bag every 3-4 days or as often as told by your health care provider. Also change the bag if it is leaking or separating from the skin, or if your skin around the stoma looks or feels irritated. Irritated skin may be a sign that the bag is leaking. Always have colostomy supplies with you, and follow these basic steps: 1. Wash your hands with soap  and water. Have paper towels or tissues nearby to clean any discharge. 2. Remove the old bag and skin barrier. Use your fingers or a warm cloth to gently push the skin away from the barrier. 3. Clean the stoma area with water or with mild soap and water, as directed. Use water to rinse away any soap. 4. Dry the skin. You may use the cool setting on a hair dryer to do this. 5. Use a tracing pattern (template) to cut the skin barrier to the size needed. 6. If you are using a two-piece bag, attach the bag and the skin barrier to each other. Add the barrier ring, if you use one. 7. If directed,  apply stoma powder or skin barrier gel to the skin. 8. Warm the skin barrier with your hands, or blow with a hair dryer for 5-10 seconds. 9. Remove the paper from the adhesive strip of the skin barrier. 10. Press the adhesive strip onto the skin around the stoma. 11. Gently rub the skin barrier onto the skin. This creates heat that helps the barrier to stick. 12. Apply stoma tape to the edges of the skin barrier, if desired. 13. Wash your hands again. General recommendations  Avoid wearing tight clothes or having anything press directly on your stoma or bag. Change your clothing whenever it is soiled or damp.  You may shower or bathe with the bag on or off. Do not use harsh or oily soaps or lotions. Dry the skin and bag after bathing.  Store all supplies in a cool, dry place. Do not leave supplies in extreme heat because some parts can melt or not stick as well.  Whenever you leave home, take extra clothing and an extra skin barrier and bag with you.  If your bag gets wet, you can dry it with a hair dryer on the cool setting.  To prevent odor, you may put drops of ostomy deodorizer in the bag.  If recommended by your health care provider, put ostomy lubricant inside the bag. This helps stool to slide out of the bag more easily and completely. Contact a health care provider if:  You have new or  worsening redness, swelling, or pain around your stoma.  You have new or increased fluid or blood coming from your stoma.  Your stoma feels warm to the touch.  You have pus coming from your stoma.  Your stoma extends in or out farther than normal.  You need to change your bag every day.  You have a fever. Get help right away if:  Your stool is bloody.  You have nausea or you vomit.  You have trouble breathing. Summary  Measure your stoma opening regularly and record the size. Watch for changes.  Empty your bag at bedtime and whenever it is one-third to one-half full. Do not let the bag get more than half-full with stool or gas.  Change your bag every 3-4 days or as often as told by your health care provider.  Whenever you leave home, take extra clothing and an extra skin barrier and bag with you. This information is not intended to replace advice given to you by your health care provider. Make sure you discuss any questions you have with your health care provider. Document Released: 12/16/2003 Document Revised: 04/04/2019 Document Reviewed: 06/08/2017 Elsevier Patient Education  2020 Elsevier Inc[employer]   Colostomy Surgery, Adult, Care After  This sheet gives you information about how to care for yourself after your procedure. Your health care provider may also give you more specific instructions. If you have problems or questions, contact your health care provider. What can I expect after the procedure? After the procedure, it is common to have:  Swelling at the opening that was created during the procedure (stoma).  Slight bleeding around the stoma.  Redness around the stoma. Follow these instructions at home: Activity  Rest as needed while the stoma area heals.  Return to your normal activities as told by your health care provider. Ask your health care provider what activities are safe for you.  Avoid strenuous activity and abdominal exercises for 3 weeks or for as  long as told by your health care provider.  Do not lift anything that is heavier than 10 lb (4.5 kg), or the limit that you are told, until your health care provider says that it is safe. Incision care  Follow instructions from your health care provider about how to take care of your incision. Make sure you: ? Wash your hands with soap and water before you change your bandage (dressing). If soap and water are not available, use hand sanitizer. ? Change your dressing as told by your health care provider. ? Leave stitches (sutures), skin glue, or adhesive strips in place. These skin closures may need to stay in place for 2 weeks or longer. If adhesive strip edges start to loosen and curl up, you may trim the loose edges. Do not remove adhesive strips completely unless your health care provider tells you to do that. Stoma care  Keep the stoma area clean.  Clean and dry the skin around the stoma each time you change the colostomy bag. To clean the stoma area: ? Use warm water and only use cleansers that are recommended by your health care provider. ? Rinse the stoma area with plain water. ? Dry the area well.  Use stoma powder or skin barrier film on your skin only as told by your health care provider. Do not use any other powders, gels, wipes, or creams on the skin around the stoma.  Check the stoma area every day for signs of infection. Check for: ? More redness, swelling, or pain. ? More fluid or blood. ? Pus or warmth.  Measure the stoma opening regularly and record the size. Watch for changes. Share this information with your health care provider. Bathing  Do not take baths, swim, or use a hot tub until your health care provider approves.   You may shower.   You may be able to shower with or without the colostomy bag in place. If you bathe with the bag on, dry the bag afterward.  Avoid using harsh or oily soaps when you bathe. Colostomy bag care  Follow instructions from your  health care provider about how to empty or change the colostomy bag.  Keep colostomy supplies with you at all times.  Store all supplies in a cool, dry place.  Empty the colostomy bag: ? Whenever it is one-third to one-half full. ? At bedtime.  Replace the bag every 3-4 days for the first 6 weeks, then every 4-7 days. Driving  Follow driving restrictions as told by your health care provider.  Do not drive or use heavy machinery while taking prescription pain medicine. General instructions  Follow instructions from your health care provider about eating or drinking restrictions.  Take over-the-counter and prescription medicines only as told by your health care provider.  Avoid wearing clothes that are tight directly over your stoma area.  Do not use any products that contain nicotine or tobacco, such as cigarettes and e-cigarettes. If you need help quitting, ask your health care provider.  If you are a woman, ask your health care provider about becoming pregnant and about using birth control. Medicines may not be absorbed normally after the procedure.  Keep all follow-up visits as told by your health care provider. This is important. Contact a health care provider if you have:  Trouble caring for your stoma or changing the colostomy bag.  Nausea or vomiting.  A fever.  More redness, swelling, or pain at the site of your stoma or around your anus.  More fluid or blood coming from your  stoma or your anus.  Warmth around your stoma area.  Pus coming from your stoma.  A change in the size or appearance of the stoma.  Abdominal pain, bloating, pressure, or cramping.  Stool more often or less often than your health care provider tells you to expect.  Very little urine production. This may be a sign of dehydration. Get help right away if you have:  Abdominal pain that does not go away or becomes severe.  Frequent vomiting.  No stool draining through the  stoma.  Chest pain or an irregular heartbeat. Summary  Follow instructions from your health care provider about how to take care of your incision and stoma.  Contact a health care provider if you have trouble caring for your stoma or changing the colostomy bag.  Get help right away if you have abdominal pain that does not go away or becomes severe or if you have no stool draining through the stoma.  Keep all follow-up visits as told by your health care provider. This is important. This information is not intended to replace advice given to you by your health care provider. Make sure you discuss any questions you have with your health care provider. Document Released: 05/05/2011 Document Revised: 04/11/2018 Document Reviewed: 04/11/2018 Elsevier Patient Education  [place].

## 2019-08-11 NOTE — TOC Transition Note (Addendum)
Transition of Care [facility]) - CM/SW Discharge Note   Patient Details  Name: [name] [name] MRN: [id number] Date of Birth: [birth year]  Transition of Care [facility]) CM/SW Contact:  Latanya Maudlin, RN Phone Number: 08/11/2019, 1:57 PM   Clinical Narrative:  Patient to be discharged per MD order. Orders in place for home health services. Patient ultimately accepted by [facility]. Notified Corene Cornea of discharge. No DME needs. Family to provide transport. Bedside RN to provide osotmy supplies      Final next level of care: Home w Home Health Services Barriers to Discharge: No Barriers Identified   Patient Goals and CMS Choice Patient states their goals for this hospitalization and ongoing recovery are:: "My insurance told me I'm eligible for a visit 3 times a week.  That sounds good to me.  Can you set it up for me?" CMS Medicare.gov Compare Post Acute Care list provided to:: Patient Choice offered to / list presented to : Patient  Discharge Placement                       Discharge Plan and Services   Discharge Planning Services: CM Consult Post Acute Care Choice: Home Health                    HH Arranged: RN, PT [facility] Agency: [place] [facility] Date [place]: 08/11/19 Time [place]: 9458 Representative spoke with at [place]: [place] ([place]) Interventions     Readmission Risk Interventions Readmission Risk Prevention Plan 08/06/2019  Post Dischage Appt Complete  Medication Screening Complete  Transportation Screening Complete  Some recent data might be hidden

## 2019-08-11 NOTE — Progress Notes (Signed)
IVs removed, 2x2 gauze and paper tape applied to sites, patient tolerated well.  Reviewed AVS with patient who verbalized understanding.  Patient given dressing and ostomy supplies to take home.  Patient taken to lobby via wheelchair and transported home by his mother and father.

## 2019-08-14 ENCOUNTER — Encounter (HOSPITAL_COMMUNITY): Payer: Self-pay | Admitting: Internal Medicine

## 2019-08-14 DIAGNOSIS — Z433 Encounter for attention to colostomy: Secondary | ICD-10-CM | POA: Diagnosis not present

## 2019-08-14 DIAGNOSIS — Z431 Encounter for attention to gastrostomy: Secondary | ICD-10-CM | POA: Diagnosis not present

## 2019-08-14 DIAGNOSIS — Z4803 Encounter for change or removal of drains: Secondary | ICD-10-CM | POA: Diagnosis not present

## 2019-08-14 DIAGNOSIS — F419 Anxiety disorder, unspecified: Secondary | ICD-10-CM | POA: Diagnosis not present

## 2019-08-14 DIAGNOSIS — F17210 Nicotine dependence, cigarettes, uncomplicated: Secondary | ICD-10-CM | POA: Diagnosis not present

## 2019-08-14 DIAGNOSIS — T185XXD Foreign body in anus and rectum, subsequent encounter: Secondary | ICD-10-CM | POA: Diagnosis not present

## 2019-08-14 DIAGNOSIS — M797 Fibromyalgia: Secondary | ICD-10-CM | POA: Diagnosis not present

## 2019-08-21 ENCOUNTER — Encounter: Payer: Self-pay | Admitting: General Surgery

## 2019-08-21 ENCOUNTER — Ambulatory Visit (INDEPENDENT_AMBULATORY_CARE_PROVIDER_SITE_OTHER): Payer: Self-pay | Admitting: General Surgery

## 2019-08-21 ENCOUNTER — Other Ambulatory Visit: Payer: Self-pay

## 2019-08-21 VITALS — BP 119/77 | HR 97 | Temp 98.4°F | Resp 18 | Ht 71.0 in | Wt 217.0 lb

## 2019-08-21 DIAGNOSIS — Z09 Encounter for follow-up examination after completed treatment for conditions other than malignant neoplasm: Secondary | ICD-10-CM

## 2019-08-21 NOTE — Progress Notes (Signed)
Subjective:     [name] [name]  Here for postoperative visit to remove staples.  Patient has no complaints. Objective:    BP 119/77 (BP Location: Left Arm, Patient Position: Sitting, Cuff Size: Normal)   Pulse 97   Temp 98.4 F (36.9 C) (Temporal)   Resp 18   Ht 5\' 11"  (1.803 m)   Wt 217 lb (98.4 kg)   SpO2 96%   BMI 30.27 kg/m   General:  alert, cooperative and no distress  Abdomen soft, incision healing well by secondary intention.  The wound has almost completely healed.  No purulent drainage present.  Remaining staples removed.  Colostomy pink and patent.     Assessment:    Doing well postoperatively.    Plan:   Patient to follow-up with Dr. Constance Haw in 1 month.

## 2019-08-22 ENCOUNTER — Other Ambulatory Visit: Payer: Self-pay

## 2019-08-22 DIAGNOSIS — Z09 Encounter for follow-up examination after completed treatment for conditions other than malignant neoplasm: Secondary | ICD-10-CM | POA: Diagnosis not present

## 2019-08-22 DIAGNOSIS — K659 Peritonitis, unspecified: Secondary | ICD-10-CM | POA: Diagnosis not present

## 2019-08-22 DIAGNOSIS — T185XXA Foreign body in anus and rectum, initial encounter: Secondary | ICD-10-CM | POA: Diagnosis not present

## 2019-08-27 DIAGNOSIS — Z933 Colostomy status: Secondary | ICD-10-CM | POA: Diagnosis not present

## 2019-09-12 ENCOUNTER — Other Ambulatory Visit: Payer: Self-pay

## 2019-09-12 ENCOUNTER — Telehealth: Payer: Self-pay

## 2019-09-12 NOTE — Telephone Encounter (Signed)
Patient called and lvm regarding pain medication refill. MD notified.

## 2019-09-14 DIAGNOSIS — Z933 Colostomy status: Secondary | ICD-10-CM | POA: Diagnosis not present

## 2019-09-15 ENCOUNTER — Telehealth: Payer: Self-pay | Admitting: General Surgery

## 2019-09-15 NOTE — Telephone Encounter (Signed)
[facility]  Patient called regarding reordering pain medication.  He is in a pain contract and refilled his Norco 9/16. I will not be able to refill the pain medication.  Curlene Labrum, MD [facility] [address] [place], [place] [phone] (office)

## 2019-09-20 ENCOUNTER — Emergency Department (HOSPITAL_COMMUNITY)
Admission: EM | Admit: 2019-09-20 | Discharge: 2019-09-20 | Discharge status: Against Medical Advice | Payer: PPO | Attending: Emergency Medicine | Admitting: Emergency Medicine

## 2019-09-20 ENCOUNTER — Emergency Department (HOSPITAL_COMMUNITY): Payer: PPO

## 2019-09-20 ENCOUNTER — Other Ambulatory Visit: Payer: Self-pay

## 2019-09-20 ENCOUNTER — Encounter (HOSPITAL_COMMUNITY): Payer: Self-pay | Admitting: Emergency Medicine

## 2019-09-20 DIAGNOSIS — Z5320 Procedure and treatment not carried out because of patient's decision for unspecified reasons: Secondary | ICD-10-CM | POA: Insufficient documentation

## 2019-09-20 DIAGNOSIS — F1410 Cocaine abuse, uncomplicated: Secondary | ICD-10-CM | POA: Diagnosis not present

## 2019-09-20 DIAGNOSIS — R4182 Altered mental status, unspecified: Secondary | ICD-10-CM | POA: Diagnosis not present

## 2019-09-20 HISTORY — DX: Bipolar disorder, unspecified: F31.9

## 2019-09-20 LAB — URINALYSIS, ROUTINE W REFLEX MICROSCOPIC
Bilirubin Urine: NEGATIVE
Glucose, UA: NEGATIVE mg/dL
Hgb urine dipstick: NEGATIVE
Ketones, ur: NEGATIVE mg/dL
Leukocytes,Ua: NEGATIVE
Nitrite: NEGATIVE
Protein, ur: NEGATIVE mg/dL
Specific Gravity, Urine: 1.004 — ABNORMAL LOW (ref 1.005–1.030)
pH: 7 (ref 5.0–8.0)

## 2019-09-20 LAB — CBC
HCT: 38.1 % — ABNORMAL LOW (ref 39.0–52.0)
Hemoglobin: 12.8 g/dL — ABNORMAL LOW (ref 13.0–17.0)
MCH: 28.7 pg (ref 26.0–34.0)
MCHC: 33.6 g/dL (ref 30.0–36.0)
MCV: 85.4 fL (ref 80.0–100.0)
Platelets: 262 10*3/uL (ref 150–400)
RBC: 4.46 MIL/uL (ref 4.22–5.81)
RDW: 13.3 % (ref 11.5–15.5)
WBC: 10 10*3/uL (ref 4.0–10.5)
nRBC: 0 % (ref 0.0–0.2)

## 2019-09-20 LAB — COMPREHENSIVE METABOLIC PANEL
ALT: 14 U/L (ref 0–44)
AST: 14 U/L — ABNORMAL LOW (ref 15–41)
Albumin: 3.8 g/dL (ref 3.5–5.0)
Alkaline Phosphatase: 80 U/L (ref 38–126)
Anion gap: 8 (ref 5–15)
BUN: 6 mg/dL (ref 6–20)
CO2: 23 mmol/L (ref 22–32)
Calcium: 8.6 mg/dL — ABNORMAL LOW (ref 8.9–10.3)
Chloride: 101 mmol/L (ref 98–111)
Creatinine, Ser: 0.93 mg/dL (ref 0.61–1.24)
GFR calc Af Amer: >60 mL/min (ref >60)
GFR calc non Af Amer: >60 mL/min (ref >60)
Glucose, Bld: 137 mg/dL — ABNORMAL HIGH (ref 70–99)
Potassium: 3.3 mmol/L — ABNORMAL LOW (ref 3.5–5.1)
Sodium: 132 mmol/L — ABNORMAL LOW (ref 135–145)
Total Bilirubin: 0.8 mg/dL (ref 0.3–1.2)
Total Protein: 7.1 g/dL (ref 6.5–8.1)

## 2019-09-20 LAB — RAPID URINE DRUG SCREEN, HOSP PERFORMED
Amphetamines: NOT DETECTED
Barbiturates: NOT DETECTED
Benzodiazepines: NOT DETECTED
Cocaine: POSITIVE — AB
Opiates: NOT DETECTED
Tetrahydrocannabinol: NOT DETECTED

## 2019-09-20 LAB — LITHIUM LEVEL: Lithium Lvl: <0.06 mmol/L — ABNORMAL LOW (ref 0.60–1.20)

## 2019-09-20 LAB — CBG MONITORING, ED: Glucose-Capillary: 143 mg/dL — ABNORMAL HIGH (ref 70–99)

## 2019-09-20 LAB — ETHANOL: Alcohol, Ethyl (B): <10 mg/dL (ref <10)

## 2019-09-20 MED ORDER — SODIUM CHLORIDE 0.9% FLUSH: 3.0000 mL | Freq: Once | INTRAVENOUS | Status: DC

## 2019-09-20 NOTE — ED Provider Notes (Signed)
1530:  Pt received at change of shift with TTS consult pending. Pt was awake/alert, resps easy, sitting up, eating and talking on telephone. States to me that he didn't need to be here and that his mother was just mad at him. Denies SI/HI, does not appear psychotic. Awaiting TTS evaluation.   1630:  Pt not in his room, not found in ED. Pt eloped.     Francine Graven, DO 09/20/19 1634

## 2019-09-20 NOTE — ED Notes (Signed)
Pt not in room at this time. Pt had removed IV. Pt was not SI orHI. EDP notified

## 2019-09-20 NOTE — [facility] Counselor (Addendum)
TTS attempting to see patient. TTS informed by RN that patient left AMA.

## 2019-09-20 NOTE — ED Notes (Signed)
Pt refusing head CT. Dr Sabra Heck informed and states can DC CT, but wants TTS completed. Pt conts to say this is all because of his mother being mad at him. States he is in chronic pain all the time and that is why he needs medications for pain. Pt conts to ramble about home environment and his mother

## 2019-09-20 NOTE — ED Notes (Signed)
Pt reports he wants to sign out AMA. Dr Thurnell Garbe notified and went to speak to pt. Conts to want to leave. Informed he will be IVCd if he tries to leave.

## 2019-09-20 NOTE — ED Notes (Signed)
Pt reports that nothing is wrong with him, reports his mother is being smiteful with bringing him to the ED

## 2019-09-20 NOTE — ED Triage Notes (Signed)
Pt here with mother. Pt confused. Mother states last saw him Saturday but talked to him yesterday and seemed fine. Mother states was on bipolar meds and acted like this the last time he was off his meds. Mother states has 2 other people living with him and thinks they may have been taking his med. Pt mildy restless. Mother states pt not eating and hallucinating. Disoriented to day/month/reason why here.

## 2019-09-20 NOTE — ED Notes (Signed)
Removed vibrator from pts pocket

## 2019-09-20 NOTE — ED Provider Notes (Signed)
[facility] EMERGENCY DEPARTMENT Provider Note   CSN: [id number] Arrival date & time: 09/20/19  1053     History   Chief Complaint Chief Complaint  Patient presents with  . Altered Mental Status    HPI [name] [name] is a 53 y.o. male.     HPI  This patient is a 53 year old male, he has a known history of bipolar disorder, he also has a history of chronic pain and chronic fatigue.  The patient had been admitted to the hospital 1 month ago during which time he had inserted a vibrator into his rectum, it had become obstructed and failed endoscopic retrieval requiring exploratory laparotomy with a diverting ostomy after the foreign body was removed.  Since that time apparently the patient has been doing well, he had a follow-up visit on August 25, he comes in today at the request of his mother who brings him to the hospital stating that he has been altered.  He is not able to tell me exactly why he is here, he does not think he needs to be here, his mother reports that he is confused, she last saw him 3 days ago but talked to him yesterday and he seemed fine at that time.  The mother states that he is on bipolar medications and has acted like this in times past when he was off his medications.  The patient does report that he has taken his medications as early as this morning including 2 gabapentin tablets which are his usual.  He denies overdose, denies medication abstinence, denies abdominal pain nausea or vomiting.  The patient is slurring his words and appears mildly somnolent, level 5 caveat applies secondary to altered mental status.  Past Medical History:  Diagnosis Date  . Anxiety   . Bipolar 1 disorder (HCC)   . Chronic fatigue   . Chronic pain   . Fibromyalgia     Patient Active Problem List   Diagnosis Date Noted  . Perforated rectum (HCC) 08/03/2019  . Nasal obstruction 08/03/2019  . Fecal peritonitis (HCC) 08/03/2019  . Foreign body of rectum   . Complete intestinal  obstruction (HCC)   . Ileus, postoperative (HCC)   . Cellulitis 06/12/2017  . GASTROENTERITIS, ACUTE 01/22/2008  . FEVER UNSPECIFIED 11/10/2007  . DIZZINESS 11/06/2007  . HYPERLIPIDEMIA 04/26/2007  . DISORDER, BIPOLAR NOS 04/26/2007  . DEPRESSION 04/26/2007  . ALLERGIC RHINITIS 04/26/2007    Past Surgical History:  Procedure Laterality Date  . COLON RESECTION SIGMOID  08/03/2019   Procedure: COLON RESECTION SIGMOID;  Surgeon: Lucretia Roers, MD;  Location: [facility];  Service: General;;  . COLOSTOMY N/A 08/03/2019   Procedure: COLOSTOMY;  Surgeon: Lucretia Roers, MD;  Location: [facility];  Service: General;  Laterality: N/A;  . FLEXIBLE SIGMOIDOSCOPY N/A 08/03/2019   Procedure: FLEXIBLE SIGMOIDOSCOPY;  Surgeon: Corbin Ade, MD;  Location: [facility];  Service: Endoscopy;  Laterality: N/A;  . KNEE ARTHROSCOPY Left 2006   miniscus tear  . LAPAROTOMY N/A 08/03/2019   Procedure: EXPLORATORY LAPAROTOMY;  Surgeon: Lucretia Roers, MD;  Location: [facility] ORS;  Service: General;  Laterality: N/A;  . LYMPH GLAND EXCISION Left 1983  . TONSILLECTOMY          Home Medications    Prior to Admission medications   Medication Sig Start Date End Date Taking? Authorizing Provider  amitriptyline (ELAVIL) 50 MG tablet Take 150-200 mg by mouth at bedtime.    [provider]  ARIPiprazole (ABILIFY) 5  MG tablet Take 5 mg by mouth at bedtime.    [provider]  clonazePAM (KLONOPIN) 2 MG tablet Take 2 mg by mouth 3 (three) times daily as needed for anxiety.    [provider]  docusate sodium (COLACE) 100 MG capsule Take 1 capsule (100 mg total) by mouth 2 (two) times daily as needed for mild constipation. 08/11/19 08/10/20  Lucretia RoersBridges, Lindsay C, MD  DULoxetine (CYMBALTA) 60 MG capsule Take 1 capsule by mouth 2 (two) times daily. 07/30/19   [provider]  gabapentin (NEURONTIN) 400 MG capsule Take 800 mg by mouth 2 (two) times daily. And an additional 800 mg as needed.  10/30/14   [provider]  HYDROcodone-acetaminophen (NORCO) 7.5-325 MG tablet Take 1 tablet by mouth every 6 (six) hours as needed for moderate pain.     [provider]  meloxicam (MOBIC) 7.5 MG tablet Take 1 tablet by mouth 2 (two) times daily. 09/03/19   [provider]  ondansetron (ZOFRAN-ODT) 4 MG disintegrating tablet Take 1 tablet (4 mg total) by mouth every 6 (six) hours as needed for nausea. Patient not taking: Reported on 08/21/2019 08/11/19   Lucretia RoersBridges, Lindsay C, MD  oxyCODONE-acetaminophen (PERCOCET) 7.5-325 MG tablet  08/13/19   [provider]  pravastatin (PRAVACHOL) 10 MG tablet Take 10 mg by mouth daily.    [provider]  traMADol (ULTRAM) 50 MG tablet Take 100 mg by mouth every 6 (six) hours as needed.    [provider]  zolpidem (AMBIEN) 10 MG tablet Take 10 mg by mouth at bedtime. 10/30/14   [provider]    Family History Family History  Problem Relation Age of Onset  . Diabetes Mother   . Heart attack Father   . Diabetes Maternal Grandmother     Social History Social History   Tobacco Use  . Smoking status: Current Every Day Smoker    Packs/day: 0.50    Years: 10.00    Pack years: 5.00  . Smokeless tobacco: Never Used  Substance Use Topics  . Alcohol use: Yes    Comment: occasional  . Drug use: Yes    Types: Cocaine    Comment: "yesterday"     Allergies   Demerol [meperidine], Levaquin [levofloxacin in d5w], Morphine and related, Other, and Penicillins   Review of Systems Review of Systems  Unable to perform ROS: Mental status change     Physical Exam Updated Vital Signs BP 119/87   Pulse 90   Temp 98.1 F (36.7 C)   Resp 14   SpO2 95%   Physical Exam Vitals signs and nursing note reviewed.  Constitutional:      General: He is not in acute distress.    Appearance: He is well-developed.  HENT:     Head: Normocephalic and atraumatic.     Comments: Mucous membranes are  slightly dry    Mouth/Throat:     Pharynx: No oropharyngeal exudate.  Eyes:     General: No scleral icterus.       Right eye: No discharge.        Left eye: No discharge.     Conjunctiva/sclera: Conjunctivae normal.     Pupils: Pupils are equal, round, and reactive to light.  Neck:     Musculoskeletal: Normal range of motion and neck supple.     Thyroid: No thyromegaly.     Vascular: No JVD.  Cardiovascular:     Rate and Rhythm: Normal rate and regular rhythm.  Heart sounds: Normal heart sounds. No murmur. No friction rub. No gallop.      Comments: On my exam the heart rate is 95 bpm with normal pulses at the radial arteries and no peripheral edema, no JVD Pulmonary:     Effort: Pulmonary effort is normal. No respiratory distress.     Breath sounds: Normal breath sounds. No wheezing or rales.  Abdominal:     General: Bowel sounds are normal. There is no distension.     Palpations: Abdomen is soft. There is no mass.     Tenderness: There is no abdominal tenderness.     Comments: Ostomy present just left of midline, surgical scar is well-healed, the abdomen is very soft and nontender  Musculoskeletal: Normal range of motion.        General: No tenderness.  Lymphadenopathy:     Cervical: No cervical adenopathy.  Skin:    General: Skin is warm and dry.     Findings: No erythema or rash.  Neurological:     Coordination: Coordination normal.     Comments: The patient is slurring his words, he is able to move all 4 extremities and is able to sit up in bed.  He does appear somnolent but able to arouse and follow commands.  Psychiatric:        Behavior: Behavior normal.      ED Treatments / Results  Labs (all labs ordered are listed, but only abnormal results are displayed) Labs Reviewed  COMPREHENSIVE METABOLIC PANEL - Abnormal; Notable for the following components:      Result Value   Sodium 132 (*)    Potassium 3.3 (*)    Glucose, Bld 137 (*)    Calcium 8.6 (*)    AST  14 (*)    All other components within normal limits  CBC - Abnormal; Notable for the following components:   Hemoglobin 12.8 (*)    HCT 38.1 (*)    All other components within normal limits  RAPID URINE DRUG SCREEN, HOSP PERFORMED - Abnormal; Notable for the following components:   Cocaine POSITIVE (*)    All other components within normal limits  URINALYSIS, ROUTINE W REFLEX MICROSCOPIC - Abnormal; Notable for the following components:   Color, Urine STRAW (*)    Specific Gravity, Urine 1.004 (*)    All other components within normal limits  CBG MONITORING, ED - Abnormal; Notable for the following components:   Glucose-Capillary 143 (*)    All other components within normal limits  ETHANOL  LITHIUM LEVEL    EKG None  Radiology No results found.  Procedures Procedures (including critical care time)  Medications Ordered in ED Medications  sodium chloride flush (NS) 0.9 % injection 3 mL (has no administration in time range)     Initial Impression / Assessment and Plan / ED Course  I have reviewed the triage vital signs and the nursing notes.  Pertinent labs & imaging results that were available during my care of the patient were reviewed by me and considered in my medical decision making (see chart for details).  Clinical Course as of Sep 19 1433  Thu Sep 20, 2019  1425 Cocaine positive, mild hyponatremia hypokalemia and hyperglycemia.  Urinalysis is clean, no signs of significant dehydration.  The patient's tachycardia has resolved, he has been resting peacefully   [BM]    Clinical Course User Index [BM] Noemi Chapel, MD       The patient's presentation is consistent with either medication  abstinence or possible overdose.  He does not appear to be in any acute distress, he is normotensive, afebrile and has a heart rate in a normal range at this time.  We will get some basic labs to make sure he is not in renal failure from dehydration and recent surgery, his abdomen  is totally nontender and his ostomy is putting out normal-appearing brownish soft to liquid stool.  He has no tympanitic sounds to percussion, is not guarding and has no respiratory symptoms whatsoever.  Mother reports he has been under stress - she thinks he was using drugs or possible not taking his meds - had trouble eating a burger today - feels like he is not himself.  Labs unremarkable - possible OD - on Amitryptiline - no QT prlongation or QRS prolongation - CT pending.    We will get psychiatric consult as the patient's mother states that he was hallucinating today, possibly decompensation in his bipolar, she thinks he may be on lithium, levels will be added.  TTS consult requested  At change of shift - care signed out to Dr. Clarene Duke - the patient is waking up more and becoming more beligerent TTS consult pending at this time  Final Clinical Impressions(s) / ED Diagnoses   Final diagnoses:  None    ED Discharge Orders    None       Eber Hong, MD 09/22/19 [phone]

## 2019-09-25 ENCOUNTER — Other Ambulatory Visit: Payer: Self-pay

## 2019-09-25 ENCOUNTER — Encounter: Payer: Self-pay | Admitting: General Surgery

## 2019-09-25 ENCOUNTER — Ambulatory Visit (INDEPENDENT_AMBULATORY_CARE_PROVIDER_SITE_OTHER): Payer: Self-pay | Admitting: General Surgery

## 2019-09-25 VITALS — BP 122/84 | HR 105 | Temp 96.6°F | Resp 16 | Ht 71.0 in | Wt 210.0 lb

## 2019-09-25 DIAGNOSIS — K631 Perforation of intestine (nontraumatic): Secondary | ICD-10-CM

## 2019-09-25 DIAGNOSIS — Z933 Colostomy status: Secondary | ICD-10-CM

## 2019-09-25 NOTE — Progress Notes (Signed)
[facility] Note   HPI:  53 y.o. Male presents to clinic for follow-up evaluation of after his hartman's procedure for rectal perforation related to a foreign body. He is doing well with the colostomy and the G tube has been capped.  He says he is having regular Bms and getting back to doing more regular activities.   Review of Systems:  Tired easily Improving endurance All other review of systems: otherwise negative   Vital Signs:  BP 122/84 (BP Location: Left Arm, Patient Position: Sitting, Cuff Size: Normal)   Pulse (!) 105   Temp (!) 96.6 F (35.9 C) (Tympanic)   Resp 16   Ht 5\' 11"  (1.803 m)   Wt 210 lb (95.3 kg)   SpO2 98%   BMI 29.29 kg/m    Physical Exam:  Physical Exam Vitals signs reviewed.  HENT:     Head: Normocephalic.  Cardiovascular:     Rate and Rhythm: Normal rate.  Pulmonary:     Effort: Pulmonary effort is normal.  Abdominal:     General: There is no distension.     Palpations: Abdomen is soft.     Tenderness: There is no abdominal tenderness.     Hernia: No hernia is present.     Comments: G tube site without erythema, G tube balloon deflated and removed without issue, dressing placed, ostomy pink with stool in bag  Musculoskeletal:        General: No swelling.  Skin:    General: Skin is warm and dry.  Neurological:     General: No focal deficit present.     Mental Status: He is alert and oriented to person, place, and time.      Assessment:  60 y.o. 53 Male with a colostomy in place after a rectal perforation.  G tube that was placed for gastric decompression due to inability to place NG has been removed today.  The procedure was very difficult and his perforation was very low down at the levators.  He has minimal cuff remaining if any. Given this I think it is best for him to go see Colorectal to discuss reversal of the colostomy.    Plan:  -Patient wishes to be referred to [facility], will refer to Dr. Morton Stall / Dr.  Drue Flirt -Will need a completion colonoscopy as he has never had a colonoscopy previously  -Will see patient back in a few months to ensure things are moving forward  -Monitor G tube site, change bandage daily or as needed until sealed up  Future Appointments  Date Time Provider [place]  11/27/2019 11:00 AM Virl Cagey, MD [facility] None    All of the above recommendations were discussed with the patient, and all of patient's questions were answered to his expressed satisfaction.  Curlene Labrum, MD [facility] [address] [place], [place] [address] [phone] (office)

## 2019-09-25 NOTE — Patient Instructions (Signed)
Will need colonoscopy to screen remaining colon. Referral to Dr. Gala Romney. Will need referral to Colorectal surgery, Dr. Leighton Ruff, [facility], for discussion about reconnection / reversal surgery due to the fact that there is very minimal rectal stump remaining.   Will have an in person versus phone call (let us know which you prefer) in about 8 weeks to make sure things are progressing.  Change the bandage at the gastrostomy tube site daily or as needed.

## 2019-09-27 DIAGNOSIS — Z933 Colostomy status: Secondary | ICD-10-CM

## 2019-09-27 HISTORY — DX: Colostomy status: Z93.3

## 2019-09-28 ENCOUNTER — Telehealth: Payer: Self-pay

## 2019-09-28 NOTE — Telephone Encounter (Signed)
FYI to RMR.

## 2019-09-28 NOTE — Telephone Encounter (Signed)
Noted  

## 2019-09-28 NOTE — Telephone Encounter (Signed)
Spoke to CM, may schedule new pt OV ASAP. Called pt, OV scheduled 10/02/19 at 10:00am with AB.

## 2019-09-28 NOTE — Telephone Encounter (Signed)
-----   Message from Daneil Dolin, MD sent at 09/28/2019  8:35 AM EDT ----- Regarding: RE: Colonoscopy  Sure; OK with you to get him done ASAP? ----- Message ----- From: Virl Cagey, MD Sent: 09/27/2019  10:09 PM EDT To: Guadalupe Dawn, MD Subject: Colonoscopy                                    Hey,  Rectal perforation patient. I am sending him to [facility] to get reversal because I was way down at the levators when I did the resection.  Was trying to get him teed up for them and he has never had a colonoscopy, so will need that before reversal.    Ria Comment

## 2019-10-02 ENCOUNTER — Other Ambulatory Visit: Payer: Self-pay

## 2019-10-02 ENCOUNTER — Ambulatory Visit (INDEPENDENT_AMBULATORY_CARE_PROVIDER_SITE_OTHER): Payer: PPO | Admitting: Gastroenterology

## 2019-10-02 ENCOUNTER — Other Ambulatory Visit: Payer: Self-pay | Admitting: *Deleted

## 2019-10-02 ENCOUNTER — Encounter: Payer: Self-pay | Admitting: *Deleted

## 2019-10-02 ENCOUNTER — Encounter: Payer: Self-pay | Admitting: Gastroenterology

## 2019-10-02 VITALS — BP 128/86 | HR 97 | Temp 96.6°F | Ht 71.0 in | Wt 207.4 lb

## 2019-10-02 DIAGNOSIS — K631 Perforation of intestine (nontraumatic): Secondary | ICD-10-CM

## 2019-10-02 DIAGNOSIS — Z1211 Encounter for screening for malignant neoplasm of colon: Secondary | ICD-10-CM

## 2019-10-02 MED ORDER — PEG 3350-KCL-NA BICARB-NACL 420 G PO SOLR: 4000.0000 mL | Freq: Once | ORAL | 0 refills | Status: AC

## 2019-10-02 NOTE — Patient Instructions (Signed)
We are arranging a colonoscopy with Dr. Gala Romney in the near future.  Go seek medical attention if your right side bulges and is hard, unable to press down, and severe pain.   Further recommendations to follow!  It was a pleasure to see you today. I want to create trusting relationships with patients to provide genuine, compassionate, and quality care. I value your feedback. If you receive a survey regarding your visit,  I greatly appreciate you taking time to fill this out.   Annitta Needs, PhD, ANP-BC [facility]

## 2019-10-02 NOTE — Progress Notes (Signed)
Primary Care Physician:  Shirlean Mylar, MD Primary Gastroenterologist:  Dr. Jena Gauss   Chief Complaint  Patient presents with  . Colonoscopy    needs TCS per RMR    HPI:   [name] [name] is a 53 y.o. male presenting today at the request of Dr. Algis Greenhouse to complete colonoscopy prior to colostomy reversal. He has a history of rectal perforation with peritonitis related to inserted foreign body. He underwent low anterior resection of sigmoid colon and rectum with end colostomy placement on 08/03/2019. Due to location of perforation low down at levators, Dr. Henreitta Leber has recommended colorectal surgeon at [facility] (Dr. Mariah Milling).   Patient notes soft stool from colostomy. No overt bleeding. Trying to eat healthy. Notes dull pain at midline incision. Notes right-sided abdomen bulging. Noticed a few days ago. No severe pain. No GERD. No dysphagia. No prior colonoscopy.   Cocaine positive on 9/24. He notes he last used cocaine on 10/4.     Past Medical History:  Diagnosis Date  . Anxiety   . Bipolar 1 disorder (HCC)   . Chronic fatigue   . Chronic pain   . Fibromyalgia     Past Surgical History:  Procedure Laterality Date  . COLON RESECTION SIGMOID  08/03/2019   Procedure: COLON RESECTION SIGMOID;  Surgeon: Lucretia Roers, MD;  Location: [facility];  Service: General;;  . COLOSTOMY N/A 08/03/2019   Procedure: COLOSTOMY;  Surgeon: Lucretia Roers, MD;  Location: [facility];  Service: General;  Laterality: N/A;  . FLEXIBLE SIGMOIDOSCOPY N/A 08/03/2019   Procedure: FLEXIBLE SIGMOIDOSCOPY;  Surgeon: Corbin Ade, MD;  Location: [facility];  Service: Endoscopy;  Laterality: N/A;  . KNEE ARTHROSCOPY Left 2006   miniscus tear  . LAPAROTOMY N/A 08/03/2019   Procedure: EXPLORATORY LAPAROTOMY;  Surgeon: Lucretia Roers, MD;  Location: [facility];  Service: General;  Laterality: N/A;  . LYMPH GLAND EXCISION Left 1983  . TONSILLECTOMY      Current Outpatient Medications  Medication  Sig Dispense Refill  . amitriptyline (ELAVIL) 50 MG tablet Take 150-200 mg by mouth at bedtime.    . ARIPiprazole (ABILIFY) 5 MG tablet Take 5 mg by mouth at bedtime.    . clonazePAM (KLONOPIN) 2 MG tablet Take 2 mg by mouth 3 (three) times daily as needed for anxiety.    . DULoxetine (CYMBALTA) 60 MG capsule Take 60 mg by mouth 2 (two) times daily.     Marland Kitchen gabapentin (NEURONTIN) 400 MG capsule Take 800 mg by mouth See admin instructions. Take 2 capsules (800 mg) by mouth twice daily; may take an additional 800 mg as needed for nerve pain.    Marland Kitchen HYDROcodone-acetaminophen (NORCO) 7.5-325 MG tablet Take 1 tablet by mouth every 6 (six) hours as needed for moderate pain.     Marland Kitchen ibuprofen (ADVIL) 200 MG tablet Take 400 mg by mouth 2 (two) times daily.     . meloxicam (MOBIC) 7.5 MG tablet Take 7.5 mg by mouth 2 (two) times daily.     . pravastatin (PRAVACHOL) 10 MG tablet Take 10 mg by mouth at bedtime.      No current facility-administered medications for this visit.     Allergies as of 10/02/2019 - Review Complete 10/02/2019  Allergen Reaction Noted  . Demerol [meperidine]  05/30/2017  . Levaquin [levofloxacin in d5w]  05/30/2017  . Morphine and related  05/30/2017  . Other  07/08/2011  . Penicillins  04/26/2007    Family History  Problem Relation Age of Onset  . Diabetes Mother   . Heart attack Father   . Diabetes Maternal Grandmother   . Colon cancer Neg Hx   . Colon polyps Neg Hx     Social History   Socioeconomic History  . Marital status: Legally Separated    Spouse name: Not on file  . Number of children: Not on file  . Years of education: Not on file  . Highest education level: Not on file  Occupational History  . Not on file  Social Needs  . Financial resource strain: Not on file  . Food insecurity    Worry: Not on file    Inability: Not on file  . Transportation needs    Medical: Not on file    Non-medical: Not on file  Tobacco Use  . Smoking status: Current Every  Day Smoker    Packs/day: 0.50    Years: 10.00    Pack years: 5.00  . Smokeless tobacco: Never Used  Substance and Sexual Activity  . Alcohol use: Not Currently    Comment: occasional; denied 10/02/19  . Drug use: Yes    Types: Cocaine    Comment: last use 09/30/19  . Sexual activity: Not on file  Lifestyle  . Physical activity    Days per week: Not on file    Minutes per session: Not on file  . Stress: Not on file  Relationships  . Social Musicianconnections    Talks on phone: Not on file    Gets together: Not on file    Attends religious service: Not on file    Active member of club or organization: Not on file    Attends meetings of clubs or organizations: Not on file    Relationship status: Not on file  . Intimate partner violence    Fear of current or ex partner: Not on file    Emotionally abused: Not on file    Physically abused: Not on file    Forced sexual activity: Not on file  Other Topics Concern  . Not on file  Social History Narrative  . Not on file    Review of Systems: Gen: Denies any fever, chills, fatigue, weight loss, lack of appetite.  CV: Denies chest pain, heart palpitations, peripheral edema, syncope.  Resp: Denies shortness of breath at rest or with exertion. Denies wheezing or cough.  GI: see HPI GU : Denies urinary burning, urinary frequency, urinary hesitancy MS: Denies joint pain, muscle weakness, cramps, or limitation of movement.  Derm: Denies rash, itching, dry skin Psych: Denies depression, anxiety, memory loss, and confusion Heme: Denies bruising, bleeding, and enlarged lymph nodes.  Physical Exam: BP 128/86   Pulse 97   Temp (!) 96.6 F (35.9 C) (Temporal)   Ht 5\' 11"  (1.803 m)   Wt 207 lb 6.4 oz (94.1 kg)   BMI 28.93 kg/m  General:   Alert and oriented. Pleasant and cooperative. Well-nourished and well-developed.  Head:  Normocephalic and atraumatic. Eyes:  Without icterus, sclera clear and conjunctiva pink.  Ears:  Normal auditory  acuity. Lungs:  Clear to auscultation bilaterally. No wheezes, rales, or rhonchi. No distress.  Heart:  S1, S2 present without murmurs appreciated.  Abdomen:  +BS, soft, non-tender and non-distended. Midline incision well-healed. To right of midline, palpable bulge, protruding with raising head. When standing up, right sided abdomen bulging but soft. Able to reduce.  Rectal:  Deferred  Extremities:  Without  edema. Neurologic:  Alert and  oriented x4. Psych:  Alert and cooperative. Normal mood and affect.

## 2019-10-04 ENCOUNTER — Encounter (HOSPITAL_COMMUNITY): Payer: Self-pay | Admitting: Anesthesiology

## 2019-10-04 NOTE — Patient Instructions (Addendum)
Vernor Monnig Teagarden  10/04/2019     @PREFPERIOPPHARMACY @   Your procedure is scheduled on  10/08/2019 .  Report to 12/08/2019 at  0715  A.M.  Call this number if you have problems the morning of surgery:  [phone]   Remember:  Follow the diet and prep instructions given to you by Dr [phone] office.                      Take these medicines the morning of surgery with A SIP OF WATER  Clonazepam, cymbalta, gabapentin, Hydrocodone(if needed).    Do not wear jewelry, make-up or nail polish.  Do not wear lotions, powders, or perfume. Please wear deodorant and brush your teeth.  Do not shave 48 hours prior to surgery.  Men may shave face and neck.  Do not bring valuables to the hospital.  [facility] is not responsible for any belongings or valuables.  Contacts, dentures or bridgework may not be worn into surgery.  Leave your suitcase in the car.  After surgery it may be brought to your room.  For patients admitted to the hospital, discharge time will be determined by your treatment team.  Patients discharged the day of surgery will not be allowed to drive home.   Name and phone number of your driver:   family Special instructions:  None  Please read over the following fact sheets that you were given. Anesthesia Post-op Instructions and Care and Recovery After Surgery       Colonoscopy, Adult, Care After This sheet gives you information about how to care for yourself after your procedure. Your health care provider may also give you more specific instructions. If you have problems or questions, contact your health care provider. What can I expect after the procedure? After the procedure, it is common to have:  A small amount of blood in your stool for 24 hours after the procedure.  Some gas.  Mild abdominal cramping or bloating. Follow these instructions at home: General instructions  For the first 24 hours after the procedure: ? Do not drive or use machinery. ?  Do not sign important documents. ? Do not drink alcohol. ? Do your regular daily activities at a slower pace than normal. ? Eat soft, easy-to-digest foods.  Take over-the-counter or prescription medicines only as told by your health care provider. Relieving cramping and bloating   Try walking around when you have cramps or feel bloated.  Apply heat to your abdomen as told by your health care provider. Use a heat source that your health care provider recommends, such as a moist heat pack or a heating pad. ? Place a towel between your skin and the heat source. ? Leave the heat on for 20-30 minutes. ? Remove the heat if your skin turns bright red. This is especially important if you are unable to feel pain, heat, or cold. You may have a greater risk of getting burned. Eating and drinking   Drink enough fluid to keep your urine pale yellow.  Resume your normal diet as instructed by your health care provider. Avoid heavy or fried foods that are hard to digest.  Avoid drinking alcohol for as long as instructed by your health care provider. Contact a health care provider if:  You have blood in your stool 2-3 days after the procedure. Get help right away if:  You have more than a small spotting of blood in your  stool.  You pass large blood clots in your stool.  Your abdomen is swollen.  You have nausea or vomiting.  You have a fever.  You have increasing abdominal pain that is not relieved with medicine. Summary  After the procedure, it is common to have a small amount of blood in your stool. You may also have mild abdominal cramping and bloating.  For the first 24 hours after the procedure, do not drive or use machinery, sign important documents, or drink alcohol.  Contact your health care provider if you have a lot of blood in your stool, nausea or vomiting, a fever, or increased abdominal pain. This information is not intended to replace advice given to you by your health  care provider. Make sure you discuss any questions you have with your health care provider. Document Released: 07/27/2004 Document Revised: 10/05/2017 Document Reviewed: 02/24/2016 Elsevier Patient Education  2020 [employer] After These instructions provide you with information about caring for yourself after your procedure. Your health care provider may also give you more specific instructions. Your treatment has been planned according to current medical practices, but problems sometimes occur. Call your health care provider if you have any problems or questions after your procedure. What can I expect after the procedure? After your procedure, you may:  Feel sleepy for several hours.  Feel clumsy and have poor balance for several hours.  Feel forgetful about what happened after the procedure.  Have poor judgment for several hours.  Feel nauseous or vomit.  Have a sore throat if you had a breathing tube during the procedure. Follow these instructions at home: For at least 24 hours after the procedure:      Have a responsible adult stay with you. It is important to have someone help care for you until you are awake and alert.  Rest as needed.  Do not: ? Participate in activities in which you could fall or become injured. ? Drive. ? Use heavy machinery. ? Drink alcohol. ? Take sleeping pills or medicines that cause drowsiness. ? Make important decisions or sign legal documents. ? Take care of children on your own. Eating and drinking  Follow the diet that is recommended by your health care provider.  If you vomit, drink water, juice, or soup when you can drink without vomiting.  Make sure you have little or no nausea before eating solid foods. General instructions  Take over-the-counter and prescription medicines only as told by your health care provider.  If you have sleep apnea, surgery and certain medicines can increase your  risk for breathing problems. Follow instructions from your health care provider about wearing your sleep device: ? Anytime you are sleeping, including during daytime naps. ? While taking prescription pain medicines, sleeping medicines, or medicines that make you drowsy.  If you smoke, do not smoke without supervision.  Keep all follow-up visits as told by your health care provider. This is important. Contact a health care provider if:  You keep feeling nauseous or you keep vomiting.  You feel light-headed.  You develop a rash.  You have a fever. Get help right away if:  You have trouble breathing. Summary  For several hours after your procedure, you may feel sleepy and have poor judgment.  Have a responsible adult stay with you for at least 24 hours or until you are awake and alert. This information is not intended to replace advice given to you by your health care  provider. Make sure you discuss any questions you have with your health care provider. Document Released: 04/04/2016 Document Revised: 03/13/2018 Document Reviewed: 04/04/2016 Elsevier Patient Education  2020 [employer].

## 2019-10-05 ENCOUNTER — Encounter (HOSPITAL_COMMUNITY): Payer: Self-pay

## 2019-10-05 ENCOUNTER — Other Ambulatory Visit: Payer: Self-pay

## 2019-10-05 ENCOUNTER — Encounter (HOSPITAL_COMMUNITY)
Admission: RE | Admit: 2019-10-05 | Discharge: 2019-10-05 | Disposition: A | Discharge status: Home / Self Care | Payer: PPO | Source: Ambulatory Visit | Attending: Internal Medicine | Admitting: Internal Medicine

## 2019-10-05 ENCOUNTER — Other Ambulatory Visit (HOSPITAL_COMMUNITY)
Admission: RE | Admit: 2019-10-05 | Discharge: 2019-10-05 | Disposition: A | Discharge status: Home / Self Care | Payer: PPO | Source: Ambulatory Visit | Attending: Internal Medicine | Admitting: Internal Medicine

## 2019-10-05 DIAGNOSIS — Z01812 Encounter for preprocedural laboratory examination: Secondary | ICD-10-CM | POA: Insufficient documentation

## 2019-10-05 DIAGNOSIS — Z20828 Contact with and (suspected) exposure to other viral communicable diseases: Secondary | ICD-10-CM | POA: Insufficient documentation

## 2019-10-05 LAB — RAPID URINE DRUG SCREEN, HOSP PERFORMED
Amphetamines: NOT DETECTED
Barbiturates: NOT DETECTED
Benzodiazepines: NOT DETECTED
Cocaine: POSITIVE — AB
Opiates: NOT DETECTED
Tetrahydrocannabinol: NOT DETECTED

## 2019-10-05 LAB — SARS CORONAVIRUS 2 (TAT 6-24 HRS): SARS Coronavirus 2: NEGATIVE

## 2019-10-05 NOTE — Progress Notes (Signed)
Results for MARWIN, PRIMMER (MRN [id number]) as of 10/05/2019 12:00  Ref. Range 10/05/2019 08:39  Amphetamines Latest Ref Range: NONE DETECTED  NONE DETECTED  Barbiturates Latest Ref Range: NONE DETECTED  NONE DETECTED  Benzodiazepines Latest Ref Range: NONE DETECTED  NONE DETECTED  Opiates Latest Ref Range: NONE DETECTED  NONE DETECTED  COCAINE Latest Ref Range: NONE DETECTED  POSITIVE (A)  Tetrahydrocannabinol Latest Ref Range: NONE DETECTED  NONE DETECTED   How would you like to proceed? Dr. Hilaria Ota is our anesthesiologist on Monday and wouldn't do him for screening purposes.

## 2019-10-05 NOTE — Pre-Procedure Instructions (Signed)
UDS positive for cocaine. Dr Gala Romney notified and procedure will be canceled. Called patient and told him that Dr Gala Romney states he cannot have this procedure done until he is cocaine free. Patient verbalized understanding and was instructed to not take prep and to hold it for when he is rescheduled. He is to call Dr Roseanne Kaufman office on Monday morning to reschedule his procedure.

## 2019-10-07 NOTE — Assessment & Plan Note (Addendum)
53 year old male with history of low anterior resection of sigmoid colon and rectum with end colostomy placement 08/03/2019 after rectal perforation resulting in peritonitis s/p foreign body insertion. Colostomy placed and very short rectal stump remains. No prior endoscopic evaluation of colon and will need surgery for reversal at [facility]. Dr. Constance Haw has requested endoscopic evaluation of colon prior to reversal, as he has never had colon evaluated. Will need to complete this via ostomy. However, he was cocaine positive on drug screen 9/24, and he notes last use on 10/4. I discussed with him the importance of avoidance of cocaine going forward, the need for drug screen prior, and the possibility of canceling elective colonoscopy if he were to be positive on drug screen. He stated understanding. Drug screen ordered at pre-op.   Proceed with colonoscopy via ostomy with Dr. Gala Romney in near future: the risks, benefits, and alternatives have been discussed with the patient in detail. The patient states understanding and desires to proceed. PROPOFOL due to polypharmacy and drug use IMPERATIVE that he refrain from any illicit drug use and will need drug screen prior. Possibility of postponing procedure discussed at time of appointment.

## 2019-10-08 ENCOUNTER — Telehealth: Payer: Self-pay

## 2019-10-08 NOTE — Telephone Encounter (Addendum)
[name] [name] at [facility] called office, pt's TCS w/Propofol w/RMR was cancelled for today d/t positive cocaine.  Called pt, TCS rescheduled to next available 01/03/20 at 10:30am. Pt's speech was very slurred. He stated he took 4 pain pills for pain. Advised him cocaine will need to be negative for him to have procedure per RMR. Endo scheduler informed. Pre-op and COVID test scheduled for 01/01/20. Appt letter mailed with new procedure instructions.  FYI to RMR.

## 2019-10-12 DIAGNOSIS — Z933 Colostomy status: Secondary | ICD-10-CM | POA: Diagnosis not present

## 2019-11-15 ENCOUNTER — Other Ambulatory Visit: Payer: Self-pay

## 2019-11-15 ENCOUNTER — Encounter: Payer: Self-pay | Admitting: General Surgery

## 2019-11-15 ENCOUNTER — Ambulatory Visit (INDEPENDENT_AMBULATORY_CARE_PROVIDER_SITE_OTHER): Payer: PPO | Admitting: General Surgery

## 2019-11-15 VITALS — BP 134/82 | HR 90 | Temp 98.7°F | Resp 18 | Ht 71.0 in | Wt 210.0 lb

## 2019-11-15 DIAGNOSIS — K432 Incisional hernia without obstruction or gangrene: Secondary | ICD-10-CM | POA: Diagnosis not present

## 2019-11-15 DIAGNOSIS — Z933 Colostomy status: Secondary | ICD-10-CM | POA: Diagnosis not present

## 2019-11-15 NOTE — Progress Notes (Signed)
[facility] Note   HPI:  53 y.o. Male presents to clinic for follow-up evaluation after emergency Hartman's procedure for rectal perforation. He is doing fair but he picked up groceries last week and felt something tear. He then started to notice a bulge. Prior to this he says he was doing fair. He was suppose to get a colonoscopy with Dr. Jena Gauss 09/2019 but this was canceled due to the patient being positive for cocaine. I have referred him to Dr. Byrd Hesselbach at [facility] due to him having likely minimal rectal cuff remaining and need for a coloanal anastomosis if he were to get reversed.    He says he is trying to not use cocaine but he has to use it to self medicate.  He is eating and drinking and having stools from his colostomy.    Review of Systems:  Pain/ tearing sensation of abdomen Bulge on abdomen All other review of systems: otherwise negative   Vital Signs:  BP 134/82 (BP Location: Right Arm, Patient Position: Sitting, Cuff Size: Normal)   Pulse 90   Temp 98.7 F (37.1 C) (Oral)   Resp 18   Ht 5\' 11"  (1.803 m)   Wt 210 lb (95.3 kg)   SpO2 97%   BMI 29.29 kg/m    Physical Exam:  Physical Exam Constitutional:      Appearance: Normal appearance.  HENT:     Head: Normocephalic.     Nose: Nose normal.  Eyes:     Pupils: Pupils are equal, round, and reactive to light.  Neck:     Musculoskeletal: Normal range of motion.  Cardiovascular:     Rate and Rhythm: Normal rate.  Pulmonary:     Effort: Pulmonary effort is normal.  Abdominal:     General: There is no distension.     Palpations: Abdomen is soft.     Tenderness: There is abdominal tenderness.     Hernia: A hernia is present. Hernia is present in the ventral area.     Comments: Fascia defect at least 8cm, reduces easily, some minor tenderness; colostomy with stool in bag and no obvious parastomal hernia but difficult to examine due to bag  Musculoskeletal: Normal range of motion.  Skin:  General: Skin is warm and dry.  Neurological:     General: No focal deficit present.     Mental Status: He is alert and oriented to person, place, and time.  Psychiatric:        Mood and Affect: Mood normal.        Thought Content: Thought content normal.     Comments: A little jittery      Assessment:  53 y.o. yo Male s/p emergency Hartman's procedure for a rectal perforation with fecal peritonitis due to vibrator in the rectum. He now has a large ventral hernia at his midline. I cannot appreciate an obvious parastomal hernia. This was not present the last time I saw him a few months back.  I can feel the fascia on at least part of the hernia and I would estimate it at least 8 cm.  No signs of incarceration or obstruction, reduces.  Patient continues to use drugs.   Plan:  - Risk of incarceration low due to defect size   - He wants to try to get reversed but remains on cocaine at times and takes chronic pains meds. He only has one shot at getting reversed given the lower perforation and resection and will need a coloanal  anastomosis I fear.  He may not ever be a great candidate for reversal.  He now also has a large ventral hernia.  - I have talked to him an encouaged discontinuing drug use so he can get his surgeries  - We discussed that Dr. Morton Stall will decide if he is a candidate for reversal, and at that time a determination about his hernia can be made. I warned him that with a hernia of this size that it is likely to recur and he may require multiple surgeries.  - Will see patient PRN, he says he will keep me in up to date. He is rescheduled for colonoscopy January 2021 as he has never had one and is 44.   All of the above recommendations were discussed with the patient, and all of patient's questions were answered to his expressed satisfaction. I spent over 25 minutes with the patient explaining that he has a hernia and discussing the options.   Curlene Labrum, MD [facility] [address] [place], [place] [address] [phone] (office)

## 2019-11-15 NOTE — Patient Instructions (Addendum)
Hernia Belt for patient with ostomy [email or web address]  Ventral/ Incisional Hernia  A ventral hernia is a bulge of tissue from inside the abdomen that pushes through a weak area of the muscles that form the front wall of the abdomen. The tissues inside the abdomen are inside a sac (peritoneum). These tissues include the small intestine, large intestine, and the fatty tissue that covers the intestines (omentum). Sometimes, the bulge that forms a hernia contains intestines. Other hernias contain only fat. Ventral hernias do not go away without surgical treatment. There are several types of ventral hernias. You may have:  A hernia at an incision site from previous abdominal surgery (incisional hernia).  A hernia just above the belly button (epigastric hernia), or at the belly button (umbilical hernia). These types of hernias can develop from heavy lifting or straining.  A hernia that comes and goes (reducible hernia). It may be visible only when you lift or strain. This type of hernia can be pushed back into the abdomen (reduced).  A hernia that traps abdominal tissue inside the hernia (incarcerated hernia). This type of hernia does not reduce.  A hernia that cuts off blood flow to the tissues inside the hernia (strangulated hernia). The tissues can start to die if this happens. This is a very painful bulge that cannot be reduced. A strangulated hernia is a medical emergency. What are the causes? This condition is caused by abdominal tissue putting pressure on an area of weakness in the abdominal muscles. What increases the risk? The following factors may make you more likely to develop this condition:  Being male.  Being 79 or older.  Being overweight or obese.  Having had previous abdominal surgery, especially if there was an infection after surgery.  Having had an injury to the abdominal wall.  Having had several  pregnancies.  Having a buildup of fluid inside the abdomen (ascites). What are the signs or symptoms? The only symptom of a ventral hernia may be a painless bulge in the abdomen. A reducible hernia may be visible only when you strain, cough, or lift. Other symptoms may include:  Dull pain.  A feeling of pressure. Signs and symptoms of a strangulated hernia may include:  Increasing pain.  Nausea and vomiting.  Pain when pressing on the hernia.  The skin over the hernia turning red or purple.  Constipation.  Blood in the stool (feces). How is this diagnosed? This condition may be diagnosed based on:  Your symptoms.  Your medical history.  A physical exam. You may be asked to cough or strain while standing. These actions increase the pressure inside your abdomen and force the hernia through the opening in your muscles. Your health care provider may try to reduce the hernia by pressing on it.  Imaging studies, such as an ultrasound or CT scan. How is this treated? This condition is treated with surgery. If you have a strangulated hernia, surgery is done as soon as possible. If your hernia is small and not incarcerated, you may be asked to lose some weight before surgery. Follow these instructions at home:  Follow instructions from your health care provider about eating or drinking restrictions.  If you are overweight, your health care provider may recommend that you increase your activity level and eat a healthier diet.  Do not lift anything that is heavier than 10 lb (4.5 kg).  Return to your normal activities as told by your health care provider. Ask your health care provider what activities are  safe for you. You may need to avoid activities that increase pressure on your hernia.  Take over-the-counter and prescription medicines only as told by your health care provider.  Keep all follow-up visits as told by your health care provider. This is important. Contact a health  care provider if:  Your hernia gets larger.  Your hernia becomes painful. Get help right away if:  Your hernia becomes increasingly painful.  You have pain along with any of the following: ? Changes in skin color in the area of the hernia. ? Nausea. ? Vomiting. ? Fever. Summary  A ventral hernia is a bulge of tissue from inside the abdomen that pushes through a weak area of the muscles that form the front wall of the abdomen.  This condition is treated with surgery, which may be urgent depending on your hernia.  Do not lift anything that is heavier than 10 lb (4.5 kg), and follow activity instructions from your health care provider. This information is not intended to replace advice given to you by your health care provider. Make sure you discuss any questions you have with your health care provider. Document Released: 11/29/2012 Document Revised: 01/25/2018 Document Reviewed: 07/04/2017 Elsevier Patient Education  2020 [employer].

## 2019-11-27 ENCOUNTER — Ambulatory Visit: Payer: PPO | Admitting: General Surgery

## 2019-11-29 DIAGNOSIS — Z933 Colostomy status: Secondary | ICD-10-CM | POA: Diagnosis not present

## 2019-12-13 DIAGNOSIS — K631 Perforation of intestine (nontraumatic): Secondary | ICD-10-CM | POA: Diagnosis not present

## 2019-12-23 DIAGNOSIS — K631 Perforation of intestine (nontraumatic): Secondary | ICD-10-CM

## 2019-12-23 HISTORY — DX: Perforation of intestine (nontraumatic): K63.1

## 2019-12-26 ENCOUNTER — Telehealth: Payer: Self-pay | Admitting: Internal Medicine

## 2019-12-26 NOTE — Telephone Encounter (Signed)
Called patient. He stated he needs to r/s his procedure scheduled for 1/7. Reports that 1) his "whole family" has covid 2) he also has chronic pain and takes pain pills that he just needs to r/s. I advised patient next available was 3/18 at 11:00am. He advised still wanted to r/s. Procedure rescheduled. Aware will mail new prep instructions with new pre-op/covid testing appt. Confirmed address. LMOVM for endo making aware of appt change. This makes 2nd r/s as 1st time he was + cocaine day of procedure. FYI to RMR and AB

## 2019-12-26 NOTE — Telephone Encounter (Signed)
[place], HE NEEDS TO MOVE HIS APPOINTMENT OUT A MONTH

## 2019-12-27 ENCOUNTER — Encounter: Payer: Self-pay | Admitting: *Deleted

## 2019-12-27 ENCOUNTER — Encounter: Payer: Self-pay | Admitting: Internal Medicine

## 2019-12-27 NOTE — Telephone Encounter (Signed)
[name] please schedule OV and needs to be done prior to 3/18 procedure. Thanks!

## 2019-12-27 NOTE — Telephone Encounter (Signed)
CALLED PATIENT AND L/M ABOUT APPOINTMENT AND ALSO MAILED LETTER

## 2019-12-27 NOTE — Telephone Encounter (Signed)
FYI to AB.  

## 2019-12-27 NOTE — Telephone Encounter (Signed)
This pt needs an ov to review his situation BEFORE he is re-scheduled for TCS.  Compliance challenges

## 2020-01-01 ENCOUNTER — Other Ambulatory Visit (HOSPITAL_COMMUNITY): Payer: PPO

## 2020-01-01 ENCOUNTER — Encounter (HOSPITAL_COMMUNITY): Payer: PPO

## 2020-01-07 DIAGNOSIS — I10 Essential (primary) hypertension: Secondary | ICD-10-CM | POA: Diagnosis not present

## 2020-01-07 DIAGNOSIS — F330 Major depressive disorder, recurrent, mild: Secondary | ICD-10-CM | POA: Diagnosis not present

## 2020-01-07 DIAGNOSIS — R5382 Chronic fatigue, unspecified: Secondary | ICD-10-CM | POA: Diagnosis not present

## 2020-01-07 DIAGNOSIS — G629 Polyneuropathy, unspecified: Secondary | ICD-10-CM | POA: Diagnosis not present

## 2020-01-07 DIAGNOSIS — M797 Fibromyalgia: Secondary | ICD-10-CM | POA: Diagnosis not present

## 2020-01-07 DIAGNOSIS — H3530 Unspecified macular degeneration: Secondary | ICD-10-CM | POA: Diagnosis not present

## 2020-01-07 DIAGNOSIS — Z6835 Body mass index (BMI) 35.0-35.9, adult: Secondary | ICD-10-CM | POA: Diagnosis not present

## 2020-01-07 DIAGNOSIS — G894 Chronic pain syndrome: Secondary | ICD-10-CM | POA: Diagnosis not present

## 2020-01-15 NOTE — Progress Notes (Signed)
Referring Provider: Maurice Small, MD Primary Care Physician:  Maurice Small, MD Primary GI Physician: Dr. Gala Romney  Chief Complaint  Patient presents with  . Colonoscopy    HPI:   [name] [name] is a 54 y.o. male presenting today for office visit prior to scheduled colonoscopy. He was last seen on 10/02/19  at the request of Dr. Curlene Labrum to complete colonoscopy prior to colostomy reversal. He has a history of rectal perforation with peritonitis related to inserted foreign body. He underwent low anterior resection of sigmoid colon and rectum with end colostomy placement on 08/03/2019. Due to location of perforation low down at levators, Dr. Constance Haw has recommended colorectal surgeon at [facility] (Dr. Gena Fray). At his office visit, he reported soft stool from colostomy. No overt bleeding. Dull pain at midline incision and right-sided abdominal bulging. Plans were to pursue TCS via ostomy with propofol with Dr. Gala Romney with UDS at pre-op.   Patient tested positive for cocaine at pre-op on 10/05/19. He was rescheduled for 01/03/20. The called to cancel a second time due to his family having covid. Per RMR, patient needed OV to review his situation before rescheduling due to compliance challenges.   Follow-up with Dr. Constance Haw on 11/15/19. Patient reported doing well but after picking up groceries the week prior, he felt a tear and noticed a bulge. Stated he was trying to avoid cocaine but has to use it to self medicate. He was advised to discontinue drug use so he can get his surgeries. He was to follow up with Dr. Morton Stall with [facility] to determine if he is a candidate for reversal. Also would discuss hernia with Dr. Morton Stall. Concerns for multiple surgeries to be needed with the large size of the hernia as it would likely recur. Follow-up with patient PRN.   Saw Dr. Morton Stall with general surgery with [facility] on 12/13/19. Clinically patient was going well. Last used cocaine the day prior to OV.  States patient  needed colonoscopy with ostomy and rectum scoped. He would need to quit cocaine at least 8 weeks prior to surgery. Plans to follow up in 1 month with UDS at next visit.   Today: Reports picking up a bag of groceries about 2 weeks ago and he felt a tear in his abdomen to the right side of his pouch. Has pain at this site. This is improving. About a 4/10. Isn't picking up anything heavier than a gallon of milk. Also with large hernia on the right side that has been present for couple months. Has a cloth belt to keep hernia pushed in and this helps. Soft stools comes out of ostomy. No trouble with ostomy site. No blood in the stool.  No black stools.  Has some clear discharge from rectum at times. No nausea or vomiting. No GERD symptoms. No dysphagia. Eating normally. States he is eating 3 meals a day. Hasn't tried to lose weight. Weight is down about 18 lbs in the last 2 months. .   No cocaine use since December 2020. Didn't have trouble stopping.  States he is ready to have his procedures.   Past Medical History:  Diagnosis Date  . Anxiety   . Bipolar 1 disorder ([place])   . Chronic fatigue   . Chronic pain   . Fibromyalgia     Past Surgical History:  Procedure Laterality Date  . COLON RESECTION SIGMOID  08/03/2019   Procedure: COLON RESECTION SIGMOID;  Surgeon: Virl Cagey, MD;  Location: [facility] ORS;  Service: General;;  . COLOSTOMY N/A 08/03/2019   Procedure: COLOSTOMY;  Surgeon: Lucretia Roers, MD;  Location: [facility] ORS;  Service: General;  Laterality: N/A;  . FLEXIBLE SIGMOIDOSCOPY N/A 08/03/2019   Procedure: FLEXIBLE SIGMOIDOSCOPY;  Surgeon: Corbin Ade, MD;  Location: [facility] ENDO SUITE;  Service: Endoscopy;  Laterality: N/A;  . KNEE ARTHROSCOPY Left 2006   miniscus tear  . LAPAROTOMY N/A 08/03/2019   Procedure: EXPLORATORY LAPAROTOMY;  Surgeon: Lucretia Roers, MD;  Location: [facility] ORS;  Service: General;  Laterality: N/A;  . LYMPH GLAND EXCISION Left 1983  . TONSILLECTOMY      Current  Outpatient Medications  Medication Sig Dispense Refill  . acetaminophen (TYLENOL) 500 MG tablet Take 500 mg by mouth every 6 (six) hours as needed.    Marland Kitchen amitriptyline (ELAVIL) 50 MG tablet Take 150-200 mg by mouth at bedtime.    . ARIPiprazole (ABILIFY) 5 MG tablet Take 5 mg by mouth at bedtime.    . clonazePAM (KLONOPIN) 2 MG tablet Take 2 mg by mouth 3 (three) times daily as needed for anxiety.    . DULoxetine (CYMBALTA) 60 MG capsule Take 60 mg by mouth 2 (two) times daily.     Marland Kitchen gabapentin (NEURONTIN) 400 MG capsule Take 800 mg by mouth See admin instructions. Take 2 capsules (800 mg) by mouth twice daily; may take an additional 800 mg as needed for nerve pain.    Marland Kitchen HYDROcodone-acetaminophen (NORCO) 7.5-325 MG tablet Take 1 tablet by mouth every 6 (six) hours as needed for moderate pain.     . meloxicam (MOBIC) 7.5 MG tablet Take 7.5 mg by mouth 2 (two) times daily.     . pravastatin (PRAVACHOL) 10 MG tablet Take 10 mg by mouth at bedtime.      No current facility-administered medications for this visit.    Allergies as of 01/16/2020 - Review Complete 01/16/2020  Allergen Reaction Noted  . Demerol [meperidine]  05/30/2017  . Levaquin [levofloxacin in d5w]  05/30/2017  . Morphine and related  05/30/2017  . Other  07/08/2011  . Penicillins  04/26/2007    Family History  Problem Relation Age of Onset  . Diabetes Mother   . Heart attack Father   . Diabetes Maternal Grandmother   . Colon cancer Neg Hx   . Colon polyps Neg Hx     Social History   Socioeconomic History  . Marital status: Legally Separated    Spouse name: Not on file  . Number of children: Not on file  . Years of education: Not on file  . Highest education level: Not on file  Occupational History  . Not on file  Tobacco Use  . Smoking status: Current Every Day Smoker    Packs/day: 0.50    Years: 10.00    Pack years: 5.00  . Smokeless tobacco: Never Used  Substance and Sexual Activity  . Alcohol use: Not  Currently    Comment: occasional; denied 10/02/19  . Drug use: Yes    Types: Cocaine    Comment: last used in December 2020.   Marland Kitchen Sexual activity: Not Currently  Other Topics Concern  . Not on file  Social History Narrative  . Not on file   Social Determinants of Health   Financial Resource Strain:   . Difficulty of Paying Living Expenses: Not on file  Food Insecurity:   . Worried About Programme researcher, broadcasting/film/video in the Last Year: Not on file  . Ran Out of Food  in the Last Year: Not on file  Transportation Needs:   . Lack of Transportation (Medical): Not on file  . Lack of Transportation (Non-Medical): Not on file  Physical Activity:   . Days of Exercise per Week: Not on file  . Minutes of Exercise per Session: Not on file  Stress:   . Feeling of Stress : Not on file  Social Connections:   . Frequency of Communication with Friends and Family: Not on file  . Frequency of Social Gatherings with Friends and Family: Not on file  . Attends Religious Services: Not on file  . Active Member of Clubs or Organizations: Not on file  . Attends Banker Meetings: Not on file  . Marital Status: Not on file    Review of Systems: Gen: Denies fever, chills, presyncope, or syncope.  Reports severe chronic fatigue and chronic muscle pain since 2009.  CV: Denies chest pain or palpitations Resp: Denies dyspnea at rest or cough GI: See HPI Derm: Denies rash Heme: Denies bruising or bleeding.  Physical Exam: BP (!) 141/83   Pulse 82   Temp (!) 97.3 F (36.3 C) (Oral)   Ht 5\' 11"  (1.803 m)   Wt 192 lb 6.4 oz (87.3 kg)   BMI 26.83 kg/m  General:   Alert and oriented. No distress noted. Pleasant and cooperative.  Head:  Normocephalic and atraumatic. Eyes:  Conjuctiva clear without scleral icterus. Heart:  S1, S2 present without murmurs appreciated. Lungs:  Clear to auscultation bilaterally. No wheezes, rales, or rhonchi. No distress.  Abdomen:  +BS, soft, non-tender and  non-distended.  Ostomy bag in place with soft brown stool. Large ventral hernia reduces when patient lays down on exam table.  This is nontender.  No rebound or guarding. No HSM.. Msk:  Symmetrical without gross deformities. Normal posture. Extremities:  Without edema. Neurologic:  Alert and  oriented x4 Psych:  Normal mood and affect.

## 2020-01-16 ENCOUNTER — Encounter: Payer: Self-pay | Admitting: Gastroenterology

## 2020-01-16 ENCOUNTER — Ambulatory Visit (INDEPENDENT_AMBULATORY_CARE_PROVIDER_SITE_OTHER): Payer: PPO | Admitting: Gastroenterology

## 2020-01-16 ENCOUNTER — Other Ambulatory Visit: Payer: Self-pay

## 2020-01-16 ENCOUNTER — Telehealth: Payer: Self-pay

## 2020-01-16 VITALS — BP 141/83 | HR 82 | Temp 97.3°F | Ht 71.0 in | Wt 192.4 lb

## 2020-01-16 DIAGNOSIS — R634 Abnormal weight loss: Secondary | ICD-10-CM

## 2020-01-16 DIAGNOSIS — Z1211 Encounter for screening for malignant neoplasm of colon: Secondary | ICD-10-CM

## 2020-01-16 DIAGNOSIS — K631 Perforation of intestine (nontraumatic): Secondary | ICD-10-CM

## 2020-01-16 DIAGNOSIS — Z7689 Persons encountering health services in other specified circumstances: Secondary | ICD-10-CM | POA: Insufficient documentation

## 2020-01-16 NOTE — Telephone Encounter (Signed)
TCS w/Propofol w/RMR already scheduled for 03/13/20. Informed endo scheduler to add comment, needs TCS via ostomy and rectum. Needs UDS at pre-op.

## 2020-01-16 NOTE — Assessment & Plan Note (Addendum)
54 year old male with history of rectal perforation with peritonitis related to inserted foreign body now s/p low anterior resection of the sigmoid colon and rectum with end colostomy placement on 08/03/2019.  Very short rectal stump remains.  He has never had a colonoscopy.  Per Dr. Henreitta Leber as well as Dr. Byrd Hesselbach (general surgeon with [facility]), patient needs colonoscopy via ostomy and rectum prior to colostomy reversal.  Procedure was originally scheduled in October 2020 but he tested positive for cocaine at preop.  He is currently scheduled for 03/13/2020.  He denies any cocaine use since December 2020 as he desires to undergo colostomy reversal. He is without any significant upper or lower GI symptoms.  He does have a large ventral hernia and also reports feeling a tear in his mid lower abdomen a couple weeks ago when picking up a bag of groceries.  Still has mild pain in that area, but it is improving.  Of note, he has documented 18 pound weight loss in the last 2 months.  This is unintentional.  Patient reports he is eating normally. Abdominal exam with no significant tenderness to palpation.  Large ventral hernia reduces when patient lays down.  Proceed with TCS with propofol via ostomy and rectum with Dr. Jena Gauss as scheduled. The risks, benefits, and alternatives have been discussed in detail with patient. They have stated understanding and desire to proceed.  Propofol due to polypharmacy and drug use. Discussed with patient that he will have UDS at preop and if he test positive for cocaine, his procedure will be canceled.  He voices understanding and states he will refrain from cocaine use. For weight loss, he was advised to add boost daily.  If colonoscopy is unrevealing and weight continues to decline, would pursue CT.  Query whether drug use has played a role in weight loss. Follow-up after procedure.

## 2020-01-16 NOTE — Patient Instructions (Addendum)
We will proceed with colonoscopy as scheduled with Dr. Jena Gauss.   You will have a urine drug screen at pre-up.   Continue eating a well-balanced diet.  Start drinking boost daily.  Ermalinda Memos, PA-C [facility]

## 2020-01-16 NOTE — Assessment & Plan Note (Signed)
Addressed above

## 2020-01-16 NOTE — Assessment & Plan Note (Addendum)
Documented 18 pound weight loss over the last 2 months.  This is unintentional.  Patient reports eating 3 meals a day.  No significant upper or lower GI symptoms.  No bright red blood or black stools from ostomy site.  He does have history of cocaine use.  He is scheduled for colonoscopy in March 2021.  Query whether drug use has played a role in weight loss.  For now, he was advised to add boost daily and continue eating a well-balanced diet.  If weight continues to decline and colonoscopy is unrevealing, will pursue CT abdomen and pelvis.  Follow-up after colonoscopy.

## 2020-03-03 ENCOUNTER — Encounter: Payer: Self-pay | Admitting: *Deleted

## 2020-03-03 ENCOUNTER — Telehealth: Payer: Self-pay | Admitting: Internal Medicine

## 2020-03-03 NOTE — Telephone Encounter (Signed)
Called pt. He states he needs to put his procedure off for about 1-2 months. Reports he needs to gain some weight. He has been rescheduled to 5/27 at 12:00pm. Patient aware will mail new prep instructions with new pre-op/covid test appt. He voiced understanding.Marland Kitchen  LMOVM for endo making aware of change.

## 2020-03-03 NOTE — Telephone Encounter (Signed)
Patient called stating he wanted to put off his procedure

## 2020-03-11 ENCOUNTER — Encounter (HOSPITAL_COMMUNITY): Payer: PPO

## 2020-03-11 ENCOUNTER — Other Ambulatory Visit (HOSPITAL_COMMUNITY): Payer: PPO

## 2020-05-14 ENCOUNTER — Telehealth: Payer: Self-pay | Admitting: *Deleted

## 2020-05-14 ENCOUNTER — Encounter: Payer: Self-pay | Admitting: General Practice

## 2020-05-14 NOTE — Telephone Encounter (Signed)
Received a message from [place] and she states patient called her wanting to cancel procedure. He told her he had a sinus infections, then stated he had a stomach virus, and several other excuses as to why he was cancelling. Patient has cancelled procedure numerous times.   Called spoke with pt and confirmed he wants to cancel. He states he wants to put off for a while. He is aware unable to reschedule he will need OV as he has cancelled several times. He voiced understanding. FYI to RMR and KH.  Patient stated to schedule appt and mail letter.

## 2020-05-14 NOTE — Telephone Encounter (Signed)
Noted. This is the 4th time he has cancelled/rescheduled. The first back in October 2020 due to positive cocaine and 3 times since then for various reasons. Will let RMR and Camille weigh in on this.

## 2020-05-14 NOTE — Telephone Encounter (Signed)
Patient has been discharged from the practice

## 2020-05-14 NOTE — Telephone Encounter (Signed)
noted 

## 2020-05-19 ENCOUNTER — Other Ambulatory Visit (HOSPITAL_COMMUNITY): Payer: PPO

## 2020-05-19 ENCOUNTER — Encounter (HOSPITAL_COMMUNITY): Payer: PPO

## 2020-05-22 ENCOUNTER — Encounter (HOSPITAL_COMMUNITY): Admission: RE | Payer: Self-pay | Source: Home / Self Care

## 2020-05-22 ENCOUNTER — Ambulatory Visit (HOSPITAL_COMMUNITY): Admission: RE | Admit: 2020-05-22 | Payer: PPO | Source: Home / Self Care | Admitting: Internal Medicine

## 2020-05-22 SURGERY — COLONOSCOPY WITH PROPOFOL
Anesthesia: Monitor Anesthesia Care

## 2020-05-23 NOTE — Telephone Encounter (Signed)
Discharge letter mailed  

## 2020-07-10 DIAGNOSIS — Z933 Colostomy status: Secondary | ICD-10-CM | POA: Diagnosis not present

## 2020-07-17 ENCOUNTER — Encounter (HOSPITAL_COMMUNITY): Payer: Self-pay

## 2020-07-17 ENCOUNTER — Other Ambulatory Visit: Payer: Self-pay

## 2020-07-17 ENCOUNTER — Emergency Department (HOSPITAL_COMMUNITY)
Admission: EM | Admit: 2020-07-17 | Discharge: 2020-07-18 | Disposition: A | Discharge status: Home / Self Care | Payer: PPO | Attending: Emergency Medicine | Admitting: Emergency Medicine

## 2020-07-17 DIAGNOSIS — Z20822 Contact with and (suspected) exposure to covid-19: Secondary | ICD-10-CM | POA: Insufficient documentation

## 2020-07-17 DIAGNOSIS — F17210 Nicotine dependence, cigarettes, uncomplicated: Secondary | ICD-10-CM | POA: Insufficient documentation

## 2020-07-17 DIAGNOSIS — Z79899 Other long term (current) drug therapy: Secondary | ICD-10-CM | POA: Diagnosis not present

## 2020-07-17 DIAGNOSIS — F29 Unspecified psychosis not due to a substance or known physiological condition: Secondary | ICD-10-CM | POA: Insufficient documentation

## 2020-07-17 LAB — CBC WITH DIFFERENTIAL/PLATELET
Abs Immature Granulocytes: 0.02 10*3/uL (ref 0.00–0.07)
Basophils Absolute: 0 10*3/uL (ref 0.0–0.1)
Basophils Relative: 0 %
Eosinophils Absolute: 0.3 10*3/uL (ref 0.0–0.5)
Eosinophils Relative: 3 %
HCT: 35.7 % — ABNORMAL LOW (ref 39.0–52.0)
Hemoglobin: 12.2 g/dL — ABNORMAL LOW (ref 13.0–17.0)
Immature Granulocytes: 0 %
Lymphocytes Relative: 34 %
Lymphs Abs: 3.3 10*3/uL (ref 0.7–4.0)
MCH: 29.5 pg (ref 26.0–34.0)
MCHC: 34.2 g/dL (ref 30.0–36.0)
MCV: 86.4 fL (ref 80.0–100.0)
Monocytes Absolute: 0.6 10*3/uL (ref 0.1–1.0)
Monocytes Relative: 6 %
Neutro Abs: 5.7 10*3/uL (ref 1.7–7.7)
Neutrophils Relative %: 57 %
Platelets: 243 10*3/uL (ref 150–400)
RBC: 4.13 MIL/uL — ABNORMAL LOW (ref 4.22–5.81)
RDW: 13.2 % (ref 11.5–15.5)
WBC: 9.9 10*3/uL (ref 4.0–10.5)
nRBC: 0 % (ref 0.0–0.2)

## 2020-07-17 LAB — BASIC METABOLIC PANEL
Anion gap: 9 (ref 5–15)
BUN: 17 mg/dL (ref 6–20)
CO2: 26 mmol/L (ref 22–32)
Calcium: 8.7 mg/dL — ABNORMAL LOW (ref 8.9–10.3)
Chloride: 102 mmol/L (ref 98–111)
Creatinine, Ser: 0.89 mg/dL (ref 0.61–1.24)
GFR calc Af Amer: >60 mL/min (ref >60)
GFR calc non Af Amer: >60 mL/min (ref >60)
Glucose, Bld: 90 mg/dL (ref 70–99)
Potassium: 3.7 mmol/L (ref 3.5–5.1)
Sodium: 137 mmol/L (ref 135–145)

## 2020-07-17 LAB — ETHANOL: Alcohol, Ethyl (B): <10 mg/dL (ref <10)

## 2020-07-17 MED ORDER — LORAZEPAM 2 MG/ML IJ SOLN
2.0000 mg | Freq: Once | INTRAMUSCULAR | Status: AC
  Administered 2020-07-17: 2 mg via INTRAMUSCULAR
  Filled 2020-07-17: qty 1

## 2020-07-17 MED ORDER — DROPERIDOL 2.5 MG/ML IJ SOLN: 2.5000 mg | Freq: Once | INTRAMUSCULAR | Status: DC

## 2020-07-17 MED ORDER — ZIPRASIDONE MESYLATE 20 MG IM SOLR
INTRAMUSCULAR | Status: AC
  Filled 2020-07-17: qty 20

## 2020-07-17 MED ORDER — ZIPRASIDONE MESYLATE 20 MG IM SOLR
20.0000 mg | Freq: Once | INTRAMUSCULAR | Status: AC
  Administered 2020-07-17: 20 mg via INTRAMUSCULAR

## 2020-07-17 NOTE — ED Provider Notes (Signed)
[facility] EMERGENCY DEPARTMENT Provider Note   CSN: [id number] Arrival date & time: 07/17/20  1715     History Chief Complaint  Patient presents with  . V70.1    [name] [name] is a 54 y.o. male.  HPI   54yM with psychosis. Hx of bipolar. Brought in police after welfare check. He was talking to people that weren't there. Talking about people driving metal spikes through their heads and "we" killed them all. Here in the emergency room he keeps telling me I need to inject him with a "biostabilizer."   Past Medical History:  Diagnosis Date  . Anxiety   . Bipolar 1 disorder ([facility])   . Chronic fatigue   . Chronic pain   . Fibromyalgia     Patient Active Problem List   Diagnosis Date Noted  . Loss of weight 01/16/2020  . Colon cancer screening 01/16/2020  . Incisional hernia, without obstruction or gangrene 11/15/2019  . Colostomy in place [facility]) 09/27/2019  . Perforated rectum ([facility]) 08/03/2019  . Nasal obstruction 08/03/2019  . Fecal peritonitis ([facility]) 08/03/2019  . Foreign body of rectum   . Complete intestinal obstruction ([facility])   . Ileus, postoperative ([facility])   . Cellulitis 06/12/2017  . GASTROENTERITIS, ACUTE 01/22/2008  . FEVER UNSPECIFIED 11/10/2007  . DIZZINESS 11/06/2007  . HYPERLIPIDEMIA 04/26/2007  . DISORDER, BIPOLAR NOS 04/26/2007  . DEPRESSION 04/26/2007  . ALLERGIC RHINITIS 04/26/2007    Past Surgical History:  Procedure Laterality Date  . COLON RESECTION SIGMOID  08/03/2019   Procedure: COLON RESECTION SIGMOID;  Surgeon: Lucretia Roers, MD;  Location: [facility] ORS;  Service: General;;  . COLOSTOMY N/A 08/03/2019   Procedure: COLOSTOMY;  Surgeon: Lucretia Roers, MD;  Location: [facility] OR[facility];  Service: General;  Laterality: N/A;  . FLEXIBLE SIGMOIDOSCOPY N/A 08/03/2019   Procedure: FLEXIBLE SIGMOIDOSCOPY;  Surgeon: Corbin Ade, MD;  Location: [facility] ENDO SUITE;  Service: Endoscopy;  Laterality: N/A;  . KNEE ARTHROSCOPY Left 2006   miniscus tear  . LAPAROTOMY N/A  08/03/2019   Procedure: EXPLORATORY LAPAROTOMY;  Surgeon: Lucretia Roers, MD;  Location: [facility] ORS;  Service: General;  Laterality: N/A;  . LYMPH GLAND EXCISION Left 1983  . TONSILLECTOMY         Family History  Problem Relation Age of Onset  . Diabetes Mother   . Heart attack Father   . Diabetes Maternal Grandmother   . Colon cancer Neg Hx   . Colon polyps Neg Hx     Social History   Tobacco Use  . Smoking status: Current Every Day Smoker    Packs/day: 0.50    Years: 10.00    Pack years: 5.00  . Smokeless tobacco: Never Used  Vaping Use  . Vaping Use: Former  Substance Use Topics  . Alcohol use: Not Currently    Comment: occasional; denied 10/02/19  . Drug use: Not Currently    Types: Cocaine    Comment: last used in December 2020.     Home Medications Prior to Admission medications   Medication Sig Start Date End Date Taking? Authorizing Provider  acetaminophen (TYLENOL) 500 MG tablet Take 500 mg by mouth every 6 (six) hours as needed.    [provider]  amitriptyline (ELAVIL) 50 MG tablet Take 150-200 mg by mouth at bedtime.    [provider]  ARIPiprazole (ABILIFY) 5 MG tablet Take 5 mg by mouth at bedtime.    [provider]  clonazePAM (KLONOPIN) 2 MG tablet Take  2 mg by mouth 3 (three) times daily as needed for anxiety.    [provider]  DULoxetine (CYMBALTA) 60 MG capsule Take 60 mg by mouth 2 (two) times daily.  07/30/19   [provider]  gabapentin (NEURONTIN) 400 MG capsule Take 800 mg by mouth See admin instructions. Take 2 capsules (800 mg) by mouth twice daily; may take an additional 800 mg as needed for nerve pain. 10/30/14   [provider]  HYDROcodone-acetaminophen (NORCO) 7.5-325 MG tablet Take 1 tablet by mouth every 6 (six) hours as needed for moderate pain.     [provider]  meloxicam (MOBIC) 7.5 MG tablet Take 7.5 mg by mouth 2 (two) times daily.  09/03/19   [provider]    pravastatin (PRAVACHOL) 10 MG tablet Take 10 mg by mouth at bedtime.     [provider]    Allergies    Demerol [meperidine], Levaquin [levofloxacin in d5w], Morphine and related, Other, and Penicillins  Review of Systems   Review of Systems Level 5 caveat because he is psychotic.  Physical Exam Updated Vital Signs BP (!) 137/99 (BP Location: Right Arm)   Pulse 92   Temp 99.3 F (37.4 C) (Oral)   Resp 18   Ht 5\' 11"  (1.803 m)   Wt 86.2 kg   SpO2 97%   BMI 26.50 kg/m   Physical Exam Vitals and nursing note reviewed.  Constitutional:      Appearance: He is well-developed.     Comments: Disheveled appearance. Yelling. Erratic behavior.   HENT:     Head: Normocephalic.  Eyes:     General:        Right eye: No discharge.        Left eye: No discharge.     Conjunctiva/sclera: Conjunctivae normal.  Cardiovascular:     Rate and Rhythm: Normal rate and regular rhythm.     Heart sounds: Normal heart sounds. No murmur heard.  No friction rub. No gallop.   Pulmonary:     Effort: Pulmonary effort is normal. No respiratory distress.     Breath sounds: Normal breath sounds.  Abdominal:     General: There is no distension.     Palpations: Abdomen is soft.     Tenderness: There is no abdominal tenderness.  Musculoskeletal:        General: No tenderness.     Cervical back: Neck supple.  Skin:    General: Skin is warm and dry.  Psychiatric:     Comments: Yelling. Easily distracted. Cannot redirect.      ED Results / Procedures / Treatments   Labs (all labs ordered are listed, but only abnormal results are displayed) Labs Reviewed  CBC WITH DIFFERENTIAL/PLATELET - Abnormal; Notable for the following components:      Result Value   RBC 4.13 (*)    Hemoglobin 12.2 (*)    HCT 35.7 (*)    All other components within normal limits  BASIC METABOLIC PANEL - Abnormal; Notable for the following components:   Calcium 8.7 (*)    All other components within normal  limits  ETHANOL  RAPID URINE DRUG SCREEN, HOSP PERFORMED    EKG None  Radiology No results found.  Procedures Procedures (including critical care time)  Medications Ordered in ED Medications  LORazepam (ATIVAN) injection 2 mg (2 mg Intramuscular Given 07/17/20 1933)  ziprasidone (GEODON) injection 20 mg (20 mg Intramuscular Given 07/17/20 1931)    ED Course  I have  reviewed the triage vital signs and the nursing notes.  Pertinent labs & imaging results that were available during my care of the patient were reviewed by me and considered in my medical decision making (see chart for details).    MDM Rules/Calculators/A&P                          54yM that is acutely psychotic. Had to restrain. Will medically clear for TTS evaluation.  Final Clinical Impression(s) / ED Diagnoses Final diagnoses:  Psychosis, unspecified psychosis type [facility])    Rx / DC Orders ED Discharge Orders    None       Raeford Razor, MD 07/20/20 [phone]
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
## 2020-07-18 DIAGNOSIS — F1999 Other psychoactive substance use, unspecified with unspecified psychoactive substance-induced disorder: Secondary | ICD-10-CM

## 2020-07-18 LAB — RAPID URINE DRUG SCREEN, HOSP PERFORMED
Amphetamines: POSITIVE — AB
Barbiturates: NOT DETECTED
Benzodiazepines: POSITIVE — AB
Cocaine: POSITIVE — AB
Opiates: POSITIVE — AB
Tetrahydrocannabinol: NOT DETECTED

## 2020-07-18 MED ORDER — GABAPENTIN 400 MG PO CAPS: 800.0000 mg | ORAL_CAPSULE | ORAL | Status: DC

## 2020-07-18 MED ORDER — PRAVASTATIN SODIUM 10 MG PO TABS
10.0000 mg | ORAL_TABLET | Freq: Every day | ORAL | Status: DC
  Filled 2020-07-18 (×4): qty 1

## 2020-07-18 MED ORDER — DULOXETINE HCL 30 MG PO CPEP
60.0000 mg | ORAL_CAPSULE | Freq: Two times a day (BID) | ORAL | Status: DC
  Administered 2020-07-18: 60 mg via ORAL
  Filled 2020-07-18: qty 2

## 2020-07-18 MED ORDER — ARIPIPRAZOLE 5 MG PO TABS: 5.0000 mg | ORAL_TABLET | Freq: Every day | ORAL | Status: DC
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
## 2020-07-18 NOTE — [facility] Counselor (Signed)
This counselor contacted [facility] ED to notify of Dr. Remus Blake disposition. RN reported wife had concerns about him not taking medication as well as patient displayed some disorganization in the hospital.  This counselor discussed with Dr. Lucianne Muss, [facility] medical director, who assessed patient. This patient continues to not meet meet in patient criteria as patient is not suicidal, homicidal, or psychotic. She states patient is separated from wife and they have not lived together for 3 years.   This counselor attempted to reach [facility] ED to update. TTS on hold for several minutes. Will call RN back at a later time to update.  Per Dr. Lucianne Muss patient continues to be psych cleared for discharge.

## 2020-07-18 NOTE — Discharge Instructions (Addendum)
Follow up as instructed by behavior health 

## 2020-07-18 NOTE — ED Notes (Addendum)
IVC paperwork faxed to [facility] 

## 2020-07-18 NOTE — BH Assessment (Signed)
Received TTS consult request. Spoke to RN who said Pt was given medication and is currently unable to participate in assessment. Pt will be assessed when he is alert.   Pamalee Leyden, [facility], [facility] Triage Specialist [phone]

## 2020-07-18 NOTE — BH Assessment (Signed)
Attempted to complete TTS assessment. Pt continues to remain medicated and unable to participate in assessment, per RN and Geographical information systems officer. Pt will be assessed when he is alert.

## 2020-08-25 DIAGNOSIS — Z933 Colostomy status: Secondary | ICD-10-CM | POA: Diagnosis not present

## 2020-10-03 IMAGING — DX ABDOMEN - 1 VIEW
1 series · 1 of 1 positions shown · non-contrast
Comparison: None.

CLINICAL DATA: Vibrator in rectum

EXAM:
ABDOMEN - 1 VIEW

[abdomen kub]
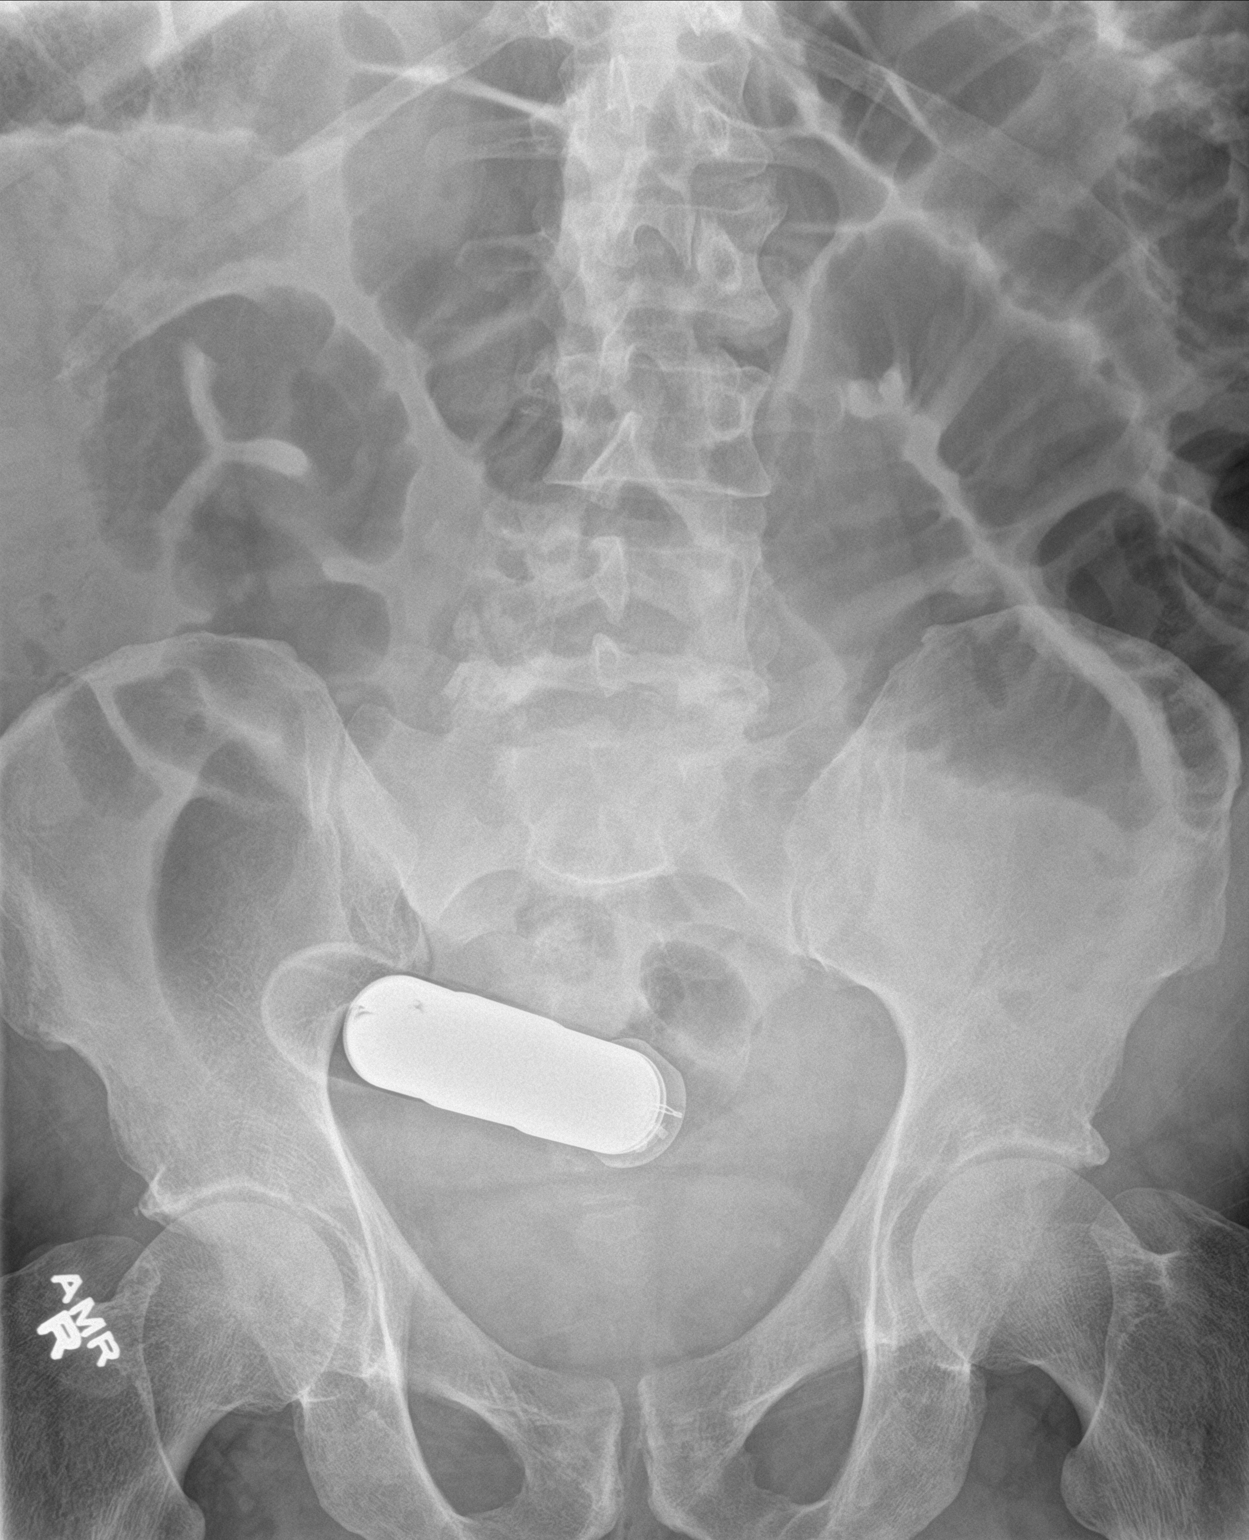

[1 of 1 positions shown; findings below may reference images not displayed]

FINDINGS: Radiopaque foreign body consistent with vibrator is currently in the
distal sigmoid colon. No free air is seen on this supine
examination. There is generalized bowel dilatation which may be
indicative of either ileus or distal partial bowel obstruction.
IMPRESSION: 1. Vibrator in distal sigmoid colon. No free air apparent on this
supine examination.

2. Multiple loops of dilated bowel. Suspect ileus, although a degree
of distal partial bowel obstruction cannot be excluded on this
single supine examination.

## 2020-10-08 DIAGNOSIS — Z23 Encounter for immunization: Secondary | ICD-10-CM | POA: Diagnosis not present

## 2020-10-08 DIAGNOSIS — F312 Bipolar disorder, current episode manic severe with psychotic features: Secondary | ICD-10-CM | POA: Diagnosis not present

## 2020-11-23 DIAGNOSIS — K9400 Colostomy complication, unspecified: Secondary | ICD-10-CM | POA: Diagnosis not present

## 2020-12-04 ENCOUNTER — Encounter: Payer: Self-pay | Admitting: Internal Medicine

## 2020-12-04 ENCOUNTER — Other Ambulatory Visit: Payer: Self-pay

## 2020-12-04 ENCOUNTER — Ambulatory Visit (HOSPITAL_COMMUNITY)
Admission: RE | Admit: 2020-12-04 | Discharge: 2020-12-04 | Disposition: A | Discharge status: Home / Self Care | Payer: PPO | Source: Ambulatory Visit | Attending: Internal Medicine | Admitting: Internal Medicine

## 2020-12-04 ENCOUNTER — Ambulatory Visit (INDEPENDENT_AMBULATORY_CARE_PROVIDER_SITE_OTHER): Payer: PPO | Admitting: Internal Medicine

## 2020-12-04 VITALS — BP 141/81 | HR 88 | Temp 97.4°F | Resp 18 | Ht 71.0 in | Wt 177.0 lb

## 2020-12-04 DIAGNOSIS — R110 Nausea: Secondary | ICD-10-CM

## 2020-12-04 DIAGNOSIS — R42 Dizziness and giddiness: Secondary | ICD-10-CM | POA: Diagnosis not present

## 2020-12-04 DIAGNOSIS — F3160 Bipolar disorder, current episode mixed, unspecified: Secondary | ICD-10-CM | POA: Diagnosis not present

## 2020-12-04 DIAGNOSIS — Z7689 Persons encountering health services in other specified circumstances: Secondary | ICD-10-CM | POA: Diagnosis not present

## 2020-12-04 DIAGNOSIS — G894 Chronic pain syndrome: Secondary | ICD-10-CM

## 2020-12-04 DIAGNOSIS — R519 Headache, unspecified: Secondary | ICD-10-CM | POA: Diagnosis not present

## 2020-12-04 DIAGNOSIS — Z720 Tobacco use: Secondary | ICD-10-CM

## 2020-12-04 DIAGNOSIS — Z87898 Personal history of other specified conditions: Secondary | ICD-10-CM | POA: Diagnosis not present

## 2020-12-04 DIAGNOSIS — Z933 Colostomy status: Secondary | ICD-10-CM | POA: Diagnosis not present

## 2020-12-04 DIAGNOSIS — S0990XA Unspecified injury of head, initial encounter: Secondary | ICD-10-CM

## 2020-12-04 DIAGNOSIS — F17210 Nicotine dependence, cigarettes, uncomplicated: Secondary | ICD-10-CM | POA: Diagnosis not present

## 2020-12-04 DIAGNOSIS — I10 Essential (primary) hypertension: Secondary | ICD-10-CM | POA: Diagnosis not present

## 2020-12-04 DIAGNOSIS — R410 Disorientation, unspecified: Secondary | ICD-10-CM | POA: Diagnosis not present

## 2020-12-04 MED ORDER — ONDANSETRON 4 MG PO TBDP: 4.0000 mg | ORAL_TABLET | Freq: Three times a day (TID) | ORAL | 0 refills | Status: DC | PRN

## 2020-12-04 MED ORDER — MELOXICAM 7.5 MG PO TABS: 7.5000 mg | ORAL_TABLET | Freq: Two times a day (BID) | ORAL | 2 refills | Status: DC

## 2020-12-04 MED ORDER — GABAPENTIN 400 MG PO CAPS: 400.0000 mg | ORAL_CAPSULE | Freq: Four times a day (QID) | ORAL | 1 refills | Status: DC

## 2020-12-04 MED ORDER — CYCLOBENZAPRINE HCL 10 MG PO TABS: 10.0000 mg | ORAL_TABLET | Freq: Three times a day (TID) | ORAL | 1 refills | Status: DC

## 2020-12-04 MED ORDER — DULOXETINE HCL 60 MG PO CPEP: 60.0000 mg | ORAL_CAPSULE | Freq: Two times a day (BID) | ORAL | 5 refills | Status: DC
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
## 2020-12-04 NOTE — Assessment & Plan Note (Addendum)
BP Readings from Last 1 Encounters:  12/04/20 (!) 141/81   New-onset, could be elevated in the setting of nervousness related a new place Advised to check BP at home and/or pharmacy Advised DASH diet and moderate exercise/walking, at least 150 mins/week

## 2020-12-04 NOTE — Assessment & Plan Note (Signed)
Care established Previous chart reviewed History and medications reviewed with the patient 
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
## 2020-12-04 NOTE — Assessment & Plan Note (Signed)
Has perforated rectum due to foreign body, s/p partial colectomy Currently has colostomy in place, has not seen General surgeon for last few months Referred to General surgeon

## 2020-12-04 NOTE — Assessment & Plan Note (Signed)
Asked about quitting: confirms that she currently smokes cigarettes Advise to quit smoking: Educated about QUITTING to reduce the risk of cancer, cardio and cerebrovascular disease. Assess willingness: Unwilling to quit at this time, but is working on cutting back. Assist with counseling and pharmacotherapy: Counseled for 5 minutes and literature provided. Arrange for follow up: Follow up in 3 months and continue to offer help. 

## 2020-12-04 NOTE — Assessment & Plan Note (Signed)
H/o marijuana and cocaine use States that he has not been using them. Advised to stay away from illicit drugs.

## 2020-12-04 NOTE — Patient Instructions (Addendum)
You are being scheduled to get CT head for head injury.  You are being referred to Dr Gerilyn Pilgrim for chronic pain management.  Please take medications as prescribed. Please follow low salt diet and perform moderate exercise/walking at least 150 mins/week.  Please schedule an appointment with Dr Evelene Croon for follow up of Bipolar disorder.  [facility] [address] [place], [place] [address] [phone]

## 2020-12-04 NOTE — Assessment & Plan Note (Signed)
On Clonazepam Used to see Dr Evelene Croon, referral made.
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
## 2021-01-06 ENCOUNTER — Other Ambulatory Visit: Payer: Self-pay

## 2021-01-06 ENCOUNTER — Encounter: Payer: Self-pay | Admitting: General Surgery

## 2021-01-06 ENCOUNTER — Ambulatory Visit (INDEPENDENT_AMBULATORY_CARE_PROVIDER_SITE_OTHER): Payer: PPO | Admitting: General Surgery

## 2021-01-06 VITALS — BP 152/90 | HR 90 | Temp 98.1°F | Resp 18 | Ht 71.0 in | Wt 180.0 lb

## 2021-01-06 DIAGNOSIS — K432 Incisional hernia without obstruction or gangrene: Secondary | ICD-10-CM

## 2021-01-06 DIAGNOSIS — Z933 Colostomy status: Secondary | ICD-10-CM

## 2021-01-06 DIAGNOSIS — F22 Delusional disorders: Secondary | ICD-10-CM

## 2021-01-06 NOTE — Patient Instructions (Signed)
Continue colostomy care. Get Belt for hernia. Can bind with ACE wrap if needed too.   Need colonoscopy.  Reversal is not an option right now due to the small stump and hernia.   Will call your mom to discuss issues.
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
## 2021-01-15 DIAGNOSIS — Z933 Colostomy status: Secondary | ICD-10-CM | POA: Diagnosis not present

## 2021-02-13 ENCOUNTER — Other Ambulatory Visit: Payer: Self-pay | Admitting: Internal Medicine

## 2021-02-13 DIAGNOSIS — G894 Chronic pain syndrome: Secondary | ICD-10-CM

## 2021-02-27 DIAGNOSIS — Z933 Colostomy status: Secondary | ICD-10-CM | POA: Diagnosis not present

## 2021-03-04 ENCOUNTER — Encounter: Payer: Self-pay | Admitting: Internal Medicine

## 2021-03-04 ENCOUNTER — Telehealth: Payer: Self-pay

## 2021-03-04 ENCOUNTER — Ambulatory Visit (INDEPENDENT_AMBULATORY_CARE_PROVIDER_SITE_OTHER): Payer: PPO | Admitting: Internal Medicine

## 2021-03-04 ENCOUNTER — Other Ambulatory Visit: Payer: Self-pay

## 2021-03-04 VITALS — BP 154/92 | HR 75 | Temp 97.9°F | Resp 18 | Ht 71.0 in | Wt 192.1 lb

## 2021-03-04 DIAGNOSIS — F3113 Bipolar disorder, current episode manic without psychotic features, severe: Secondary | ICD-10-CM | POA: Diagnosis not present

## 2021-03-04 DIAGNOSIS — G894 Chronic pain syndrome: Secondary | ICD-10-CM | POA: Diagnosis not present

## 2021-03-04 DIAGNOSIS — Z933 Colostomy status: Secondary | ICD-10-CM | POA: Diagnosis not present

## 2021-03-04 DIAGNOSIS — I10 Essential (primary) hypertension: Secondary | ICD-10-CM

## 2021-03-04 MED ORDER — GABAPENTIN 400 MG PO CAPS: ORAL_CAPSULE | ORAL | 0 refills | Status: DC

## 2021-03-04 MED ORDER — DULOXETINE HCL 60 MG PO CPEP: 60.0000 mg | ORAL_CAPSULE | Freq: Every day | ORAL | 5 refills | Status: DC

## 2021-03-04 MED ORDER — CYCLOBENZAPRINE HCL 10 MG PO TABS: 10.0000 mg | ORAL_TABLET | Freq: Two times a day (BID) | ORAL | 1 refills | Status: DC | PRN
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
## 2021-03-04 NOTE — Progress Notes (Signed)
Established Patient Office Visit  Subjective:  Patient ID: [name] [name], male    DOB: 1966-09-15  Age: 55 y.o. MRN: [id number]  CC:  Chief Complaint  Patient presents with  . Follow-up    Follow up pt has been in a lot of pain has had fever for the last three days and has been really fatigued the last few days also     HPI [name] [name] is a 55 year old male with PMH of bipolar disorder, chronic pain syndrome, polysubstance abuse, and perforated rectum s/p partial colectomy and colostomy who presents for follow up of his chronic medical conditions.  Bipolar disorder: He appears to have delusions and hallucinations. He states that he has been talking to [employer], Irene Shipper and other singers. He shows two small spots of erythema and states that he had snake bite recently, but unable to provide any detail He quickly shifts his conversation to other topics. He sold his house and moved to apartment and paid full-year rent upfront. He states he has a girlfriend who lives in [place], [place]. He shows his new phone and tries to contact his old Therapist, sports office during conversation.  Chronic pain: He had to visit Dr Gerilyn Pilgrim for pain management, but he states that he had caught up with other stuff and could not follow up. He has been taking Gabapentin, but has run out of Cymbalta. He c/o diffuse pain all over his body. He states he had fever, but then states his temperature was 99.5 F. Denies any chills, nausea, vomiting, cough, nasal congestion or sore throat.  He states that he had COVID vaccines, but does not have card of it.  Past Medical History:  Diagnosis Date  . Anxiety   . Bipolar 1 disorder (HCC)   . Chronic fatigue   . Chronic pain   . Complete intestinal obstruction (HCC)   . Depression    Phreesia 12/03/2020  . Fecal peritonitis (HCC) 08/03/2019  . Fibromyalgia   . Hyperlipidemia    Phreesia 12/03/2020  . Ileus, postoperative (HCC)   . Perforated rectum (HCC) 08/03/2019     Past Surgical History:  Procedure Laterality Date  . COLON RESECTION SIGMOID  08/03/2019   Procedure: COLON RESECTION SIGMOID;  Surgeon: Lucretia Roers, MD;  Location: [facility];  Service: General;;  . COLOSTOMY N/A 08/03/2019   Procedure: COLOSTOMY;  Surgeon: Lucretia Roers, MD;  Location: [facility];  Service: General;  Laterality: N/A;  . FLEXIBLE SIGMOIDOSCOPY N/A 08/03/2019   Procedure: FLEXIBLE SIGMOIDOSCOPY;  Surgeon: Corbin Ade, MD;  Location: [facility];  Service: Endoscopy;  Laterality: N/A;  . KNEE ARTHROSCOPY Left 2006   miniscus tear  . LAPAROTOMY N/A 08/03/2019   Procedure: EXPLORATORY LAPAROTOMY;  Surgeon: Lucretia Roers, MD;  Location: [facility];  Service: General;  Laterality: N/A;  . LYMPH GLAND EXCISION Left 1983  . SMALL INTESTINE SURGERY N/A    Phreesia 12/03/2020  . TONSILLECTOMY      Family History  Problem Relation Age of Onset  . Diabetes Mother   . Heart attack Father   . Diabetes Maternal Grandmother   . Colon cancer Neg Hx   . Colon polyps Neg Hx     Social History   Socioeconomic History  . Marital status: Legally Separated    Spouse name: Not on file  . Number of children: Not on file  . Years of education: Not on file  . Highest education level: Not on file  Occupational  History  . Not on file  Tobacco Use  . Smoking status: Current Every Day Smoker    Packs/day: 0.50    Years: 10.00    Pack years: 5.00  . Smokeless tobacco: Never Used  Vaping Use  . Vaping Use: Former  Substance and Sexual Activity  . Alcohol use: Not Currently    Comment: occasional; denied 10/02/19  . Drug use: Not Currently    Types: Cocaine    Comment: last used in December 2020.   Marland Kitchen Sexual activity: Not Currently  Other Topics Concern  . Not on file  Social History Narrative  . Not on file   Social Determinants of Health   Financial Resource Strain: Not on file  Food Insecurity: Not on file  Transportation Needs: Not on file  Physical Activity:  Not on file  Stress: Not on file  Social Connections: Not on file  Intimate Partner Violence: Not on file    Outpatient Medications Prior to Visit  Medication Sig Dispense Refill  . clonazePAM (KLONOPIN) 2 MG tablet Take 2 mg by mouth 3 (three) times daily as needed for anxiety.    . meloxicam (MOBIC) 7.5 MG tablet Take 1 tablet (7.5 mg total) by mouth 2 (two) times daily. 60 tablet 2  . ondansetron (ZOFRAN-ODT) 4 MG disintegrating tablet Take 1 tablet (4 mg total) by mouth every 8 (eight) hours as needed for nausea or vomiting. 20 tablet 0  . zolpidem (AMBIEN) 10 MG tablet Take 10 mg by mouth daily as needed.    . cyclobenzaprine (FLEXERIL) 10 MG tablet Take 1 tablet (10 mg total) by mouth 3 (three) times daily. 60 tablet 1  . DULoxetine (CYMBALTA) 60 MG capsule Take 1 capsule (60 mg total) by mouth 2 (two) times daily. 60 capsule 5  . gabapentin (NEURONTIN) 400 MG capsule TAKE (1) CAPSULE BY MOUTH FOUR TIMES DAILY. 120 capsule 0  . HYDROcodone-acetaminophen (NORCO) 7.5-325 MG tablet Take 1 tablet by mouth every 6 (six) hours as needed for moderate pain.     No facility-administered medications prior to visit.    Allergies  Allergen Reactions  . Codeine Shortness Of Breath and Swelling  . Divalproex Sodium Diarrhea, Itching, Rash, Shortness Of Breath and Swelling  . Erythromycin Hives, Itching, Rash and Swelling  . Iodinated Diagnostic Agents Swelling  . Latex Itching and Rash  . Sulfa Antibiotics Diarrhea, Itching, Rash, Shortness Of Breath and Swelling  . Wheat Bran Hives, Itching, Rash, Shortness Of Breath and Swelling  . Azithromycin   . Demerol [Meperidine]   . Levaquin [Levofloxacin In D5w]   . Levofloxacin   . Meperidine Hcl   . Morphine   . Morphine And Related   . Other     arythromycin  . Penicillin G   . Penicillins     REACTION: Rash and facial swelling at age 55    ROS Review of Systems  Constitutional: Negative for chills and fever.  HENT: Negative for  congestion and sore throat.   Eyes: Negative for pain and discharge.  Respiratory: Negative for cough and shortness of breath.   Cardiovascular: Negative for chest pain and palpitations.  Gastrointestinal: Negative for constipation, diarrhea, nausea and vomiting.  Endocrine: Negative for polydipsia and polyuria.  Genitourinary: Negative for dysuria and hematuria.  Musculoskeletal: Positive for myalgias. Negative for neck pain and neck stiffness.  Skin: Negative for rash.  Neurological: Negative for dizziness, weakness, numbness and headaches.  Psychiatric/Behavioral: Positive for decreased concentration and sleep disturbance. Negative for  agitation and behavioral problems. The patient is nervous/anxious and is hyperactive.       Objective:    Physical Exam Vitals reviewed.  Constitutional:      General: He is not in acute distress.    Appearance: He is not diaphoretic.  HENT:     Head: Normocephalic.     Nose: Nose normal.     Mouth/Throat:     Mouth: Mucous membranes are moist.  Eyes:     General: No scleral icterus.    Extraocular Movements: Extraocular movements intact.     Pupils: Pupils are equal, round, and reactive to light.  Cardiovascular:     Rate and Rhythm: Normal rate and regular rhythm.     Pulses: Normal pulses.     Heart sounds: No murmur heard.   Pulmonary:     Breath sounds: Normal breath sounds. No wheezing or rales.  Abdominal:     Palpations: Abdomen is soft.     Tenderness: There is no abdominal tenderness.  Musculoskeletal:     Cervical back: Neck supple. No tenderness.     Right lower leg: No edema.     Left lower leg: No edema.  Skin:    General: Skin is warm.     Findings: No rash.  Neurological:     General: No focal deficit present.     Mental Status: He is alert and oriented to person, place, and time.     Sensory: No sensory deficit.     Motor: No weakness.  Psychiatric:        Mood and Affect: Mood is anxious.        Speech: Speech  is tangential.        Behavior: Behavior is hyperactive.        Thought Content: Thought content is delusional. Thought content does not include homicidal or suicidal ideation.     Comments: Thinks he has DNA substance (??) running under his left forearm, which was injected by [employer].     BP (!) 154/92 (BP Location: Right Arm, Patient Position: Sitting, Cuff Size: Normal)   Pulse 75   Temp 97.9 F (36.6 C) (Oral)   Resp 18   Ht 5\' 11"  (1.803 m)   Wt 192 lb 1.9 oz (87.1 kg)   SpO2 98%   BMI 26.80 kg/m  Wt Readings from Last 3 Encounters:  03/04/21 192 lb 1.9 oz (87.1 kg)  01/06/21 180 lb (81.6 kg)  12/04/20 177 lb (80.3 kg)     Health Maintenance Due  Topic Date Due  . COVID-19 Vaccine (1) Never done  . TETANUS/TDAP  Never done  . COLONOSCOPY (Pts 45-20yrs Insurance coverage will need to be confirmed)  Never done    There are no preventive care reminders to display for this patient.  Lab Results  Component Value Date   TSH 1.733 04/28/2007   Lab Results  Component Value Date   WBC 9.9 07/17/2020   HGB 12.2 (L) 07/17/2020   HCT 35.7 (L) 07/17/2020   MCV 86.4 07/17/2020   PLT 243 07/17/2020   Lab Results  Component Value Date   NA 137 07/17/2020   K 3.7 07/17/2020   CO2 26 07/17/2020   GLUCOSE 90 07/17/2020   BUN 17 07/17/2020   CREATININE 0.89 07/17/2020   BILITOT 0.8 09/20/2019   ALKPHOS 80 09/20/2019   AST 14 (L) 09/20/2019   ALT 14 09/20/2019   PROT 7.1 09/20/2019   ALBUMIN 3.8 09/20/2019   CALCIUM 8.7 (  L) 07/17/2020   ANIONGAP 9 07/17/2020   Lab Results  Component Value Date   CHOL 187 04/28/2007   Lab Results  Component Value Date   HDL 36 (L) 04/28/2007   Lab Results  Component Value Date   LDLCALC 100 (H) 04/28/2007   Lab Results  Component Value Date   TRIG 253 (H) 04/28/2007   Lab Results  Component Value Date   CHOLHDL 5.2 Ratio 04/28/2007   No results found for: HGBA1C    Assessment & Plan:   Problem List Items Addressed  This Visit      Cardiovascular and Mediastinum   BP (high blood pressure)    BP Readings from Last 1 Encounters:  03/04/21 (!) 154/92   Appears to be related to uncontrolled anxiety, needs to see a Psychiatrist Advised DASH diet and moderate exercise/walking, at least 150 mins/week       Relevant Orders   CBC   Basic Metabolic Panel (BMET)   Hemoglobin A1c   Lipid Profile     Other   DISORDER, BIPOLAR NOS - Primary    Appears to be in hypomania currently Was on Clonazepam in the past, but states that he has been sleeping too much now. Has delusions and auditory and/or visual hallucinations Has not been able to follow up with Psychiatry, will try to find a Psychiatry provider for him.      Colostomy in place [facility](HCC)    Had perforated rectum due to foreign body, s/p partial colectomy Currently has colostomy in place, saw General surgeon - needs colonoscopy before proceeding for colostomy reversal Could not find GI who was willing to perform colonoscopy for him considering his Psychiatric and social history, will make GI referral later once his Psychiatric condition is stable      Chronic pain syndrome    Refilled Gabapentin and Cymbalta Needs to follow up with Dr [place]n [place]      Relevant Medications   DULoxetine (CYMBALTA) 60 MG capsule   gabapentin (NEURONTIN) 400 MG capsule   cyclobenzaprine (FLEXERIL) 10 MG tablet   Other Relevant Orders   Drug Screen, Urine      I tried to contact patient's mother at number provided by the patient to discuss his situation, unable to reach - left VM.   Meds ordered this encounter  Medications  . DULoxetine (CYMBALTA) 60 MG capsule    Sig: Take 1 capsule (60 mg total) by mouth daily.    Dispense:  60 capsule    Refill:  5  . gabapentin (NEURONTIN) 400 MG capsule    Sig: TAKE (1) CAPSULE BY MOUTH FOUR TIMES DAILY.    Dispense:  120 capsule    Refill:  0  . cyclobenzaprine (FLEXERIL) 10 MG tablet    Sig: Take 1 tablet (10 mg  total) by mouth 2 (two) times daily as needed for muscle spasms.    Dispense:  60 tablet    Refill:  1    Follow-up: Return in about 4 months (around 07/04/2021).    Anabel Halonutwik K Trista Ciocca, MD

## 2021-03-04 NOTE — Assessment & Plan Note (Signed)
Refilled Gabapentin and Cymbalta Needs to follow up with Dr Gerilyn Pilgrim

## 2021-03-04 NOTE — Assessment & Plan Note (Signed)
BP Readings from Last 1 Encounters:  03/04/21 (!) 154/92   Appears to be related to uncontrolled anxiety, needs to see a Psychiatrist Advised DASH diet and moderate exercise/walking, at least 150 mins/week

## 2021-03-04 NOTE — Patient Instructions (Addendum)
You are being referred to Psychiatry.  Please contact [facility] for pain management.  Please avoid taking Flexeril on daily basis. Please take it only for muscle spasms.

## 2021-03-04 NOTE — Assessment & Plan Note (Signed)
Appears to be in hypomania currently Was on Clonazepam in the past, but states that he has been sleeping too much now. Has delusions and auditory and/or visual hallucinations Has not been able to follow up with Psychiatry, will try to find a Psychiatry provider for him.

## 2021-03-04 NOTE — Telephone Encounter (Signed)
Pt is returning call.  

## 2021-03-04 NOTE — Telephone Encounter (Signed)
I did not cal patient unsure to what he is referring to

## 2021-03-05 LAB — BASIC METABOLIC PANEL
BUN/Creatinine Ratio: 16 (ref 9–20)
BUN: 15 mg/dL (ref 6–24)
CO2: 21 mmol/L (ref 20–29)
Calcium: 9.5 mg/dL (ref 8.7–10.2)
Chloride: 99 mmol/L (ref 96–106)
Creatinine, Ser: 0.94 mg/dL (ref 0.76–1.27)
Glucose: 95 mg/dL (ref 65–99)
Potassium: 4.7 mmol/L (ref 3.5–5.2)
Sodium: 140 mmol/L (ref 134–144)
eGFR: 96 mL/min/{1.73_m2} (ref >59)

## 2021-03-05 LAB — CBC
Hematocrit: 42.2 % (ref 37.5–51.0)
Hemoglobin: 14.6 g/dL (ref 13.0–17.7)
MCH: 29.7 pg (ref 26.6–33.0)
MCHC: 34.6 g/dL (ref 31.5–35.7)
MCV: 86 fL (ref 79–97)
Platelets: 244 10*3/uL (ref 150–450)
RBC: 4.92 x10E6/uL (ref 4.14–5.80)
RDW: 13.3 % (ref 11.6–15.4)
WBC: 9.6 10*3/uL (ref 3.4–10.8)

## 2021-03-05 LAB — LIPID PANEL
Chol/HDL Ratio: 4 ratio (ref 0.0–5.0)
Cholesterol, Total: 198 mg/dL (ref 100–199)
HDL: 50 mg/dL (ref >39)
LDL Chol Calc (NIH): 111 mg/dL — ABNORMAL HIGH (ref 0–99)
Triglycerides: 212 mg/dL — ABNORMAL HIGH (ref 0–149)
VLDL Cholesterol Cal: 37 mg/dL (ref 5–40)

## 2021-03-05 LAB — HEMOGLOBIN A1C
Est. average glucose Bld gHb Est-mCnc: 120 mg/dL
Hgb A1c MFr Bld: 5.8 % — ABNORMAL HIGH (ref 4.8–5.6)

## 2021-03-06 LAB — DRUG SCREEN, URINE
Amphetamines, Urine: NEGATIVE ng/mL
Barbiturate screen, urine: NEGATIVE ng/mL
Benzodiazepine Quant, Ur: NEGATIVE ng/mL
Cannabinoid Quant, Ur: NEGATIVE ng/mL
Cocaine (Metab.): NEGATIVE ng/mL
Opiate Quant, Ur: NEGATIVE ng/mL
PCP Quant, Ur: NEGATIVE ng/mL

## 2021-03-19 ENCOUNTER — Telehealth: Payer: Self-pay

## 2021-03-19 ENCOUNTER — Ambulatory Visit (HOSPITAL_COMMUNITY)
Admission: EM | Admit: 2021-03-19 | Discharge: 2021-03-19 | Disposition: A | Discharge status: Home / Self Care | Payer: PPO

## 2021-03-19 ENCOUNTER — Other Ambulatory Visit: Payer: Self-pay

## 2021-03-19 NOTE — Telephone Encounter (Signed)
We had referred him to Dr Evelene Croon. If they do not accept, he needs to be referred to a different Psychiatrist.

## 2021-03-19 NOTE — Telephone Encounter (Signed)
Patients mother came in and gave a copy of a note from dr Evelene Croon patients psychiatry doctor she states that he needed to be voluntary committed at behavioral health in [place] but patient refused his mother is looking for any advice of somewhere local she could take him for help  she states hes been off his medications for 2 years now and is rapidly getting worse I have put the copy of the note in the box for patel for him to review ph# [phone]

## 2021-03-20 ENCOUNTER — Other Ambulatory Visit: Payer: Self-pay

## 2021-03-20 DIAGNOSIS — F3113 Bipolar disorder, current episode manic without psychotic features, severe: Secondary | ICD-10-CM

## 2021-03-20 NOTE — Telephone Encounter (Signed)
Referral to a new psych and copy given to [facility] to follow up on since urgent

## 2021-03-23 NOTE — Telephone Encounter (Signed)
Not sure what to tell her to do (i'm not familiar with him) How would she get him involuntarily committed?

## 2021-03-23 NOTE — Telephone Encounter (Signed)
Mom Talbert Forest lvm that she still needs assistance with getting help for her son

## 2021-03-23 NOTE — Telephone Encounter (Signed)
Lvm to contact.

## 2021-04-06 ENCOUNTER — Other Ambulatory Visit: Payer: Self-pay

## 2021-04-06 ENCOUNTER — Encounter (HOSPITAL_COMMUNITY): Payer: Self-pay | Admitting: Emergency Medicine

## 2021-04-06 ENCOUNTER — Emergency Department (HOSPITAL_COMMUNITY)
Admission: EM | Admit: 2021-04-06 | Discharge: 2021-04-07 | Disposition: A | Discharge status: Home / Self Care | Payer: PPO | Attending: Emergency Medicine | Admitting: Emergency Medicine

## 2021-04-06 DIAGNOSIS — Z046 Encounter for general psychiatric examination, requested by authority: Secondary | ICD-10-CM | POA: Insufficient documentation

## 2021-04-06 DIAGNOSIS — F309 Manic episode, unspecified: Secondary | ICD-10-CM | POA: Insufficient documentation

## 2021-04-06 DIAGNOSIS — F209 Schizophrenia, unspecified: Secondary | ICD-10-CM | POA: Diagnosis not present

## 2021-04-06 DIAGNOSIS — F1410 Cocaine abuse, uncomplicated: Secondary | ICD-10-CM | POA: Diagnosis not present

## 2021-04-06 DIAGNOSIS — F17210 Nicotine dependence, cigarettes, uncomplicated: Secondary | ICD-10-CM | POA: Insufficient documentation

## 2021-04-06 DIAGNOSIS — Z91040 Latex allergy status: Secondary | ICD-10-CM | POA: Insufficient documentation

## 2021-04-06 DIAGNOSIS — Z01818 Encounter for other preprocedural examination: Secondary | ICD-10-CM | POA: Insufficient documentation

## 2021-04-06 DIAGNOSIS — F419 Anxiety disorder, unspecified: Secondary | ICD-10-CM | POA: Diagnosis not present

## 2021-04-06 DIAGNOSIS — Z79899 Other long term (current) drug therapy: Secondary | ICD-10-CM | POA: Diagnosis not present

## 2021-04-06 DIAGNOSIS — Z20822 Contact with and (suspected) exposure to covid-19: Secondary | ICD-10-CM | POA: Diagnosis not present

## 2021-04-06 LAB — COMPREHENSIVE METABOLIC PANEL
ALT: 21 U/L (ref 0–44)
AST: 22 U/L (ref 15–41)
Albumin: 4.3 g/dL (ref 3.5–5.0)
Alkaline Phosphatase: 79 U/L (ref 38–126)
Anion gap: 13 (ref 5–15)
BUN: 14 mg/dL (ref 6–20)
CO2: 23 mmol/L (ref 22–32)
Calcium: 9.1 mg/dL (ref 8.9–10.3)
Chloride: 100 mmol/L (ref 98–111)
Creatinine, Ser: 0.88 mg/dL (ref 0.61–1.24)
GFR, Estimated: >60 mL/min (ref >60)
Glucose, Bld: 112 mg/dL — ABNORMAL HIGH (ref 70–99)
Potassium: 4.2 mmol/L (ref 3.5–5.1)
Sodium: 136 mmol/L (ref 135–145)
Total Bilirubin: 0.7 mg/dL (ref 0.3–1.2)
Total Protein: 7.7 g/dL (ref 6.5–8.1)

## 2021-04-06 LAB — CBC WITH DIFFERENTIAL/PLATELET
Abs Immature Granulocytes: 0.03 10*3/uL (ref 0.00–0.07)
Basophils Absolute: 0 10*3/uL (ref 0.0–0.1)
Basophils Relative: 0 %
Eosinophils Absolute: 0.3 10*3/uL (ref 0.0–0.5)
Eosinophils Relative: 3 %
HCT: 43.4 % (ref 39.0–52.0)
Hemoglobin: 15.3 g/dL (ref 13.0–17.0)
Immature Granulocytes: 0 %
Lymphocytes Relative: 24 %
Lymphs Abs: 2.5 10*3/uL (ref 0.7–4.0)
MCH: 30.1 pg (ref 26.0–34.0)
MCHC: 35.3 g/dL (ref 30.0–36.0)
MCV: 85.4 fL (ref 80.0–100.0)
Monocytes Absolute: 0.7 10*3/uL (ref 0.1–1.0)
Monocytes Relative: 6 %
Neutro Abs: 6.8 10*3/uL (ref 1.7–7.7)
Neutrophils Relative %: 67 %
Platelets: 276 10*3/uL (ref 150–400)
RBC: 5.08 MIL/uL (ref 4.22–5.81)
RDW: 12.7 % (ref 11.5–15.5)
WBC: 10.3 10*3/uL (ref 4.0–10.5)
nRBC: 0 % (ref 0.0–0.2)

## 2021-04-06 LAB — RAPID URINE DRUG SCREEN, HOSP PERFORMED
Amphetamines: NOT DETECTED
Barbiturates: NOT DETECTED
Benzodiazepines: NOT DETECTED
Cocaine: NOT DETECTED
Opiates: NOT DETECTED
Tetrahydrocannabinol: NOT DETECTED

## 2021-04-06 LAB — ETHANOL: Alcohol, Ethyl (B): <10 mg/dL (ref <10)

## 2021-04-06 LAB — ACETAMINOPHEN LEVEL: Acetaminophen (Tylenol), Serum: <10 ug/mL — ABNORMAL LOW (ref 10–30)

## 2021-04-06 LAB — SALICYLATE LEVEL: Salicylate Lvl: <7 mg/dL — ABNORMAL LOW (ref 7.0–30.0)

## 2021-04-06 MED ORDER — GABAPENTIN 400 MG PO CAPS
400.0000 mg | ORAL_CAPSULE | Freq: Four times a day (QID) | ORAL | Status: DC
  Administered 2021-04-06 (×2): 400 mg via ORAL
  Filled 2021-04-06 (×2): qty 1

## 2021-04-06 MED ORDER — MELOXICAM 7.5 MG PO TABS
7.5000 mg | ORAL_TABLET | Freq: Two times a day (BID) | ORAL | Status: DC
  Administered 2021-04-06: 7.5 mg via ORAL
  Filled 2021-04-06 (×4): qty 1

## 2021-04-06 MED ORDER — DULOXETINE HCL 30 MG PO CPEP: 60.0000 mg | ORAL_CAPSULE | Freq: Every day | ORAL | Status: DC

## 2021-04-06 NOTE — BH Assessment (Addendum)
Disposition:   [name], PA recommended inpatient psychiatric care. Disposition Counselor notified [facility] Everardo Pacific, RN of patient's bed need via secure chat. Pending review for possible placement.  [facility], confirmed that [facility] has an appropriate bed pending patient's negative COVID results. Provided updates to his nurse Jeanella Anton, RN) at [facility] via secure chat. Marland Kitchen

## 2021-04-06 NOTE — ED Notes (Signed)
Pt being assessed by tts
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
## 2021-04-06 NOTE — ED Notes (Signed)
Labs obtained without issue.

## 2021-04-06 NOTE — ED Notes (Signed)
Pt given night time meds and asked if there was anything he needed. Got pt water and warm blanket. Pt calm and resting in his bed at this time. Will continue to monitor pt.

## 2021-04-06 NOTE — ED Provider Notes (Signed)
[facility] EMERGENCY DEPARTMENT Provider Note   CSN: [id number] Arrival date & time: 04/06/21  1146     History Chief Complaint  Patient presents with  . Medical Clearance    [name] [name] is a 55 y.o. male with past medical history of bipolar disorder, polysubstance abuse, and chronic pain syndrome who presents to the ED via police under IVC.  I reviewed patient's medical record and he was last seen by his primary care provider on 03/04/2021 and he was noted that he had not been able to follow-up with psychiatry.  At that time, he appeared to be exhibiting symptoms of hypomania.  I obtained history from [employer] reports that IVC paperwork is already been followed.  She stated that he is at his baseline of chronic bipolar with AVH.  He has not been threatening towards her, but IVC paperwork was followed by probation officer.  Evidently he was noted to be endorsing hallucinations and making comments about being killed or killing others.  On my examination, patient reports that he was applying something at [employer] and was pulled over by [facility] police department and arrested him for drug paraphernalia.  He states that he is working on [employer] sequencing with the [employer] and that his genomics project has put his life and the life his family at risk.  Fortunately they work with the [facility] and are able to protect them.  He states that there is a buzzing that can be detected if I placed my stethoscope on his forearm and also states that his "headgear" has to be adjusted.  He denies any medical complaints at present.  Denies any HI, SI, AVH, or alcohol use.  Level 5 caveat due to psychiatric disorder.  HPI     Past Medical History:  Diagnosis Date  . Anxiety   . Bipolar 1 disorder (HCC)   . Chronic fatigue   . Chronic pain   . Complete intestinal obstruction (HCC)   . Depression    Phreesia 12/03/2020  . Fecal peritonitis (HCC) 08/03/2019  . Fibromyalgia   . Hyperlipidemia    Phreesia 12/03/2020   . Ileus, postoperative (HCC)   . Perforated rectum (HCC) 08/03/2019    Patient Active Problem List   Diagnosis Date Noted  . Head injury 12/04/2020  . Chronic pain syndrome 12/04/2020  . Nausea 12/04/2020  . History of substance use 12/04/2020  . Tobacco abuse 12/04/2020  . BP (high blood pressure) 12/04/2020  . Loss of weight 01/16/2020  . Encounter to establish care 01/16/2020  . Rectal perforation (HCC) 12/23/2019  . Incisional hernia, without obstruction or gangrene 11/15/2019  . Colostomy in place [facility]) 09/27/2019  . HYPERLIPIDEMIA 04/26/2007  . DISORDER, BIPOLAR NOS 04/26/2007  . ALLERGIC RHINITIS 04/26/2007    Past Surgical History:  Procedure Laterality Date  . COLON RESECTION SIGMOID  08/03/2019   Procedure: COLON RESECTION SIGMOID;  Surgeon: Lucretia Roers, MD;  Location: [facility] ORS;  Service: General;;  . COLOSTOMY N/A 08/03/2019   Procedure: COLOSTOMY;  Surgeon: Lucretia Roers, MD;  Location: [facility] ORS;  Service: General;  Laterality: N/A;  . FLEXIBLE SIGMOIDOSCOPY N/A 08/03/2019   Procedure: FLEXIBLE SIGMOIDOSCOPY;  Surgeon: Corbin Ade, MD;  Location: [facility] ENDO SUITE;  Service: Endoscopy;  Laterality: N/A;  . KNEE ARTHROSCOPY Left 2006   miniscus tear  . LAPAROTOMY N/A 08/03/2019   Procedure: EXPLORATORY LAPAROTOMY;  Surgeon: Lucretia Roers, MD;  Location: [facility] ORS;  Service: General;  Laterality: N/A;  . LYMPH GLAND  EXCISION Left 1983  . SMALL INTESTINE SURGERY N/A    Phreesia 12/03/2020  . TONSILLECTOMY         Family History  Problem Relation Age of Onset  . Diabetes Mother   . Heart attack Father   . Diabetes Maternal Grandmother   . Colon cancer Neg Hx   . Colon polyps Neg Hx     Social History   Tobacco Use  . Smoking status: Current Every Day Smoker    Packs/day: 0.50    Years: 10.00    Pack years: 5.00  . Smokeless tobacco: Never Used  Vaping Use  . Vaping Use: Former  Substance Use Topics  . Alcohol use: Not Currently    Comment:  occasional; denied 10/02/19  . Drug use: Not Currently    Types: Cocaine    Comment: last used in December 2020.     Home Medications Prior to Admission medications   Medication Sig Start Date End Date Taking? Authorizing Provider  cyclobenzaprine (FLEXERIL) 10 MG tablet Take 1 tablet (10 mg total) by mouth 2 (two) times daily as needed for muscle spasms. 03/04/21  Yes Anabel Halon, MD  DULoxetine (CYMBALTA) 60 MG capsule Take 1 capsule (60 mg total) by mouth daily. 03/04/21  Yes Anabel Halon, MD  gabapentin (NEURONTIN) 400 MG capsule TAKE (1) CAPSULE BY MOUTH FOUR TIMES DAILY. 03/04/21  Yes Anabel Halon, MD  meloxicam (MOBIC) 7.5 MG tablet Take 1 tablet (7.5 mg total) by mouth 2 (two) times daily. 12/04/20  Yes Anabel Halon, MD  clonazePAM (KLONOPIN) 2 MG tablet Take 2 mg by mouth 3 (three) times daily as needed for anxiety. Patient not taking: Reported on 04/06/2021    [provider]  ondansetron (ZOFRAN-ODT) 4 MG disintegrating tablet Take 1 tablet (4 mg total) by mouth every 8 (eight) hours as needed for nausea or vomiting. Patient not taking: Reported on 04/06/2021 12/04/20   Anabel Halon, MD  zolpidem (AMBIEN) 10 MG tablet Take 10 mg by mouth daily as needed. Patient not taking: Reported on 04/06/2021 07/04/20   [provider]    Allergies    Codeine, Divalproex sodium, Erythromycin, Iodinated diagnostic agents, Latex, Sulfa antibiotics, Wheat bran, Azithromycin, Demerol [meperidine], Levaquin [levofloxacin in d5w], Levofloxacin, Meperidine hcl, Morphine, Morphine and related, Other, Penicillin g, and Penicillins  Review of Systems   Review of Systems  All other systems reviewed and are negative.   Physical Exam Updated Vital Signs BP (!) 152/105 (BP Location: Right Arm)   Pulse 97   Temp 99.3 F (37.4 C) (Oral)   Resp 16   Ht 5\' 11"  (1.803 m)   Wt 87.1 kg   SpO2 98%   BMI 26.78 kg/m   Physical Exam Vitals and nursing note reviewed. Exam conducted  with a chaperone present.  Constitutional:      General: He is not in acute distress.    Appearance: Normal appearance. He is not ill-appearing.  HENT:     Head: Normocephalic and atraumatic.  Eyes:     General: No scleral icterus.    Conjunctiva/sclera: Conjunctivae normal.  Cardiovascular:     Rate and Rhythm: Normal rate.     Pulses: Normal pulses.  Pulmonary:     Effort: Pulmonary effort is normal. No respiratory distress.  Abdominal:     General: Abdomen is flat. There is no distension.     Palpations: Abdomen is soft.     Tenderness: There is no abdominal tenderness.  Comments: Colostomy bag in LLQ.  Soft, nondistended abdomen.  No tenderness or overlying skin changes..  Skin:    General: Skin is dry.  Neurological:     Mental Status: He is alert.     GCS: GCS eye subscore is 4. GCS verbal subscore is 5. GCS motor subscore is 6.  Psychiatric:        Mood and Affect: Mood normal.        Behavior: Behavior normal.        Thought Content: Thought content normal.     ED Results / Procedures / Treatments   Labs (all labs ordered are listed, but only abnormal results are displayed) Labs Reviewed  COMPREHENSIVE METABOLIC PANEL - Abnormal; Notable for the following components:      Result Value   Glucose, Bld 112 (*)    All other components within normal limits  SALICYLATE LEVEL - Abnormal; Notable for the following components:   Salicylate Lvl <7.0 (*)    All other components within normal limits  ACETAMINOPHEN LEVEL - Abnormal; Notable for the following components:   Acetaminophen (Tylenol), Serum <10 (*)    All other components within normal limits  ETHANOL  CBC WITH DIFFERENTIAL/PLATELET  RAPID URINE DRUG SCREEN, HOSP PERFORMED    EKG None  Radiology No results found.  Procedures Procedures   Medications Ordered in ED Medications - No data to display  ED Course  I have reviewed the triage vital signs and the nursing notes.  Pertinent labs &  imaging results that were available during my care of the patient were reviewed by me and considered in my medical decision making (see chart for details).    MDM Rules/Calculators/A&P                          [name] [name] was evaluated in Emergency Department on 04/06/2021 for the symptoms described in the history of present illness. He was evaluated in the context of the global COVID-19 pandemic, which necessitated consideration that the patient might be at risk for infection with the SARS-CoV-2 virus that causes COVID-19. Institutional protocols and algorithms that pertain to the evaluation of patients at risk for COVID-19 are in a state of rapid change based on information released by regulatory bodies including the CDC and federal and state organizations. These policies and algorithms were followed during the patient's care in the ED.  I personally reviewed patient's medical chart and all notes from triage and staff during today's encounter. I have also ordered and reviewed all labs and imaging that I felt to be medically necessary in the evaluation of this patient's complaints and with consideration of their physical exam. If needed, translation services were available and utilized.   Patient arrives to the ED under IVC by his probation officer.  Evidently he is not taking any scheduled medications for his known schizophrenia.  On my examination, he is demonstrating characteristics of mania.  He denies any medical complaints and his physical exam is otherwise reassuring.  He is medically cleared.  Laboratory work-up is entirely unremarkable.  We will consult TTS to determine disposition.   Final Clinical Impression(s) / ED Diagnoses Final diagnoses:  [facility])    Rx / DC Orders ED Discharge Orders    None       Lorelee New, PA-C 04/06/21 1401    Horton, Clabe Seal, DO 04/06/21 1600

## 2021-04-06 NOTE — ED Notes (Signed)
Pt handcuffed unable to sign mse waiver. MD notified of pt, pt place in hw 8.

## 2021-04-06 NOTE — ED Notes (Signed)
Pt keeps walking in and out of his room, even after being asked to stay in his room. Pt responding to internal stimuli.

## 2021-04-07 ENCOUNTER — Other Ambulatory Visit: Payer: Self-pay

## 2021-04-07 ENCOUNTER — Encounter (HOSPITAL_COMMUNITY): Payer: Self-pay | Admitting: Physician Assistant

## 2021-04-07 ENCOUNTER — Inpatient Hospital Stay (HOSPITAL_COMMUNITY)
Admission: AD | Admit: 2021-04-07 | Discharge: 2021-04-20 | DRG: 885 | Disposition: A | Discharge status: Home / Self Care | Payer: PPO | Attending: Psychiatry | Admitting: Psychiatry

## 2021-04-07 DIAGNOSIS — E785 Hyperlipidemia, unspecified: Secondary | ICD-10-CM | POA: Diagnosis present

## 2021-04-07 DIAGNOSIS — Z560 Unemployment, unspecified: Secondary | ICD-10-CM

## 2021-04-07 DIAGNOSIS — Z833 Family history of diabetes mellitus: Secondary | ICD-10-CM

## 2021-04-07 DIAGNOSIS — F419 Anxiety disorder, unspecified: Secondary | ICD-10-CM | POA: Diagnosis present

## 2021-04-07 DIAGNOSIS — M797 Fibromyalgia: Secondary | ICD-10-CM | POA: Diagnosis present

## 2021-04-07 DIAGNOSIS — F209 Schizophrenia, unspecified: Secondary | ICD-10-CM | POA: Diagnosis present

## 2021-04-07 DIAGNOSIS — Z881 Allergy status to other antibiotic agents status: Secondary | ICD-10-CM

## 2021-04-07 DIAGNOSIS — Z8249 Family history of ischemic heart disease and other diseases of the circulatory system: Secondary | ICD-10-CM | POA: Diagnosis not present

## 2021-04-07 DIAGNOSIS — G4700 Insomnia, unspecified: Secondary | ICD-10-CM | POA: Diagnosis not present

## 2021-04-07 DIAGNOSIS — Z20822 Contact with and (suspected) exposure to covid-19: Secondary | ICD-10-CM | POA: Diagnosis not present

## 2021-04-07 DIAGNOSIS — F200 Paranoid schizophrenia: Secondary | ICD-10-CM | POA: Diagnosis not present

## 2021-04-07 DIAGNOSIS — Z882 Allergy status to sulfonamides status: Secondary | ICD-10-CM | POA: Diagnosis not present

## 2021-04-07 DIAGNOSIS — G894 Chronic pain syndrome: Secondary | ICD-10-CM | POA: Diagnosis present

## 2021-04-07 DIAGNOSIS — F1990 Other psychoactive substance use, unspecified, uncomplicated: Secondary | ICD-10-CM | POA: Diagnosis not present

## 2021-04-07 DIAGNOSIS — Z885 Allergy status to narcotic agent status: Secondary | ICD-10-CM | POA: Diagnosis not present

## 2021-04-07 DIAGNOSIS — Z79899 Other long term (current) drug therapy: Secondary | ICD-10-CM

## 2021-04-07 DIAGNOSIS — F312 Bipolar disorder, current episode manic severe with psychotic features: Principal | ICD-10-CM | POA: Diagnosis present

## 2021-04-07 DIAGNOSIS — Z880 Allergy status to penicillin: Secondary | ICD-10-CM

## 2021-04-07 DIAGNOSIS — I10 Essential (primary) hypertension: Secondary | ICD-10-CM | POA: Diagnosis not present

## 2021-04-07 DIAGNOSIS — Z720 Tobacco use: Secondary | ICD-10-CM | POA: Diagnosis not present

## 2021-04-07 DIAGNOSIS — F17210 Nicotine dependence, cigarettes, uncomplicated: Secondary | ICD-10-CM | POA: Diagnosis present

## 2021-04-07 DIAGNOSIS — R5382 Chronic fatigue, unspecified: Secondary | ICD-10-CM | POA: Diagnosis present

## 2021-04-07 DIAGNOSIS — Z933 Colostomy status: Secondary | ICD-10-CM

## 2021-04-07 DIAGNOSIS — R030 Elevated blood-pressure reading, without diagnosis of hypertension: Secondary | ICD-10-CM | POA: Diagnosis present

## 2021-04-07 DIAGNOSIS — F319 Bipolar disorder, unspecified: Secondary | ICD-10-CM | POA: Diagnosis present

## 2021-04-07 LAB — HEMOGLOBIN A1C
Hgb A1c MFr Bld: 5.4 % (ref 4.8–5.6)
Mean Plasma Glucose: 108.28 mg/dL

## 2021-04-07 LAB — RESP PANEL BY RT-PCR (FLU A&B, COVID) ARPGX2
Influenza A by PCR: NEGATIVE
Influenza B by PCR: NEGATIVE
SARS Coronavirus 2 by RT PCR: NEGATIVE

## 2021-04-07 LAB — LIPID PANEL
Cholesterol: 212 mg/dL — ABNORMAL HIGH (ref 0–200)
HDL: 48 mg/dL (ref >40)
LDL Cholesterol: 103 mg/dL — ABNORMAL HIGH (ref 0–99)
Total CHOL/HDL Ratio: 4.4 RATIO
Triglycerides: 307 mg/dL — ABNORMAL HIGH (ref <150)
VLDL: 61 mg/dL — ABNORMAL HIGH (ref 0–40)

## 2021-04-07 LAB — TSH: TSH: 1.999 u[IU]/mL (ref 0.350–4.500)

## 2021-04-07 MED ORDER — GABAPENTIN 400 MG PO CAPS
400.0000 mg | ORAL_CAPSULE | Freq: Four times a day (QID) | ORAL | Status: DC
  Administered 2021-04-07 – 2021-04-14 (×29): 400 mg via ORAL
  Filled 2021-04-07 (×40): qty 1

## 2021-04-07 MED ORDER — CYCLOBENZAPRINE HCL 10 MG PO TABS
10.0000 mg | ORAL_TABLET | Freq: Two times a day (BID) | ORAL | Status: DC | PRN
  Administered 2021-04-07 – 2021-04-17 (×6): 10 mg via ORAL
  Filled 2021-04-07 (×6): qty 2

## 2021-04-07 MED ORDER — ALUM & MAG HYDROXIDE-SIMETH 200-200-20 MG/5ML PO SUSP: 30.0000 mL | ORAL | Status: DC | PRN

## 2021-04-07 MED ORDER — ACETAMINOPHEN 325 MG PO TABS
650.0000 mg | ORAL_TABLET | Freq: Four times a day (QID) | ORAL | Status: DC | PRN
  Administered 2021-04-07 – 2021-04-20 (×9): 650 mg via ORAL
  Filled 2021-04-07 (×9): qty 2

## 2021-04-07 MED ORDER — HYDROXYZINE HCL 25 MG PO TABS
25.0000 mg | ORAL_TABLET | Freq: Three times a day (TID) | ORAL | Status: DC | PRN
  Administered 2021-04-07 – 2021-04-19 (×14): 25 mg via ORAL
  Filled 2021-04-07 (×14): qty 1

## 2021-04-07 MED ORDER — RISPERIDONE 1 MG PO TBDP
1.0000 mg | ORAL_TABLET | Freq: Every day | ORAL | Status: DC
  Administered 2021-04-07: 1 mg via ORAL
  Filled 2021-04-07 (×4): qty 1

## 2021-04-07 MED ORDER — RISPERIDONE 2 MG PO TBDP
2.0000 mg | ORAL_TABLET | Freq: Every day | ORAL | Status: DC
  Administered 2021-04-07: 2 mg via ORAL
  Filled 2021-04-07 (×2): qty 1

## 2021-04-07 MED ORDER — TRAZODONE HCL 100 MG PO TABS
100.0000 mg | ORAL_TABLET | Freq: Every day | ORAL | Status: DC
  Administered 2021-04-07 – 2021-04-19 (×13): 100 mg via ORAL
  Filled 2021-04-07 (×15): qty 1

## 2021-04-07 MED ORDER — DULOXETINE HCL 60 MG PO CPEP
60.0000 mg | ORAL_CAPSULE | Freq: Every day | ORAL | Status: DC
  Administered 2021-04-07: 60 mg via ORAL
  Filled 2021-04-07 (×3): qty 1

## 2021-04-07 MED ORDER — MELOXICAM 7.5 MG PO TABS
7.5000 mg | ORAL_TABLET | Freq: Two times a day (BID) | ORAL | Status: DC
  Administered 2021-04-07 – 2021-04-20 (×27): 7.5 mg via ORAL
  Filled 2021-04-07 (×31): qty 1

## 2021-04-07 MED ORDER — MAGNESIUM HYDROXIDE 400 MG/5ML PO SUSP: 30.0000 mL | Freq: Every day | ORAL | Status: DC | PRN

## 2021-04-07 MED ORDER — CLONAZEPAM 0.5 MG PO TABS
0.5000 mg | ORAL_TABLET | Freq: Two times a day (BID) | ORAL | Status: DC
  Administered 2021-04-07 – 2021-04-11 (×8): 0.5 mg via ORAL
  Filled 2021-04-07 (×8): qty 1

## 2021-04-07 MED ORDER — NICOTINE 21 MG/24HR TD PT24
21.0000 mg | MEDICATED_PATCH | Freq: Every day | TRANSDERMAL | Status: DC
  Administered 2021-04-07 – 2021-04-20 (×14): 21 mg via TRANSDERMAL
  Filled 2021-04-07 (×17): qty 1

## 2021-04-07 MED ORDER — TRAZODONE HCL 50 MG PO TABS: 50.0000 mg | ORAL_TABLET | Freq: Every evening | ORAL | Status: DC | PRN

## 2021-04-07 NOTE — Progress Notes (Signed)
   04/07/21 0414  Psych Admission Type (Psych Patients Only)  Admission Status Involuntary  Psychosocial Assessment  Patient Complaints Anxiety;Depression  Eye Contact Brief  Facial Expression Animated  Affect Preoccupied  Speech Rapid;Pressured;English as a second language teacher;Hyperactive  Appearance/Hygiene Poor hygiene;In scrubs  Behavior Characteristics Cooperative;Appropriate to situation;Anxious;Hyperactive  Mood Anxious;Preoccupied;Pleasant  Thought Process  Coherency Disorganized;Tangential;Loose associations  Content Preoccupation;Delusions;Paranoia  Delusions Paranoid;Grandeur  Perception WDL  Hallucination None reported or observed  Judgment Impaired  Confusion Mild  Danger to Self  Current suicidal ideation? Denies  Danger to Others  Danger to Others None reported or observed

## 2021-04-07 NOTE — Tx Team (Signed)
Initial Treatment Plan 04/07/2021 4:30 AM [name] [name] [id number]    PATIENT STRESSORS: Medication change or noncompliance Other: delusions   PATIENT STRENGTHS: Average or above average intelligence Capable of independent living Supportive family/friends   PATIENT IDENTIFIED PROBLEMS: Delusions (grandeur, paranoid)  Medication non-compliance  Schizophrenia  Chronic fatigue and pain  (pt wants to work on Producer, television/film/video new languages other than Spanish)             DISCHARGE CRITERIA:  Adequate post-discharge living arrangements Improved stabilization in mood, thinking, and/or behavior Verbal commitment to aftercare and medication compliance  PRELIMINARY DISCHARGE PLAN: Attend aftercare/continuing care group Outpatient therapy Return to previous living arrangement  PATIENT/FAMILY INVOLVEMENT: This treatment plan has been presented to and reviewed with the patient, [name] [name], and/or family member.  The patient and family have been given the opportunity to ask questions and make suggestions.  Victorino December, RN 04/07/2021, 4:30 AM

## 2021-04-07 NOTE — [facility] Suicide Risk Assessment (Signed)
[facility] Admission Suicide Risk Assessment   Nursing information obtained from:  Patient Demographic factors:  Male,Living alone,Unemployed,Low socioeconomic status Current Mental Status:  Mania, delusions, response to internal stimuli Loss Factors:  Separated from spouse; unemployed Historical Factors:  Previous psychiatric diagnoses Risk Reduction Factors:  Social supports  Total Time Spent in Direct Patient Care:  I personally spent 70 minutes on the unit in direct patient care. The direct patient care time included face-to-face time with the patient, reviewing the patient's chart, communicating with other professionals, and coordinating care. Greater than 50% of this time was spent in counseling or coordinating care with the patient regarding goals of hospitalization, psycho-education, and discharge planning needs.  Principal Problem: Bipolar I disorder, current or most recent episode manic, with psychotic features (HCC) Diagnosis:  Principal Problem:   Bipolar I disorder, current or most recent episode manic, with psychotic features (HCC) Active Problems:   Colostomy in place [facility])   Chronic pain syndrome   Tobacco abuse  Subjective Data: [name] [name] is a 55 y.o. male with a history of bipolar d/o, chronic pain syndrome, polysubstance abuse, and perforated rectum s/p partial colectomy and colostomy, who was initially admitted for inpatient psychiatric hospitalization on 04/07/2021 for management of bipolar mania with associated psychotic features. The patient is a poor historian and the majority of his history is obtained from chart review. According to his ED notes prior to admission, the patient was placed under IVC by his probation officer due to concern for pressured speech, flight of ideas, and report of hallucinations. During his ED assessment, the patient was reportedly disorganized in his thinking, was delusional that he was friends with [facility] and was helping set up "hits" for the  [employer], was paranoid that he had been injected with something, and had rapid speech.   On exam, today, the patient states he is unsure why he is in the hospital and states his mood is "good."  On interview, the patient has disorganized thinking with frequent derailment, is pressured, and makes frequent delusional statements.He reports that he is friends with Hewitt Blade, Kathlen Brunswick, and [facility]. He states he has "53 women" who come into his home to "schedule for him" and he then derails into discussion about how he is friends with the President and helped Biden put together a 5.2 million dollar deal. 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 He denies drug or alcohol use but then derails into discussion about previously being charged for drug paraphernalia when in possession of a "silver slurpee straw" at a gas station. His outpatient records indicate he has a previous h/o polysubstance abuse but do not give details. His UDS prior to admission was negative, but a UDS 8 months ago was positive for opiates, cocaine, benzodiazepines, and amphetamines.   The patient's outpatient primary care records show that in early March 2022, his PCP was suspect that he was hypomanic and was starting to have some delusional thinking. At that visit, he was on Cymbalta 60mg  daily and Gabapentin 400mg  qid for management of chronic pain. There is also mention in his outpatient records that he was on Klonopin 2mg  tid PRN anxiety and Ambien 10mg  qhs PRN insomnia. According to  PDMP summary he last filled 90 tablets of Klonopin 2mg  in December 2021 and last filled Ambien 10mg  90 tablets in July 2021. His records indicate that his  mother contacted the PCP the end of March to say that the patient had been off his psychotropic medications for 2 years and was decompensating. His records reflect attempts to get him scheduled with his outpatient psychiatrist, Dr. August 2021. Additionally, it appears that at some point the patient was connected with [facility] in [place] in the past. The patient is unclear about his past outpatient psychiatric providers and on interview denies any past psychiatric diagnoses. According to his records, the patient has a diagnosis of Bipolar I but there are no records of previous inpatient psychiatric admissions. On interview, the patient denies previous suicide attempts or past inpatient admissions. He knows he "cannot take Depakote or Lithium" when questioned about previous mood stabilizers but is unclear as to why. 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 His chart review also shows that he has previously received IM Ativan, IM Geodon, Elavil, Abilify, Invega, Risperdal, and has a reported allergy to Depakote.   Continued Clinical Symptoms:  Alcohol Use Disorder Identification Test Final Score (AUDIT): 0 The "Alcohol Use Disorders Identification Test", Guidelines for Use in Primary Care, Second Edition.  World [facility]). Score between 0-7:  no or low risk or alcohol related problems. Score between 8-15:  moderate risk of alcohol related problems. Score between 16-19:  high risk of alcohol related problems. Score 20 or above:  warrants further diagnostic evaluation for alcohol dependence and treatment.  CLINICAL FACTORS:  Bipolar d/o Currently psychotic Previous psychiatric diagnoses/treatments  Musculoskeletal: Strength & Muscle Tone: within normal limits Gait & Station: Unassessed Patient leans: N/A  Psychiatric  Specialty Exam: Physical Exam Vitals reviewed.  HENT:     Head: Normocephalic.     Mouth/Throat:     Mouth: Mucous membranes are moist.  Eyes:     Extraocular Movements: Extraocular movements intact.  Cardiovascular:     Rate and Rhythm: Normal rate and regular rhythm.  Pulmonary:     Effort: Pulmonary effort is normal.     Breath sounds: Normal breath sounds.  Musculoskeletal:        General: Normal range of motion.  Skin:    General: Skin is warm and dry.  Neurological:     Mental Status: He is alert.     Comments: CN 3-12 grossly intact, equal grip, intact finger to nose     Review of Systems - patient could not cooperate for questioning  Blood pressure 131/85, pulse 77, temperature 98.1 F (36.7 C), temperature source Oral, resp. rate 16, height 5\' 11"  (1.803 m), weight 91.2 kg, SpO2 100 %.Body mass index is 28.03 kg/m.  General Appearance: disheveled appearing, appears stated age  Eye Contact:  Fair  Speech:  Pressured  Volume:  Normal  Mood:  Elevated, anxious  Affect:  congruent  Thought Process:  Disorganized with frequent derailment  Orientation:  Oriented to self, city, [facility], month, date, year  Thought Content:  Paranoid delusions that he has been injected with neurotoxins; grandiose delusions that he knows celebrities and works in [employer] and on classified projects; Denies AVH but seen talking to himself on exam; denies ideas of reference or first rank symptoms  Suicidal Thoughts:  No  Homicidal Thoughts:  No  Memory:  Recent;   Poor  Judgement:  Impaired  Insight:  Lacking  Psychomotor Activity:  Fidgety during interview  Concentration:  Concentration: Poor and Attention Span: Poor  Recall:  Poor  Fund of Knowledge:  Fair  Language:  Good  Akathisia:  Negative  Assets:  Communication Skills Desire for Improvement Resilience  ADL's:  Independent  Cognition:  Impaired,  Mild secondary to psychosis/mania   COGNITIVE FEATURES THAT CONTRIBUTE TO RISK:   Loss of executive function given current psychosis/mania symptoms  SUICIDE RISK:   Mild:  Suicidal ideation of limited frequency, intensity, duration, and specificity.  There are no identifiable plans, no associated intent, mild dysphoria and related symptoms, good self-control (both objective and subjective assessment), few other risk factors, and identifiable protective factors, including available and accessible social support.  PLAN OF CARE: See admission H&P  I certify that inpatient services furnished can reasonably be expected to improve the patient's condition.   Carvin Locket, MD, FAPA 04/07/2021, 12:06 PM

## 2021-04-07 NOTE — Progress Notes (Signed)
Progress note    04/07/21 0801  Psych Admission Type (Psych Patients Only)  Admission Status Involuntary  Psychosocial Assessment  Patient Complaints Anxiety;Hyperactivity  Eye Contact Fair  Facial Expression Animated;Anxious;Pensive  Affect Anxious;Preoccupied  Speech Rapid;Pressured;English as a second language teacher;Hyperactive;Pacing  Appearance/Hygiene Poor hygiene;In scrubs  Behavior Characteristics Cooperative;Appropriate to situation;Anxious;Fidgety;Impulsive;Pacing  Mood Anxious;Preoccupied;Pleasant  Thought Process  Coherency Disorganized;Flight of ideas;Tangential  Content Obsessions;Preoccupation  Delusions Paranoid  Perception WDL  Hallucination None reported or observed  Judgment Poor  Confusion Mild  Danger to Self  Current suicidal ideation? Denies  Danger to Others  Danger to Others None reported or observed

## 2021-04-07 NOTE — Progress Notes (Signed)
Ostomy part numbers:  Pouch #[id number], [id number]  2 piece cut to fit #2, [id number]

## 2021-04-07 NOTE — Progress Notes (Signed)
Adult Psychoeducational Group Note  Date:  04/07/2021 Time:  10:51 PM  Group Topic/Focus:  Wrap-Up Group:   The focus of this group is to help patients review their daily goal of treatment and discuss progress on daily workbooks.  Participation Level:  Did Not Attend  Participation Quality:  Did Not Attend  Affect:  Did Not Attend  Cognitive:  Did Not Attend  Insight: None  Engagement in Group:  Did Not Attend  Modes of Intervention:  Did Not Attend  Additional Comments:  Pt did not attend evening wrap up group tonight.  Felipa Furnace 04/07/2021, 10:51 PM

## 2021-04-07 NOTE — Progress Notes (Addendum)
   04/07/21 2100  Psych Admission Type (Psych Patients Only)  Admission Status Involuntary  Psychosocial Assessment  Patient Complaints Hyperactivity  Eye Contact Fair  Facial Expression Animated;Anxious;Pensive  Affect Anxious;Preoccupied  Speech Rapid;Pressured;English as a second language teacher;Hyperactive  Appearance/Hygiene Poor hygiene;In scrubs  Behavior Characteristics Cooperative;Anxious  Mood Anxious;Preoccupied;Pleasant  Thought Process  Coherency Disorganized;Tangential  Content Obsessions;Preoccupation;Delusions  Delusions Paranoid  Perception WDL  Hallucination None reported or observed  Judgment Poor  Confusion Mild  Danger to Self  Current suicidal ideation? Denies  Danger to Others  Danger to Others None reported or observed   Pt seen in hallway. Denies SI, HI, AVH. Rates pain 60/10. Says the pain was less in the morning. Then pt becomes tangential and delusional. Pt states he had a good day.

## 2021-04-07 NOTE — Progress Notes (Signed)
Patient ID: [name] [name], male   DOB: 1966-11-01, 55 y.o.   MRN: [id number]  D: Pt here IVC from [place]. Pt was IVC'd by his Research officer, trade union. Pt has history of schizophrenia. Pt has been off his medications. Pt is hyperverbal with delusions of grandeur as well as paranoia. Pt denies SI, HI, AVH. Pt rates pain greater than 10/10 d/t chronic pain condition. Pt also has history of anxiety, depression, chronic fatigue. Pt had a colon resection in 2020 and has a colostomy in the LLQ that he manages on his own. Pt rates anxiety 10/10 and depression 2/10. Says anxiety is high due to his increased pain.   Pt lives alone and is on disability. Counts his mother Densil Ottey), his aunt Jinger Neighbors) and his sister Vertis Kelch) as his main supports. Pt is legally separated from his wife for past 3 years. Pt believes that someone has been injecting him with chemicals. 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 Also says that he works for [employer] and [employer]. He says he gets home care services from a company called [employer] that regularly injects him with various radioisotopes. Pt has a PCP, Dr. Posey Pronto, whom he saw last month.  Pt identifies his stressor has having to sell his properties in [place]. Pt says he wants to work on learning a new language other than Spanish. Pt is delusional and preoccupied but has moments of coherency. Pt needs constant redirection to stay on task. Pt pleasant.   From Assessment note:  Patient was placed on IVC by probation officer. Pt had told officer that someone had injected him with something. He has not been taking his medications as directed. 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 He is good friends with [facility] and has been involved with [employer].  Patient was placed on IVC. He had made some threats to harm himself and others. Patient is floridly psychotic. He says he takes his medications as directed. Pt  says he schedules hitmen for the [employer], hangs out with [facility], etc. Patient talks non-stop on a variety of interesting subjects. Pt shows no evidence of having kept up with his medications.  Patient talks non-stop.  At times he laughs about the amount of talking he does.  Speech is rapid and incoherent.  Pt has good eye contact but is oriented x2.  He says he is not having hallucinations but he certainly is delusional.  Pt cannot tell accurately what his appetite and sleep habits are.    A: Pt was offered support and encouragement. Pt is cooperative during assessment. VS assessed and admission paperwork signed. Belongings searched and contraband items placed in locker. Non-invasive skin search completed: pt has colostomy bag in LLQ, old scars to L leg and foot and old bruising to R foot.. Pt offered food and drink and both accepted. Pt introduced to unit milieu by nursing staff. Q 15 minute checks were started for safety.  R: Pt in room eating. Pt safety maintained on unit.

## 2021-04-07 NOTE — [facility] Group Notes (Signed)
[facility] Group Notes:  (Nursing/MHT/Case Management/Adjunct)  Date:  04/07/2021  Time:  4:43 PM  Type of Therapy:  Group Therapy  Participation Level:  Active  Participation Quality:  Appropriate  Affect:  Excited  Cognitive:  Alert and Appropriate  Insight:  Appropriate and Good  Engagement in Group:  Engaged  Modes of Intervention:  Orientation  Summary of Progress/Problems: Pt goal for today is to be able to get the help he needs so that he can get out of here.   Kamori Kitchens J Aston Lawhorn 04/07/2021, 4:43 PM

## 2021-04-07 NOTE — ED Notes (Signed)
Pt belongings given to Fish farm manager for transport

## 2021-04-07 NOTE — H&P (Addendum)
Psychiatric Admission Assessment Adult  Patient Identification: [name] [name] MRN:  [id number] Date of Evaluation:  04/07/2021   Chief Complaint:  Mania with psychosis  Principal Diagnosis: Bipolar I disorder, current or most recent episode manic, with psychotic features (HCC) Diagnosis:  Principal Problem:   Bipolar I disorder, current or most recent episode manic, with psychotic features (HCC) Active Problems:   Colostomy in place [place] [name])   Chronic pain syndrome   Tobacco abuse  History of Present Illness: [name] [name] is a 55 y.o. male with a history of bipolar d/o, chronic pain syndrome, polysubstance abuse, and perforated rectum s/p partial colectomy and colostomy, who was initially admitted for inpatient psychiatric hospitalization on 04/07/2021 for management of bipolar mania with associated psychotic features. The patient is a poor historian and the majority of his history is obtained from chart review. According to his ED notes prior to admission, the patient was placed under IVC by his probation officer due to concern for pressured speech, flight of ideas, and report of hallucinations. During his ED assessment, the patient was reportedly disorganized in his thinking, was delusional that he was friends with [name] [name] and was helping set up "hits" for the [employer], was paranoid that he had been injected with something, and had rapid speech.   On exam, today, the patient states he is unsure why he is in the [name] and states his mood is "good."  On interview, the patient has disorganized thinking with frequent derailment, is pressured, and makes frequent delusional statements.He reports that he is friends with [name] [name], [name], and [employer] [name]. He states he has "25 women" who come into his home to "schedule for him" and he then derails into discussion about how he is friends with the President and helped Biden put together a 5.2 million dollar deal. 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 He denies drug or alcohol use but then derails into discussion about previously being charged for drug paraphernalia when in possession of a "silver slurpee straw" at a gas station. His outpatient records indicate he has a previous h/o polysubstance abuse but do not give details. His UDS prior to admission was negative, but a UDS 8 months ago was positive for opiates, cocaine, benzodiazepines, and amphetamines.   The patient's outpatient primary care records show that in early March 2022, his PCP was suspect that he was hypomanic and was starting to have some delusional thinking. At that visit, he was on Cymbalta  daily and Gabapentin  qid for management of chronic pain. There is also mention in his outpatient records that he was on Klonopin  tid PRN anxiety and Ambien  qhs PRN insomnia. According to PDMP summary he last filled 90 tablets of Klonopin  in December 2021 and last filled Ambien  90 tablets in July 2021. His records indicate that his mother contacted the PCP the end of March to say that the patient had been off his psychotropic medications for 2 years and was decompensating. His records reflect attempts to get him scheduled with his outpatient psychiatrist, Dr. Evelene Croon. Additionally, it appears that at some point the patient was connected with [facility] in [place] in the past. The patient is unclear  about his past outpatient psychiatric providers and on interview denies any past psychiatric diagnoses. According to his records, the patient has a diagnosis of Bipolar I but there are no records of previous inpatient psychiatric admissions. On interview, the patient denies previous  suicide attempts or past inpatient admissions. He knows he "cannot take Depakote or Lithium" when questioned about previous mood stabilizers but is unclear as to why. 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 His chart review also shows that he has previously received IM Ativan, IM Geodon, Elavil, Abilify, Invega, Risperdal, and has a reported allergy to Depakote. The patient does not recognize that he is currently manic or psychotic and could not answer questions about previous manic episodes. Given his present level of disorganization, he could not answer screening questions for depression or comment on recent sleep or appetite.   Total Time Spent in Direct Patient Care:  I personally spent 70 minutes on the unit in direct patient care. The direct patient care time included face-to-face time with the patient, reviewing the patient's chart, communicating with other professionals, and coordinating care. Greater than 50% of this time was spent in counseling or coordinating care with the patient regarding goals of hospitalization, psycho-education, and discharge planning needs.  Past Psychiatric History:  Previous Psychiatric Diagnoses: Per EHR: Bipolar I, polysubstance abuse Current / Past Mental Health Providers: Per EHR: Dr. Evelene Croon and Granville Lewis Previous Psychological Evaluations: Yes  Prior Inpatient or Outpatient Therapy: Unknown - patient cannot answer Past Psychiatric Hospitalizations: Denied per patient; none noted in EHR Past Suicide Attempts: Denied per patient Past History of Homicidal Behaviors / Violent or Aggressive Behaviors: Denied per patient Previous Participation in PHP/IOP or Residential Programs: Unknown - patient cannot answer Past History of ECT / TMS / VNS: Unknown - patient cannot answer Past Psychotropic  Medication Trials: Per EHR: Ambien, Abilify, Klonopin, Depakote, Risperdal, Elavil, Geodon IM, Ativan IM, Cymbalta, Neurontin, Invega and per patient he "cannot take Lithium"   Is the patient at risk to self? Yes.    Has the patient been a risk to self in the past 6 months? Yes in context of recent mania with psychosis Has the patient been a risk to self within the distant past? No.  Is the patient a risk to others? Yes.    Has the patient been a risk to others in the past 6 months? Yes in context of recent mania with psychosis Has the patient been a risk to others within the distant past? No.   Substance Use History: Substance Abuse History in the last 12 months: Yes.   Illicit Drug Use: Patient denies and UDS on admission was negative but UDS from 8 months ago was positive for cocaine, amphetamines, benzodiazepines, and Cocaine IV Drug Use: Denied per patient Alcohol Use / Abuse: Denied per patient History of Detox / Rehab: Denied per patient History of Withdrawal / Blackouts / DTs: Unknown - patient could not answer Consequences of Substance Use: Unknown - patient could not answer  Alcohol Screening: 1. How often do you have a drink containing alcohol?: Never 2. How many drinks containing alcohol do you have on a typical day when you are drinking?: 1 or 2 3. How often do you have six or more drinks on one occasion?: Never AUDIT-C Score: 0 9. Have you or someone else been injured as a result  of your drinking?: No 10. Has a relative or friend or a doctor or another health worker been concerned about your drinking or suggested you cut down?: No Alcohol Use Disorder Identification Test Final Score (AUDIT): 0   Tobacco Screening: Have you used any form of tobacco in the last 30 days? (Cigarettes, Smokeless Tobacco, Cigars, and/or Pipes): Yes Tobacco use, Select all that apply: 5 or more cigarettes per day Are you interested in Tobacco Cessation Medications?: Yes, will notify MD for an  order Counseled patient on smoking cessation including recognizing danger situations, developing coping skills and basic information about quitting provided: Refused/Declined practical counseling  Past Medical History:  Past Medical History:  Diagnosis Date  . Anxiety   . Bipolar 1 disorder (HCC)   . Chronic fatigue   . Chronic pain   . Complete intestinal obstruction (HCC)   . Depression    Phreesia 12/03/2020  . Fecal peritonitis (HCC) 08/03/2019  . Fibromyalgia   . Hyperlipidemia    Phreesia 12/03/2020  . Ileus, postoperative (HCC)   . Perforated rectum (HCC) 08/03/2019    Past Surgical History:  Procedure Laterality Date  . COLON RESECTION SIGMOID  08/03/2019   Procedure: COLON RESECTION SIGMOID;  Surgeon: Lucretia Roers, MD;  Location: [facility];  Service: General;;  . COLOSTOMY N/A 08/03/2019   Procedure: COLOSTOMY;  Surgeon: Lucretia Roers, MD;  Location: [facility];  Service: General;  Laterality: N/A;  . FLEXIBLE SIGMOIDOSCOPY N/A 08/03/2019   Procedure: FLEXIBLE SIGMOIDOSCOPY;  Surgeon: Corbin Ade, MD;  Location: [facility];  Service: Endoscopy;  Laterality: N/A;  . KNEE ARTHROSCOPY Left 2006   miniscus tear  . LAPAROTOMY N/A 08/03/2019   Procedure: EXPLORATORY LAPAROTOMY;  Surgeon: Lucretia Roers, MD;  Location: [facility];  Service: General;  Laterality: N/A;  . LYMPH GLAND EXCISION Left 1983  . SMALL INTESTINE SURGERY N/A    Phreesia 12/03/2020  . TONSILLECTOMY     Family History:  Family History  Problem Relation Age of Onset  . Diabetes Mother   . Heart attack Father   . Diabetes Maternal Grandmother   . Colon cancer Neg Hx   . Colon polyps Neg Hx    Family Psychiatric  History: Patient denies known psychiatric illness, substance abuse or suicides in the family.   Social History:  History of Physical / Emotional / Sexual Abuse: Denied per patient Highest Level of Education Obtained: Unable to obtain - patient did not answer Occupational History /  Employment Status: Patient states he has been unemployed since 2009 and previously worked in Consulting civil engineer  Marital Status / Relationship History: Separated - has a girlfriend Parenting History: No children Living Situation: Lives alone in a house - cannot give additional details but states his girlfriend often stays with him Spiritual History: states "[place] and Geneticist, molecular Service: Unable to obtain - patient could not answer Current / Pending / Systems developer Charges or Previous Biomedical engineer / Prison Time: Patient has a Engineer, drilling and claims he has a previous drug paraphernalia charge - he cannot give further details Access to Firearms: Unable to assess - patient could not answer   Additional Social History: Marital status: Separated Separated, when?: 3.5 years ago What types of issues is patient dealing with in the relationship?: Denies having any issues Additional relationship information: n/a Are you sexually active?: No What is your sexual orientation?: Hetersexual Has your sexual activity been affected by drugs, alcohol, medication, or emotional stress?: Denies Does patient have children?: No  Allergies:   Allergies  Allergen Reactions  . Codeine Shortness Of Breath and Swelling  . Divalproex Sodium Diarrhea, Itching, Rash, Shortness Of Breath and Swelling  . Erythromycin Hives, Itching, Rash and Swelling  . Iodinated Diagnostic Agents Swelling  . Latex Itching and Rash  . Sulfa Antibiotics Diarrhea, Itching, Rash, Shortness Of Breath and Swelling  . Wheat Bran Hives, Itching, Rash, Shortness Of Breath and Swelling  . Azithromycin   . Demerol [Meperidine]   . Levaquin [Levofloxacin In D5w]   . Levofloxacin   . Meperidine Hcl   . Morphine   . Morphine And Related   . Other     arythromycin  . Penicillin G   . Penicillins     REACTION: Rash and facial swelling at age 40   Lab Results:  Results for orders placed or performed during the [name] encounter of 04/06/21 (from the  past 48 hour(s))  Urine rapid drug screen (hosp performed)     Status: None   Collection Time: 04/06/21 12:34 PM  Result Value Ref Range   Opiates NONE DETECTED NONE DETECTED   Cocaine NONE DETECTED NONE DETECTED   Benzodiazepines NONE DETECTED NONE DETECTED   Amphetamines NONE DETECTED NONE DETECTED   Tetrahydrocannabinol NONE DETECTED NONE DETECTED   Barbiturates NONE DETECTED NONE DETECTED    Comment: (NOTE) DRUG SCREEN FOR MEDICAL PURPOSES ONLY.  IF CONFIRMATION IS NEEDED FOR ANY PURPOSE, NOTIFY LAB WITHIN 5 DAYS.  LOWEST DETECTABLE LIMITS FOR URINE DRUG SCREEN Drug Class                     Cutoff (ng/mL) Amphetamine and metabolites    1000 Barbiturate and metabolites    200 Benzodiazepine                 200 Tricyclics and metabolites     300 Opiates and metabolites        300 Cocaine and metabolites        300 THC                            50 Performed at [facility], [address], [place], [place] [address]   Comprehensive metabolic panel     Status: Abnormal   Collection Time: 04/06/21 12:42 PM  Result Value Ref Range   Sodium 136 135 - 145 mmol/L   Potassium 4.2 3.5 - 5.1 mmol/L   Chloride 100 98 - 111 mmol/L   CO2 23 22 - 32 mmol/L   Glucose, Bld 112 (H) 70 - 99 mg/dL    Comment: Glucose reference range applies only to samples taken after fasting for at least 8 hours.   BUN 14 6 - 20 mg/dL   Creatinine, Ser 6.04 0.61 - 1.24 mg/dL   Calcium 9.1 8.9 - 54.0 mg/dL   Total Protein 7.7 6.5 - 8.1 g/dL   Albumin 4.3 3.5 - 5.0 g/dL   AST 22 15 - 41 U/L   ALT 21 0 - 44 U/L   Alkaline Phosphatase 79 38 - 126 U/L   Total Bilirubin 0.7 0.3 - 1.2 mg/dL   GFR, Estimated >98 >11 mL/min    Comment: (NOTE) Calculated using the CKD-EPI Creatinine Equation (2021)    Anion gap 13 5 - 15    Comment: Performed at [facility], [address], [place], [place] [address]  Ethanol     Status: None   Collection Time: 04/06/21 12:42 PM  Result Value Ref Range   Alcohol, Ethyl  (B) <10 <10 mg/dL    Comment: (NOTE) Lowest detectable limit for serum alcohol is 10 mg/dL.  For medical purposes only. Performed at [name], [address] [place], [place] [address]   CBC with Diff     Status: None   Collection Time: 04/06/21 12:42 PM  Result Value Ref Range   WBC 10.3 4.0 - 10.5 K/uL   RBC 5.08 4.22 - 5.81 MIL/uL   Hemoglobin 15.3 13.0 - 17.0 g/dL   HCT 25.0 53.9 - 76.7 %   MCV 85.4 80.0 - 100.0 fL   MCH 30.1 26.0 - 34.0 pg   MCHC 35.3 30.0 - 36.0 g/dL   RDW 34.1 93.7 - 90.2 %   Platelets 276 150 - 400 K/uL   nRBC 0.0 0.0 - 0.2 %   Neutrophils Relative % 67 %   Neutro Abs 6.8 1.7 - 7.7 K/uL   Lymphocytes Relative 24 %   Lymphs Abs 2.5 0.7 - 4.0 K/uL   Monocytes Relative 6 %   Monocytes Absolute 0.7 0.1 - 1.0 K/uL   Eosinophils Relative 3 %   Eosinophils Absolute 0.3 0.0 - 0.5 K/uL   Basophils Relative 0 %   Basophils Absolute 0.0 0.0 - 0.1 K/uL   Immature Granulocytes 0 %   Abs Immature Granulocytes 0.03 0.00 - 0.07 K/uL    Comment: Performed at [facility], [address] [place], [place] [address]  Salicylate level     Status: Abnormal   Collection Time: 04/06/21 12:42 PM  Result Value Ref Range   Salicylate Lvl <7.0 (L) 7.0 - 30.0 mg/dL    Comment: Performed at [facility], [address], [place], [place] [address]  Acetaminophen level     Status: Abnormal   Collection Time: 04/06/21 12:42 PM  Result Value Ref Range   Acetaminophen (Tylenol), Serum <10 (L) 10 - 30 ug/mL    Comment: (NOTE) Therapeutic concentrations vary significantly. A range of 10-30 ug/mL  may be an effective concentration for many patients. However, some  are best treated at concentrations outside of this range. Acetaminophen concentrations >150 ug/mL at 4 hours after ingestion  and >50 ug/mL at 12 hours after ingestion are often associated with  toxic reactions.  Performed at [name], [address] [place], [place] [address]   Resp Panel by RT-PCR (Flu A&B,  Covid) Nasopharyngeal Swab     Status: None   Collection Time: 04/06/21 11:10 PM   Specimen: Nasopharyngeal Swab; Nasopharyngeal(NP) swabs in vial transport medium  Result Value Ref Range   SARS Coronavirus 2 by RT PCR NEGATIVE NEGATIVE    Comment: (NOTE) SARS-CoV-2 target nucleic acids are NOT DETECTED.  The SARS-CoV-2 RNA is generally detectable in upper respiratory specimens during the acute phase of infection. The lowest concentration of SARS-CoV-2 viral copies this assay can detect is 138 copies/mL. A negative result does not preclude SARS-Cov-2 infection and should not be used as the sole basis for treatment or other patient management decisions. A negative result may occur with  improper specimen collection/handling, submission of specimen other than nasopharyngeal swab, presence of viral mutation(s) within the areas targeted by this assay, and inadequate number of viral copies(<138 copies/mL). A negative result must be combined with clinical observations, patient history, and epidemiological information. The expected result is Negative.  Fact Sheet for Patients:  BloggerCourse.com  Fact Sheet for Healthcare Providers:  SeriousBroker.it  This test is no t yet approved or cleared  by the [place] FDA and  has been authorized for detection and/or diagnosis of SARS-CoV-2 by FDA under an Emergency Use Authorization (EUA). This EUA will remain  in effect (meaning this test can be used) for the duration of the COVID-19 declaration under Section 564(b)(1) of the Act, 21 U.S.C.section 360bbb-3(b)(1), unless the authorization is terminated  or revoked sooner.       Influenza A by PCR NEGATIVE NEGATIVE   Influenza B by PCR NEGATIVE NEGATIVE    Comment: (NOTE) The Xpert Xpress SARS-CoV-2/FLU/RSV plus assay is intended as an aid in the diagnosis of influenza from Nasopharyngeal swab specimens and should not be used as a sole  basis for treatment. Nasal washings and aspirates are unacceptable for Xpert Xpress SARS-CoV-2/FLU/RSV testing.  Fact Sheet for Patients: BloggerCourse.comhttps://www.fda.gov/media/152166/download  Fact Sheet for Healthcare Providers: SeriousBroker.ithttps://www.fda.gov/media/152162/download  This test is not yet approved or cleared by the [place] FDA and has been authorized for detection and/or diagnosis of SARS-CoV-2 by FDA under an Emergency Use Authorization (EUA). This EUA will remain in effect (meaning this test can be used) for the duration of the COVID-19 declaration under Section 564(b)(1) of the Act, 21 U.S.C. section 360bbb-3(b)(1), unless the authorization is terminated or revoked.  Performed at [name], [address] [place], [place] [id number]     Blood Alcohol level:  Lab Results  Component Value Date   [facility] 04/[facility]   ETH <10 07/17/2020    Metabolic Disorder Labs:  Lab Results  Component Value Date   HGBA1C 5.8 (H) 03/04/2021   No results found for: PROLACTIN Lab Results  Component Value Date   CHOL 198 03/04/2021   TRIG 212 (H) 03/04/2021   HDL 50 03/04/2021   CHOLHDL 4.0 03/04/2021   VLDL 51 (H) 04/28/2007   LDLCALC 111 (H) 03/04/2021   LDLCALC 100 (H) 04/28/2007   Current Medications: Current Facility-Administered Medications  Medication Dose Route Frequency Provider Last Rate Last Admin  . acetaminophen (TYLENOL) tablet 650 mg  650 mg Oral Q6H PRN Nwoko, Uchenna E, PA      . alum & mag hydroxide-simeth (MAALOX/MYLANTA) 200-200-20 MG/5ML suspension 30 mL  30 mL Oral Q4H PRN Nwoko, Uchenna E, PA      . clonazePAM (KLONOPIN) tablet 0.5 mg  0.5 mg Oral BID Mason JimSingleton, Tameria Patti E, MD      . cyclobenzaprine (FLEXERIL) tablet 10 mg  10 mg Oral BID PRN Nwoko, Uchenna E, PA      . gabapentin (NEURONTIN) capsule 400 mg  400 mg Oral QID Nwoko, Uchenna E, PA   400 mg at 04/07/21 0801  . hydrOXYzine (ATARAX/VISTARIL) tablet 25 mg  25 mg Oral TID PRN Nwoko, Uchenna E, PA      .  magnesium hydroxide (MILK OF MAGNESIA) suspension 30 mL  30 mL Oral Daily PRN Nwoko, Uchenna E, PA      . meloxicam (MOBIC) tablet 7.5 mg  7.5 mg Oral BID Mason JimSingleton, Youssef Footman E, MD   7.5 mg at 04/07/21 0801  . nicotine (NICODERM CQ - dosed in mg/24 hours) patch 21 mg  21 mg Transdermal Daily Nwoko, Uchenna E, PA   21 mg at 04/07/21 0801  . risperiDONE (RISPERDAL M-TABS) disintegrating tablet 1 mg  1 mg Oral Daily Donata Reddick E, MD      . risperiDONE (RISPERDAL M-TABS) disintegrating tablet 2 mg  2 mg Oral QHS Chellsea Beckers E, MD      . traZODone (DESYREL) tablet 100 mg  100 mg Oral QHS  Osowski Locket, MD       PTA Medications: Medications Prior to Admission  Medication Sig Dispense Refill Last Dose  . clonazePAM (KLONOPIN) 2 MG tablet Take 2 mg by mouth 3 (three) times daily as needed for anxiety. (Patient not taking: Reported on 04/06/2021)     . cyclobenzaprine (FLEXERIL) 10 MG tablet Take 1 tablet (10 mg total) by mouth 2 (two) times daily as needed for muscle spasms. 60 tablet 1   . DULoxetine (CYMBALTA) 60 MG capsule Take 1 capsule (60 mg total) by mouth daily. 60 capsule 5   . gabapentin (NEURONTIN) 400 MG capsule TAKE (1) CAPSULE BY MOUTH FOUR TIMES DAILY. 120 capsule 0   . meloxicam (MOBIC) 7.5 MG tablet Take 1 tablet (7.5 mg total) by mouth 2 (two) times daily. 60 tablet 2   . ondansetron (ZOFRAN-ODT) 4 MG disintegrating tablet Take 1 tablet (4 mg total) by mouth every 8 (eight) hours as needed for nausea or vomiting. (Patient not taking: Reported on 04/06/2021) 20 tablet 0   . zolpidem (AMBIEN) 10 MG tablet Take 10 mg by mouth daily as needed. (Patient not taking: Reported on 04/06/2021)       Musculoskeletal: Strength & Muscle Tone: within normal limits Gait & Station: Unassessed Patient leans: N/A  Psychiatric Specialty Exam: Physical Exam Vitals reviewed.  HENT:     Head: Normocephalic.     Mouth/Throat:     Mouth: Mucous membranes are moist.  Eyes:     Extraocular  Movements: Extraocular movements intact.  Cardiovascular:     Rate and Rhythm: Normal rate and regular rhythm.  Pulmonary:     Effort: Pulmonary effort is normal.     Breath sounds: Normal breath sounds.  Musculoskeletal:        General: Normal range of motion.  Skin:    General: Skin is warm and dry.  Neurological:     Mental Status: He is alert.     Comments: CN 3-12 grossly intact, equal grip, intact finger to nose     Review of Systems - patient could not cooperate for questioning  Blood pressure 131/85, pulse 77, temperature 98.1 F (36.7 C), temperature source Oral, resp. rate 16, height 5\' 11"  (1.803 m), weight 91.2 kg, SpO2 100 %.Body mass index is 28.03 kg/m.  General Appearance: disheveled appearing, appears stated age  Eye Contact:  Fair  Speech:  Pressured  Volume:  Normal  Mood:  Elevated, anxious  Affect:  congruent  Thought Process:  Disorganized with frequent derailment  Orientation:  Oriented to self, city, , month, date, year  Thought Content:  Paranoid delusions that he has been injected with neurotoxins; grandiose delusions that he knows celebrities and works in Economist and on classified projects; Denies AVH but seen talking to himself on exam; denies ideas of reference or first rank symptoms  Suicidal Thoughts:  No  Homicidal Thoughts:  No  Memory:  Recent;   Poor  Judgement:  Impaired  Insight:  Lacking  Psychomotor Activity:  Fidgety during interview  Concentration:  Concentration: Poor and Attention Span: Poor  Recall:  Poor  Fund of Knowledge:  Fair  Language:  Good  Akathisia:  Negative  Assets:  Communication Skills Desire for Improvement Resilience  ADL's:  Independent  Cognition:  Impaired,  Mild secondary to psychosis/mania   Treatment Plan Summary: Diagnoses / Active Problems: Bipolar I MRE manic severe with psychotic features H/o perforated rectum s/p partial colectomy and colostomy  PLAN: 1. Safety and Monitoring:  --  Involuntary admission to inpatient psychiatric unit for safety, stabilization and treatment - 2nd opinion completed  -- Daily contact with patient to assess and evaluate symptoms and progress in treatment  -- Patient's case to be discussed in multi-disciplinary team meeting  -- Observation Level : q15 minute checks  -- Vital signs:  q12 hours  -- Precautions: suicide  2. Psychiatric Diagnoses and Treatment:  Bipolar I MRE manic severe with psychotic features -- Will attempt to get collateral from family  -- Patient refuses start of Lithium and has listed allergy of VPA in his records.   -- Various antipsychotic medications were discussed after review of his previous medication trials. My hope is to get him onto an oral agent that can be converted to [facility]. After discussion of r/b/se/a including risks of developing TD/EPS, weight gain, and potential development of DM and high cholesterol with use of atypical antipsychotics, he agrees to trial of Risperdal M-tabs. Will start with  now and  qhs.   -- Cogentin 0.5mg  bid PRN EPS/Tremor  -- Continue home dose of Neurontin  qid to help with pain and mood stabilization  -- Will start Klonopin 0.5mg  bid for antimanic properties of medication with goal of weaning off prior to discharge  -- Will scheduled Trazodone  qhs for sleep  -- Has PRN Vistaril  tid for anxiety  -- Agitation protocol - Ativan  po or IM q6 hours PRN and Haldol  po or IM q6 hours PRN  -- Metabolic profile and EKG monitoring obtained while on an atypical antipsychotic (BMI: 28.03; Lipid Panel:pending HbgA1c:pending; QTc:425ms)   -- Encouraged patient to participate in unit milieu and in scheduled group therapies   -- Short Term Goals: Ability to identify changes in lifestyle to reduce recurrence of condition will improve, Ability to demonstrate self-control will improve and Compliance with prescribed medications will improve  -- Long Term Goals: Improvement in  symptoms so as ready for discharge   3. Medical Issues Being Addressed:   Tobacco Use Disorder  -- Nicotine patch /24 hours ordered  -- Smoking cessation encouraged   H/o perforated rectum s/p partial colectomy and colostomy  -- will order colostomy supplies and assist as needed   Chronic pain syndrome  -- Holding Cymbalta in the event is contributing to mania  -- Continue Neurontin  qid\  -- Flexeril  bid PRN spasm  -- Continue Mobic 7.5mg  bid with food   Admission labs Reviewed: UDS negative; CMP WNL except for glucose 112; ETOH<10; CBC WNL; Salicylate <7; Tylenol <10; respiratory panel negative; TSH, HbgA1c and lipid panel pending; EKG NSR 70bpm QTc  4. Discharge Planning:   -- Social work and case management to assist with discharge planning and identification of [name] follow-up needs prior to discharge  -- Estimated LOS: 5-7 days  -- Discharge Concerns: Need to establish a safety plan; Medication compliance and effectiveness  -- Discharge Goals: Return home with outpatient referrals for mental health follow-up including medication management/psychotherapy  I certify that inpatient services furnished can reasonably be expected to improve the patient's condition.    Erman Locket, MD , Celene Skeen 4/12/202210:51 AM
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
## 2021-04-07 NOTE — Plan of Care (Signed)
  Problem: Education: Goal: Knowledge of [facility] Education information/materials will improve Outcome: Progressing Goal: Emotional status will improve Outcome: Progressing Goal: Mental status will improve Outcome: Progressing Goal: Verbalization of understanding the information provided will improve Outcome: Progressing   

## 2021-04-07 NOTE — Progress Notes (Signed)
Recreation Therapy Notes  Date: 4.12.22 Time: 7867-6720 Location: [address] Dayroom  Group Topic: Wellness  Goal Area(s) Addresses:  Patient will define components of whole wellness. Patient will verbalize benefit of whole wellness.  Intervention: Worksheet and Exercise   Activity: LRT and patients discussed the importance of being physically active and how exercise can have a positive effect on mental health.  Patients would then take turns leading the group in exercises of their choosing.  Patients were encouraged to do the best they could without staining themselves.  Patients could take breaks and get water as needed.  Education: Wellness, Building control surveyor.   Education Outcome: Acknowledges education/In group clarification offered/Needs additional education.   Clinical Observations/Feedback: Pt did not attend group session.    Caroll Rancher, LRT/CTRS    Caroll Rancher A 04/07/2021 10:53 AM

## 2021-04-07 NOTE — ED Notes (Signed)
Report given to [facility]  
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
## 2021-04-08 DIAGNOSIS — F312 Bipolar disorder, current episode manic severe with psychotic features: Secondary | ICD-10-CM | POA: Diagnosis not present

## 2021-04-08 MED ORDER — CARBAMAZEPINE 100 MG PO CHEW
100.0000 mg | CHEWABLE_TABLET | Freq: Two times a day (BID) | ORAL | Status: DC
  Administered 2021-04-08 – 2021-04-09 (×2): 100 mg via ORAL
  Filled 2021-04-08 (×4): qty 1

## 2021-04-08 MED ORDER — CARBAMAZEPINE 100 MG PO CHEW: 100.0000 mg | CHEWABLE_TABLET | Freq: Once | ORAL | Status: DC

## 2021-04-08 MED ORDER — CARBAMAZEPINE 100 MG PO CHEW
100.0000 mg | CHEWABLE_TABLET | Freq: Once | ORAL | Status: AC
  Administered 2021-04-08: 100 mg via ORAL
  Filled 2021-04-08: qty 1

## 2021-04-08 MED ORDER — CARBAMAZEPINE 100 MG PO CHEW: 100.0000 mg | CHEWABLE_TABLET | Freq: Two times a day (BID) | ORAL | Status: DC

## 2021-04-08 MED ORDER — RISPERIDONE 2 MG PO TBDP
2.0000 mg | ORAL_TABLET | Freq: Two times a day (BID) | ORAL | Status: DC
  Administered 2021-04-08 – 2021-04-10 (×5): 2 mg via ORAL
  Filled 2021-04-08 (×10): qty 1

## 2021-04-08 NOTE — Progress Notes (Signed)
D: Patient visible in the milieu, interacting with staff, denies SI/HI/AVH, states that his only complain today is pain that he has all over his body and rates it a 10 (10 being the worst). Pt reports that his sleep quality last night was good, and reports a good appetite, denies any other concerns D:Pt being given all meds as ordered, and is being maintained on Q15 minute checks for safety R:Will continue to monitor on Q15 minute safety checks   04/08/21 1034  Psych Admission Type (Psych Patients Only)  Admission Status Involuntary  Psychosocial Assessment  Patient Complaints None  Eye Contact Fair  Facial Expression Animated;Anxious;Pensive  Affect Anxious;Preoccupied  Speech Rapid;Pressured;English as a second language teacher;Hyperactive  Appearance/Hygiene Poor hygiene;In scrubs  Thought Process  Coherency Disorganized;Tangential  Content Obsessions;Preoccupation;Delusions  Delusions Paranoid  Perception WDL  Hallucination None reported or observed  Judgment Poor  Confusion Mild  Danger to Self  Current suicidal ideation? Denies  Danger to Others  Danger to Others None reported or observed
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
## 2021-04-08 NOTE — Progress Notes (Signed)
Recreation Therapy Notes  Date: 4.13.22 Time: 1015 Location: 500 Hall Dayroom  Group Topic: Triggers  Goal Area(s) Addresses:  Patient will identify triggers.  Patient will verbalize how to deal with triggers. Patient will verbalize how triggers can be dealt with post d/c.  Intervention: Worksheet  Activity: Triggers.  Patients were to identify their three biggest triggers.  Patient would then identify how to avoid those triggers and how to deal with them when they can't be avoided.  Education: Communication, Discharge Planning  Education Outcome: Acknowledges understanding/In group clarification offered/Needs additional education.   Clinical Observations/Feedback: Pt did not attend group session.      Caroll Rancher, LRT/CTRS         Caroll Rancher A 04/08/2021 1:09 PM

## 2021-04-08 NOTE — Tx Team (Signed)
Interdisciplinary Treatment and Diagnostic Plan Update  04/08/2021 Time of Session:  [name] [name] MRN: [id number]  Principal Diagnosis: Bipolar I disorder, current or most recent episode manic, with psychotic features ([place])  Secondary Diagnoses: Principal Problem:   Bipolar I disorder, current or most recent episode manic, with psychotic features ([place]) Active Problems:   Colostomy in place [facility])   Chronic pain syndrome   Tobacco abuse   Current Medications:  Current Facility-Administered Medications  Medication Dose Route Frequency Provider Last Rate Last Admin  . acetaminophen (TYLENOL) tablet 650 mg  650 mg Oral Q6H PRN Nwoko, Uchenna E, PA   650 mg at 04/07/21 2108  . alum & mag hydroxide-simeth (MAALOX/MYLANTA) 200-200-20 MG/5ML suspension 30 mL  30 mL Oral Q4H PRN Nwoko, Uchenna E, PA      . carbamazepine (TEGRETOL) chewable tablet 100 mg  100 mg Oral Once Nelda Marseille, Amy E, MD      . carbamazepine (TEGRETOL) chewable tablet 100 mg  100 mg Oral BID Nelda Marseille, Amy E, MD      . clonazePAM [facility] [place]) tablet 0.5 mg  0.5 mg Oral BID Nelda Marseille, Amy E, MD   0.5 mg at 04/08/21 0826  . cyclobenzaprine (FLEXERIL) tablet 10 mg  10 mg Oral BID PRN Nwoko, Uchenna E, PA   10 mg at 04/07/21 2108  . gabapentin (NEURONTIN) capsule 400 mg  400 mg Oral QID Nwoko, Uchenna E, PA   400 mg at 04/08/21 1212  . hydrOXYzine (ATARAX/VISTARIL) tablet 25 mg  25 mg Oral TID PRN Nwoko, Uchenna E, PA   25 mg at 04/07/21 2108  . magnesium hydroxide (MILK OF MAGNESIA) suspension 30 mL  30 mL Oral Daily PRN Nwoko, Uchenna E, PA      . meloxicam (MOBIC) tablet 7.5 mg  7.5 mg Oral BID Nelda Marseille, Amy E, MD   7.5 mg at 04/08/21 0827  . nicotine (NICODERM CQ - dosed in mg/24 hours) patch 21 mg  21 mg Transdermal Daily Nwoko, Uchenna E, PA   21 mg at 04/08/21 0830  . risperiDONE (RISPERDAL M-TABS) disintegrating tablet 2 mg  2 mg Oral BID Harlow Asa, MD   2 mg at 04/08/21 0826  . traZODone (DESYREL) tablet 100 mg   100 mg Oral QHS Viann Fish E, MD   100 mg at 04/07/21 2108   PTA Medications: Medications Prior to Admission  Medication Sig Dispense Refill Last Dose  . clonazePAM (KLONOPIN) 2 MG tablet Take 2 mg by mouth 3 (three) times daily as needed for anxiety. (Patient not taking: Reported on 04/06/2021)     . cyclobenzaprine (FLEXERIL) 10 MG tablet Take 1 tablet (10 mg total) by mouth 2 (two) times daily as needed for muscle spasms. 60 tablet 1   . DULoxetine (CYMBALTA) 60 MG capsule Take 1 capsule (60 mg total) by mouth daily. 60 capsule 5   . gabapentin (NEURONTIN) 400 MG capsule TAKE (1) CAPSULE BY MOUTH FOUR TIMES DAILY. 120 capsule 0   . meloxicam (MOBIC) 7.5 MG tablet Take 1 tablet (7.5 mg total) by mouth 2 (two) times daily. 60 tablet 2   . ondansetron (ZOFRAN-ODT) 4 MG disintegrating tablet Take 1 tablet (4 mg total) by mouth every 8 (eight) hours as needed for nausea or vomiting. (Patient not taking: Reported on 04/06/2021) 20 tablet 0   . zolpidem (AMBIEN) 10 MG tablet Take 10 mg by mouth daily as needed. (Patient not taking: Reported on 04/06/2021)       Patient Stressors: Medication change  or noncompliance Other: delusions  Patient Strengths: Average or above average intelligence Capable of independent living Supportive family/friends  Treatment Modalities: Medication Management, Group therapy, Case management,  1 to 1 session with clinician, Psychoeducation, Recreational therapy.   Physician Treatment Plan for Primary Diagnosis: Bipolar I disorder, current or most recent episode manic, with psychotic features ([place]) Long Term Goal(s): Improvement in symptoms so as ready for discharge   Short Term Goals: Ability to identify changes in lifestyle to reduce recurrence of condition will improve Ability to demonstrate self-control will improve Compliance with prescribed medications will improve  Medication Management: Evaluate patient's response, side effects, and tolerance of  medication regimen.  Therapeutic Interventions: 1 to 1 sessions, Unit Group sessions and Medication administration.  Evaluation of Outcomes: Not Met  Physician Treatment Plan for Secondary Diagnosis: Principal Problem:   Bipolar I disorder, current or most recent episode manic, with psychotic features ([place]) Active Problems:   Colostomy in place [facility])   Chronic pain syndrome   Tobacco abuse  Long Term Goal(s): Improvement in symptoms so as ready for discharge   Short Term Goals: Ability to identify changes in lifestyle to reduce recurrence of condition will improve Ability to demonstrate self-control will improve Compliance with prescribed medications will improve     Medication Management: Evaluate patient's response, side effects, and tolerance of medication regimen.  Therapeutic Interventions: 1 to 1 sessions, Unit Group sessions and Medication administration.  Evaluation of Outcomes: Not Met   RN Treatment Plan for Primary Diagnosis: Bipolar I disorder, current or most recent episode manic, with psychotic features ([place]) Long Term Goal(s): Knowledge of disease and therapeutic regimen to maintain health will improve  Short Term Goals: Ability to verbalize frustration and anger appropriately will improve, Ability to demonstrate self-control and Ability to participate in decision making will improve  Medication Management: RN will administer medications as ordered by provider, will assess and evaluate patient's response and provide education to patient for prescribed medication. RN will report any adverse and/or side effects to prescribing provider.  Therapeutic Interventions: 1 on 1 counseling sessions, Psychoeducation, Medication administration, Evaluate responses to treatment, Monitor vital signs and CBGs as ordered, Perform/monitor CIWA, COWS, AIMS and Fall Risk screenings as ordered, Perform wound care treatments as ordered.  Evaluation of Outcomes: Not Met   LCSW Treatment  Plan for Primary Diagnosis: Bipolar I disorder, current or most recent episode manic, with psychotic features ([place]) Long Term Goal(s): Safe transition to appropriate next level of care at discharge, Engage patient in therapeutic group addressing interpersonal concerns.  Short Term Goals: Engage patient in aftercare planning with referrals and resources, Increase social support and Increase ability to appropriately verbalize feelings  Therapeutic Interventions: Assess for all discharge needs, 1 to 1 time with Social worker, Explore available resources and support systems, Assess for adequacy in community support network, Educate family and significant other(s) on suicide prevention, Complete Psychosocial Assessment, Interpersonal group therapy.  Evaluation of Outcomes: Not Met   Progress in Treatment: Attending groups: Yes. Participating in groups: Yes. Taking medication as prescribed: Yes. Toleration medication: Yes. Family/Significant other contact made: Yes, individual(s) contacted:  mother Patient understands diagnosis: Yes. Discussing patient identified problems/goals with staff: Yes. Medical problems stabilized or resolved: Yes. Denies suicidal/homicidal ideation: Yes. Issues/concerns per patient self-inventory: No. Other: None  New problem(s) identified: No, Describe:  None  New Short Term/Long Term Goal(s):medication stabilization, elimination of SI thoughts, development of comprehensive mental wellness plan.  Patient Goals:  "try and sleep."  Discharge Plan or Barriers: Patient  recently admitted. CSW will continue to follow and assess for appropriate referrals and possible discharge planning.  Reason for Continuation of Hospitalization: Delusions  Mania Medication stabilization  Estimated Length of Stay: 3-5 days  Attendees: Patient: [name] [name] 04/08/2021   Physician: Dr. Jeneen Rinks 04/08/2021   Nursing:  04/08/2021   RN Care Manager: 04/08/2021   Social Worker: Toney Reil, [place] 04/08/2021   Recreational Therapist:  04/08/2021   Other:  04/08/2021   Other:  04/08/2021   Other: 04/08/2021       Scribe for Treatment Team: Mliss Fritz, Latanya Presser 04/08/2021 3:03 PM

## 2021-04-08 NOTE — Progress Notes (Signed)
Pt kept to himself much of the evening. Pt stated he "just came here to get some sleep" pt stated he was doing better because he was sleeping    04/08/21 2300  Psych Admission Type (Psych Patients Only)  Admission Status Involuntary  Psychosocial Assessment  Patient Complaints None  Eye Contact Fair  Facial Expression Animated;Anxious;Pensive  Affect Anxious;Preoccupied  Speech Rapid;Pressured;English as a second language teacher;Hyperactive  Appearance/Hygiene Poor hygiene;In scrubs  Behavior Characteristics Anxious;Cooperative  Mood Suspicious;Anxious  Thought Process  Coherency Disorganized;Tangential  Content Obsessions;Preoccupation;Delusions  Delusions Paranoid  Perception WDL  Hallucination None reported or observed  Judgment Poor  Confusion Mild  Danger to Self  Current suicidal ideation? Denies  Danger to Others  Danger to Others None reported or observed
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
## 2021-04-08 NOTE — [facility] Group Notes (Signed)
LCSW Group Therapy 04/08/2021 1:15pm  Type of Therapy and Topic:  Group Therapy:  Change and Accountability  Participation Level:  Active  Description of Group In this group, patients discussed power and accountability for change.  The group identified the challenges related to accountability and the difficulty of accepting the outcomes of negative behaviors.  Patients were encouraged to openly discuss a challenge/change they could take responsibility for.  Patients discussed the use of "change talk" and positive thinking as ways to support achievement of personal goals.  The group discussed ways to give support and empowerment to peers.  Therapeutic Goals: 1. Patients will state the relationship between personal power and accountability in the change process 2. Patients will identify the positive and negative consequences of a personal choice they have made 3. Patients will identify one challenge/choice they will take responsibility for making 4. Patients will discuss the role of "change talk" and the impact of positive thinking as it supports successful personal change 5. Patients will verbalize support and affirmation of change efforts in peers  Summary of Patient Progress:  Patient attended group. Patient was able to participate in group, however was off topic and labile.    Therapeutic Modalities Solution Focused Brief Therapy Motivational Interviewing Cognitive Behavioral Therapy    Otelia Santee, LCSW 04/08/2021 2:02 PM

## 2021-04-09 DIAGNOSIS — F312 Bipolar disorder, current episode manic severe with psychotic features: Secondary | ICD-10-CM | POA: Diagnosis not present

## 2021-04-09 LAB — LIPID PANEL
Cholesterol: 219 mg/dL — ABNORMAL HIGH (ref 0–200)
HDL: 52 mg/dL (ref >40)
LDL Cholesterol: 135 mg/dL — ABNORMAL HIGH (ref 0–99)
Total CHOL/HDL Ratio: 4.2 RATIO
Triglycerides: 159 mg/dL — ABNORMAL HIGH (ref <150)
VLDL: 32 mg/dL (ref 0–40)

## 2021-04-09 MED ORDER — ONDANSETRON HCL 4 MG PO TABS
4.0000 mg | ORAL_TABLET | Freq: Three times a day (TID) | ORAL | Status: DC | PRN
  Administered 2021-04-09 – 2021-04-12 (×3): 4 mg via ORAL
  Filled 2021-04-09 (×3): qty 1

## 2021-04-09 MED ORDER — CARBAMAZEPINE 100 MG PO CHEW
100.0000 mg | CHEWABLE_TABLET | Freq: Once | ORAL | Status: AC
  Administered 2021-04-09: 100 mg via ORAL
  Filled 2021-04-09: qty 1

## 2021-04-09 MED ORDER — CARBAMAZEPINE 100 MG PO CHEW
200.0000 mg | CHEWABLE_TABLET | Freq: Two times a day (BID) | ORAL | Status: DC
  Administered 2021-04-09 – 2021-04-14 (×10): 200 mg via ORAL
  Filled 2021-04-09 (×14): qty 2

## 2021-04-09 NOTE — Progress Notes (Addendum)
[facility]  04/09/2021 10:27 AM [name] MaxinMark D Jamison  MRN:  [id number]   Chief complaint: mania with psychosis  Subjective:  [name] [name] is a 55 y.o. male with a history of bipolar I d/o, who was initially admitted for inpatient psychiatric hospitalization on 04/07/2021 for management of acute mania with psychosis. The patient is currently on Hospital Day 2.   Chart Review from last 24 hours:  The patient's chart was reviewed and nursing notes were reviewed. The patient's case was discussed in multidisciplinary team meeting. Per nursing, the patient remained pressured and delusional but no behavioral issues noted. Per MAR, he was compliant with scheduled medications. He received Vistaril X1 for anxiety.   Information Obtained Today During Patient Interview: The patient was seen and evaluated on the unit. He states he is sleeping well and his mood is "pretty good" today. He denies medication side-effects and voices no physical complaints. He reports good appetite. He denies SI, HI, AVH, or ideas of reference. He continues to make delusional statements about being involved in research, talking to celebrities on social media, and working for the [employer]. Overall, he is less pressured today and requires less redirection on exam. When questioned about previous TBI, he states he had a Head CT recently and is supposed to f/u with neurology as an outpatient. He denies focal neurologic complaints.   Principal Problem: Bipolar I disorder, current or most recent episode manic, with psychotic features (HCC) Diagnosis: Principal Problem:   Bipolar I disorder, current or most recent episode manic, with psychotic features (HCC) Active Problems:   Colostomy in place [facility](HCC)   Chronic pain syndrome   Tobacco abuse  Total Time Spent in Direct Patient Care:  I personally spent 30 minutes on the unit in direct patient care. The direct patient care time included face-to-face time with the patient,  reviewing the patient's chart, communicating with other professionals, and coordinating care. Greater than 50% of this time was spent in counseling or coordinating care with the patient regarding goals of hospitalization, psycho-education, and discharge planning needs.  Past Psychiatric History: see admission H&P  Past Medical History:  Past Medical History:  Diagnosis Date  . Anxiety   . Bipolar 1 disorder (HCC)   . Chronic fatigue   . Chronic pain   . Complete intestinal obstruction (HCC)   . Depression    Phreesia 12/03/2020  . Fecal peritonitis (HCC) 08/03/2019  . Fibromyalgia   . Hyperlipidemia    Phreesia 12/03/2020  . Ileus, postoperative (HCC)   . Perforated rectum (HCC) 08/03/2019    Past Surgical History:  Procedure Laterality Date  . COLON RESECTION SIGMOID  08/03/2019   Procedure: COLON RESECTION SIGMOID;  Surgeon: Lucretia RoersBridges, Lindsay C, MD;  Location: [facility] ORS;  Service: General;;  . COLOSTOMY N/A 08/03/2019   Procedure: COLOSTOMY;  Surgeon: Lucretia RoersBridges, Lindsay C, MD;  Location: [facility] ORS;  Service: General;  Laterality: N/A;  . FLEXIBLE SIGMOIDOSCOPY N/A 08/03/2019   Procedure: FLEXIBLE SIGMOIDOSCOPY;  Surgeon: Corbin Adeourk, Robert M, MD;  Location: [facility] ENDO SUITE;  Service: Endoscopy;  Laterality: N/A;  . KNEE ARTHROSCOPY Left 2006   miniscus tear  . LAPAROTOMY N/A 08/03/2019   Procedure: EXPLORATORY LAPAROTOMY;  Surgeon: Lucretia RoersBridges, Lindsay C, MD;  Location: [facility] ORS;  Service: General;  Laterality: N/A;  . LYMPH GLAND EXCISION Left 1983  . SMALL INTESTINE SURGERY N/A    Phreesia 12/03/2020  . TONSILLECTOMY     Family History:  Family History  Problem Relation Age  of Onset  . Diabetes Mother   . Heart attack Father   . Diabetes Maternal Grandmother   . Colon cancer Neg Hx   . Colon polyps Neg Hx    Family Psychiatric  History: see admission H&P  Social History:  Social History   Substance and Sexual Activity  Alcohol Use Not Currently   Comment: occasional; denied 10/02/19      Social History   Substance and Sexual Activity  Drug Use Not Currently  . Types: Cocaine   Comment: last used in December 2020.     Social History   Socioeconomic History  . Marital status: Legally Separated    Spouse name: Not on file  . Number of children: Not on file  . Years of education: Not on file  . Highest education level: Not on file  Occupational History  . Not on file  Tobacco Use  . Smoking status: Current Every Day Smoker    Packs/day: 1.00    Years: 10.00    Pack years: 10.00  . Smokeless tobacco: Never Used  Vaping Use  . Vaping Use: Some days  Substance and Sexual Activity  . Alcohol use: Not Currently    Comment: occasional; denied 10/02/19  . Drug use: Not Currently    Types: Cocaine    Comment: last used in December 2020.   Marland Kitchen Sexual activity: Not Currently  Other Topics Concern  . Not on file  Social History Narrative   Pt lives alone and is on disability. Counts his mother, sister and aunt as his social supports.    Social Determinants of Health   Financial Resource Strain: Not on file  Food Insecurity: Not on file  Transportation Needs: Not on file  Physical Activity: Not on file  Stress: Not on file  Social Connections: Not on file   Sleep: Good  Appetite:  Good  Current Medications: Current Facility-Administered Medications  Medication Dose Route Frequency Provider Last Rate Last Admin  . acetaminophen (TYLENOL) tablet 650 mg  650 mg Oral Q6H PRN Nwoko, Uchenna E, PA   650 mg at 04/07/21 2108  . alum & mag hydroxide-simeth (MAALOX/MYLANTA) 200-200-20 MG/5ML suspension 30 mL  30 mL Oral Q4H PRN Nwoko, Uchenna E, PA      . carbamazepine (TEGRETOL) chewable tablet 100 mg  100 mg Oral Once Mason Jim, Dorcus Riga E, MD      . carbamazepine (TEGRETOL) chewable tablet 200 mg  200 mg Oral BID Mason Jim, Arbutus Nelligan E, MD      . clonazePAM Scarlette Calico) tablet 0.5 mg  0.5 mg Oral BID Mason Jim, Sollie Vultaggio E, MD   0.5 mg at 04/09/21 0830  . cyclobenzaprine (FLEXERIL)  tablet 10 mg  10 mg Oral BID PRN Nwoko, Uchenna E, PA   10 mg at 04/07/21 2108  . gabapentin (NEURONTIN) capsule 400 mg  400 mg Oral QID Nwoko, Uchenna E, PA   400 mg at 04/09/21 0830  . hydrOXYzine (ATARAX/VISTARIL) tablet 25 mg  25 mg Oral TID PRN Nwoko, Uchenna E, PA   25 mg at 04/08/21 2105  . magnesium hydroxide (MILK OF MAGNESIA) suspension 30 mL  30 mL Oral Daily PRN Nwoko, Uchenna E, PA      . meloxicam (MOBIC) tablet 7.5 mg  7.5 mg Oral BID Mason Jim, Tytiana Coles E, MD   7.5 mg at 04/09/21 0830  . nicotine (NICODERM CQ - dosed in mg/24 hours) patch 21 mg  21 mg Transdermal Daily Nwoko, Uchenna E, PA   21 mg at 04/09/21  [id number]  . risperiDONE (RISPERDAL M-TABS) disintegrating tablet 2 mg  2 mg Oral BID Gallaga Locket, MD   2 mg at 04/09/21 0829  . traZODone (DESYREL) tablet 100 mg  100 mg Oral QHS Mason Jim, Vaishnav Demartin E, MD   100 mg at 04/08/21 2105    Lab Results:  Results for orders placed or performed during the hospital encounter of 04/07/21 (from the past 48 hour(s))  Hemoglobin A1c     Status: None   Collection Time: 04/07/21  6:38 PM  Result Value Ref Range   Hgb A1c MFr Bld 5.4 4.8 - 5.6 %    Comment: (NOTE) Pre diabetes:          5.7%-6.4%  Diabetes:              >6.4%  Glycemic control for   <7.0% adults with diabetes    Mean Plasma Glucose 108.28 mg/dL    Comment: Performed at [facility], [address] [place], [place] [address]  Lipid panel     Status: Abnormal   Collection Time: 04/07/21  6:38 PM  Result Value Ref Range   Cholesterol 212 (H) 0 - 200 mg/dL   Triglycerides 102 (H) <150 mg/dL   HDL 48 >72 mg/dL   Total CHOL/HDL Ratio 4.4 RATIO   VLDL 61 (H) 0 - 40 mg/dL   LDL Cholesterol 536 (H) 0 - 99 mg/dL    Comment:        Total Cholesterol/HDL:CHD Risk Coronary Heart Disease Risk Table                     Men   Women  1/2 Average Risk   3.4   3.3  Average Risk       5.0   4.4  2 X Average Risk   9.6   7.1  3 X Average Risk  23.4   11.0        Use the  calculated Patient Ratio above and the CHD Risk Table to determine the patient's CHD Risk.        ATP III CLASSIFICATION (LDL):  <100     mg/dL   Optimal  644-034  mg/dL   Near or Above                    Optimal  130-159  mg/dL   Borderline  742-595  mg/dL   High  >638     mg/dL   Very High Performed at [facility], [address] [place], [place] [address]   TSH     Status: None   Collection Time: 04/07/21  6:38 PM  Result Value Ref Range   TSH 1.999 0.350 - 4.500 uIU/mL    Comment: Performed by a 3rd Generation assay with a functional sensitivity of <=0.01 uIU/mL. Performed at [facility], [address] [place], [place] [address]   Lipid panel     Status: Abnormal   Collection Time: 04/09/21  6:38 AM  Result Value Ref Range   Cholesterol 219 (H) 0 - 200 mg/dL   Triglycerides 884 (H) <150 mg/dL   HDL 52 >16 mg/dL   Total CHOL/HDL Ratio 4.2 RATIO   VLDL 32 0 - 40 mg/dL   LDL Cholesterol 606 (H) 0 - 99 mg/dL    Comment:        Total Cholesterol/HDL:CHD Risk Coronary Heart Disease Risk Table  Men   Women  1/2 Average Risk   3.4   3.3  Average Risk       5.0   4.4  2 X Average Risk   9.6   7.1  3 X Average Risk  23.4   11.0        Use the calculated Patient Ratio above and the CHD Risk Table to determine the patient's CHD Risk.        ATP III CLASSIFICATION (LDL):  <100     mg/dL   Optimal  623-762  mg/dL   Near or Above                    Optimal  130-159  mg/dL   Borderline  831-517  mg/dL   High  >616     mg/dL   Very High Performed at [facility], [address] [place], [place] [address]     Blood Alcohol level:  Lab Results  Component Value Date   ETH <10 04/06/2021   ETH <10 07/17/2020    Metabolic Disorder Labs: Lab Results  Component Value Date   HGBA1C 5.4 04/07/2021   MPG 108.28 04/07/2021   No results found for: PROLACTIN Lab Results  Component Value Date    CHOL 219 (H) 04/09/2021   TRIG 159 (H) 04/09/2021   HDL 52 04/09/2021   CHOLHDL 4.2 04/09/2021   VLDL 32 04/09/2021   LDLCALC 135 (H) 04/09/2021   LDLCALC 103 (H) 04/07/2021    Physical Findings: AIMS: Facial and Oral Movements Muscles of Facial Expression: None, normal Lips and Perioral Area: None, normal Jaw: None, normal Tongue: None, normal,Extremity Movements Upper (arms, wrists, hands, fingers): None, normal Lower (legs, knees, ankles, toes): None, normal, Trunk Movements Neck, shoulders, hips: None, normal, Overall Severity Severity of abnormal movements (highest score from questions above): None, normal Incapacitation due to abnormal movements: None, normal Patient's awareness of abnormal movements (rate only patient's report): No Awareness, Dental Status Current problems with teeth and/or dentures?: No Does patient usually wear dentures?: No      Musculoskeletal: Strength & Muscle Tone: within normal limits Gait & Station: untested today - resting in bed Patient leans: N/A  Psychiatric Specialty Exam: Physical Exam Vitals reviewed.  HENT:     Head: Normocephalic.  Pulmonary:     Effort: Pulmonary effort is normal.  Neurological:     General: No focal deficit present.     Mental Status: He is alert.     Review of Systems  Respiratory: Negative for shortness of breath.   Cardiovascular: Negative for chest pain.  Gastrointestinal: Negative for constipation, diarrhea, nausea and vomiting.    Blood pressure 132/80, pulse (!) 114, temperature 98 F (36.7 C), temperature source Oral, resp. rate 16, height 5\' 11"  (1.803 m), weight 91.2 kg, SpO2 97 %.Body mass index is 28.03 kg/m.  General Appearance: disheveled, wearing scrubs  Eye Contact:  Good  Speech:  Less pressured today, clear and coherent  Volume:  Normal  Mood:  Described as "pretty good" - appears less elevated and less anxious today  Affect:  Calmer, bright  Thought Process:  More linear today with  direct questions; less tangential  Orientation:  Oriented to month, year, President  Thought Content:  Continued grandiose delusional statements on exam; no gross response to internal/external stimuli on exam; less paranoid appearing  Suicidal Thoughts:  No  Homicidal Thoughts:  No  Memory:  Recent;   Poor  Judgement:  Impaired  Insight:  Lacking  Psychomotor Activity: Normal - no evidence of akathisia or tremor  Concentration: Improving  Recall:  Poor  Fund of Knowledge:  Fair  Language:  Fair  Akathisia:  Negative  Assets:  Communication Skills Desire for Improvement Resilience Social Support  ADL's:  independent  Cognition:  Impaired,  Mild secondary to psychosis/mania  Sleep:  Number of Hours: 8.25   Treatment Plan Summary: Diagnoses / Active Problems: Bipolar I MRE manic severe with psychotic features H/o perforated rectum s/p partial colectomy and colostomy  PLAN: 1. Safety and Monitoring:             -- Involuntary admission to inpatient psychiatric unit for safety, stabilization and treatment - 2nd opinion completed             -- Daily contact with patient to assess and evaluate symptoms and progress in treatment             -- Patient's case to be discussed in multi-disciplinary team meeting             -- Observation Level : q15 minute checks             -- Vital signs:  q12 hours             -- Precautions: suicide  2. Psychiatric Diagnoses and Treatment:  Bipolar I MRE manic severe with psychotic features -- Increase Tegretol to  bid (will check Tegretol level, LFTs, and CBC on Monday; Admission WBC 10.3, ALT 21, AST 22)             -- Continue Risperdal M tabs  bid             -- Cogentin 0.5mg  bid PRN EPS/Tremor             -- Continue home dose of Neurontin  qid to help with pain and mood stabilization             -- Continue Klonopin 0.5mg  bid for antimanic properties of medication with goal of weaning off prior to discharge             --  Continue Trazodone  qhs for sleep             -- Has PRN Vistaril  tid for anxiety             -- Agitation protocol - Ativan  po or IM q6 hours PRN and Haldol  po or IM q6 hours PRN             -- Metabolic profile and EKG monitoring obtained while on an atypical antipsychotic (BMI: 28.03; Lipid Panel:cholesterol 212, triglycerides 307, HDL 48, LDL 103; HbgA1c:5.4; QTc:424ms)              -- Encouraged patient to participate in unit milieu and in scheduled group therapies              -- Short Term Goals: Ability to identify changes in lifestyle to reduce recurrence of condition will improve, Ability to demonstrate self-control will improve and Compliance with prescribed medications will improve             -- Long Term Goals: Improvement in symptoms so as ready for discharge              3. Medical Issues Being Addressed:              Tobacco Use Disorder             --  Nicotine patch 21mg /24 hours ordered             -- Smoking cessation encouraged              H/o perforated rectum s/p partial colectomy and colostomy             -- will order colostomy supplies and assist as needed              Chronic pain syndrome             -- Holding Cymbalta in the event is contributing to mania             -- Continue Neurontin 400mg  qid             -- Flexeril 10mg  bid PRN spasm             -- Continue Mobic 7.5mg  bid with food   Hyperlipidemia/Hypertriglyceridemia  -- Repeat fasting labs 04/09/21: Cholesterol 219, triglycerides 159, HDL 52, LDL 135  -- Will encourage dietary changes, increased exercise and repeat lab monitoring with PCP as an outpatient   Based on Collateral from Mother obtained by SW, there is concern for possible recent TBI  -- Cancelled Head CT since he already had CT scan 12/21 which showed normal appearance of the brain with non-acute right nasal arch and medial left orbit fractures which have occurred since 2015  -- Would benefit from Neurology f/u after  discharge   4. Discharge Planning:              -- Social work and case management to assist with discharge planning and identification of hospital follow-up needs prior to discharge             -- Estimated LOS: 5-7 days             -- Discharge Concerns: Need to establish a safety plan; Medication compliance and effectiveness             -- Discharge Goals: Return home with outpatient referrals for mental health follow-up including medication management/psychotherapy  I certify that inpatient services furnished can reasonably be expected to improve the patient's condition.     04/11/21, MD, FAPA 04/09/2021, 10:27 AM

## 2021-04-09 NOTE — [facility] Group Notes (Signed)
Occupational Therapy Group Note Date: 04/09/2021 Group Topic/Focus: Feelings Management  Group Description: Group encouraged increased engagement and participation through discussion focused on Building Happiness. Patients were provided a handout and reviewed therapeutic strategies to build happiness including identifying gratitudes, random acts of kindness, exercise, meditation, positive journaling, and fostering relationships. Patients engaged in discussion and encouraged to reflect on each strategy and their experiences. Participation Level: Patient did not attend OT group session despite personal invitation from MHT.    Plan: Continue to engage patient in OT groups 2 - 3x/week.  04/09/2021  Stephane Junkins, MOT, OTR/L   

## 2021-04-09 NOTE — Progress Notes (Signed)
Recreation Therapy Notes  Date: 4.14.22 Time: 1000 Location: 500 Hall Dayroom  Group Topic: Self-Esteem  Goal Area(s) Addresses:  Patient will successfully identify positive attributes about themselves.  Patient will successfully identify benefit of improved self-esteem.   Intervention: Administrator, sports, Markers  Activity: Look In Pensions consultant.  Patients were to picture looking at themselves in the mirror and highlight the positive qualities about themselves.  Patients were encouraged to look deep because it's easy to think of the negative qualities and sometimes it's difficult to find the positive.    Education:  Self-Esteem, Discharge Planning  Education Outcome: Acknowledges education/In group clarification offered/Needs additional education  Clinical Observations/Feedback: Pt did not attend group.    Caroll Rancher, LRT/CTRS         Caroll Rancher A 04/09/2021 11:24 AM

## 2021-04-09 NOTE — Progress Notes (Signed)
Pt given PRN Vistaril and Zofran per MAR with HS medications. Pt stated something at dinner upset his stomach. Pt stated he felt much better since coming in here. Pt stated he would like to do outpatient at [facility] , because he has been treated so well.    04/09/21 2200  Psych Admission Type (Psych Patients Only)  Admission Status Involuntary  Psychosocial Assessment  Patient Complaints None  Eye Contact Fair  Facial Expression Animated;Anxious;Pensive  Affect Anxious;Preoccupied  Speech Rapid;Pressured;English as a second language teacher;Hyperactive  Appearance/Hygiene Poor hygiene;In scrubs  Behavior Characteristics Cooperative  Mood Suspicious  Thought Process  Coherency Disorganized;Tangential  Content Obsessions;Preoccupation;Delusions  Delusions Paranoid  Perception WDL  Hallucination None reported or observed  Judgment Poor  Confusion Mild  Danger to Self  Current suicidal ideation? Denies  Danger to Others  Danger to Others None reported or observed

## 2021-04-09 NOTE — Progress Notes (Signed)
   04/09/21 0500  Sleep  Number of Hours 8.25

## 2021-04-10 DIAGNOSIS — F312 Bipolar disorder, current episode manic severe with psychotic features: Secondary | ICD-10-CM | POA: Diagnosis not present

## 2021-04-10 MED ORDER — RISPERIDONE 3 MG PO TBDP
3.0000 mg | ORAL_TABLET | Freq: Every day | ORAL | Status: DC
  Administered 2021-04-10 – 2021-04-13 (×4): 3 mg via ORAL
  Filled 2021-04-10 (×6): qty 1

## 2021-04-10 MED ORDER — RISPERIDONE 2 MG PO TBDP
2.0000 mg | ORAL_TABLET | Freq: Every day | ORAL | Status: DC
  Filled 2021-04-10 (×2): qty 1

## 2021-04-10 NOTE — Progress Notes (Signed)
Pt kept to himself much of the evening. Pt stated he was doing fine, expected to D/C in the next few days per pt . Pt given PRN Vistaril per MAR with HS medications    04/10/21 2100  Psych Admission Type (Psych Patients Only)  Admission Status Involuntary  Psychosocial Assessment  Patient Complaints None  Eye Contact Fair  Facial Expression Animated;Anxious;Pensive  Affect Anxious;Preoccupied  Speech Rapid;Pressured;English as a second language teacher;Hyperactive  Appearance/Hygiene Poor hygiene;In scrubs  Behavior Characteristics Cooperative  Mood Anxious;Suspicious  Thought Process  Coherency Disorganized;Tangential  Content Obsessions;Preoccupation;Delusions  Delusions Paranoid  Perception WDL  Hallucination None reported or observed  Judgment Poor  Confusion Mild  Danger to Self  Current suicidal ideation? Denies  Danger to Others  Danger to Others None reported or observed

## 2021-04-10 NOTE — Progress Notes (Signed)
Recreation Therapy Notes  Date: 4.15.22 Time: 1000 Location: 500 Hallway  Group Topic: Communication, Team Building, Problem Solving  Goal Area(s) Addresses:  Patient will effectively work with peer towards shared goal.  Patient will identify skills used to make activity successful.  Patient will identify how skills used during activity can be used to reach post d/c goals.   Intervention: Teambuilding Activity  Activity: [employer].  Patients were asked to use colored discs to get the entire team from one end of the hall way to the other. Patients were only allowed to move down and back the hallway by stepping on the discs, patient team was provided 1 additional disc to assist with them completing task.    Education: Pharmacist, community, Scientist, physiological, Discharge Planning   Education Outcome: Acknowledges education.   Clinical Observations/Feedback: Pt did not attend group session.    Caroll Rancher, LRT/CTRS         Caroll Rancher A 04/10/2021 11:16 AM

## 2021-04-10 NOTE — Progress Notes (Addendum)
[facility] MD Progress Note  04/10/2021 2:01 PM [name] [name]  MRN:  [id number]   Chief complaint: mania with psychosis  Subjective:  [name] [name] is a 55 y.o. male with a history of bipolar I d/o, who was initially admitted for inpatient psychiatric hospitalization on 04/07/2021 for management of acute mania with psychosis. The patient is currently on Hospital Day 3.   Chart Review from last 24 hours:  The patient's chart was reviewed and nursing notes were reviewed. The patient's case was discussed in multidisciplinary team meeting. Per nursing, he had some stomach upset last night and required PRN zofran. He did not attend groups. No behavioral issues noted but he was reportedly still pressured and delusional. Per MAR he was compliant with scheduled medications and received Vistaril X1 for anxiety.   Information Obtained Today During Patient Interview: The patient was seen and evaluated on the unit. He states he is sleeping and eating well and denies medication side-effects. He states his mood is "good" and he denies SI or HI. He denies AVH, paranoia, ideas of reference, or first rank symptoms. He derails during conversation into how he is working with [employer] and a Clinical research associate about money he owes on the house he just sold. He makes delusional statements about doing "doppler research" and "hanging out with girls from [facility]." He continues to endorse belief that he communicates with celebrities on social media. He voices no physical complaints and was prompted to shower and attend to his hygiene.   Principal Problem: Bipolar I disorder, current or most recent episode manic, with psychotic features (HCC) Diagnosis: Principal Problem:   Bipolar I disorder, current or most recent episode manic, with psychotic features (HCC) Active Problems:   Colostomy in place [facility])   Chronic pain syndrome   Tobacco abuse  Total Time Spent in Direct Patient Care:  I personally spent 25 minutes on the unit in  direct patient care. The direct patient care time included face-to-face time with the patient, reviewing the patient's chart, communicating with other professionals, and coordinating care. Greater than 50% of this time was spent in counseling or coordinating care with the patient regarding goals of hospitalization, psycho-education, and discharge planning needs.  Past Psychiatric History: see admission H&P  Past Medical History:  Past Medical History:  Diagnosis Date  . Anxiety   . Bipolar 1 disorder (HCC)   . Chronic fatigue   . Chronic pain   . Complete intestinal obstruction (HCC)   . Depression    Phreesia 12/03/2020  . Fecal peritonitis (HCC) 08/03/2019  . Fibromyalgia   . Hyperlipidemia    Phreesia 12/03/2020  . Ileus, postoperative (HCC)   . Perforated rectum (HCC) 08/03/2019    Past Surgical History:  Procedure Laterality Date  . COLON RESECTION SIGMOID  08/03/2019   Procedure: COLON RESECTION SIGMOID;  Surgeon: Lucretia Roers, MD;  Location: [facility] ORS;  Service: General;;  . COLOSTOMY N/A 08/03/2019   Procedure: COLOSTOMY;  Surgeon: Lucretia Roers, MD;  Location: [facility] ORS;  Service: General;  Laterality: N/A;  . FLEXIBLE SIGMOIDOSCOPY N/A 08/03/2019   Procedure: FLEXIBLE SIGMOIDOSCOPY;  Surgeon: Corbin Ade, MD;  Location: [facility] ENDO SUITE;  Service: Endoscopy;  Laterality: N/A;  . KNEE ARTHROSCOPY Left 2006   miniscus tear  . LAPAROTOMY N/A 08/03/2019   Procedure: EXPLORATORY LAPAROTOMY;  Surgeon: Lucretia Roers, MD;  Location: [facility] ORS;  Service: General;  Laterality: N/A;  . LYMPH GLAND EXCISION Left 1983  . SMALL INTESTINE SURGERY  N/A    Phreesia 12/03/2020  . TONSILLECTOMY     Family History:  Family History  Problem Relation Age of Onset  . Diabetes Mother   . Heart attack Father   . Diabetes Maternal Grandmother   . Colon cancer Neg Hx   . Colon polyps Neg Hx    Family Psychiatric  History: see admission H&P  Social History:  Social History   Substance  and Sexual Activity  Alcohol Use Not Currently   Comment: occasional; denied 10/02/19     Social History   Substance and Sexual Activity  Drug Use Not Currently  . Types: Cocaine   Comment: last used in December 2020.     Social History   Socioeconomic History  . Marital status: Legally Separated    Spouse name: Not on file  . Number of children: Not on file  . Years of education: Not on file  . Highest education level: Not on file  Occupational History  . Not on file  Tobacco Use  . Smoking status: Current Every Day Smoker    Packs/day: 1.00    Years: 10.00    Pack years: 10.00  . Smokeless tobacco: Never Used  Vaping Use  . Vaping Use: Some days  Substance and Sexual Activity  . Alcohol use: Not Currently    Comment: occasional; denied 10/02/19  . Drug use: Not Currently    Types: Cocaine    Comment: last used in December 2020.   Marland Kitchen Sexual activity: Not Currently  Other Topics Concern  . Not on file  Social History Narrative   Pt lives alone and is on disability. Counts his mother, sister and aunt as his social supports.    Social Determinants of Health   Financial Resource Strain: Not on file  Food Insecurity: Not on file  Transportation Needs: Not on file  Physical Activity: Not on file  Stress: Not on file  Social Connections: Not on file   Sleep: Good  Appetite:  Good  Current Medications: Current Facility-Administered Medications  Medication Dose Route Frequency Provider Last Rate Last Admin  . acetaminophen (TYLENOL) tablet 650 mg  650 mg Oral Q6H PRN Nwoko, Uchenna E, PA   650 mg at 04/07/21 2108  . alum & mag hydroxide-simeth (MAALOX/MYLANTA) 200-200-20 MG/5ML suspension 30 mL  30 mL Oral Q4H PRN Nwoko, Uchenna E, PA      . carbamazepine (TEGRETOL) chewable tablet 200 mg  200 mg Oral BID Mason Jim, Nayan Proch E, MD   200 mg at 04/10/21 0840  . clonazePAM (KLONOPIN) tablet 0.5 mg  0.5 mg Oral BID Bartholomew Crews E, MD   0.5 mg at 04/10/21 0840  .  cyclobenzaprine (FLEXERIL) tablet 10 mg  10 mg Oral BID PRN Nwoko, Uchenna E, PA   10 mg at 04/07/21 2108  . gabapentin (NEURONTIN) capsule 400 mg  400 mg Oral QID Nwoko, Uchenna E, PA   400 mg at 04/10/21 1129  . hydrOXYzine (ATARAX/VISTARIL) tablet 25 mg  25 mg Oral TID PRN Nwoko, Uchenna E, PA   25 mg at 04/09/21 2130  . magnesium hydroxide (MILK OF MAGNESIA) suspension 30 mL  30 mL Oral Daily PRN Nwoko, Uchenna E, PA      . meloxicam (MOBIC) tablet 7.5 mg  7.5 mg Oral BID Mason Jim, Jacquel Redditt E, MD   7.5 mg at 04/10/21 0840  . nicotine (NICODERM CQ - dosed in mg/24 hours) patch 21 mg  21 mg Transdermal Daily Nwoko, Tommas Olp, PA  21 mg at 04/10/21 0841  . ondansetron (ZOFRAN) tablet 4 mg  4 mg Oral Q8H PRN Nwoko, Uchenna E, PA   4 mg at 04/09/21 2130  . risperiDONE (RISPERDAL M-TABS) disintegrating tablet 2 mg  2 mg Oral BID Nickless Locket, MD   2 mg at 04/10/21 0843  . traZODone (DESYREL) tablet 100 mg  100 mg Oral QHS Ainley Locket, MD   100 mg at 04/09/21 2130    Lab Results:  Results for orders placed or performed during the hospital encounter of 04/07/21 (from the past 48 hour(s))  Lipid panel     Status: Abnormal   Collection Time: 04/09/21  6:38 AM  Result Value Ref Range   Cholesterol 219 (H) 0 - 200 mg/dL   Triglycerides 341 (H) <150 mg/dL   HDL 52 >93 mg/dL   Total CHOL/HDL Ratio 4.2 RATIO   VLDL 32 0 - 40 mg/dL   LDL Cholesterol 790 (H) 0 - 99 mg/dL    Comment:        Total Cholesterol/HDL:CHD Risk Coronary Heart Disease Risk Table                     Men   Women  1/2 Average Risk   3.4   3.3  Average Risk       5.0   4.4  2 X Average Risk   9.6   7.1  3 X Average Risk  23.4   11.0        Use the calculated Patient Ratio above and the CHD Risk Table to determine the patient's CHD Risk.        ATP III CLASSIFICATION (LDL):  <100     mg/dL   Optimal  240-973  mg/dL   Near or Above                    Optimal  130-159  mg/dL   Borderline  532-992  mg/dL   High   >426     mg/dL   Very High Performed at [facility], [address], [place], [place] [address]     Blood Alcohol level:  Lab Results  Component Value Date   ETH <10 04/06/2021   ETH <10 07/17/2020    Metabolic Disorder Labs: Lab Results  Component Value Date   HGBA1C 5.4 04/07/2021   MPG 108.28 04/07/2021   No results found for: PROLACTIN Lab Results  Component Value Date   CHOL 219 (H) 04/09/2021   TRIG 159 (H) 04/09/2021   HDL 52 04/09/2021   CHOLHDL 4.2 04/09/2021   VLDL 32 04/09/2021   LDLCALC 135 (H) 04/09/2021   LDLCALC 103 (H) 04/07/2021    Physical Findings: AIMS: Facial and Oral Movements Muscles of Facial Expression: None, normal Lips and Perioral Area: None, normal Jaw: None, normal Tongue: None, normal,Extremity Movements Upper (arms, wrists, hands, fingers): None, normal Lower (legs, knees, ankles, toes): None, normal, Trunk Movements Neck, shoulders, hips: None, normal, Overall Severity Severity of abnormal movements (highest score from questions above): None, normal Incapacitation due to abnormal movements: None, normal Patient's awareness of abnormal movements (rate only patient's report): No Awareness, Dental Status Current problems with teeth and/or dentures?: No Does patient usually wear dentures?: No      Musculoskeletal: Strength & Muscle Tone: within normal limits Gait & Station: steady, normal Patient leans: N/A  Psychiatric Specialty Exam: Physical Exam Vitals reviewed.  HENT:     Head: Normocephalic.  Pulmonary:     Effort: Pulmonary effort is normal.  Neurological:     General: No focal deficit present.     Mental Status: He is alert.     Review of Systems  Respiratory: Negative for shortness of breath.   Cardiovascular: Negative for chest pain.  Gastrointestinal: Negative for constipation, diarrhea, nausea and vomiting.    Blood pressure (!) 143/90, pulse 94, temperature 97.8 F (36.6 C), temperature  source Oral, resp. rate 16, height 5\' 11"  (1.803 m), weight 91.2 kg, SpO2 98 %.Body mass index is 28.03 kg/m.  General Appearance: disheveled, hair greasy and uncombed, wearing scrubs  Eye Contact:  Good  Speech:  Less pressured, clear and coherent  Volume:  Normal  Mood:  Described as "good" - appears mildly anxious  Affect: mildly anxious but pleasant   Thought Process:  Linear with direct questions but tangential at times  Orientation:  Oriented to month, date, year, city  Thought Content:  Continued grandiose delusional statements on exam; no gross response to internal/external stimuli on exam; less paranoid appearing; denies ideas of reference or first rank symptoms and denies AVH  Suicidal Thoughts:  No  Homicidal Thoughts:  No  Memory:  Recent;   Poor  Judgement:  Impaired  Insight:  Lacking  Psychomotor Activity: Normal   Concentration: Improving  Recall:  Poor  Fund of Knowledge:  Fair  Language:  Fair  Akathisia:  Negative  Assets:  Communication Skills Desire for Improvement Resilience Social Support  ADL's:  independent  Cognition:  Impaired,  Mild secondary to psychosis/mania  Sleep:  Number of Hours: 8.25   Treatment Plan Summary: Diagnoses / Active Problems: Bipolar I MRE manic severe with psychotic features H/o perforated rectum s/p partial colectomy and colostomy  PLAN: 1. Safety and Monitoring:             -- Involuntary admission to inpatient psychiatric unit for safety, stabilization and treatment - 2nd opinion completed             -- Daily contact with patient to assess and evaluate symptoms and progress in treatment             -- Patient's case to be discussed in multi-disciplinary team meeting             -- Observation Level : q15 minute checks             -- Vital signs:  q12 hours             -- Precautions: suicide  2. Psychiatric Diagnoses and Treatment:  Bipolar I MRE manic severe with psychotic features -- Continue Tegretol 200mg  bid  (will check Tegretol level, LFTs, and CBC on Monday; Admission WBC 10.3, ALT 21, AST 22)             -- Increase Risperdal M tabs 2mg  qam and 3mg  po qhs for residual delusions/mania             -- Cogentin 0.5mg  bid PRN EPS/Tremor             -- Continue home dose of Neurontin 400mg  qid to help with pain and mood stabilization             -- Continue Klonopin 0.5mg  bid for antimanic properties of medication with goal of weaning off prior to discharge             -- Continue Trazodone 100mg  qhs for sleep             --  Has PRN Vistaril 25mg  tid for anxiety             -- Agitation protocol - Ativan 1mg  po or IM q6 hours PRN and Haldol 5mg  po or IM q6 hours PRN             -- Metabolic profile and EKG monitoring obtained while on an atypical antipsychotic (BMI: 28.03; Lipid Panel:cholesterol 212, triglycerides 307, HDL 48, LDL 103; HbgA1c:5.4; QTc:44012ms) Repeating EKG for QTc monitoring with antipsychotic dose changes             -- Encouraged patient to participate in unit milieu and in scheduled group therapies              -- Short Term Goals: Ability to identify changes in lifestyle to reduce recurrence of condition will improve, Ability to demonstrate self-control will improve and Compliance with prescribed medications will improve             -- Long Term Goals: Improvement in symptoms so as ready for discharge              3. Medical Issues Being Addressed:              Tobacco Use Disorder             -- Nicotine patch 21mg /24 hours ordered             -- Smoking cessation encouraged              H/o perforated rectum s/p partial colectomy and colostomy             -- Ordered colostomy supplies and nursing to assist as needed              Chronic pain syndrome             -- Holding Cymbalta in the event is contributing to mania             -- Continue Neurontin 400mg  qid             -- Flexeril 10mg  bid PRN spasm             -- Continue Mobic 7.5mg  bid with  food   Hyperlipidemia/Hypertriglyceridemia  -- Repeat fasting labs 04/09/21: Cholesterol 219, triglycerides 159, HDL 52, LDL 135  -- Will encourage dietary changes, increased exercise and repeat lab monitoring with PCP as an outpatient   Based on Collateral from Mother obtained by SW, there is concern for possible recent TBI  -- Cancelled Head CT since he already had CT scan 12/21 which showed normal appearance of the brain with non-acute right nasal arch and medial left orbit fractures which have occurred since 2015  -- Would benefit from Neurology f/u after discharge   4. Discharge Planning:              -- Social work and case management to assist with discharge planning and identification of hospital follow-up needs prior to discharge             -- Estimated LOS: 5-7 days             -- Discharge Concerns: Need to establish a safety plan; Medication compliance and effectiveness             -- Discharge Goals: Return home with outpatient referrals for mental health follow-up including medication management/psychotherapy  I certify that inpatient services furnished can reasonably be expected to improve the patient's condition.  Dahlen Locket, MD, FAPA 04/10/2021, 2:01 PM

## 2021-04-11 DIAGNOSIS — F312 Bipolar disorder, current episode manic severe with psychotic features: Secondary | ICD-10-CM | POA: Diagnosis not present

## 2021-04-11 MED ORDER — RISPERIDONE 3 MG PO TBDP
3.0000 mg | ORAL_TABLET | Freq: Every day | ORAL | Status: DC
  Administered 2021-04-11 – 2021-04-14 (×4): 3 mg via ORAL
  Filled 2021-04-11 (×6): qty 1

## 2021-04-11 MED ORDER — CLONAZEPAM 0.25 MG PO TBDP
0.2500 mg | ORAL_TABLET | Freq: Two times a day (BID) | ORAL | Status: DC
  Administered 2021-04-11 – 2021-04-13 (×4): 0.25 mg via ORAL
  Filled 2021-04-11 (×4): qty 2

## 2021-04-11 NOTE — Progress Notes (Signed)
[facility] MD Progress Note  04/11/2021 7:49 AM [name] [name]  MRN:  [id number]   Chief complaint: mania with psychosis  Subjective:  [name] [name] is a 55 y.o. male with a history of bipolar I d/o, who was initially admitted for inpatient psychiatric hospitalization on 04/07/2021 for management of acute mania with psychosis. The patient is currently on Hospital Day 4.   Chart Review from last 24 hours:  The patient's chart was reviewed and nursing notes were reviewed. The patient's case was discussed in multidisciplinary team meeting. Per nursing, he slept 8.25 hours. He reportedly was tangential and had ongoing delusions yesterday and did not attend groups. Per MAR, he was compliant with scheduled medications and did receive Vistaril X1 for anxiety and Zofran X1 for nausea.  Information Obtained Today During Patient Interview: The patient was seen and evaluated on the unit. The patient reports "feeling good" today when questioned about his mood. He reports that he showered yesterday but did not comb his hair when questioned about his hygiene. He reports good sleep and appetite and voices no physical complaints. He denies side-effects with his medications. He denies AVH, ideas of reference, or first rank symptoms. 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  Principal Problem: Bipolar I disorder, current or most recent episode manic, with psychotic features (HCC) Diagnosis: Principal Problem:   Bipolar I disorder, current or most recent episode manic, with psychotic features (HCC) Active Problems:   Colostomy in place [facility])   Chronic pain syndrome   Tobacco abuse  Total Time Spent in Direct Patient Care:  I personally spent 30 minutes on the unit in direct patient care. The direct patient care time included face-to-face time  with the patient, reviewing the patient's chart, communicating with other professionals, and coordinating care. Greater than 50% of this time was spent in counseling or coordinating care with the patient regarding goals of hospitalization, psycho-education, and discharge planning needs.  Past Psychiatric History: see admission H&P  Past Medical History:  Past Medical History:  Diagnosis Date  . Anxiety   . Bipolar 1 disorder (HCC)   . Chronic fatigue   . Chronic pain   . Complete intestinal obstruction (HCC)   . Depression    Phreesia 12/03/2020  . Fecal peritonitis (HCC) 08/03/2019  . Fibromyalgia   . Hyperlipidemia    Phreesia 12/03/2020  . Ileus, postoperative (HCC)   . Perforated rectum (HCC) 08/03/2019    Past Surgical History:  Procedure Laterality Date  . COLON RESECTION SIGMOID  08/03/2019   Procedure: COLON RESECTION SIGMOID;  Surgeon: Lucretia Roers, MD;  Location: [facility];  Service: General;;  . COLOSTOMY N/A 08/03/2019   Procedure: COLOSTOMY;  Surgeon: Lucretia Roers, MD;  Location: [facility];  Service: General;  Laterality: N/A;  . FLEXIBLE SIGMOIDOSCOPY N/A 08/03/2019   Procedure: FLEXIBLE SIGMOIDOSCOPY;  Surgeon: Corbin Ade, MD;  Location: [facility];  Service: Endoscopy;  Laterality: N/A;  . KNEE ARTHROSCOPY Left 2006   miniscus tear  . LAPAROTOMY N/A 08/03/2019   Procedure: EXPLORATORY LAPAROTOMY;  Surgeon: Lucretia Roers, MD;  Location: [facility];  Service: General;  Laterality: N/A;  . LYMPH GLAND EXCISION Left 1983  . SMALL INTESTINE SURGERY N/A    Phreesia 12/03/2020  . TONSILLECTOMY     Family History:  Family History  Problem Relation Age of Onset  . Diabetes Mother   . Heart attack Father   . Diabetes Maternal Grandmother   . Colon cancer Neg Hx   . Colon polyps Neg Hx    Family Psychiatric  History: see admission H&P  Social History:  Social History   Substance and Sexual Activity  Alcohol Use Not Currently   Comment: occasional; denied  10/02/19     Social History   Substance and Sexual Activity  Drug Use Not Currently  . Types: Cocaine   Comment: last used in December 2020.     Social History   Socioeconomic History  . Marital status: Legally Separated    Spouse name: Not on file  . Number of children: Not on file  . Years of education: Not on file  . Highest education level: Not on file  Occupational History  . Not on file  Tobacco Use  . Smoking status: Current Every Day Smoker    Packs/day: 1.00    Years: 10.00    Pack years: 10.00  . Smokeless tobacco: Never Used  Vaping Use  . Vaping Use: Some days  Substance and Sexual Activity  . Alcohol use: Not Currently    Comment: occasional; denied 10/02/19  . Drug use: Not Currently    Types: Cocaine    Comment: last used in December 2020.   Marland Kitchen Sexual activity: Not Currently  Other Topics Concern  . Not on file  Social History Narrative   Pt lives alone and is on disability. Counts his mother, sister and aunt as his social supports.    Social Determinants of Health   Financial Resource Strain: Not on file  Food Insecurity: Not on file  Transportation Needs: Not on file  Physical Activity: Not on file  Stress: Not on file  Social Connections: Not on file   Sleep: Good  Appetite:  Good  Current Medications: Current Facility-Administered Medications  Medication Dose Route Frequency Provider Last Rate Last Admin  . acetaminophen (TYLENOL) tablet 650 mg  650 mg Oral Q6H PRN Nwoko, Uchenna E, PA   650 mg at 04/07/21 2108  . alum & mag hydroxide-simeth (MAALOX/MYLANTA) 200-200-20 MG/5ML suspension 30 mL  30 mL Oral Q4H PRN Nwoko, Uchenna E, PA      . carbamazepine (TEGRETOL) chewable tablet 200 mg  200 mg Oral BID Mason Jim, Zaul Hubers E, MD   200 mg at 04/10/21 2103  . clonazePAM (KLONOPIN) tablet 0.5 mg  0.5 mg Oral BID Mason Jim, Christinna Sprung E, MD   0.5 mg at 04/10/21 1635  . cyclobenzaprine (FLEXERIL) tablet 10 mg  10 mg Oral BID PRN Nwoko, Uchenna E, PA   10 mg  at 04/07/21 2108  . gabapentin (NEURONTIN) capsule 400 mg  400 mg Oral QID Nwoko, Uchenna E, PA   400 mg at 04/10/21 2103  . hydrOXYzine (ATARAX/VISTARIL) tablet 25 mg  25 mg Oral TID PRN Nwoko, Uchenna E, PA   25 mg at 04/10/21 2103  . magnesium hydroxide (MILK OF MAGNESIA) suspension 30 mL  30 mL Oral Daily PRN Nwoko, Uchenna E, PA      . meloxicam (MOBIC) tablet 7.5 mg  7.5 mg Oral BID Mason Jim, Gabbriella Presswood E, MD   7.5 mg at 04/10/21 1635  . nicotine (NICODERM CQ - dosed in mg/24 hours) patch 21 mg  21 mg Transdermal Daily Nwoko, Uchenna E, PA   21 mg at 04/10/21 0841  . ondansetron (ZOFRAN) tablet 4 mg  4 mg Oral Q8H PRN Nwoko,  Uchenna E, PA   4 mg at 04/10/21 1815  . risperiDONE (RISPERDAL M-TABS) disintegrating tablet 2 mg  2 mg Oral Daily Isobella Ascher E, MD      . risperiDONE (RISPERDAL M-TABS) disintegrating tablet 3 mg  3 mg Oral QHS Howerton Locket, MD   3 mg at 04/10/21 2103  . traZODone (DESYREL) tablet 100 mg  100 mg Oral QHS Bartholomew Crews E, MD   100 mg at 04/10/21 2103    Lab Results:  No results found for this or any previous visit (from the past 48 hour(s)).  Blood Alcohol level:  Lab Results  Component Value Date   ETH <10 04/06/2021   ETH <10 07/17/2020    Metabolic Disorder Labs: Lab Results  Component Value Date   HGBA1C 5.4 04/07/2021   MPG 108.28 04/07/2021   No results found for: PROLACTIN Lab Results  Component Value Date   CHOL 219 (H) 04/09/2021   TRIG 159 (H) 04/09/2021   HDL 52 04/09/2021   CHOLHDL 4.2 04/09/2021   VLDL 32 04/09/2021   LDLCALC 135 (H) 04/09/2021   LDLCALC 103 (H) 04/07/2021    Physical Findings: AIMS: Facial and Oral Movements Muscles of Facial Expression: None, normal Lips and Perioral Area: None, normal Jaw: None, normal Tongue: None, normal,Extremity Movements Upper (arms, wrists, hands, fingers): None, normal Lower (legs, knees, ankles, toes): None, normal, Trunk Movements Neck, shoulders, hips: None, normal, Overall  Severity Severity of abnormal movements (highest score from questions above): None, normal Incapacitation due to abnormal movements: None, normal Patient's awareness of abnormal movements (rate only patient's report): No Awareness, Dental Status Current problems with teeth and/or dentures?: No Does patient usually wear dentures?: No      Musculoskeletal: Strength & Muscle Tone: within normal limits Gait & Station: steady, normal Patient leans: N/A  Psychiatric Specialty Exam: Physical Exam Vitals reviewed.  HENT:     Head: Normocephalic.  Pulmonary:     Effort: Pulmonary effort is normal.  Neurological:     General: No focal deficit present.     Mental Status: He is alert.     Review of Systems  Respiratory: Negative for shortness of breath.   Cardiovascular: Negative for chest pain.  Gastrointestinal: Negative for constipation, diarrhea, nausea and vomiting.    Blood pressure (!) 131/93, pulse 91, temperature 98 F (36.7 C), temperature source Oral, resp. rate 20, height  (1.803 m), weight 91.2 kg, SpO2 99 %.Body mass index is 28.03 kg/m.  General Appearance: poor hygiene- wearing scrubs  Eye Contact:  Good  Speech:  Less pressured, clear and coherent  Volume:  Normal  Mood:  Described as "good"   Affect: mildly anxious and suspicious  Thought Process:  tangential  Orientation:  Oriented to month, year, city  Thought Content:  Continued grandiose delusional statements on exam; no gross response to internal/external stimuli on exam; less paranoid appearing; denies ideas of reference or first rank symptoms and denies AVH  Suicidal Thoughts:  No  Homicidal Thoughts:  No  Memory:  Recent;   Poor  Judgement:  Impaired  Insight:  Lacking  Psychomotor Activity: Normal - no cogwheeling, stiffness, or tremor; AIMS 0  Concentration: Improving  Recall:  Poor  Fund of Knowledge:  Fair  Language:  Fair  Akathisia:  Negative  Assets:  Communication Skills Desire for  Improvement Resilience Social Support  ADL's:  independent  Cognition:  Impaired,  Mild secondary to psychosis/mania  Sleep:  Number of Hours: 8.25  Treatment Plan Summary: Diagnoses / Active Problems: Bipolar I MRE manic severe with psychotic features H/o perforated rectum s/p partial colectomy and colostomy  PLAN: 1. Safety and Monitoring:             -- Involuntary admission to inpatient psychiatric unit for safety, stabilization and treatment - 2nd opinion completed             -- Daily contact with patient to assess and evaluate symptoms and progress in treatment             -- Patient's case to be discussed in multi-disciplinary team meeting             -- Observation Level : q15 minute checks             -- Vital signs:  q12 hours             -- Precautions: suicide  2. Psychiatric Diagnoses and Treatment:  Bipolar I MRE manic severe with psychotic features -- Continue Tegretol 200mg  bid (will check Tegretol level, LFTs, and CBC on Monday; Admission WBC 10.3, ALT 21, AST 22)             -- Increase Risperdal M tabs 3mg  bid for residual delusions/mania             -- Cogentin 0.5mg  bid PRN EPS/Tremor             -- Continue home dose of Neurontin 400mg  qid to help with pain and mood stabilization             -- Reduce Klonopin to 0.25mg  bid for antimanic properties of medication with goal of weaning off prior to discharge             -- Continue Trazodone 100mg  qhs for sleep             -- Has PRN Vistaril 25mg  tid for anxiety             -- Agitation protocol - Ativan 1mg  po or IM q6 hours PRN and Haldol 5mg  po or IM q6 hours PRN             -- Metabolic profile and EKG monitoring obtained while on an atypical antipsychotic (BMI: 28.03; Lipid Panel:cholesterol 212, triglycerides 307, HDL 48, LDL 103; HbgA1c:5.4; QTc:447ms and on repeat )              -- Encouraged patient to participate in unit milieu and in scheduled group therapies              -- Short Term Goals:  Ability to identify changes in lifestyle to reduce recurrence of condition will improve, Ability to demonstrate self-control will improve and Compliance with prescribed medications will improve             -- Long Term Goals: Improvement in symptoms so as ready for discharge              3. Medical Issues Being Addressed:              Tobacco Use Disorder             -- Nicotine patch 21mg /24 hours ordered             -- Smoking cessation encouraged              H/o perforated rectum s/p partial colectomy and colostomy             --  Ordered colostomy supplies and nursing to assist as needed              Chronic pain syndrome             -- Holding Cymbalta in the event is contributing to mania             -- Continue Neurontin 400mg  qid             -- Flexeril 10mg  bid PRN spasm             -- Continue Mobic 7.5mg  bid with food   Hyperlipidemia/Hypertriglyceridemia  -- Repeat fasting labs 04/09/21: Cholesterol 219, triglycerides 159, HDL 52, LDL 135  -- Will encourage dietary changes, increased exercise and repeat lab monitoring with PCP as an outpatient   Based on Collateral from Mother obtained by SW, there is concern for possible recent TBI  -- Cancelled Head CT since he already had CT scan 12/21 which showed normal appearance of the brain with non-acute right nasal arch and medial left orbit fractures which have occurred since 2015  -- Would benefit from Neurology f/u after discharge   4. Discharge Planning:              -- Social work and case management to assist with discharge planning and identification of hospital follow-up needs prior to discharge             -- Estimated LOS: 5-7 days             -- Discharge Concerns: Need to establish a safety plan; Medication compliance and effectiveness             -- Discharge Goals: Return home with outpatient referrals for mental health follow-up including medication management/psychotherapy  I certify that inpatient services  furnished can reasonably be expected to improve the patient's condition.     Stachowski LocketAmy E Kord Monette, MD, FAPA 04/11/2021, 7:49 AM

## 2021-04-11 NOTE — Progress Notes (Signed)
Pt up to the nursing station endorsing chronic pain, pt stated he wanted to go lay down and requested his night medications. Pt given HS medications with PRN Vistaril per MAR.     04/11/21 2000  Psych Admission Type (Psych Patients Only)  Admission Status Involuntary  Psychosocial Assessment  Patient Complaints Other (Comment) (pain)  Eye Contact Fair  Facial Expression Animated;Anxious;Pensive  Affect Anxious;Preoccupied  Child psychotherapist Assertive  Motor Activity Slow  Appearance/Hygiene Poor hygiene;In scrubs  Behavior Characteristics Cooperative  Mood Suspicious  Thought Process  Coherency Disorganized;Tangential  Content Obsessions;Preoccupation;Delusions  Delusions Paranoid  Perception WDL  Hallucination None reported or observed  Judgment Poor  Confusion Mild  Danger to Self  Current suicidal ideation? Denies  Danger to Others  Danger to Others None reported or observed

## 2021-04-11 NOTE — Progress Notes (Signed)
   04/11/21 0500  Sleep  Number of Hours 8.25

## 2021-04-11 NOTE — Progress Notes (Signed)
   04/11/21 0909  Psych Admission Type (Psych Patients Only)  Admission Status Involuntary  Psychosocial Assessment  Patient Complaints Other (Comment) ("Just not feeling well")  Eye Contact Fair  Facial Expression Animated;Anxious;Pensive  Affect Anxious;Preoccupied  Child psychotherapist Assertive  Motor Activity Slow  Appearance/Hygiene Poor hygiene;In scrubs  Behavior Characteristics Cooperative  Mood Suspicious  Thought Process  Coherency Disorganized;Tangential  Content Obsessions;Preoccupation;Delusions  Delusions Paranoid  Perception WDL  Hallucination None reported or observed  Judgment Poor  Confusion Mild  Danger to Self  Current suicidal ideation? Denies  Danger to Others  Danger to Others None reported or observed  [name] [name] has been isolative to his room this morning.  He states he isn't feeling well.  He was unable to explain his symptoms.  He did get up for meals and to attend groups.  He denies any SI/HI or A/V hallucinations.  He remains delusional and grandiose but pleasant and cooperative.  He filled out his self inventory and reported his anxiety is 4/10, hopelessness 0/10 and depression 3/10 (10 the worst).  His goal for today is "rest and sleep ."  He will accomplish this goal by rest, sleep and get something for pain."  Q 15 minute checks maintained for safety.  We will monitor the progress towards his goals.

## 2021-04-11 NOTE — Plan of Care (Signed)
  Problem: Education: Goal: Mental status will improve Outcome: Progressing   Problem: Activity: Goal: Interest or engagement in activities will improve Outcome: Progressing   

## 2021-04-12 DIAGNOSIS — F312 Bipolar disorder, current episode manic severe with psychotic features: Secondary | ICD-10-CM | POA: Diagnosis not present

## 2021-04-12 NOTE — Progress Notes (Signed)
[facility] MD Progress Note  04/12/2021 7:47 AM [name] [name]  MRN:  [id number]   Chief complaint: mania with psychosis  Subjective:  [name] [name] is a 55 y.o. male with a history of bipolar I d/o, who was initially admitted for inpatient psychiatric hospitalization on 04/07/2021 for management of acute mania with psychosis. The patient is currently on Hospital Day 5.   Chart Review from last 24 hours:  The patient's chart was reviewed and nursing notes were reviewed. The patient's case was discussed in multidisciplinary team meeting. Per nursing, patient slept 7.5 hours. He reportedly remained delusional and grandiose but was pleasant and cooperative on the unit. Per [facility] he was compliant with scheduled medications and did receive Vistaril X1 for anxiety.  Information Obtained Today During Patient Interview: The patient was seen and evaluated on the unit. He questions when he will be discharged and was advised that we are still making medication adjustments. He admits he has not showered recently, and he was prompted to attend to his hygiene. Staff has gotten him toiletries and a set of clean clothes and are providing ostomy supplies. He states his mood is "good" and he reports good sleep and appetite. He voices no physical complaints. He denies medication side-effects. He denies AVH, paranoia, ideas of reference or first rank symptoms. He continues to make grandiose delusional statements including belief that he is doing "doppler research", has friends at [employer], and is working in Armed forces technical officer. He is oriented on exam.    Principal Problem: Bipolar I disorder, current or most recent episode manic, with psychotic features (HCC) Diagnosis: Principal Problem:   Bipolar I disorder, current or most recent episode manic, with psychotic features (HCC) Active Problems:   Colostomy in place [facility])   Chronic pain syndrome   Tobacco abuse  Total Time Spent in Direct Patient Care:  I personally spent 25  minutes on the unit in direct patient care. The direct patient care time included face-to-face time with the patient, reviewing the patient's chart, communicating with other professionals, and coordinating care. Greater than 50% of this time was spent in counseling or coordinating care with the patient regarding goals of hospitalization, psycho-education, and discharge planning needs.  Past Psychiatric History: see admission H&P  Past Medical History:  Past Medical History:  Diagnosis Date  . Anxiety   . Bipolar 1 disorder (HCC)   . Chronic fatigue   . Chronic pain   . Complete intestinal obstruction (HCC)   . Depression    Phreesia 12/03/2020  . Fecal peritonitis (HCC) 08/03/2019  . Fibromyalgia   . Hyperlipidemia    Phreesia 12/03/2020  . Ileus, postoperative (HCC)   . Perforated rectum (HCC) 08/03/2019    Past Surgical History:  Procedure Laterality Date  . COLON RESECTION SIGMOID  08/03/2019   Procedure: COLON RESECTION SIGMOID;  Surgeon: Lucretia Roers, MD;  Location: [facility] ORS;  Service: General;;  . COLOSTOMY N/A 08/03/2019   Procedure: COLOSTOMY;  Surgeon: Lucretia Roers, MD;  Location: [facility] ORS;  Service: General;  Laterality: N/A;  . FLEXIBLE SIGMOIDOSCOPY N/A 08/03/2019   Procedure: FLEXIBLE SIGMOIDOSCOPY;  Surgeon: Corbin Ade, MD;  Location: [facility] ENDO SUITE;  Service: Endoscopy;  Laterality: N/A;  . KNEE ARTHROSCOPY Left 2006   miniscus tear  . LAPAROTOMY N/A 08/03/2019   Procedure: EXPLORATORY LAPAROTOMY;  Surgeon: Lucretia Roers, MD;  Location: [facility] ORS;  Service: General;  Laterality: N/A;  . LYMPH GLAND EXCISION Left 1983  . SMALL INTESTINE SURGERY N/A  Phreesia 12/03/2020  . TONSILLECTOMY     Family History:  Family History  Problem Relation Age of Onset  . Diabetes Mother   . Heart attack Father   . Diabetes Maternal Grandmother   . Colon cancer Neg Hx   . Colon polyps Neg Hx    Family Psychiatric  History: see admission H&P  Social History:  Social  History   Substance and Sexual Activity  Alcohol Use Not Currently   Comment: occasional; denied 10/02/19     Social History   Substance and Sexual Activity  Drug Use Not Currently  . Types: Cocaine   Comment: last used in December 2020.     Social History   Socioeconomic History  . Marital status: Legally Separated    Spouse name: Not on file  . Number of children: Not on file  . Years of education: Not on file  . Highest education level: Not on file  Occupational History  . Not on file  Tobacco Use  . Smoking status: Current Every Day Smoker    Packs/day: 1.00    Years: 10.00    Pack years: 10.00  . Smokeless tobacco: Never Used  Vaping Use  . Vaping Use: Some days  Substance and Sexual Activity  . Alcohol use: Not Currently    Comment: occasional; denied 10/02/19  . Drug use: Not Currently    Types: Cocaine    Comment: last used in December 2020.   Marland Kitchen Sexual activity: Not Currently  Other Topics Concern  . Not on file  Social History Narrative   Pt lives alone and is on disability. Counts his mother, sister and aunt as his social supports.    Social Determinants of Health   Financial Resource Strain: Not on file  Food Insecurity: Not on file  Transportation Needs: Not on file  Physical Activity: Not on file  Stress: Not on file  Social Connections: Not on file   Sleep: Good  Appetite:  Good  Current Medications: Current Facility-Administered Medications  Medication Dose Route Frequency Provider Last Rate Last Admin  . acetaminophen (TYLENOL) tablet 650 mg  650 mg Oral Q6H PRN Nwoko, Uchenna E, PA   650 mg at 04/07/21 2108  . alum & mag hydroxide-simeth (MAALOX/MYLANTA) 200-200-20 MG/5ML suspension 30 mL  30 mL Oral Q4H PRN Nwoko, Uchenna E, PA      . carbamazepine (TEGRETOL) chewable tablet 200 mg  200 mg Oral BID Mason Jim, Mirella Gueye E, MD   200 mg at 04/11/21 1954  . clonazePAM (KLONOPIN) disintegrating tablet 0.25 mg  0.25 mg Oral BID Mason Jim, Luanna Weesner E, MD    0.25 mg at 04/11/21 1715  . cyclobenzaprine (FLEXERIL) tablet 10 mg  10 mg Oral BID PRN Nwoko, Uchenna E, PA   10 mg at 04/07/21 2108  . gabapentin (NEURONTIN) capsule 400 mg  400 mg Oral QID Nwoko, Uchenna E, PA   400 mg at 04/11/21 1954  . hydrOXYzine (ATARAX/VISTARIL) tablet 25 mg  25 mg Oral TID PRN Nwoko, Uchenna E, PA   25 mg at 04/11/21 1957  . magnesium hydroxide (MILK OF MAGNESIA) suspension 30 mL  30 mL Oral Daily PRN Nwoko, Uchenna E, PA      . meloxicam (MOBIC) tablet 7.5 mg  7.5 mg Oral BID Mason Jim, Kiyan Burmester E, MD   7.5 mg at 04/11/21 1715  . nicotine (NICODERM CQ - dosed in mg/24 hours) patch 21 mg  21 mg Transdermal Daily Nwoko, Uchenna E, PA   21 mg at  04/11/21 0911  . ondansetron (ZOFRAN) tablet 4 mg  4 mg Oral Q8H PRN Nwoko, Uchenna E, PA   4 mg at 04/10/21 1815  . risperiDONE (RISPERDAL M-TABS) disintegrating tablet 3 mg  3 mg Oral QHS Pies Locket, MD   3 mg at 04/11/21 1954  . risperiDONE (RISPERDAL M-TABS) disintegrating tablet 3 mg  3 mg Oral Daily Bartholomew Crews E, MD   3 mg at 04/11/21 0908  . traZODone (DESYREL) tablet 100 mg  100 mg Oral QHS Mason Jim, Daishaun Ayre E, MD   100 mg at 04/11/21 1954    Lab Results:  No results found for this or any previous visit (from the past 48 hour(s)).  Blood Alcohol level:  Lab Results  Component Value Date   ETH <10 04/06/2021   ETH <10 07/17/2020    Metabolic Disorder Labs: Lab Results  Component Value Date   HGBA1C 5.4 04/07/2021   MPG 108.28 04/07/2021   No results found for: PROLACTIN Lab Results  Component Value Date   CHOL 219 (H) 04/09/2021   TRIG 159 (H) 04/09/2021   HDL 52 04/09/2021   CHOLHDL 4.2 04/09/2021   VLDL 32 04/09/2021   LDLCALC 135 (H) 04/09/2021   LDLCALC 103 (H) 04/07/2021    Physical Findings: AIMS: Facial and Oral Movements Muscles of Facial Expression: None, normal Lips and Perioral Area: None, normal Jaw: None, normal Tongue: None, normal,Extremity Movements Upper (arms, wrists, hands,  fingers): None, normal Lower (legs, knees, ankles, toes): None, normal, Trunk Movements Neck, shoulders, hips: None, normal, Overall Severity Severity of abnormal movements (highest score from questions above): None, normal Incapacitation due to abnormal movements: None, normal Patient's awareness of abnormal movements (rate only patient's report): No Awareness, Dental Status Current problems with teeth and/or dentures?: No Does patient usually wear dentures?: No      Musculoskeletal: Strength & Muscle Tone: within normal limits Gait & Station: steady, normal Patient leans: N/A  Psychiatric Specialty Exam: Physical Exam Vitals reviewed.  HENT:     Head: Normocephalic.  Pulmonary:     Effort: Pulmonary effort is normal.  Neurological:     General: No focal deficit present.     Mental Status: He is alert.     Review of Systems  Respiratory: Negative for shortness of breath.   Cardiovascular: Negative for chest pain.  Gastrointestinal: Negative for constipation, diarrhea, nausea and vomiting.    Blood pressure (!) 131/93, pulse 91, temperature 98 F (36.7 C), temperature source Oral, resp. rate 20, height  (1.803 m), weight 91.2 kg, SpO2 99 %.Body mass index is 28.03 kg/m.  General Appearance: poor hygiene- wearing scrubs and appears unkempt  Eye Contact:  Good  Speech:  Less pressured, clear and coherent but rambling   Volume:  Normal  Mood:  Described as "good" - appears mildly elevated  Affect: mildly anxious and grandiose  Thought Process:  tangential  Orientation:  Oriented to month, year, city  Thought Content:  Continued grandiose delusional statements on exam; no gross response to internal/external stimuli on exam; less paranoid appearing; denies ideas of reference or first rank symptoms and denies AVH  Suicidal Thoughts:  No  Homicidal Thoughts:  No  Memory:  Recent;   Poor  Judgement:  Impaired  Insight:  Lacking  Psychomotor Activity: Normal    Concentration: Improving  Recall:  Poor  Fund of Knowledge:  Fair  Language:  Fair  Akathisia:  Negative  Assets:  Communication Skills Desire for Improvement Resilience Social Support  ADL's:  independent  Cognition:  Impaired,  Mild secondary to psychosis/mania  Sleep:  Number of Hours: 7.5   Treatment Plan Summary: Diagnoses / Active Problems: Bipolar I MRE manic severe with psychotic features H/o perforated rectum s/p partial colectomy and colostomy  PLAN: 1. Safety and Monitoring:             -- Involuntary admission to inpatient psychiatric unit for safety, stabilization and treatment - 2nd opinion completed             -- Daily contact with patient to assess and evaluate symptoms and progress in treatment             -- Patient's case to be discussed in multi-disciplinary team meeting             -- Observation Level : q15 minute checks             -- Vital signs:  q12 hours             -- Precautions: suicide  2. Psychiatric Diagnoses and Treatment:  Bipolar I MRE manic severe with psychotic features -- Continue Tegretol 200mg  bid (will check Tegretol level, LFTs, and CBC on Monday; Admission WBC 10.3, ALT 21, AST 22) May be able to titrate up on Tegretol pending labs             -- Continue Risperdal M tabs 3mg  bid for residual delusions/mania - monitoring for symptom improvement with recent dose change             -- Cogentin 0.5mg  bid PRN EPS/Tremor             -- Continue home dose of Neurontin 400mg  qid to help with pain and mood stabilization             -- Continue Klonopin 0.25mg  bid for antimanic properties of medication with goal of weaning off prior to discharge             -- Continue Trazodone 100mg  qhs for sleep             -- Has PRN Vistaril 25mg  tid for anxiety             -- Agitation protocol - Ativan 1mg  po or IM q6 hours PRN and Haldol 5mg  po or IM q6 hours PRN             -- Metabolic profile and EKG monitoring obtained while on an atypical  antipsychotic (BMI: 28.03; Lipid Panel:cholesterol 212, triglycerides 307, HDL 48, LDL 103; HbgA1c:5.4; QTc:47512ms and on repeat 425ms)              -- Encouraged patient to participate in unit milieu and in scheduled group therapies              -- Short Term Goals: Ability to identify changes in lifestyle to reduce recurrence of condition will improve, Ability to demonstrate self-control will improve and Compliance with prescribed medications will improve             -- Long Term Goals: Improvement in symptoms so as ready for discharge              3. Medical Issues Being Addressed:              Tobacco Use Disorder             -- Nicotine patch 21mg /24 hours ordered             --  Smoking cessation encouraged              H/o perforated rectum s/p partial colectomy and colostomy             -- Ordered colostomy supplies and nursing to assist as needed              Chronic pain syndrome             -- Holding Cymbalta in the event is contributing to mania             -- Continue Neurontin 400mg  qid             -- Flexeril 10mg  bid PRN spasm             -- Continue Mobic 7.5mg  bid with food   Hyperlipidemia/Hypertriglyceridemia  -- Repeat fasting labs 04/09/21: Cholesterol 219, triglycerides 159, HDL 52, LDL 135  -- Will encourage dietary changes, increased exercise and repeat lab monitoring with PCP as an outpatient   Based on Collateral from Mother obtained by SW, there is concern for possible recent TBI  -- Cancelled Head CT since he already had CT scan 12/21 which showed normal appearance of the brain with non-acute right nasal arch and medial left orbit fractures which have occurred since 2015  -- Would benefit from Neurology f/u after discharge   4. Discharge Planning:              -- Social work and case management to assist with discharge planning and identification of hospital follow-up needs prior to discharge             -- Estimated LOS: 5-7 days             -- Discharge  Concerns: Need to establish a safety plan; Medication compliance and effectiveness             -- Discharge Goals: Return home with outpatient referrals for mental health follow-up including medication management/psychotherapy  I certify that inpatient services furnished can reasonably be expected to improve the patient's condition.     1/22, MD, FAPA 04/12/2021, 7:47 AM

## 2021-04-12 NOTE — Progress Notes (Signed)
Adult Psychoeducational Group Note  Date:  04/12/2021 Time:  10:03 PM  Group Topic/Focus:  Wrap-Up Group:   The focus of this group is to help patients review their daily goal of treatment and discuss progress on daily workbooks.  Participation Level:  Did Not Attend  Participation Quality:  Did Not Attend  Affect:  Did Not Attend  Cognitive:  Did Not Attend  Insight: None  Engagement in Group:  Did Not Attend  Modes of Intervention:  Did Not Attend  Additional Comments:  Pt did not attend evening wrap up group tonight.  Felipa Furnace 04/12/2021, 10:03 PM

## 2021-04-12 NOTE — Progress Notes (Signed)
   04/12/21 2053  Psych Admission Type (Psych Patients Only)  Admission Status Involuntary  Psychosocial Assessment  Patient Complaints Isolation  Eye Contact Fair  Facial Expression Anxious  Affect Anxious;Preoccupied  Speech Tangential  Interaction Assertive  Motor Activity Slow  Appearance/Hygiene Improved  Behavior Characteristics Cooperative;Anxious  Mood Anxious  Thought Process  Coherency Disorganized;Tangential  Content Obsessions;Preoccupation;Delusions  Delusions Paranoid  Perception WDL  Hallucination None reported or observed  Judgment Poor  Confusion Mild  Danger to Self  Current suicidal ideation? Denies  Danger to Others  Danger to Others None reported or observed  Law was isolative to his room much of the evening.  He did come out for snack and hs medications.  He did complain of generalized pain and was given flexaril and tylenol with good relief.  He was pleasant and cooperative.  His main concern is about missing his court date and wants to talk with the SW tomorrow to get a letter explaining why he cannot attend.  He was tangential and continually talks about his stolen phone and how it can be tracked due to the cell phone towers.  He took his hs medications without difficulty.  He appears to be asleep at this time.  He denied SI/HI or A/V hallucinations.  Q 15 minute checks maintained for safety.  We will continue to monitor the progress towards his goals.

## 2021-04-12 NOTE — Progress Notes (Signed)
   04/12/21 0500  Sleep  Number of Hours 7.5

## 2021-04-12 NOTE — Progress Notes (Signed)
Pt has been calm and cooperative this shift.  Pt is concerned about upcoming court date and asks for a note to be sent on pt's behalf to the court why pt cannot be present for court hearing.  Pt denies SI/HI/AVH.  This Clinical research associate has not heard any delusional thinking so far this shift as was discussed in morning progression team meeting. RN administered meds, brought pt change of clothes and discussed colostomy items that pt needed.  Pt is safe on the unit with q 15 min room checks in place.  RN called mom who said she would bring ostomy supplies tomorrow 4/18.

## 2021-04-13 DIAGNOSIS — F312 Bipolar disorder, current episode manic severe with psychotic features: Secondary | ICD-10-CM | POA: Diagnosis not present

## 2021-04-13 LAB — CBC WITH DIFFERENTIAL/PLATELET
Abs Immature Granulocytes: 0.06 10*3/uL (ref 0.00–0.07)
Basophils Absolute: 0 10*3/uL (ref 0.0–0.1)
Basophils Relative: 0 %
Eosinophils Absolute: 0.4 10*3/uL (ref 0.0–0.5)
Eosinophils Relative: 5 %
HCT: 38.6 % — ABNORMAL LOW (ref 39.0–52.0)
Hemoglobin: 13.3 g/dL (ref 13.0–17.0)
Immature Granulocytes: 1 %
Lymphocytes Relative: 27 %
Lymphs Abs: 2.2 10*3/uL (ref 0.7–4.0)
MCH: 29.2 pg (ref 26.0–34.0)
MCHC: 34.5 g/dL (ref 30.0–36.0)
MCV: 84.8 fL (ref 80.0–100.0)
Monocytes Absolute: 0.7 10*3/uL (ref 0.1–1.0)
Monocytes Relative: 8 %
Neutro Abs: 4.9 10*3/uL (ref 1.7–7.7)
Neutrophils Relative %: 59 %
Platelets: 173 10*3/uL (ref 150–400)
RBC: 4.55 MIL/uL (ref 4.22–5.81)
RDW: 12.5 % (ref 11.5–15.5)
WBC: 8.3 10*3/uL (ref 4.0–10.5)
nRBC: 0 % (ref 0.0–0.2)

## 2021-04-13 LAB — CARBAMAZEPINE LEVEL, TOTAL: Carbamazepine Lvl: 8.1 ug/mL (ref 4.0–12.0)

## 2021-04-13 LAB — HEPATIC FUNCTION PANEL
ALT: 23 U/L (ref 0–44)
AST: 22 U/L (ref 15–41)
Albumin: 3.4 g/dL — ABNORMAL LOW (ref 3.5–5.0)
Alkaline Phosphatase: 55 U/L (ref 38–126)
Bilirubin, Direct: <0.1 mg/dL (ref 0.0–0.2)
Total Bilirubin: 0.5 mg/dL (ref 0.3–1.2)
Total Protein: 6 g/dL — ABNORMAL LOW (ref 6.5–8.1)

## 2021-04-13 MED ORDER — CLONAZEPAM 0.125 MG PO TBDP
0.2500 mg | ORAL_TABLET | Freq: Every day | ORAL | Status: DC
  Administered 2021-04-14: 0.25 mg via ORAL
  Filled 2021-04-13: qty 2

## 2021-04-13 MED ORDER — ENSURE ENLIVE PO LIQD
237.0000 mL | Freq: Two times a day (BID) | ORAL | Status: DC
  Administered 2021-04-13 – 2021-04-16 (×5): 237 mL via ORAL
  Filled 2021-04-13 (×11): qty 237

## 2021-04-13 NOTE — Progress Notes (Signed)
[facility]  04/13/2021 8:26 AM [name] MaxinMark D [name]  MRN:  [id number]   Chief complaint: mania with psychosis  Subjective:  [name] [name] is a 55 y.o. male with a history of bipolar I d/o, who was initially admitted for inpatient psychiatric hospitalization on 04/07/2021 for management of acute mania with psychosis. The patient is currently on Hospital Day 6.   Chart Review from last 24 hours:  The patient's chart was reviewed and nursing notes were reviewed. The patient's case was discussed in multidisciplinary team meeting. Per nursing, there were no behavioral issues or safety concerns noted. He remained delusional but cooperative. Per MAR, he was compliant with scheduled medications. He received Vistaril X1 for anxiety.   Information Obtained Today During Patient Interview: The patient was seen and evaluated on the unit. He appears to have changed clothes and has improved hygiene today. He states he is changing his colostomy and does not need assistance. He states his sleep has been good and his appetite has been great. He voices no physical complaints. He reports that his mood is "level" and he believes his thoughts are clearer. He denies AVH, paranoia, ideas of reference, first rank symptoms, or magical thinking. He does not make grandiose delusional statements and now denies belief that he is doing research, working for [employer], or working on Programme researcher, broadcasting/film/videoartifical intelligence. He is oriented. He denies medication side-effects.   Principal Problem: Bipolar I disorder, current or most recent episode manic, with psychotic features (HCC) Diagnosis: Principal Problem:   Bipolar I disorder, current or most recent episode manic, with psychotic features (HCC) Active Problems:   Colostomy in place [facility](HCC)   Chronic pain syndrome   Tobacco abuse  Total Time Spent in Direct Patient Care:  I personally spent 30 minutes on the unit in direct patient care. The direct patient care time included face-to-face time with  the patient, reviewing the patient's chart, communicating with other professionals, and coordinating care. Greater than 50% of this time was spent in counseling or coordinating care with the patient regarding goals of hospitalization, psycho-education, and discharge planning needs.  Past Psychiatric History: see admission H&P  Past Medical History:  Past Medical History:  Diagnosis Date  . Anxiety   . Bipolar 1 disorder (HCC)   . Chronic fatigue   . Chronic pain   . Complete intestinal obstruction (HCC)   . Depression    Phreesia 12/03/2020  . Fecal peritonitis (HCC) 08/03/2019  . Fibromyalgia   . Hyperlipidemia    Phreesia 12/03/2020  . Ileus, postoperative (HCC)   . Perforated rectum (HCC) 08/03/2019    Past Surgical History:  Procedure Laterality Date  . COLON RESECTION SIGMOID  08/03/2019   Procedure: COLON RESECTION SIGMOID;  Surgeon: Lucretia RoersBridges, Lindsay C, MD;  Location: [facility] ORS;  Service: General;;  . COLOSTOMY N/A 08/03/2019   Procedure: COLOSTOMY;  Surgeon: Lucretia RoersBridges, Lindsay C, MD;  Location: [facility] ORS;  Service: General;  Laterality: N/A;  . FLEXIBLE SIGMOIDOSCOPY N/A 08/03/2019   Procedure: FLEXIBLE SIGMOIDOSCOPY;  Surgeon: Corbin Adeourk, Robert M, MD;  Location: [facility] ENDO SUITE;  Service: Endoscopy;  Laterality: N/A;  . KNEE ARTHROSCOPY Left 2006   miniscus tear  . LAPAROTOMY N/A 08/03/2019   Procedure: EXPLORATORY LAPAROTOMY;  Surgeon: Lucretia RoersBridges, Lindsay C, MD;  Location: [facility];  Service: General;  Laterality: N/A;  . LYMPH GLAND EXCISION Left 1983  . SMALL INTESTINE SURGERY N/A    Phreesia 12/03/2020  . TONSILLECTOMY     Family History:  Family History  Problem Relation Age of Onset  . Diabetes Mother   . Heart attack Father   . Diabetes Maternal Grandmother   . Colon cancer Neg Hx   . Colon polyps Neg Hx    Family Psychiatric  History: see admission H&P  Social History:  Social History   Substance and Sexual Activity  Alcohol Use Not Currently   Comment: occasional; denied  10/02/19     Social History   Substance and Sexual Activity  Drug Use Not Currently  . Types: Cocaine   Comment: last used in December 2020.     Social History   Socioeconomic History  . Marital status: Legally Separated    Spouse name: Not on file  . Number of children: Not on file  . Years of education: Not on file  . Highest education level: Not on file  Occupational History  . Not on file  Tobacco Use  . Smoking status: Current Every Day Smoker    Packs/day: 1.00    Years: 10.00    Pack years: 10.00  . Smokeless tobacco: Never Used  Vaping Use  . Vaping Use: Some days  Substance and Sexual Activity  . Alcohol use: Not Currently    Comment: occasional; denied 10/02/19  . Drug use: Not Currently    Types: Cocaine    Comment: last used in December 2020.   Marland Kitchen Sexual activity: Not Currently  Other Topics Concern  . Not on file  Social History Narrative   Pt lives alone and is on disability. Counts his mother, sister and aunt as his social supports.    Social Determinants of Health   Financial Resource Strain: Not on file  Food Insecurity: Not on file  Transportation Needs: Not on file  Physical Activity: Not on file  Stress: Not on file  Social Connections: Not on file   Sleep: Good  Appetite:  Good  Current Medications: Current Facility-Administered Medications  Medication Dose Route Frequency Provider Last Rate Last Admin  . acetaminophen (TYLENOL) tablet 650 mg  650 mg Oral Q6H PRN Nwoko, Uchenna E, PA   650 mg at 04/12/21 2053  . alum & mag hydroxide-simeth (MAALOX/MYLANTA) 200-200-20 MG/5ML suspension 30 mL  30 mL Oral Q4H PRN Nwoko, Uchenna E, PA      . carbamazepine (TEGRETOL) chewable tablet 200 mg  200 mg Oral BID Mason Jim, Lauris Serviss E, MD   200 mg at 04/13/21 0816  . clonazePAM (KLONOPIN) disintegrating tablet 0.25 mg  0.25 mg Oral BID Bartholomew Crews E, MD   0.25 mg at 04/13/21 0817  . cyclobenzaprine (FLEXERIL) tablet 10 mg  10 mg Oral BID PRN Nwoko,  Uchenna E, PA   10 mg at 04/12/21 2053  . gabapentin (NEURONTIN) capsule 400 mg  400 mg Oral QID Nwoko, Uchenna E, PA   400 mg at 04/13/21 0816  . hydrOXYzine (ATARAX/VISTARIL) tablet 25 mg  25 mg Oral TID PRN Nwoko, Uchenna E, PA   25 mg at 04/12/21 2053  . magnesium hydroxide (MILK OF MAGNESIA) suspension 30 mL  30 mL Oral Daily PRN Nwoko, Uchenna E, PA      . meloxicam (MOBIC) tablet 7.5 mg  7.5 mg Oral BID Mason Jim, Delorse Shane E, MD   7.5 mg at 04/13/21 0817  . nicotine (NICODERM CQ - dosed in mg/24 hours) patch 21 mg  21 mg Transdermal Daily Nwoko, Uchenna E, PA   21 mg at 04/13/21 0815  . ondansetron (ZOFRAN) tablet 4 mg  4 mg Oral Q8H PRN  Karel Jarvis E, PA   4 mg at 04/12/21 1724  . risperiDONE (RISPERDAL M-TABS) disintegrating tablet 3 mg  3 mg Oral QHS Bartholomew Crews E, MD   3 mg at 04/12/21 2053  . risperiDONE (RISPERDAL M-TABS) disintegrating tablet 3 mg  3 mg Oral Daily Lanter Locket, MD   3 mg at 04/13/21 0817  . traZODone (DESYREL) tablet 100 mg  100 mg Oral QHS Bartholomew Crews E, MD   100 mg at 04/12/21 2053    Lab Results:  Results for orders placed or performed during the hospital encounter of 04/07/21 (from the past 48 hour(s))  CBC with Differential/Platelet     Status: Abnormal   Collection Time: 04/13/21  6:31 AM  Result Value Ref Range   WBC 8.3 4.0 - 10.5 K/uL   RBC 4.55 4.22 - 5.81 MIL/uL   Hemoglobin 13.3 13.0 - 17.0 g/dL   HCT 02.4 (L) 09.7 - 35.3 %   MCV 84.8 80.0 - 100.0 fL   MCH 29.2 26.0 - 34.0 pg   MCHC 34.5 30.0 - 36.0 g/dL   RDW 29.9 24.2 - 68.3 %   Platelets 173 150 - 400 K/uL   nRBC 0.0 0.0 - 0.2 %   Neutrophils Relative % 59 %   Neutro Abs 4.9 1.7 - 7.7 K/uL   Lymphocytes Relative 27 %   Lymphs Abs 2.2 0.7 - 4.0 K/uL   Monocytes Relative 8 %   Monocytes Absolute 0.7 0.1 - 1.0 K/uL   Eosinophils Relative 5 %   Eosinophils Absolute 0.4 0.0 - 0.5 K/uL   Basophils Relative 0 %   Basophils Absolute 0.0 0.0 - 0.1 K/uL   Immature Granulocytes 1 %    Abs Immature Granulocytes 0.06 0.00 - 0.07 K/uL    Comment: Performed at [facility], [address] [place], [place] [address]  Hepatic function panel     Status: Abnormal   Collection Time: 04/13/21  6:31 AM  Result Value Ref Range   Total Protein 6.0 (L) 6.5 - 8.1 g/dL   Albumin 3.4 (L) 3.5 - 5.0 g/dL   AST 22 15 - 41 U/L   ALT 23 0 - 44 U/L   Alkaline Phosphatase 55 38 - 126 U/L   Total Bilirubin 0.5 0.3 - 1.2 mg/dL   Bilirubin, Direct <2.2 0.0 - 0.2 mg/dL   Indirect Bilirubin NOT CALCULATED 0.3 - 0.9 mg/dL    Comment: Performed at [facility], [address] [place], [place] [address]    Blood Alcohol level:  Lab Results  Component Value Date   ETH <10 04/06/2021   ETH <10 07/17/2020    Metabolic Disorder Labs: Lab Results  Component Value Date   HGBA1C 5.4 04/07/2021   MPG 108.28 04/07/2021   No results found for: PROLACTIN Lab Results  Component Value Date   CHOL 219 (H) 04/09/2021   TRIG 159 (H) 04/09/2021   HDL 52 04/09/2021   CHOLHDL 4.2 04/09/2021   VLDL 32 04/09/2021   LDLCALC 135 (H) 04/09/2021   LDLCALC 103 (H) 04/07/2021    Physical Findings: AIMS: Facial and Oral Movements Muscles of Facial Expression: None, normal Lips and Perioral Area: None, normal Jaw: None, normal Tongue: None, normal,Extremity Movements Upper (arms, wrists, hands, fingers): None, normal Lower (legs, knees, ankles, toes): None, normal, Trunk Movements Neck, shoulders, hips: None, normal, Overall Severity Severity of abnormal movements (highest score from questions above): None, normal Incapacitation due to abnormal movements: None, normal  Patient's awareness of abnormal movements (rate only patient's report): No Awareness, Dental Status Current problems with teeth and/or dentures?: No Does patient usually wear dentures?: No      Musculoskeletal: Strength & Muscle Tone: within normal limits Gait & Station: untested today Patient  leans: N/A  Psychiatric Specialty Exam: Physical Exam Vitals reviewed.  HENT:     Head: Normocephalic.  Pulmonary:     Effort: Pulmonary effort is normal.  Neurological:     General: No focal deficit present.     Mental Status: He is alert.     Review of Systems  Respiratory: Negative for shortness of breath.   Cardiovascular: Negative for chest pain.  Gastrointestinal: Negative for constipation, diarrhea, nausea and vomiting.    Blood pressure 122/86, pulse 79, temperature (!) 97.5 F (36.4 C), temperature source Oral, resp. rate 20, height  (1.803 m), weight 91.2 kg, SpO2 99 %.Body mass index is 28.03 kg/m.  General Appearance: improved hygiene - in clean clothes; appears stated age  Eye Contact:  Good  Speech: No longer rapid or rambling - normal fluency  Volume:  Normal  Mood:  Described as "level" - appears less elevated and more euthymic  Affect: no longer grandiose, appears less anxious  Thought Process:  Circumstantial but less tangential  Orientation:  Oriented to month, year, city  Thought Content:  Denies AVH, ideas of reference, first rank symptoms, or paranoia; no delusional statements today  Suicidal Thoughts:  No  Homicidal Thoughts:  No  Memory:  Fair  Judgement:  improving  Insight:  improving  Psychomotor Activity: Normal - no tremors or restlessness  Concentration: Improving  Recall:  [employer] of Knowledge:  Fair  Language:  Fair  Akathisia:  Negative  Assets:  Communication Skills Desire for Improvement Resilience Social Support  ADL's:  independent  Cognition:  Improving  Sleep:  Number of Hours: 4.75   Treatment Plan Summary: Diagnoses / Active Problems: Bipolar I MRE manic severe with psychotic features H/o perforated rectum s/p partial colectomy and colostomy  PLAN: 1. Safety and Monitoring:             -- Involuntary admission to inpatient psychiatric unit for safety, stabilization and treatment - 2nd opinion completed              -- Daily contact with patient to assess and evaluate symptoms and progress in treatment             -- Patient's case to be discussed in multi-disciplinary team meeting             -- Observation Level : q15 minute checks             -- Vital signs:  q12 hours             -- Precautions: suicide  2. Psychiatric Diagnoses and Treatment:  Bipolar I MRE manic severe with psychotic features  -- Continue Tegretol  bid (04/13/21: Tegretol level 8.1, WBC 8.3, H/H 13.3/38.6, platelets 173; AST 22, ALT 23)             -- Continue Risperdal M tabs  bid for residual delusions/mania              -- Cogentin 0.5mg  bid PRN EPS/Tremor             -- Continue home dose of Neurontin  qid to help with pain and mood stabilization             --  Reduce Klonopin 0.25mg  to qd for antimanic properties of medication with goal of weaning off prior to discharge             -- Continue Trazodone  qhs for sleep             -- Has PRN Vistaril  tid for anxiety             -- Agitation protocol - Ativan  po or IM q6 hours PRN and Haldol  po or IM q6 hours PRN             -- Metabolic profile and EKG monitoring obtained while on an atypical antipsychotic (BMI: 28.03; Lipid Panel:cholesterol 212, triglycerides 307, HDL 48, LDL 103; HbgA1c:5.4; QTc:418ms and on repeat )              -- Encouraged patient to participate in unit milieu and in scheduled group therapies              -- Short Term Goals: Ability to identify changes in lifestyle to reduce recurrence of condition will improve, Ability to demonstrate self-control will improve and Compliance with prescribed medications will improve             -- Long Term Goals: Improvement in symptoms so as ready for discharge              3. Medical Issues Being Addressed:              Tobacco Use Disorder             -- Nicotine patch /24 hours ordered             -- Smoking cessation encouraged              H/o perforated rectum s/p  partial colectomy and colostomy             -- Ordered colostomy supplies and nursing to assist as needed              Chronic pain syndrome             -- Holding Cymbalta in the event is contributing to mania             -- Continue Neurontin  qid             -- Flexeril  bid PRN spasm             -- Continue Mobic 7.5mg  bid with food   Hyperlipidemia/Hypertriglyceridemia  -- Repeat fasting labs 04/09/21: Cholesterol 219, triglycerides 159, HDL 52, LDL 135  -- Will encourage dietary changes, increased exercise and repeat lab monitoring with PCP as an outpatient  -- Low albumin and low protein - will offer Ensure   Based on Collateral from Mother obtained by SW, there is concern for possible recent TBI  -- Cancelled Head CT since he already had CT scan 12/21 which showed normal appearance of the brain with non-acute right nasal arch and medial left orbit fractures which have occurred since 2015  -- Would benefit from Neurology f/u after discharge   4. Discharge Planning:              -- Social work and case management to assist with discharge planning and identification of hospital follow-up needs prior to discharge             -- Estimated LOS: 3-4 days             --  Discharge Concerns: Need to establish a safety plan; Medication compliance and effectiveness             -- Discharge Goals: Return home with outpatient referrals for mental health follow-up including medication management/psychotherapy  I certify that inpatient services furnished can reasonably be expected to improve the patient's condition.     Westfall Locket, MD, FAPA 04/13/2021, 8:26 AM

## 2021-04-13 NOTE — Progress Notes (Signed)
Recreation Therapy Notes  Date: 4.18.22 Time: 1000 Location: 500 [address] Dayroom   Group Topic: Coping Skills   Goal Area(s) Addresses: Patient will define what a coping skill is. Patient will work with peer to create a list of healthy coping skills beginning with each letter of the alphabet. Patient will successfully identify positive coping skills they can use post d/c.  Patient will acknowledge benefit(s) of using learned coping skills post d/c.   Intervention: Group work   Activity: Coping A to Z.  Patients were given a blank worksheet titled "Coping Skills A-Z" and asked to pair up with a peer. Partners were instructed to come up with at least one positive coping skill per letter of the alphabet, addressing a specific challenge (ex: stress, anger, anxiety, depression, grief, doubt, isolation, self-harm/suicidal thoughts, substance use). Patients were given 15 minutes to brainstorm with their peer, before ideas were presented to the large group. Patients and LRT debriefed on the importance of coping skill selection based on situation and back-up plans when a skill tried is not effective.   Education: Pharmacologist, Scientist, physiological, Discharge Planning.    Education Outcome:  Acknowledges education/Verbalizes understanding/In group clarification offered/Additional education needed   Clinical Observations/Feedback: Pt did not attend group session.     Caroll Rancher, LRT/CTRS    Caroll Rancher A 04/13/2021 11:38 AM

## 2021-04-13 NOTE — Plan of Care (Signed)
  Problem: Activity: Goal: Interest or engagement in activities will improve Outcome: Progressing   Problem: Health Behavior/Discharge Planning: Goal: Compliance with treatment plan for underlying cause of condition will improve Outcome: Progressing

## 2021-04-13 NOTE — [facility] Counselor (Signed)
CSW met with pt's sister, Vertis Kelch in the [facility] lobby. Pt's sister had concerns about this patients upcoming court date. CSW will contact pt's probation officer and clerk of court to have this court date continued.      Darletta Moll MSW, LCSW Clincal Social Worker  [facility]

## 2021-04-13 NOTE — [facility] Counselor (Signed)
CSW spoke with patients Probation Officer, Dat Derksen [phone]) who stated that he would have this patients court hearing that is scheduled for tomorrow continued to a later date.   Ruthann Cancer MSW, LCSW Clincal Social Worker  [facility]

## 2021-04-13 NOTE — [facility] Group Notes (Signed)
[facility] LCSW Group Therapy  04/13/2021 2:11 PM  Type of Therapy:  Group Therapy  Participation Level:  Active  Participation Quality:  Appropriate  Affect:  Appropriate  Cognitive:  Alert and Oriented  Insight:  Developing/Improving  Engagement in Therapy:  Engaged  Modes of Intervention:  Clarification  Summary of Progress/Problems: Group topic was social support system and social circles.  Pt participated well and reported that his peers and family were good supports in his life. Patient reports that he enjoys talking about current events with his peers and he feels like he has been able to talk a lot with peers in the past couple of weeks.  Patient offered to the group different ways to connect with peers and start conversations.   Mardy Lucier E Nakeisha Greenhouse 04/13/2021, 2:11 PM

## 2021-04-13 NOTE — Tx Team (Signed)
Interdisciplinary Treatment and Diagnostic Plan Update  04/13/2021 Time of Session: 10:42am ARVO EALY MRN: [id number]  Principal Diagnosis: Bipolar I disorder, current or most recent episode manic, with psychotic features (HCC)  Secondary Diagnoses: Principal Problem:   Bipolar I disorder, current or most recent episode manic, with psychotic features (HCC) Active Problems:   Colostomy in place [facility])   Chronic pain syndrome   Tobacco abuse   Current Medications:  Current Facility-Administered Medications  Medication Dose Route Frequency Provider Last Rate Last Admin  . acetaminophen (TYLENOL) tablet 650 mg  650 mg Oral Q6H PRN Nwoko, Uchenna E, PA   650 mg at 04/12/21 2053  . alum & mag hydroxide-simeth (MAALOX/MYLANTA) 200-200-20 MG/5ML suspension 30 mL  30 mL Oral Q4H PRN Nwoko, Uchenna E, PA      . carbamazepine (TEGRETOL) chewable tablet 200 mg  200 mg Oral BID Mason Jim, Amy E, MD   200 mg at 04/13/21 0816  . clonazePAM (KLONOPIN) disintegrating tablet 0.25 mg  0.25 mg Oral BID Bartholomew Crews E, MD   0.25 mg at 04/13/21 0817  . cyclobenzaprine (FLEXERIL) tablet 10 mg  10 mg Oral BID PRN Nwoko, Uchenna E, PA   10 mg at 04/12/21 2053  . gabapentin (NEURONTIN) capsule 400 mg  400 mg Oral QID Nwoko, Uchenna E, PA   400 mg at 04/13/21 0816  . hydrOXYzine (ATARAX/VISTARIL) tablet 25 mg  25 mg Oral TID PRN Nwoko, Uchenna E, PA   25 mg at 04/12/21 2053  . magnesium hydroxide (MILK OF MAGNESIA) suspension 30 mL  30 mL Oral Daily PRN Nwoko, Uchenna E, PA      . meloxicam (MOBIC) tablet 7.5 mg  7.5 mg Oral BID Mason Jim, Amy E, MD   7.5 mg at 04/13/21 0817  . nicotine (NICODERM CQ - dosed in mg/24 hours) patch 21 mg  21 mg Transdermal Daily Nwoko, Uchenna E, PA   21 mg at 04/13/21 0815  . ondansetron (ZOFRAN) tablet 4 mg  4 mg Oral Q8H PRN Nwoko, Uchenna E, PA   4 mg at 04/12/21 1724  . risperiDONE (RISPERDAL M-TABS) disintegrating tablet 3 mg  3 mg Oral QHS Bartholomew Crews E, MD   3 mg at  04/12/21 2053  . risperiDONE (RISPERDAL M-TABS) disintegrating tablet 3 mg  3 mg Oral Daily Talcott Locket, MD   3 mg at 04/13/21 0817  . traZODone (DESYREL) tablet 100 mg  100 mg Oral QHS Bartholomew Crews E, MD   100 mg at 04/12/21 2053   PTA Medications: Medications Prior to Admission  Medication Sig Dispense Refill Last Dose  . clonazePAM (KLONOPIN) 2 MG tablet Take 2 mg by mouth 3 (three) times daily as needed for anxiety. (Patient not taking: Reported on 04/06/2021)     . cyclobenzaprine (FLEXERIL) 10 MG tablet Take 1 tablet (10 mg total) by mouth 2 (two) times daily as needed for muscle spasms. 60 tablet 1   . DULoxetine (CYMBALTA) 60 MG capsule Take 1 capsule (60 mg total) by mouth daily. 60 capsule 5   . gabapentin (NEURONTIN) 400 MG capsule TAKE (1) CAPSULE BY MOUTH FOUR TIMES DAILY. 120 capsule 0   . meloxicam (MOBIC) 7.5 MG tablet Take 1 tablet (7.5 mg total) by mouth 2 (two) times daily. 60 tablet 2   . ondansetron (ZOFRAN-ODT) 4 MG disintegrating tablet Take 1 tablet (4 mg total) by mouth every 8 (eight) hours as needed for nausea or vomiting. (Patient not taking: Reported on 04/06/2021) 20 tablet 0   .  zolpidem (AMBIEN) 10 MG tablet Take 10 mg by mouth daily as needed. (Patient not taking: Reported on 04/06/2021)       Patient Stressors: Medication change or noncompliance Other: delusions  Patient Strengths: Average or above average intelligence Capable of independent living Supportive family/friends  Treatment Modalities: Medication Management, Group therapy, Case management,  1 to 1 session with clinician, Psychoeducation, Recreational therapy.   Physician Treatment Plan for Primary Diagnosis: Bipolar I disorder, current or most recent episode manic, with psychotic features (HCC) Long Term Goal(s): Improvement in symptoms so as ready for discharge   Short Term Goals: Ability to identify changes in lifestyle to reduce recurrence of condition will improve Ability to  demonstrate self-control will improve Compliance with prescribed medications will improve  Medication Management: Evaluate patient's response, side effects, and tolerance of medication regimen.  Therapeutic Interventions: 1 to 1 sessions, Unit Group sessions and Medication administration.  Evaluation of Outcomes: Progressing  Physician Treatment Plan for Secondary Diagnosis: Principal Problem:   Bipolar I disorder, current or most recent episode manic, with psychotic features (HCC) Active Problems:   Colostomy in place [facility])   Chronic pain syndrome   Tobacco abuse  Long Term Goal(s): Improvement in symptoms so as ready for discharge   Short Term Goals: Ability to identify changes in lifestyle to reduce recurrence of condition will improve Ability to demonstrate self-control will improve Compliance with prescribed medications will improve     Medication Management: Evaluate patient's response, side effects, and tolerance of medication regimen.  Therapeutic Interventions: 1 to 1 sessions, Unit Group sessions and Medication administration.  Evaluation of Outcomes: Progressing   RN Treatment Plan for Primary Diagnosis: Bipolar I disorder, current or most recent episode manic, with psychotic features (HCC) Long Term Goal(s): Knowledge of disease and therapeutic regimen to maintain health will improve  Short Term Goals: Ability to verbalize frustration and anger appropriately will improve, Ability to demonstrate self-control and Ability to participate in decision making will improve  Medication Management: RN will administer medications as ordered by provider, will assess and evaluate patient's response and provide education to patient for prescribed medication. RN will report any adverse and/or side effects to prescribing provider.  Therapeutic Interventions: 1 on 1 counseling sessions, Psychoeducation, Medication administration, Evaluate responses to treatment, Monitor vital signs and  CBGs as ordered, Perform/monitor CIWA, COWS, AIMS and Fall Risk screenings as ordered, Perform wound care treatments as ordered.  Evaluation of Outcomes: Progressing   LCSW Treatment Plan for Primary Diagnosis: Bipolar I disorder, current or most recent episode manic, with psychotic features (HCC) Long Term Goal(s): Safe transition to appropriate next level of care at discharge, Engage patient in therapeutic group addressing interpersonal concerns.  Short Term Goals: Engage patient in aftercare planning with referrals and resources, Increase social support and Increase ability to appropriately verbalize feelings  Therapeutic Interventions: Assess for all discharge needs, 1 to 1 time with Social worker, Explore available resources and support systems, Assess for adequacy in community support network, Educate family and significant other(s) on suicide prevention, Complete Psychosocial Assessment, Interpersonal group therapy.  Evaluation of Outcomes: Progressing   Progress in Treatment: Attending groups: Yes. Participating in groups: Yes. Taking medication as prescribed: Yes. Toleration medication: Yes. Family/Significant other contact made: Yes, individual(s) contacted:  mother Patient understands diagnosis: Yes. Discussing patient identified problems/goals with staff: Yes. Medical problems stabilized or resolved: Yes. Denies suicidal/homicidal ideation: Yes. Issues/concerns per patient self-inventory: No. Other: None  New problem(s) identified: No, Describe:  None  New Short Term/Long Term  Goal(s):medication stabilization, elimination of SI thoughts, development of comprehensive mental wellness plan.  Patient Goals:  "try and sleep."  Discharge Plan or Barriers: Patient is to return to apartment at discharge. Patient is to follow up with [facility] outpatient in [place] and [place] for medication management and therapy.    Reason for Continuation of Hospitalization: Delusions   Mania Medication stabilization  Estimated Length of Stay: 3-5 days  Attendees: Patient:  04/08/2021   Physician:  04/08/2021   Nursing:  04/08/2021   RN Care Manager: 04/08/2021   Social Worker: Ruthann Cancer, LCSW 04/08/2021   Recreational Therapist:  04/08/2021   Other:  04/08/2021   Other:  04/08/2021   Other: 04/08/2021       Scribe for Treatment Team: Otelia Santee, LCSW 04/13/2021 10:42 AM

## 2021-04-13 NOTE — Progress Notes (Signed)
   04/13/21 0900  Psych Admission Type (Psych Patients Only)  Admission Status Involuntary  Psychosocial Assessment  Patient Complaints Isolation  Eye Contact Fair  Facial Expression Anxious  Affect Anxious;Preoccupied  Speech Tangential  Interaction Assertive  Motor Activity Slow  Appearance/Hygiene Improved  Behavior Characteristics Cooperative  Mood Anxious  Thought Process  Coherency Disorganized;Tangential  Content Obsessions;Preoccupation;Delusions  Delusions Paranoid  Perception WDL  Hallucination None reported or observed  Judgment Poor  Confusion Mild  Danger to Self  Current suicidal ideation? Denies  Danger to Others  Danger to Others None reported or observed  Dar Note:Patient presents with a calm affect and mood.  Denies suicidal thoughts, auditory and visual hallucinations.  Medications given as prescribed.  Routine safety checks maintained.  Patient is safe on and off the unit.

## 2021-04-14 DIAGNOSIS — F312 Bipolar disorder, current episode manic severe with psychotic features: Secondary | ICD-10-CM | POA: Diagnosis not present

## 2021-04-14 MED ORDER — CARBAMAZEPINE 100 MG PO CHEW
200.0000 mg | CHEWABLE_TABLET | Freq: Two times a day (BID) | ORAL | Status: DC
  Administered 2021-04-14 – 2021-04-20 (×12): 200 mg via ORAL
  Filled 2021-04-14 (×14): qty 2

## 2021-04-14 MED ORDER — GABAPENTIN 400 MG PO CAPS
800.0000 mg | ORAL_CAPSULE | Freq: Two times a day (BID) | ORAL | Status: DC
  Administered 2021-04-14: 400 mg via ORAL
  Administered 2021-04-14 – 2021-04-19 (×11): 800 mg via ORAL
  Filled 2021-04-14 (×16): qty 2

## 2021-04-14 MED ORDER — GABAPENTIN 400 MG PO CAPS
400.0000 mg | ORAL_CAPSULE | Freq: Every day | ORAL | Status: DC
  Administered 2021-04-15 – 2021-04-19 (×5): 400 mg via ORAL
  Filled 2021-04-14 (×7): qty 1

## 2021-04-14 MED ORDER — RISPERIDONE 3 MG PO TABS
3.0000 mg | ORAL_TABLET | Freq: Two times a day (BID) | ORAL | Status: DC
  Administered 2021-04-14 – 2021-04-19 (×10): 3 mg via ORAL
  Filled 2021-04-14 (×14): qty 1

## 2021-04-14 NOTE — Progress Notes (Signed)
Recreation Therapy Notes  Date: 4.19.22 Time: 1000 Location: 500 Hall Dayroom  Group Topic: Communication  Goal Area(s) Addresses:  Patient will effectively communicate with peers in group.  Patient will verbalize benefit of healthy communication. Patient will verbalize positive effect of healthy communication on post d/c goals.  Patient will identify communication techniques that made activity effective for group.   Intervention: Blank paper, Pencils, Geometric Pictures  Activity: Geometric Drawings.  There were three volunteers for the activity.  Each volunteer would describe a picture to the rest of the group.  The rest of the group had to draw the picture how it was described to them.  If they missed any of the instructions, they could only ask the presenter to repeat themselves.  Once the the patients finished their drawings, the would compare it to the original to see how close they came to the original picture.       Education: Communication, Discharge Planning  Education Outcome: Acknowledges understanding/In group clarification offered/Needs additional education.   Clinical Observations/Feedback:  Pt did not attend group session.    Caroll Rancher, LRT/CTRS     Caroll Rancher A 04/14/2021 11:34 AM

## 2021-04-14 NOTE — Progress Notes (Addendum)
   04/14/21 0611  Vital Signs  Temp 97.6 F (36.4 C)  Temp Source Oral  Pulse Rate 75  Resp 20  BP 123/77  BP Location Right Arm  BP Method Automatic  Patient Position (if appropriate) Sitting  Oxygen Therapy  SpO2 98 %    D: Patient denies SI/HI/AVH. Pt. Rated anxiety 3/10 and depression 4/10. Pt. Complained of "all over" pain. Pt. Was isolative in his room. A:  Patient took scheduled medicine. Pt given 25 mg of Vistaril for anxiety and 650 mg of Tylenol for general pain. Family member brought in colostomy supplies.  Support and encouragement provided Routine safety checks conducted every 15 minutes. Patient  Informed to notify staff with any concerns.   R: Safety maintained.  @1907  Pt changed colostomy bag.

## 2021-04-14 NOTE — [facility] Group Notes (Signed)
[facility] Group Notes:  (Nursing/MHT/Case Management/Adjunct)  Date:  04/14/2021  Time:  2:56 PM  Type of Therapy:  Group Therapy  Participation Level:  Active  Participation Quality:  Appropriate and Attentive  Affect:  Appropriate  Cognitive:  Alert and Appropriate  Insight:  Appropriate and Good  Engagement in Group:  Improving  Modes of Intervention:  Orientation  Summary of Progress/Problems: His goal for today is to get a better understanding with medication management so that when he goes home, he will be able to have everything under control.   Asley Baskerville J Philipe Laswell 04/14/2021, 2:56 PM

## 2021-04-14 NOTE — Progress Notes (Signed)
Adult Psychoeducational Group Note  Date:  04/14/2021 Time:  2:35 AM  Group Topic/Focus:  Wrap-Up Group:   The focus of this group is to help patients review their daily goal of treatment and discuss progress on daily workbooks.  Participation Level:  Active  Participation Quality:  Appropriate  Affect:  Anxious  Cognitive:  Disorganized and Confused  Insight: Limited  Engagement in Group:  Limited  Modes of Intervention:  Discussion  Additional Comments: Pt stated his goal for today was to focus on his treatment plan and to talk with his doctor about his blood pressure issues.  Pt stated he accomplished his goals today. Pt stated he talked with his doctor and his social worker about his care today. Pt rated his overall day an 8 out of 10. Pt stated he was able to contact his family friend today which improved his overall day. Pt stated he felt better about himself tonight. Pt stated he attend all meals held today.  Pt stated he attend all groups held today. Pt stated he took all medications provided today. Pt stated his appetite was good today. Pt rated sleep last night was good. Pt stated the goal tonight was to get some rest. Pt stated he was in severe  physical pain tonight. Pt stated he had muscle pain all over his body. Pt rated the muscle pain all over his body an 8 out of 10. Pt nurse was updated on situation.  Pt denied auditory and visual hallucinations tonight. Pt deny thoughts of harming himself or others tonight.  Pt stated he would alert staff if anything changed.   Felipa Furnace 04/14/2021, 2:35 AM

## 2021-04-14 NOTE — Progress Notes (Signed)
[facility] MD Progress Note  04/14/2021 12:08 PM [name] [name]  MRN:  [id number]   Chief complaint: mania with psychosis  Subjective:  [name] [name] is a 55 y.o. male with a history of bipolar I d/o, who was initially admitted for inpatient psychiatric hospitalization on 04/07/2021 for management of acute mania with psychosis. The patient is currently on Hospital Day 7.   Chart Review from last 24 hours:  The patient's chart was reviewed and nursing notes were reviewed. The patient's case was discussed in multidisciplinary team meeting. Per nursing, there were no behavioral issues or safety concerns noted. He remained delusional but cooperative. Per MAR, he was compliant with scheduled medications. He received Vistaril for anxiety.   Information Obtained Today During Patient Interview: The patient was seen and evaluated on the unit.  Patient is calm and cooperative.  He states he slept well, and has been enjoying the food at the hospital.  He reports that he is feeling "better", although still has anxiety, specifically about his medications.  He reports that Klonopin has helped, but he is agreeable to continuing to wean off of it so as not to have dependence.  He states that he has been having more pain, and in reviewing medications notes that he has not been getting his gabapentin as how he takes it at home.  Patient reports that he has been on disability since 2009 due to chronic pain, chronic fatigue, and bipolar illness.  He states that he is planning on selling moving from his [place] home and moving to an apartment.  He intends to continue living in [place].  Patient is agreeable to medication changes after education regarding medications and uses.  See plan below for medication changes.  Patient is managing his own colostomy care.  He is denying other complaints. He denies AVH, paranoia, ideas of reference, first rank symptoms, or magical thinking. He does not make grandiose delusional  statements.  Principal Problem: Bipolar I disorder, current or most recent episode manic, with psychotic features (HCC) Diagnosis: Principal Problem:   Bipolar I disorder, current or most recent episode manic, with psychotic features (HCC) Active Problems:   Colostomy in place [facility])   Chronic pain syndrome   Tobacco abuse   BP (high blood pressure)  Total Time Spent in Direct Patient Care:  I personally spent 35 minutes on the unit in direct patient care. The direct patient care time included face-to-face time with the patient, reviewing the patient's chart, communicating with other professionals, and coordinating care. Greater than 50% of this time was spent in counseling or coordinating care with the patient regarding goals of hospitalization, psycho-education, and discharge planning needs.  Past Psychiatric History: see admission H&P  Past Medical History:  Past Medical History:  Diagnosis Date  . Anxiety   . Bipolar 1 disorder (HCC)   . Chronic fatigue   . Chronic pain   . Complete intestinal obstruction (HCC)   . Depression    Phreesia 12/03/2020  . Fecal peritonitis (HCC) 08/03/2019  . Fibromyalgia   . Hyperlipidemia    Phreesia 12/03/2020  . Ileus, postoperative (HCC)   . Perforated rectum (HCC) 08/03/2019    Past Surgical History:  Procedure Laterality Date  . COLON RESECTION SIGMOID  08/03/2019   Procedure: COLON RESECTION SIGMOID;  Surgeon: Lucretia Roers, MD;  Location: [facility];  Service: General;;  . COLOSTOMY N/A 08/03/2019   Procedure: COLOSTOMY;  Surgeon: Lucretia Roers, MD;  Location: [facility];  Service:  General;  Laterality: N/A;  . FLEXIBLE SIGMOIDOSCOPY N/A 08/03/2019   Procedure: FLEXIBLE SIGMOIDOSCOPY;  Surgeon: Corbin Ade, MD;  Location: [facility];  Service: Endoscopy;  Laterality: N/A;  . KNEE ARTHROSCOPY Left 2006   miniscus tear  . LAPAROTOMY N/A 08/03/2019   Procedure: EXPLORATORY LAPAROTOMY;  Surgeon: Lucretia Roers, MD;  Location: [facility];   Service: General;  Laterality: N/A;  . LYMPH GLAND EXCISION Left 1983  . SMALL INTESTINE SURGERY N/A    Phreesia 12/03/2020  . TONSILLECTOMY     Family History:  Family History  Problem Relation Age of Onset  . Diabetes Mother   . Heart attack Father   . Diabetes Maternal Grandmother   . Colon cancer Neg Hx   . Colon polyps Neg Hx    Family Psychiatric  History: see admission H&P  Social History:  Social History   Substance and Sexual Activity  Alcohol Use Not Currently   Comment: occasional; denied 10/02/19     Social History   Substance and Sexual Activity  Drug Use Not Currently  . Types: Cocaine   Comment: last used in December 2020.     Social History   Socioeconomic History  . Marital status: Legally Separated    Spouse name: Not on file  . Number of children: Not on file  . Years of education: Not on file  . Highest education level: Not on file  Occupational History  . Not on file  Tobacco Use  . Smoking status: Current Every Day Smoker    Packs/day: 1.00    Years: 10.00    Pack years: 10.00  . Smokeless tobacco: Never Used  Vaping Use  . Vaping Use: Some days  Substance and Sexual Activity  . Alcohol use: Not Currently    Comment: occasional; denied 10/02/19  . Drug use: Not Currently    Types: Cocaine    Comment: last used in December 2020.   Marland Kitchen Sexual activity: Not Currently  Other Topics Concern  . Not on file  Social History Narrative   Pt lives alone and is on disability. Counts his mother, sister and aunt as his social supports.    Social Determinants of Health   Financial Resource Strain: Not on file  Food Insecurity: Not on file  Transportation Needs: Not on file  Physical Activity: Not on file  Stress: Not on file  Social Connections: Not on file   Sleep: Good  Appetite:  Good  Current Medications: Current Facility-Administered Medications  Medication Dose Route Frequency Provider Last Rate Last Admin  . acetaminophen  (TYLENOL) tablet 650 mg  650 mg Oral Q6H PRN Nwoko, Uchenna E, PA   650 mg at 04/14/21 1156  . alum & mag hydroxide-simeth (MAALOX/MYLANTA) 200-200-20 MG/5ML suspension 30 mL  30 mL Oral Q4H PRN Nwoko, Uchenna E, PA      . carbamazepine (TEGRETOL) chewable tablet 200 mg  200 mg Oral BID Mason Jim, Amy E, MD   200 mg at 04/14/21 0809  . clonazepam (KLONOPIN) disintegrating tablet 0.25 mg  0.25 mg Oral Daily Mason Jim, Amy E, MD   0.25 mg at 04/14/21 0809  . cyclobenzaprine (FLEXERIL) tablet 10 mg  10 mg Oral BID PRN Nwoko, Uchenna E, PA   10 mg at 04/12/21 2053  . feeding supplement (ENSURE ENLIVE / ENSURE PLUS) liquid 237 mL  237 mL Oral BID BM Mason Jim, Amy E, MD   237 mL at 04/13/21 1547  . gabapentin (NEURONTIN) capsule 400 mg  400 mg Oral QID Nwoko, Uchenna E, PA   400 mg at 04/14/21 1156  . hydrOXYzine (ATARAX/VISTARIL) tablet 25 mg  25 mg Oral TID PRN Nwoko, Uchenna E, PA   25 mg at 04/14/21 1156  . magnesium hydroxide (MILK OF MAGNESIA) suspension 30 mL  30 mL Oral Daily PRN Nwoko, Uchenna E, PA      . meloxicam (MOBIC) tablet 7.5 mg  7.5 mg Oral BID Mason JimSingleton, Amy E, MD   7.5 mg at 04/14/21 0809  . nicotine (NICODERM CQ - dosed in mg/24 hours) patch 21 mg  21 mg Transdermal Daily Nwoko, Uchenna E, PA   21 mg at 04/14/21 0808  . ondansetron (ZOFRAN) tablet 4 mg  4 mg Oral Q8H PRN Nwoko, Uchenna E, PA   4 mg at 04/12/21 1724  . risperiDONE (RISPERDAL M-TABS) disintegrating tablet 3 mg  3 mg Oral QHS Molino LocketSingleton, Amy E, MD   3 mg at 04/13/21 2047  . risperiDONE (RISPERDAL M-TABS) disintegrating tablet 3 mg  3 mg Oral Daily Bartholomew CrewsSingleton, Amy E, MD   3 mg at 04/14/21 0810  . traZODone (DESYREL) tablet 100 mg  100 mg Oral QHS Kary LocketSingleton, Amy E, MD   100 mg at 04/13/21 2047    Lab Results:  Results for orders placed or performed during the hospital encounter of 04/07/21 (from the past 48 hour(s))  CBC with Differential/Platelet     Status: Abnormal   Collection Time: 04/13/21  6:31 AM  Result Value  Ref Range   WBC 8.3 4.0 - 10.5 K/uL   RBC 4.55 4.22 - 5.81 MIL/uL   Hemoglobin 13.3 13.0 - 17.0 g/dL   HCT 95.638.6 (L) 38.739.0 - 56.452.0 %   MCV 84.8 80.0 - 100.0 fL   MCH 29.2 26.0 - 34.0 pg   MCHC 34.5 30.0 - 36.0 g/dL   RDW 33.212.5 95.111.5 - 88.415.5 %   Platelets 173 150 - 400 K/uL   nRBC 0.0 0.0 - 0.2 %   Neutrophils Relative % 59 %   Neutro Abs 4.9 1.7 - 7.7 K/uL   Lymphocytes Relative 27 %   Lymphs Abs 2.2 0.7 - 4.0 K/uL   Monocytes Relative 8 %   Monocytes Absolute 0.7 0.1 - 1.0 K/uL   Eosinophils Relative 5 %   Eosinophils Absolute 0.4 0.0 - 0.5 K/uL   Basophils Relative 0 %   Basophils Absolute 0.0 0.0 - 0.1 K/uL   Immature Granulocytes 1 %   Abs Immature Granulocytes 0.06 0.00 - 0.07 K/uL    Comment: Performed at [facility], [address] [place], [place] [address]  Carbamazepine level, total     Status: None   Collection Time: 04/13/21  6:31 AM  Result Value Ref Range   Carbamazepine Lvl 8.1 4.0 - 12.0 ug/mL    Comment: Performed at [facility], [address] [place], [place] [address]  Hepatic function panel     Status: Abnormal   Collection Time: 04/13/21  6:31 AM  Result Value Ref Range   Total Protein 6.0 (L) 6.5 - 8.1 g/dL   Albumin 3.4 (L) 3.5 - 5.0 g/dL   AST 22 15 - 41 U/L   ALT 23 0 - 44 U/L   Alkaline Phosphatase 55 38 - 126 U/L   Total Bilirubin 0.5 0.3 - 1.2 mg/dL   Bilirubin, Direct <1.0<0.1 0.0 - 0.2 mg/dL   Indirect Bilirubin NOT CALCULATED 0.3 - 0.9 mg/dL    Comment: Performed at [facility], [address] [place], [place] [address]    Blood Alcohol level:  Lab Results  Component Value Date   ETH <10 04/06/2021   ETH <10 07/17/2020    Metabolic Disorder Labs: Lab Results  Component Value Date   HGBA1C 5.4 04/07/2021   MPG 108.28 04/07/2021   No results found for: PROLACTIN Lab Results  Component Value Date   CHOL 219 (H) 04/09/2021   TRIG 159 (H) 04/09/2021   HDL 52 04/09/2021   CHOLHDL 4.2  04/09/2021   VLDL 32 04/09/2021   LDLCALC 135 (H) 04/09/2021   LDLCALC 103 (H) 04/07/2021    Physical Findings: AIMS: Facial and Oral Movements Muscles of Facial Expression: None, normal Lips and Perioral Area: None, normal Jaw: None, normal Tongue: None, normal,Extremity Movements Upper (arms, wrists, hands, fingers): None, normal Lower (legs, knees, ankles, toes): None, normal, Trunk Movements Neck, shoulders, hips: None, normal, Overall Severity Severity of abnormal movements (highest score from questions above): None, normal Incapacitation due to abnormal movements: None, normal Patient's awareness of abnormal movements (rate only patient's report): No Awareness, Dental Status Current problems with teeth and/or dentures?: No Does patient usually wear dentures?: No      Musculoskeletal: Strength & Muscle Tone: within normal limits Gait & Station: Antalgic gait Patient leans: N/A  Psychiatric Specialty Exam: Physical Exam Vitals and nursing note reviewed.  Constitutional:      General: He is in acute distress.     Appearance: Normal appearance.  HENT:     Head: Normocephalic.     Right Ear: External ear normal.     Left Ear: External ear normal.     Nose: Nose normal.  Eyes:     Extraocular Movements: Extraocular movements intact.  Cardiovascular:     Rate and Rhythm: Normal rate.  Pulmonary:     Effort: Pulmonary effort is normal.  Musculoskeletal:     Cervical back: Normal range of motion.     Comments: Stiff gait with a limp  Neurological:     General: No focal deficit present.     Mental Status: He is alert and oriented to person, place, and time.     Review of Systems  Constitutional: Negative.   Respiratory: Negative for shortness of breath.   Cardiovascular: Negative for chest pain.  Gastrointestinal: Negative for constipation, diarrhea, nausea and vomiting.  Musculoskeletal: Positive for arthralgias, gait problem and myalgias.   Psychiatric/Behavioral: Negative for agitation, behavioral problems, confusion, decreased concentration, dysphoric mood, hallucinations, self-injury, sleep disturbance and suicidal ideas. The patient is nervous/anxious. The patient is not hyperactive.     Blood pressure 120/82, pulse 79, temperature 97.6 F (36.4 C), temperature source Oral, resp. rate 20, height 5\' 11"  (1.803 m), weight 91.2 kg, SpO2 98 %.Body mass index is 28.03 kg/m.  General Appearance: Good hygiene, appears stated age  Eye Contact:  Good  Speech: normal fluency  Volume:  Normal  Mood: Calm, euthymic  Affect: Congruent with some anxiety  Thought Process:  Circumstantial   Orientation:  Oriented to person, place, time, situation  Thought Content:  Denies AVH, ideas of reference, first rank symptoms, or paranoia; no delusional statements today  Suicidal Thoughts:  No  Homicidal Thoughts:  No  Memory: Good  Judgement: Fair  Insight: Fair  Psychomotor Activity: Normal - no tremors or restlessness  Concentration: Improving  Recall:  of Knowledge:  Fair  Language:  Fair  Akathisia:  Negative  Assets:  Communication Skills Desire for Improvement  Resilience Social Support  ADL's:  independent  Cognition:  Improving  Sleep:  Number of Hours: 6.5   Treatment Plan Summary: Diagnoses / Active Problems: Bipolar I MRE manic severe with psychotic features H/o perforated rectum s/p partial colectomy and colostomy  PLAN: 1. Safety and Monitoring:             -- Involuntary admission to inpatient psychiatric unit for safety, stabilization and treatment - 2nd opinion completed             -- Daily contact with patient to assess and evaluate symptoms and progress in treatment             -- Patient's case to be discussed in multi-disciplinary team meeting             -- Observation Level : q15 minute checks             -- Vital signs:  q12 hours             -- Precautions: suicide  2. Psychiatric  Diagnoses and Treatment:  Bipolar I MRE manic severe with psychotic features  -- Continue Tegretol 200mg  bid (04/13/21: Tegretol level 8.1, WBC 8.3, H/H 13.3/38.6, platelets 173; AST 22, ALT 23)             --Change to Risperdal tablet 3mg  bid with breakfast and at bedtime for residual delusions/mania              --Change Neurontin to 400 mg with breakfast, 800 mg with lunch and supper to help with pain and mood stabilization             --discontinue Klonopin 0.25mg  to qd for antimanic properties of medication and continue to monitor (goal of weaning off prior to discharge)             -- Continue Trazodone 100mg  qhs for sleep             -- Has PRN Vistaril 25mg  tid for anxiety             -- Agitation protocol - Ativan 1mg  po or IM q6 hours PRN and Haldol 5mg  po or IM q6 hours PRN             -- Metabolic profile and EKG monitoring obtained while on an atypical antipsychotic (BMI: 28.03; Lipid Panel:cholesterol 212, triglycerides 307, HDL 48, LDL 103; HbgA1c:5.4; QTc:451ms and on repeat )              -- Encouraged patient to participate in unit milieu and in scheduled group therapies              -- Short Term Goals: Ability to identify changes in lifestyle to reduce recurrence of condition will improve, Ability to demonstrate self-control will improve and Compliance with prescribed medications will improve             -- Long Term Goals: Improvement in symptoms so as ready for discharge              3. Medical Issues Being Addressed:              Tobacco Use Disorder             -- Nicotine patch 21mg /24 hours ordered             -- Smoking cessation encouraged              H/o perforated rectum s/p partial colectomy and colostomy             --  Ordered colostomy supplies and nursing to assist as needed              Chronic pain syndrome             -- Holding Cymbalta in the event is contributing to mania-patient has tolerated being off of Cymbalta             -- Continue  Neurontin  with breakfast, 800 mg with lunch and supper             -- Flexeril  bid PRN spasm             -- Continue Mobic 7.5mg  bid with food   Hyperlipidemia/Hypertriglyceridemia  -- Repeat fasting labs 04/09/21: Cholesterol 219, triglycerides 159, HDL 52, LDL 135  -- Will encourage dietary changes, increased exercise and repeat lab monitoring with PCP as an outpatient  -- Low albumin and low protein - will offer Ensure   Based on Collateral from Mother obtained by SW, there is concern for possible recent TBI  -- Cancelled Head CT since he already had CT scan 12/21 which showed normal appearance of the brain with non-acute right nasal arch and medial left orbit fractures which have occurred since 2015  -- Would benefit from Neurology f/u after discharge   4. Discharge Planning:              -- Social work and case management to assist with discharge planning and identification of hospital follow-up needs prior to discharge             -- Estimated LOS: 2-3 days morning             -- Discharge Concerns: Need to establish a safety plan; Medication compliance and effectiveness             -- Discharge Goals: Return home with outpatient referrals for mental health follow-up including medication management/psychotherapy  I certify that inpatient services furnished can reasonably be expected to improve the patient's condition.     Mariel Craft, MD 04/14/2021, 12:08 PM
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
## 2021-04-14 NOTE — Progress Notes (Signed)
   04/14/21 2117  Psych Admission Type (Psych Patients Only)  Admission Status Involuntary  Psychosocial Assessment  Patient Complaints Anxiety  Eye Contact Fair  Facial Expression Anxious  Affect Anxious;Preoccupied  Speech Tangential  Interaction Assertive  Motor Activity Slow  Appearance/Hygiene Improved  Behavior Characteristics Cooperative  Mood Preoccupied  Thought Process  Coherency Disorganized;Tangential  Content Obsessions;Preoccupation;Delusions  Delusions Paranoid  Perception WDL  Hallucination None reported or observed  Judgment Poor  Confusion Mild  Danger to Self  Current suicidal ideation? Denies  Danger to Others  Danger to Others None reported or observed  [name] [name] continues to isolate to his room.  He will come out for group and snacks.  He denies SI/HI or A/V hallucinations.  He reported head ache and generalized body aches. Tylenol was given with minimal effect, however he is asleep at this time.  Q 15 minute checks maintained for safety.  We will continue to monitor the progress towards his goals.

## 2021-04-14 NOTE — Progress Notes (Signed)
Pt visible on the phone some this evening. Pt stated he was doing ok this evening    04/14/21 0000  Psych Admission Type (Psych Patients Only)  Admission Status Involuntary  Psychosocial Assessment  Patient Complaints Depression  Eye Contact Fair  Facial Expression Anxious  Affect Anxious;Preoccupied  Speech Tangential  Interaction Assertive  Motor Activity Slow  Appearance/Hygiene Improved  Behavior Characteristics Cooperative  Mood Anxious  Thought Process  Coherency Disorganized;Tangential  Content Obsessions;Preoccupation;Delusions  Delusions Paranoid  Perception WDL  Hallucination None reported or observed  Judgment Poor  Confusion Mild  Danger to Self  Current suicidal ideation? Denies  Danger to Others  Danger to Others None reported or observed

## 2021-04-15 DIAGNOSIS — I10 Essential (primary) hypertension: Secondary | ICD-10-CM | POA: Diagnosis not present

## 2021-04-15 DIAGNOSIS — G894 Chronic pain syndrome: Secondary | ICD-10-CM

## 2021-04-15 DIAGNOSIS — Z933 Colostomy status: Secondary | ICD-10-CM | POA: Diagnosis not present

## 2021-04-15 DIAGNOSIS — Z720 Tobacco use: Secondary | ICD-10-CM

## 2021-04-15 DIAGNOSIS — F312 Bipolar disorder, current episode manic severe with psychotic features: Secondary | ICD-10-CM | POA: Diagnosis not present

## 2021-04-15 DIAGNOSIS — F200 Paranoid schizophrenia: Secondary | ICD-10-CM | POA: Diagnosis not present

## 2021-04-15 NOTE — Progress Notes (Addendum)
   04/15/21 2125  Psych Admission Type (Psych Patients Only)  Admission Status Involuntary  Psychosocial Assessment  Patient Complaints None  Eye Contact Fair  Facial Expression Anxious  Affect Appropriate to circumstance  Speech Tangential  Interaction Assertive  Motor Activity Slow  Appearance/Hygiene Improved  Behavior Characteristics Cooperative  Mood Preoccupied;Pleasant  Thought Process  Coherency Tangential  Content Obsessions;Preoccupation;Delusions  Delusions Grandeur  Perception WDL  Hallucination None reported or observed  Judgment Limited  Confusion None  Danger to Self  Current suicidal ideation? Denies  Danger to Others  Danger to Others None reported or observed   Pt denies SI, HI, AVH. Pt rates pain 8/10 (chronic pain generalized). Pt given PRNs for pain control. Pt takes meds as prescribed.

## 2021-04-15 NOTE — Progress Notes (Signed)
Recreation Therapy Notes  Date: 4.20.22 Time: 1000 Location: 500 Hall Dayroom  Group Topic: Leisure Education  Goal Area(s) Addresses:  Patient will identify positive leisure and recreation activities.  Patient will identify one positive benefit of participation in leisure activities.   Behavioral Response: Engaged  Intervention: Group Game  Activity: Leisure IT trainer. Patients took turns drawing a picture on the white board while the rest of the group tried to guess what the picture was.  The person drawing could not talk or write words on the board.  Patients got one minute to guess the drawing, the person who gets the correct answer gets the next turn.  Education:  Teacher, English as a foreign language, Special educational needs teacher, Discharge Planning  Education Outcome: Acknowledges education/In group clarification offered/Needs additional education  Clinical Observations/Feedback: Pt came a little late to group.  Once pt was told the instructions, he was able to join right in with the activity.  Pt was pleasant but would talk to himself when drawing on the board.  Pt seemed to be talking himself through the drawing to make sure he did it right.     Caroll Rancher, LRT/CTRS    Caroll Rancher A 04/15/2021 11:53 AM

## 2021-04-15 NOTE — Progress Notes (Signed)
Pt observed in his room majority of this shift. Visible in hall and interactive with peers during medication pass and meal times. Complaint with medications when offered without adverse drug reactions. However, pt remains delusional, grandiose in nature. Pt believes "My family takes care of all the presidents and their family including Regan and the latest ones. That's why I need my paper work corrected to discharge so I can leave. I need to go back to work". Tolerates his medications and meals well. Changed his colostomy bag and shower this shift. Q 15 minutes safety checks maintained without outburst and self harm gestures. Pt denies SI, HI, AVH and pain at this time. All medications given as ordered and effects monitored. Support and encouragement provided to pt this shift

## 2021-04-15 NOTE — BHH Group Notes (Signed)
LCSW Group Therapy Note   04/15/2021 1:15pm   Type of Therapy and Topic:  Group Therapy:  Positive Affirmations   Participation Level:  Minimal  Description of Group: This group addressed positive affirmation toward self and others. Patients went around the room and identified two positive things about themselves and two positive things about a peer in the room. Patients reflected on how it felt to share something positive with others, to identify positive things about themselves, and to hear positive things from others. Patients were encouraged to have a daily reflection of positive characteristics or circumstances.  Therapeutic Goals 1. Patient will verbalize two of their positive qualities 2. Patient will demonstrate empathy for others by stating two positive qualities about a peer in the group 3. Patient will verbalize their feelings when voicing positive self affirmations and when voicing positive affirmations of others 4. Patients will discuss the potential positive impact on their wellness/recovery of focusing on positive traits of self and others.   Summary of Patient Progress: Patient attended and participated in group. Patient was observed to be interacting with peers.     Therapeutic Modalities Cognitive Behavioral Therapy Motivational Interviewing  Otelia Santee, LCSW 04/15/2021 1:32 PM

## 2021-04-15 NOTE — Progress Notes (Signed)
Adult Psychoeducational Group Note  Date:  04/15/2021 Time:  8:12 PM  Group Topic/Focus:  Wrap-Up Group:   The focus of this group is to help patients review their daily goal of treatment and discuss progress on daily workbooks.  Participation Level:  Active  Participation Quality:  Appropriate  Affect:  Anxious  Cognitive:  Disorganized  Insight: Limited  Engagement in Group:  Limited  Modes of Intervention:  Discussion  Additional Comments:   Pt stated his goal for today was to focus on his treatment plan and to talk with his doctor about his discharge.  Pt stated he accomplished his goals today. Pt stated the plan is for him to discharge by Friday.  Pt stated hetalked with his doctor and his social worker about his care today. Pt rated his overall day an 9 out of 10.Pt stated he was able to contact his mother, sister, aunt today which improved his overall day. Pt stated hefelt better about himself tonight. Pt stated he attend all meals held today. Pt stated he attend all groups held today.Pt stated he took all medications provided today. Pt stated his appetite wasgoodtoday. Pt rated sleep last night wasgood. Pt stated the goal tonight was to get some rest. Pt stated hewas in severe  physical pain tonight. Pt stated he had muscle pain all over his body. Pt rated the muscle pain all over his body an 8 out of 10. Pt nurse was updated on situation. Pt denied auditory and visual hallucinationstonight.Pt deny thoughts of harming himself or others tonight.Pt stated he would alert staff if anything changed.   [name] [name] 04/15/2021, 8:12 PM

## 2021-04-15 NOTE — [facility] Group Notes (Signed)
[facility] Group Notes:  (Nursing/MHT/Case Management/Adjunct)  Date:  04/15/2021  Time:  10:15 AM  Type of Therapy:  Group Therapy  Participation Level:  Active  Participation Quality:  Appropriate  Affect:  Appropriate  Cognitive:  Alert and Appropriate  Insight:  Appropriate and Good  Engagement in Group:  Engaged  Modes of Intervention:  Orientation  Summary of Progress/Problems: To be able to get discharged so that he can go back home and love his life.   Keauna Brasel J Bennette Hasty 04/15/2021, 10:15 AM

## 2021-04-15 NOTE — Progress Notes (Signed)
[facility] MD Progress Note  04/15/2021 1:43 PM [name] [name]  MRN:  [id number]   Chief complaint: mania with psychosis  Subjective: [name] [name] reports, "I'm doing great. I think I ready to be discharged. I'm doing well on my medicines. I'm sleeping well. I've received my medicines to help me feel better because I'm   Observation: [name] [name] is a 55 y.o. male with a history of bipolar I d/o, who was initially admitted for inpatient psychiatric hospitalization on 04/07/2021 for management of acute mania with psychosis. The patient is currently on Hospital Day 8.  Chart Review from last 24 hours:  The patient's chart was reviewed and nursing notes were reviewed. The patient's case was discussed in multidisciplinary team meeting. Per nursing, there were no behavioral issues or safety concerns noted. He remained delusional but cooperative. Per MAR, he was compliant with scheduled medications. He received Vistaril for anxiety. [name] [name] is seen and evaluated in his room.  Patient is calm and cooperative.  He states he slept well, and has been enjoying the food at the hospital.  He reports that he is feeling a lot "better". He says he thinks he is ready to be discharged as he feels he has rested well, started back on his medications & doing well on them. He denies any new issues or concern. He denies any SIHI, AVH, delusional thoughts or paranoia. He does not appear to be responding to any internal stimuli. He denies any side effects. He has asked to be discharged as he is ready to go home. No changes made on his current plan of care. Will continue as already in progress.  Principal Problem: Bipolar I disorder, current or most recent episode manic, with psychotic features (HCC) Diagnosis: Principal Problem:   Bipolar I disorder, current or most recent episode manic, with psychotic features (HCC) Active Problems:   Colostomy in place [facility])   Chronic pain syndrome   Tobacco abuse   BP (high blood pressure)  Total Time  Spent in Direct Patient Care:  I personally spent 35 minutes on the unit in direct patient care. The direct patient care time included face-to-face time with the patient, reviewing the patient's chart, communicating with other professionals, and coordinating care. Greater than 50% of this time was spent in counseling or coordinating care with the patient regarding goals of hospitalization, psycho-education, and discharge planning needs.  Past Psychiatric History: see admission H&P  Past Medical History:  Past Medical History:  Diagnosis Date  . Anxiety   . Bipolar 1 disorder (HCC)   . Chronic fatigue   . Chronic pain   . Complete intestinal obstruction (HCC)   . Depression    Phreesia 12/03/2020  . Fecal peritonitis (HCC) 08/03/2019  . Fibromyalgia   . Hyperlipidemia    Phreesia 12/03/2020  . Ileus, postoperative (HCC)   . Perforated rectum (HCC) 08/03/2019    Past Surgical History:  Procedure Laterality Date  . COLON RESECTION SIGMOID  08/03/2019   Procedure: COLON RESECTION SIGMOID;  Surgeon: Lucretia Roers, MD;  Location: [facility] OR[facility];  Service: General;;  . COLOSTOMY N/A 08/03/2019   Procedure: COLOSTOMY;  Surgeon: Lucretia Roers, MD;  Location: [facility] ORS;  Service: General;  Laterality: N/A;  . FLEXIBLE SIGMOIDOSCOPY N/A 08/03/2019   Procedure: FLEXIBLE SIGMOIDOSCOPY;  Surgeon: Corbin Ade, MD;  Location: [facility] ENDO SUITE;  Service: Endoscopy;  Laterality: N/A;  . KNEE ARTHROSCOPY Left 2006   miniscus tear  . LAPAROTOMY N/A 08/03/2019   Procedure: EXPLORATORY LAPAROTOMY;  Surgeon: Lucretia Roers, MD;  Location: [facility] ORS;  Service: General;  Laterality: N/A;  . LYMPH GLAND EXCISION Left 1983  . SMALL INTESTINE SURGERY N/A    Phreesia 12/03/2020  . TONSILLECTOMY     Family History:  Family History  Problem Relation Age of Onset  . Diabetes Mother   . Heart attack Father   . Diabetes Maternal Grandmother   . Colon cancer Neg Hx   . Colon polyps Neg Hx    Family Psychiatric   History: see admission H&P  Social History:  Social History   Substance and Sexual Activity  Alcohol Use Not Currently   Comment: occasional; denied 10/02/19     Social History   Substance and Sexual Activity  Drug Use Not Currently  . Types: Cocaine   Comment: last used in December 2020.     Social History   Socioeconomic History  . Marital status: Legally Separated    Spouse name: Not on file  . Number of children: Not on file  . Years of education: Not on file  . Highest education level: Not on file  Occupational History  . Not on file  Tobacco Use  . Smoking status: Current Every Day Smoker    Packs/day: 1.00    Years: 10.00    Pack years: 10.00  . Smokeless tobacco: Never Used  Vaping Use  . Vaping Use: Some days  Substance and Sexual Activity  . Alcohol use: Not Currently    Comment: occasional; denied 10/02/19  . Drug use: Not Currently    Types: Cocaine    Comment: last used in December 2020.   Marland Kitchen Sexual activity: Not Currently  Other Topics Concern  . Not on file  Social History Narrative   Pt lives alone and is on disability. Counts his mother, sister and aunt as his social supports.    Social Determinants of Health   Financial Resource Strain: Not on file  Food Insecurity: Not on file  Transportation Needs: Not on file  Physical Activity: Not on file  Stress: Not on file  Social Connections: Not on file   Sleep: Good  Appetite:  Good  Current Medications: Current Facility-Administered Medications  Medication Dose Route Frequency Provider Last Rate Last Admin  . acetaminophen (TYLENOL) tablet 650 mg  650 mg Oral Q6H PRN Zaia Carre, Uchenna E, PA   650 mg at 04/14/21 2006  . alum & mag hydroxide-simeth (MAALOX/MYLANTA) 200-200-20 MG/5ML suspension 30 mL  30 mL Oral Q4H PRN Aubry Tucholski, Uchenna E, PA      . carbamazepine (TEGRETOL) chewable tablet 200 mg  200 mg Oral BID WC Mariel Craft, MD   200 mg at 04/15/21 4193  . cyclobenzaprine (FLEXERIL) tablet  10 mg  10 mg Oral BID PRN Willow Reczek, Uchenna E, PA   10 mg at 04/14/21 2117  . feeding supplement (ENSURE ENLIVE / ENSURE PLUS) liquid 237 mL  237 mL Oral BID BM Mason Jim, Amy E, MD   237 mL at 04/15/21 0955  . gabapentin (NEURONTIN) capsule 400 mg  400 mg Oral Q breakfast Mariel Craft, MD   400 mg at 04/15/21 7902  . gabapentin (NEURONTIN) capsule 800 mg  800 mg Oral BID AC Mariel Craft, MD   800 mg at 04/15/21 1300  . hydrOXYzine (ATARAX/VISTARIL) tablet 25 mg  25 mg Oral TID PRN Zira Helinski, Uchenna E, PA   25 mg at 04/14/21 2117  . magnesium hydroxide (MILK OF MAGNESIA) suspension 30 mL  30  mL Oral Daily PRN Greenleigh Kauth, Uchenna E, PA      . meloxicam (MOBIC) tablet 7.5 mg  7.5 mg Oral BID Mason JimSingleton, Amy E, MD   7.5 mg at 04/15/21 0953  . nicotine (NICODERM CQ - dosed in mg/24 hours) patch 21 mg  21 mg Transdermal Daily Tereka Thorley, Uchenna E, PA   21 mg at 04/15/21 0953  . ondansetron (ZOFRAN) tablet 4 mg  4 mg Oral Q8H PRN Dvaughn Fickle, Uchenna E, PA   4 mg at 04/12/21 1724  . risperiDONE (RISPERDAL) tablet 3 mg  3 mg Oral BID Mariel CraftMaurer, Sheila M, MD   3 mg at 04/15/21 0954  . traZODone (DESYREL) tablet 100 mg  100 mg Oral QHS Bartholomew CrewsSingleton, Amy E, MD   100 mg at 04/14/21 2117   Lab Results:  No results found for this or any previous visit (from the past 48 hour(s)).  Blood Alcohol level:  Lab Results  Component Value Date   ETH <10 04/06/2021   ETH <10 07/17/2020   Metabolic Disorder Labs: Lab Results  Component Value Date   HGBA1C 5.4 04/07/2021   MPG 108.28 04/07/2021   No results found for: PROLACTIN Lab Results  Component Value Date   CHOL 219 (H) 04/09/2021   TRIG 159 (H) 04/09/2021   HDL 52 04/09/2021   CHOLHDL 4.2 04/09/2021   VLDL 32 04/09/2021   LDLCALC 135 (H) 04/09/2021   LDLCALC 103 (H) 04/07/2021   Physical Findings: AIMS: Facial and Oral Movements Muscles of Facial Expression: None, normal Lips and Perioral Area: None, normal Jaw: None, normal Tongue: None, normal,Extremity  Movements Upper (arms, wrists, hands, fingers): None, normal Lower (legs, knees, ankles, toes): None, normal, Trunk Movements Neck, shoulders, hips: None, normal, Overall Severity Severity of abnormal movements (highest score from questions above): None, normal Incapacitation due to abnormal movements: None, normal Patient's awareness of abnormal movements (rate only patient's report): No Awareness, Dental Status Current problems with teeth and/or dentures?: No Does patient usually wear dentures?: No     Musculoskeletal: Strength & Muscle Tone: within normal limits Gait & Station: Antalgic gait Patient leans: N/A  Psychiatric Specialty Exam: Physical Exam Vitals and nursing note reviewed.  Constitutional:      General: He is not in acute distress.    Appearance: Normal appearance.  HENT:     Head: Normocephalic.     Right Ear: External ear normal.     Left Ear: External ear normal.     Nose: Nose normal.  Eyes:     Extraocular Movements: Extraocular movements intact.  Cardiovascular:     Rate and Rhythm: Normal rate.  Pulmonary:     Effort: Pulmonary effort is normal.  Genitourinary:    Comments: Deferred Musculoskeletal:        General: Normal range of motion.     Cervical back: Normal range of motion.     Comments: Stiff gait with a limp  Skin:    General: Skin is warm and dry.  Neurological:     General: No focal deficit present.     Mental Status: He is alert and oriented to person, place, and time.     Review of Systems  Constitutional: Negative.  Negative for chills, diaphoresis and fever.  HENT: Negative for congestion, rhinorrhea, sneezing and sore throat.   Eyes: Negative for discharge.  Respiratory: Negative for cough, shortness of breath and wheezing.   Cardiovascular: Negative for chest pain and palpitations.  Gastrointestinal: Negative for constipation, diarrhea, nausea and vomiting.  Endocrine: Negative for cold intolerance.  Genitourinary: Negative  for difficulty urinating.  Musculoskeletal: Positive for arthralgias, gait problem and myalgias.  Allergic/Immunologic:       Allergies: See the allergy lists.  Neurological: Negative for dizziness, tremors, seizures, syncope, facial asymmetry, speech difficulty, weakness, light-headedness, numbness and headaches.  Psychiatric/Behavioral: Positive for dysphoric mood. Negative for agitation, behavioral problems, confusion, decreased concentration, hallucinations, self-injury, sleep disturbance and suicidal ideas. The patient is nervous/anxious. The patient is not hyperactive.     Blood pressure 123/77, pulse 76, temperature 98 F (36.7 C), temperature source Oral, resp. rate 20, height 5\' 11"  (1.803 m), weight 91.2 kg, SpO2 98 %.Body mass index is 28.03 kg/m.  General Appearance: Good hygiene, appears stated age  Eye Contact:  Good  Speech: normal fluency  Volume:  Normal  Mood: Calm, euthymic  Affect: Congruent with some anxiety  Thought Process:  Circumstantial   Orientation:  Oriented to person, place, time, situation  Thought Content:  Denies AVH, ideas of reference, first rank symptoms, or paranoia; no delusional statements today  Suicidal Thoughts:  No  Homicidal Thoughts:  No  Memory: Good  Judgement: Fair  Insight: Fair  Psychomotor Activity: Normal - no tremors or restlessness  Concentration: Improving  Recall:  of Knowledge:  Fair  Language:  Fair  Akathisia:  Negative  Assets:  Communication Skills Desire for Improvement Resilience Social Support  ADL's:  independent  Cognition:  Improving  Sleep:  Number of Hours: 7   Treatment Plan Summary: Diagnoses / Active Problems: Bipolar I MRE manic severe with psychotic features H/o perforated rectum s/p partial colectomy and colostomy  PLAN: 1. Safety and Monitoring:             -- Involuntary admission to inpatient psychiatric unit for safety, stabilization and treatment - 2nd opinion completed              -- Daily contact with patient to assess and evaluate symptoms and progress in treatment             -- Patient's case to be discussed in multi-disciplinary team meeting             -- Observation Level : q15 minute checks             -- Vital signs:  q12 hours             -- Precautions: suicide  2. Psychiatric Diagnoses and Treatment:  Bipolar I MRE manic severe with psychotic features  -- Continue Tegretol 200mg  bid (04/13/21: Tegretol level 8.1, WBC 8.3, H/H 13.3/38.6, platelets 173; AST 22, ALT 23)             --Change to Risperdal tablet 3mg  bid with breakfast and at bedtime for residual delusions/mania              --Change Neurontin to 400 mg with breakfast, 800 mg with lunch and supper to help with pain and mood stabilization             --discontinue Klonopin 0.25mg  to qd for antimanic properties of medication and continue to monitor (goal of weaning off prior to discharge)             -- Continue Trazodone 100mg  qhs for sleep             -- Has PRN Vistaril 25mg  tid for anxiety             -- Agitation protocol - Ativan 1mg   po or IM q6 hours PRN and Haldol  po or IM q6 hours PRN             -- Metabolic profile and EKG monitoring obtained while on an atypical antipsychotic (BMI: 28.03; Lipid Panel:cholesterol 212, triglycerides 307, HDL 48, LDL 103; HbgA1c:5.4; QTc:443ms and on repeat )              -- Encouraged patient to participate in unit milieu and in scheduled group therapies              -- Short Term Goals: Ability to identify changes in lifestyle to reduce recurrence of condition will improve, Ability to demonstrate self-control will improve and Compliance with prescribed medications will improve             -- Long Term Goals: Improvement in symptoms so as ready for discharge              3. Medical Issues Being Addressed:              Tobacco Use Disorder             -- Nicotine patch /24 hours ordered             -- Smoking cessation encouraged               H/o perforated rectum s/p partial colectomy and colostomy             -- Ordered colostomy supplies and nursing to assist as needed              Chronic pain syndrome             -- Holding Cymbalta in the event is contributing to mania-patient has tolerated being off of Cymbalta             -- Continue Neurontin  with breakfast, 800 mg with lunch and supper             -- Flexeril  bid PRN spasm             -- Continue Mobic 7.5mg  bid with food   Hyperlipidemia/Hypertriglyceridemia  -- Repeat fasting labs 04/09/21: Cholesterol 219, triglycerides 159, HDL 52, LDL 135  -- Will encourage dietary changes, increased exercise and repeat lab monitoring with PCP as an outpatient  -- Low albumin and low protein - will offer Ensure   Based on Collateral from Mother obtained by SW, there is concern for possible recent TBI  -- Cancelled Head CT since he already had CT scan 12/21 which showed normal appearance of the brain with non-acute right nasal arch and medial left orbit fractures which have occurred since 2015  -- Would benefit from Neurology f/u after discharge   4. Discharge Planning:              -- Social work and case management to assist with discharge planning and identification of hospital follow-up needs prior to discharge             -- Estimated LOS: 2-3 days morning             -- Discharge Concerns: Need to establish a safety plan; Medication compliance and effectiveness             -- Discharge Goals: Return home with outpatient referrals for mental health follow-up including medication management/psychotherapy  I certify that inpatient services furnished can reasonably be expected to improve the patient's condition.    Armandina Stammer,  NP, PMHNP, FNP-BC 04/15/2021, 1:43 PM           Patient ID: [name] [name], male   DOB: [birth year], 55 y.o.   MRN: [id number]

## 2021-04-16 NOTE — Progress Notes (Signed)
Recreation Therapy Notes  Date: 4.21.22 Time: 1000 Location: 500 Hall Dayroom   Group Topic: Self Esteem    Goal Area(s) Addresses:  Patient will appropriately identify what self esteem is.  Patient will create a shield of armor describing themselves.  Patient will successfully identify positive attributes about themselves.    Behavioral Response: Engaged  Intervention / Activity: Self-Esteem Shield. Patient attended a recreation therapy group session focused on self esteem. Patient identified what self esteem is, and why it is important to have high self esteem during group discussion. LRT described to patients that their shield was to reflect the things that make them unique.  Patients could highlight accomplishments, people/pets that are important to them, things they have yet to accomplish, etc.   Patients were provided sheets with the shield printed on them and colored pencils, markers and crayons to complete the activity. LRT also played music for patients as they worked on their shields.  Education: Self esteem, Communication, Positive self-talk, Discharge Planning   Education Outcome: Acknowledges education/Verbalizes understanding of Education/In group clarification offered/Needs further education   Comments: Pt described his cat as being important to him.  Pt also stated his cat passed away and how he misses him.  Pt also put some kind of communication device on his shield, solar cell, pizza and music.  Pt was bright and social with peers and expressed enjoying the activity.    Caroll Rancher, LRT/CTRS    Caroll Rancher A 04/16/2021 11:04 AM

## 2021-04-16 NOTE — Progress Notes (Addendum)
[facility] MD Progress Note  04/16/2021 1:33 PM [name] [name]  MRN:  [id number]    Subjective: Patient states that he is ready to go home.  Patient reports that he is doing better on his current medications.  He is unable to state the reasons for his admission or how he is better than he was on admission.  Objective: Medical record reviewed.  Patient's case was discussed in detail with members of the treatment team.  I met with and evaluated the patient in the office on the unit today.  Patient is unkempt with fair relatedness and intermittent eye contact.  Speech is rambling and mildly pressured with normal volume.  Mood is anxious.  Affect is odd.  Thought processes are tangential and disorganized.  The patient denies any SI, AI, HI, PI.  He rambles in disconnected illogical fashion about multiple topics, including misplacing his medications prior to admission, moving houses, the miles on his odometer being different and time being different, [place], legal matters, etc.  He has difficulty answering direct questions about why he is in the hospital or what medications he took outside the hospital.  He requires frequent redirection to return to topic and often cannot be redirected to address topic at all.  Patient denies perceptual abnormalities.  He does not appear to respond to internal stimuli.  He is alert but distractible.  Insight and judgment are impaired.  Per chart review and staff report, the patient is taking standing dose medications as prescribed. He took PRN hydroxyzine last night for anxiety and took PRN Flexeril and acetaminophen for muscle spasms and pain.  Nursing staff reported that patient was making grandiose delusional statements last evening.  Patient has not engaged in any self-injurious behavior or any aggressive behavior on the unit.  Time spent with patient: 20 minutes  Principal Problem: Bipolar I disorder, current or most recent episode manic, with psychotic features ([place]) Diagnosis:  Principal Problem:   Bipolar I disorder, current or most recent episode manic, with psychotic features ([place]) Active Problems:   Colostomy in place [facility])   Chronic pain syndrome   Tobacco abuse   BP (high blood pressure)   Past Psychiatric History: see admission H&P  Past Medical History:  Past Medical History:  Diagnosis Date  . Anxiety   . Bipolar 1 disorder ([place])   . Chronic fatigue   . Chronic pain   . Complete intestinal obstruction ([place])   . Depression    Phreesia 12/03/2020  . Fecal peritonitis ([place]) 08/03/2019  . Fibromyalgia   . Hyperlipidemia    Phreesia 12/03/2020  . Ileus, postoperative ([place])   . Perforated rectum ([place]) 08/03/2019    Past Surgical History:  Procedure Laterality Date  . COLON RESECTION SIGMOID  08/03/2019   Procedure: COLON RESECTION SIGMOID;  Surgeon: Virl Cagey, MD;  Location: [facility] OR[facility];  Service: General;;  . COLOSTOMY N/A 08/03/2019   Procedure: COLOSTOMY;  Surgeon: Virl Cagey, MD;  Location: [facility] ORS;  Service: General;  Laterality: N/A;  . FLEXIBLE SIGMOIDOSCOPY N/A 08/03/2019   Procedure: FLEXIBLE SIGMOIDOSCOPY;  Surgeon: Daneil Dolin, MD;  Location: [facility] ENDO SUITE;  Service: Endoscopy;  Laterality: N/A;  . KNEE ARTHROSCOPY Left 2006   miniscus tear  . LAPAROTOMY N/A 08/03/2019   Procedure: EXPLORATORY LAPAROTOMY;  Surgeon: Virl Cagey, MD;  Location: [facility] OR[facility];  Service: General;  Laterality: N/A;  . LYMPH GLAND EXCISION Left 1983  . SMALL INTESTINE SURGERY N/A    Phreesia 12/03/2020  .  TONSILLECTOMY     Family History:  Family History  Problem Relation Age of Onset  . Diabetes Mother   . Heart attack Father   . Diabetes Maternal Grandmother   . Colon cancer Neg Hx   . Colon polyps Neg Hx    Family Psychiatric  History: see admission H&P  Social History:  Social History   Substance and Sexual Activity  Alcohol Use Not Currently   Comment: occasional; denied 10/02/19     Social History   Substance and Sexual  Activity  Drug Use Not Currently  . Types: Cocaine   Comment: last used in December 2020.     Social History   Socioeconomic History  . Marital status: Legally Separated    Spouse name: Not on file  . Number of children: Not on file  . Years of education: Not on file  . Highest education level: Not on file  Occupational History  . Not on file  Tobacco Use  . Smoking status: Current Every Day Smoker    Packs/day: 1.00    Years: 10.00    Pack years: 10.00  . Smokeless tobacco: Never Used  Vaping Use  . Vaping Use: Some days  Substance and Sexual Activity  . Alcohol use: Not Currently    Comment: occasional; denied 10/02/19  . Drug use: Not Currently    Types: Cocaine    Comment: last used in December 2020.   Marland Kitchen Sexual activity: Not Currently  Other Topics Concern  . Not on file  Social History Narrative   Pt lives alone and is on disability. Counts his mother, sister and aunt as his social supports.    Social Determinants of Health   Financial Resource Strain: Not on file  Food Insecurity: Not on file  Transportation Needs: Not on file  Physical Activity: Not on file  Stress: Not on file  Social Connections: Not on file   Sleep: Good  Appetite:  Good  Current Medications: Current Facility-Administered Medications  Medication Dose Route Frequency Provider Last Rate Last Admin  . acetaminophen (TYLENOL) tablet 650 mg  650 mg Oral Q6H PRN Nwoko, Uchenna E, PA   650 mg at 04/15/21 2051  . alum & mag hydroxide-simeth (MAALOX/MYLANTA) 200-200-20 MG/5ML suspension 30 mL  30 mL Oral Q4H PRN Nwoko, Uchenna E, PA      . carbamazepine (TEGRETOL) chewable tablet 200 mg  200 mg Oral BID WC Lavella Hammock, MD   200 mg at 04/16/21 0753  . cyclobenzaprine (FLEXERIL) tablet 10 mg  10 mg Oral BID PRN Nwoko, Uchenna E, PA   10 mg at 04/15/21 2052  . feeding supplement (ENSURE ENLIVE / ENSURE PLUS) liquid 237 mL  237 mL Oral BID BM Nelda Marseille, Amy E, MD   237 mL at 04/16/21 1121  .  gabapentin (NEURONTIN) capsule 400 mg  400 mg Oral Q breakfast Lavella Hammock, MD   400 mg at 04/16/21 0754  . gabapentin (NEURONTIN) capsule 800 mg  800 mg Oral BID AC Lavella Hammock, MD   800 mg at 04/16/21 1122  . hydrOXYzine (ATARAX/VISTARIL) tablet 25 mg  25 mg Oral TID PRN Ileene Musa E, PA   25 mg at 04/15/21 2052  . magnesium hydroxide (MILK OF MAGNESIA) suspension 30 mL  30 mL Oral Daily PRN Nwoko, Uchenna E, PA      . meloxicam (MOBIC) tablet 7.5 mg  7.5 mg Oral BID Nelda Marseille, Amy E, MD   7.5 mg at 04/16/21 0754  .  nicotine (NICODERM CQ - dosed in mg/24 hours) patch 21 mg  21 mg Transdermal Daily Nwoko, Uchenna E, PA   21 mg at 04/16/21 0753  . ondansetron (ZOFRAN) tablet 4 mg  4 mg Oral Q8H PRN Nwoko, Uchenna E, PA   4 mg at 04/12/21 1724  . risperiDONE (RISPERDAL) tablet 3 mg  3 mg Oral BID Lavella Hammock, MD   3 mg at 04/16/21 0754  . traZODone (DESYREL) tablet 100 mg  100 mg Oral QHS Viann Fish E, MD   100 mg at 04/15/21 2052   Lab Results:  No results found for this or any previous visit (from the past 48 hour(s)).  Blood Alcohol level:  Lab Results  Component Value Date   ETH <10 04/06/2021   ETH <10 93/81/0175   Metabolic Disorder Labs: Lab Results  Component Value Date   HGBA1C 5.4 04/07/2021   MPG 108.28 04/07/2021   No results found for: PROLACTIN Lab Results  Component Value Date   CHOL 219 (H) 04/09/2021   TRIG 159 (H) 04/09/2021   HDL 52 04/09/2021   CHOLHDL 4.2 04/09/2021   VLDL 32 04/09/2021   LDLCALC 135 (H) 04/09/2021   LDLCALC 103 (H) 04/07/2021   Physical Findings: AIMS: Facial and Oral Movements Muscles of Facial Expression: None, normal Lips and Perioral Area: None, normal Jaw: None, normal Tongue: None, normal,Extremity Movements Upper (arms, wrists, hands, fingers): None, normal Lower (legs, knees, ankles, toes): None, normal, Trunk Movements Neck, shoulders, hips: None, normal, Overall Severity Severity of abnormal movements  (highest score from questions above): None, normal Incapacitation due to abnormal movements: None, normal Patient's awareness of abnormal movements (rate only patient's report): No Awareness, Dental Status Current problems with teeth and/or dentures?: No Does patient usually wear dentures?: No     Musculoskeletal: Strength & Muscle Tone: within normal limits Gait & Station: Antalgic gait Patient leans: N/A  Psychiatric Specialty Exam: Physical Exam Vitals and nursing note reviewed.  Constitutional:      General: He is not in acute distress. HENT:     Head: Normocephalic.  Genitourinary:    Comments: Deferred Musculoskeletal:     Comments: Stiff gait with a limp  Neurological:     General: No focal deficit present.     Mental Status: He is alert and oriented to person, place, and time.     Review of Systems  Constitutional: Negative.  Negative for chills, diaphoresis and fever.  HENT: Negative for congestion, rhinorrhea, sneezing and sore throat.   Eyes: Negative for discharge.  Respiratory: Negative for cough, shortness of breath and wheezing.   Cardiovascular: Negative for chest pain and palpitations.  Gastrointestinal: Negative for constipation, diarrhea, nausea and vomiting.  Endocrine: Negative for cold intolerance.  Genitourinary: Negative for difficulty urinating.  Musculoskeletal: Positive for arthralgias, gait problem and myalgias.  Allergic/Immunologic:       Allergies: See the allergy lists.  Neurological: Negative for dizziness, tremors, seizures, syncope, facial asymmetry, speech difficulty, weakness, light-headedness, numbness and headaches.  Psychiatric/Behavioral: Positive for dysphoric mood. Negative for agitation, behavioral problems, confusion, decreased concentration, hallucinations, self-injury, sleep disturbance and suicidal ideas. The patient is nervous/anxious. The patient is not hyperactive.     Blood pressure 129/66, pulse 88, temperature (!) 97.5 F  (36.4 C), temperature source Oral, resp. rate 20, height '5\' 11"'  (1.803 m), weight 91.2 kg, SpO2 97 %.Body mass index is 28.03 kg/m.  General Appearance: Unkempt, appears stated age  Eye Contact: Fair  Speech: Mildly increased rate, somewhat  pressured  Volume:  Normal  Mood: Anxious  Affect: Congruent    Thought Process: Disorganized, tangential   Orientation:  Oriented to person, place, situation  Thought Content:  Denies AVH.  Illogical & poorly related ideas expressed  Suicidal Thoughts:  No  Homicidal Thoughts:  No  Memory: Fair  Judgement: Limited  Insight: Limited  Psychomotor Activity: Normal - no tremors or restlessness  Concentration: Limited  Recall:  [employer] of Knowledge:  Fair  Language:  Fair  Akathisia:  Negative  Assets:   Desire for Improvement Resilience Social Support  ADL's:  independent  Cognition:  Improving  Sleep:  Number of Hours: 8.25   Treatment Plan Summary: Diagnoses / Active Problems: Bipolar I MRE manic severe with psychotic features H/o perforated rectum s/p partial colectomy and colostomy  PLAN: 1. Safety and Monitoring:             -- Continue IVC status             -- Daily contact with patient to assess and evaluate symptoms and progress in treatment             -- Patient's case to be discussed in multi-disciplinary team meeting             -- Observation Level : q15 minute checks             -- Vital signs:  q12 hours             -- Precautions: suicide  2. Psychiatric Diagnoses and Treatment:  Bipolar I MRE manic severe with psychotic features  -- Continue Tegretol 262m bid (04/13/21: Tegretol level 8.1, WBC 8.3, H/H 13.3/38.6, platelets 173; AST 22, ALT 23)             --Continue Risperdal tablet 365mbid with breakfast and at bedtime for residual delusions/mania              --Continue Neurontin to 400 mg with breakfast, 800 mg with lunch and supper to help with pain and mood stabilization             --discontinued Klonopin  0.2564mo qd for antimanic properties of medication and continue to monitor (goal of weaning off prior to discharge)             -- Continue Trazodone 100m1ms for sleep             -- Has PRN Vistaril 25mg84m for anxiety             -- Agitation protocol - Ativan 1mg p31mr IM q6 hours PRN and Haldol 5mg po11m IM q6 hours PRN             -- Metabolic profile and EKG monitoring obtained while on an atypical antipsychotic (BMI: 28.03; Lipid Panel:cholesterol 212, triglycerides 307, HDL 48, LDL 103; HbgA1c:5.4; QTc:412ms an31m repeat 425ms)   63m       -- Encouraged patient to participate in unit milieu and in scheduled group therapies              -- Short Term Goals: Ability to identify changes in lifestyle to reduce recurrence of condition will improve, Ability to demonstrate self-control will improve and Compliance with prescribed medications will improve             -- Long Term Goals: Improvement in symptoms so as ready for discharge  3. Medical Issues Being Addressed:              Tobacco Use Disorder             -- Nicotine patch 73m/24 hours ordered             -- Smoking cessation encouraged              H/o perforated rectum s/p partial colectomy and colostomy             -- Ordered colostomy supplies and nursing to assist as needed              Chronic pain syndrome             -- Holding Cymbalta in the event is contributing to mania-patient has tolerated being off of Cymbalta             -- Continue Neurontin 4073mwith breakfast, 800 mg with lunch and supper             -- Flexeril 1070mid PRN spasm             -- Continue Mobic 7.5mg59md with food   Hyperlipidemia/Hypertriglyceridemia  -- Repeat fasting labs 04/09/21: Cholesterol 219, triglycerides 159, HDL 52, LDL 135  -- Will encourage dietary changes, increased exercise and repeat lab monitoring with PCP as an outpatient  -- Low albumin and low protein - will offer Ensure   Based on Collateral from  Mother obtained by SW, there is concern for possible recent TBI  -- Cancelled Head CT since he already had CT scan 12/21 which showed normal appearance of the brain with non-acute right nasal arch and medial left orbit fractures which have occurred since 2015  -- Would benefit from Neurology f/u after discharge   4. Discharge Planning:              -- Social work and case management to assist with discharge planning and identification of hospital follow-up needs prior to discharge             -- Estimated LOS: 2-3 days morning             -- Discharge Concerns: Need to establish a safety plan; Medication compliance and effectiveness             -- Discharge Goals: Return home with outpatient referrals for mental health follow-up including medication management/psychotherapy  I certify that inpatient services furnished can reasonably be expected to improve the patient's condition.    MartArthor Captain,   04/16/2021, 1:33 PM           Patient ID: MarkPollyann Savoyle   DOB: 01/25/1966-10-25 y[birth year].   MRN: [id number]

## 2021-04-16 NOTE — Progress Notes (Addendum)
   04/16/21 2030  Psych Admission Type (Psych Patients Only)  Admission Status Involuntary  Psychosocial Assessment  Patient Complaints None  Eye Contact Fair  Facial Expression Anxious  Affect Appropriate to circumstance  Speech Tangential  Interaction Assertive  Motor Activity Slow  Appearance/Hygiene Improved  Behavior Characteristics Cooperative  Mood Pleasant  Thought Process  Coherency Tangential  Content Obsessions;Preoccupation;Delusions  Delusions Grandeur  Perception WDL  Hallucination None reported or observed  Judgment Limited  Confusion None  Danger to Self  Current suicidal ideation? Denies  Danger to Others  Danger to Others None reported or observed   Pt seen in dayroom. Pt says he feels good. Denies SI, HI, AVH. Rates pain 7/10. "It's funny. My pain started up again about 5 o'clock this evening. I guess this morning after the pain medicine it was even down to a zero. But, it's up to a 7 now." Pt given PRNs for pain management along with his nighttime medications.

## 2021-04-16 NOTE — [facility] Group Notes (Signed)
Occupational Therapy Group Note Date: 04/16/2021 Group Topic/Focus: Brain Fitness  Group Description: Group encouraged increased social engagement and participation through discussion/activity focused on brain fitness. Patients were provided education on various brain fitness activities/strategies, with explanation provided on the qualifying factors including: one, that is has to be challenging/hard and two, it has to be something that you do not do every day. Patients engaged actively during group session in various brain fitness activities to increase attention, concentration, and problem-solving skills. Discussion followed with a focus on identifying the benefits of brain fitness activities as use for adaptive coping strategies and distraction.    Therapeutic Goal(s): Identify benefit(s) of brain fitness activities as use for adaptive coping and healthy distraction. Identify specific brain fitness activities to engage in as use for adaptive coping and healthy distraction. Participation Level: Active   Participation Quality: Independent   Behavior: Hyperverbal   Speech/Thought Process: Disorganized   Affect/Mood: Full range   Insight: Impaired   Judgement: Impaired   Individualization: Daison was active in their participation of group discussion/activity, however presented as disorganized and tangential in his speech. Pt actively engaged in activity and offered several relevant responses and contributions, however was noted to have difficulty finding his words at times.   Modes of Intervention: Activity, Discussion, Education and Problem-solving  Patient Response to Interventions:  Engaged and Receptive   Plan: Continue to engage patient in OT groups 2 - 3x/week.  04/16/2021  Donne Hazel, MOT, OTR/L

## 2021-04-16 NOTE — [facility] Group Notes (Signed)
[facility] Group Notes:  (Nursing/MHT/Case Management/Adjunct)  Date:  04/16/2021  Time:  10:22 AM  Type of Therapy:  Group Therapy  Participation Level:  Active  Participation Quality:  Appropriate  Affect:  Appropriate  Cognitive:  Alert and Appropriate  Insight:  Appropriate and Good  Engagement in Group:  Engaged  Modes of Intervention:  Orientation  Summary of Progress/Problems: His goal for today is to be able to go home.   [name] [name] [name] [name] 04/16/2021, 10:22 AM

## 2021-04-16 NOTE — Progress Notes (Signed)
   04/16/21 1000  Psych Admission Type (Psych Patients Only)  Admission Status Involuntary  Psychosocial Assessment  Patient Complaints None  Eye Contact Fair  Facial Expression Anxious  Affect Appropriate to circumstance  Speech Tangential  Interaction Assertive  Motor Activity Slow  Appearance/Hygiene Improved  Behavior Characteristics Cooperative  Mood Pleasant  Thought Process  Coherency Tangential  Content Obsessions;Preoccupation;Delusions  Delusions Grandeur  Perception WDL  Hallucination None reported or observed  Judgment Limited  Confusion None  Danger to Self  Current suicidal ideation? Denies  Danger to Others  Danger to Others None reported or observed

## 2021-04-16 NOTE — Progress Notes (Signed)
Adult Psychoeducational Group Note  Date:  04/16/2021 Time:  8:48 PM  Group Topic/Focus:  Wrap-Up Group:   The focus of this group is to help patients review their daily goal of treatment and discuss progress on daily workbooks.  Participation Level:  Active  Participation Quality:  Appropriate  Affect:  Anxious  Cognitive:  Appropriate  Insight: Appropriate  Engagement in Group:  Limited  Modes of Intervention:  Discussion  Additional Comments:  Pt stated his goal for today was to focus on his treatment planand to talk with his doctor about his blood pressure issues.Pt stated he accomplished his goalstoday.  Pt stated hetalked with his doctorand hissocial worker about his care today. Pt rated his overall day a 10.Pt stated he was able to contact hismother, sister today whichimproved his overall day. Pt stated hefelt better about himself tonight. Pt stated he attend all meals held today. Pt stated he attend all groups held today.Pt stated he took all medications provided today. Pt stated his appetite wasgoodtoday. Pt rated sleep last night wasgood. Pt stated the goal tonight was to get some rest. Pt stated hewas inseverephysical pain tonight. Pt stated he had muscle pain all over his body. Pt rated the muscle pain all over his body an 7 out of 10. Pt nurse was updated on situation.Pt denied auditory and visual hallucinationstonight.Pt deny thoughts of harming himself or others tonight.Pt stated he would alert staff if anything changed  Felipa Furnace 04/16/2021, 8:48 PM

## 2021-04-17 NOTE — Tx Team (Signed)
Interdisciplinary Treatment and Diagnostic Plan Update  04/17/2021 Time of Session: 10:42am [name] MRN: [id number]  Principal Diagnosis: Bipolar I disorder, current or most recent episode manic, with psychotic features (HCC)  Secondary Diagnoses: Principal Problem:   Bipolar I disorder, current or most recent episode manic, with psychotic features (HCC) Active Problems:   Colostomy in place [facility])   Chronic pain syndrome   Tobacco abuse   BP (high blood pressure)   Current Medications:  Current Facility-Administered Medications  Medication Dose Route Frequency Provider Last Rate Last Admin  . acetaminophen (TYLENOL) tablet 650 mg  650 mg Oral Q6H PRN Nwoko, Uchenna E, PA   650 mg at 04/17/21 0817  . alum & mag hydroxide-simeth (MAALOX/MYLANTA) 200-200-20 MG/5ML suspension 30 mL  30 mL Oral Q4H PRN Nwoko, Uchenna E, PA      . carbamazepine (TEGRETOL) chewable tablet 200 mg  200 mg Oral BID WC Mariel Craft, MD   200 mg at 04/17/21 0809  . cyclobenzaprine (FLEXERIL) tablet 10 mg  10 mg Oral BID PRN Nwoko, Uchenna E, PA   10 mg at 04/16/21 2040  . gabapentin (NEURONTIN) capsule 400 mg  400 mg Oral Q breakfast Mariel Craft, MD   400 mg at 04/17/21 0809  . gabapentin (NEURONTIN) capsule 800 mg  800 mg Oral BID AC Mariel Craft, MD   800 mg at 04/16/21 1705  . hydrOXYzine (ATARAX/VISTARIL) tablet 25 mg  25 mg Oral TID PRN Nwoko, Uchenna E, PA   25 mg at 04/16/21 2040  . magnesium hydroxide (MILK OF MAGNESIA) suspension 30 mL  30 mL Oral Daily PRN Nwoko, Uchenna E, PA      . meloxicam (MOBIC) tablet 7.5 mg  7.5 mg Oral BID Mason Jim, Amy E, MD   7.5 mg at 04/17/21 0815  . nicotine (NICODERM CQ - dosed in mg/24 hours) patch 21 mg  21 mg Transdermal Daily Nwoko, Uchenna E, PA   21 mg at 04/17/21 0811  . ondansetron (ZOFRAN) tablet 4 mg  4 mg Oral Q8H PRN Nwoko, Uchenna E, PA   4 mg at 04/12/21 1724  . risperiDONE (RISPERDAL) tablet 3 mg  3 mg Oral BID Mariel Craft, MD   3 mg  at 04/17/21 0810  . traZODone (DESYREL) tablet 100 mg  100 mg Oral QHS Mason Jim, Amy E, MD   100 mg at 04/16/21 2040   PTA Medications: Medications Prior to Admission  Medication Sig Dispense Refill Last Dose  . clonazePAM (KLONOPIN) 2 MG tablet Take 2 mg by mouth 3 (three) times daily as needed for anxiety. (Patient not taking: Reported on 04/06/2021)     . cyclobenzaprine (FLEXERIL) 10 MG tablet Take 1 tablet (10 mg total) by mouth 2 (two) times daily as needed for muscle spasms. 60 tablet 1   . DULoxetine (CYMBALTA) 60 MG capsule Take 1 capsule (60 mg total) by mouth daily. 60 capsule 5   . gabapentin (NEURONTIN) 400 MG capsule TAKE (1) CAPSULE BY MOUTH FOUR TIMES DAILY. 120 capsule 0   . meloxicam (MOBIC) 7.5 MG tablet Take 1 tablet (7.5 mg total) by mouth 2 (two) times daily. 60 tablet 2   . ondansetron (ZOFRAN-ODT) 4 MG disintegrating tablet Take 1 tablet (4 mg total) by mouth every 8 (eight) hours as needed for nausea or vomiting. (Patient not taking: Reported on 04/06/2021) 20 tablet 0   . zolpidem (AMBIEN) 10 MG tablet Take 10 mg by mouth daily as needed. (Patient not taking:  Reported on 04/06/2021)       Patient Stressors: Medication change or noncompliance Other: delusions  Patient Strengths: Average or above average intelligence Capable of independent living Supportive family/friends  Treatment Modalities: Medication Management, Group therapy, Case management,  1 to 1 session with clinician, Psychoeducation, Recreational therapy.   Physician Treatment Plan for Primary Diagnosis: Bipolar I disorder, current or most recent episode manic, with psychotic features (HCC) Long Term Goal(s): Improvement in symptoms so as ready for discharge   Short Term Goals: Ability to identify changes in lifestyle to reduce recurrence of condition will improve Ability to demonstrate self-control will improve Compliance with prescribed medications will improve  Medication Management: Evaluate  patient's response, side effects, and tolerance of medication regimen.  Therapeutic Interventions: 1 to 1 sessions, Unit Group sessions and Medication administration.  Evaluation of Outcomes: Progressing  Physician Treatment Plan for Secondary Diagnosis: Principal Problem:   Bipolar I disorder, current or most recent episode manic, with psychotic features (HCC) Active Problems:   Colostomy in place [facility])   Chronic pain syndrome   Tobacco abuse   BP (high blood pressure)  Long Term Goal(s): Improvement in symptoms so as ready for discharge   Short Term Goals: Ability to identify changes in lifestyle to reduce recurrence of condition will improve Ability to demonstrate self-control will improve Compliance with prescribed medications will improve     Medication Management: Evaluate patient's response, side effects, and tolerance of medication regimen.  Therapeutic Interventions: 1 to 1 sessions, Unit Group sessions and Medication administration.  Evaluation of Outcomes: Progressing   RN Treatment Plan for Primary Diagnosis: Bipolar I disorder, current or most recent episode manic, with psychotic features (HCC) Long Term Goal(s): Knowledge of disease and therapeutic regimen to maintain health will improve  Short Term Goals: Ability to verbalize frustration and anger appropriately will improve, Ability to demonstrate self-control and Ability to participate in decision making will improve  Medication Management: RN will administer medications as ordered by provider, will assess and evaluate patient's response and provide education to patient for prescribed medication. RN will report any adverse and/or side effects to prescribing provider.  Therapeutic Interventions: 1 on 1 counseling sessions, Psychoeducation, Medication administration, Evaluate responses to treatment, Monitor vital signs and CBGs as ordered, Perform/monitor CIWA, COWS, AIMS and Fall Risk screenings as ordered, Perform  wound care treatments as ordered.  Evaluation of Outcomes: Progressing   LCSW Treatment Plan for Primary Diagnosis: Bipolar I disorder, current or most recent episode manic, with psychotic features (HCC) Long Term Goal(s): Safe transition to appropriate next level of care at discharge, Engage patient in therapeutic group addressing interpersonal concerns.  Short Term Goals: Engage patient in aftercare planning with referrals and resources, Increase social support and Increase ability to appropriately verbalize feelings  Therapeutic Interventions: Assess for all discharge needs, 1 to 1 time with Social worker, Explore available resources and support systems, Assess for adequacy in community support network, Educate family and significant other(s) on suicide prevention, Complete Psychosocial Assessment, Interpersonal group therapy.  Evaluation of Outcomes: Progressing   Progress in Treatment: Attending groups: Yes. Participating in groups: Yes. Taking medication as prescribed: Yes. Toleration medication: Yes. Family/Significant other contact made: Yes, individual(s) contacted:  mother Patient understands diagnosis: Yes. Discussing patient identified problems/goals with staff: Yes. Medical problems stabilized or resolved: Yes. Denies suicidal/homicidal ideation: Yes. Issues/concerns per patient self-inventory: No. Other: None  New problem(s) identified: No, Describe:  None  New Short Term/Long Term Goal(s):medication stabilization, elimination of SI thoughts, development of comprehensive mental  wellness plan.  Patient Goals:  "try and sleep."  Discharge Plan or Barriers: Patient is to return to apartment at discharge. Patient is to follow up with [facility] outpatient in [place] and [place] for medication management and therapy.    Reason for Continuation of Hospitalization: Delusions  Mania Medication stabilization  Estimated Length of Stay: 3-5 days  Attendees: Patient:  [name] [name] 04/08/2021   Physician: Clifton Custard, MD 04/08/2021   Nursing:  04/08/2021   RN Care Manager: 04/08/2021   Social Worker: Ruthann Cancer, LCSW 04/08/2021   Recreational Therapist:  04/08/2021   Other:  04/08/2021   Other:  04/08/2021   Other: 04/08/2021       Scribe for Treatment Team: Felizardo Hoffmann, LCSWA 04/17/2021 11:29 AM

## 2021-04-17 NOTE — [facility] Group Notes (Signed)
[facility] Group Notes:  (Nursing/MHT/Case Management/Adjunct)  Date:  04/17/2021  Time:  9:50 AM  Type of Therapy:  Group Therapy  Participation Level:  Active  Participation Quality:  Appropriate  Affect:  Appropriate and Not Congruent  Cognitive:  Alert and Disorganized  Insight:  Appropriate and Improving  Engagement in Group:  Developing/Improving  Modes of Intervention:  Orientation  Summary of Progress/Problems: His goal is to go back home because he has too much work at home to be here.   Kristin Lamagna J Laurene Melendrez 04/17/2021, 9:50 AM

## 2021-04-17 NOTE — [facility] Group Notes (Signed)
[place] LCSW Group Therapy  04/17/2021 11:59 AM  Type of Therapy:  Group Therapy: Strengths Exploration   Participation Level:  None  Summary of Progress/Problems: Patient did not attend group, however met with CSW after group to go over worksheets individually. Patient was able to share that his strengths are artistic abilities, enthusiasm, open mindedness, and social awareness.   Batu Cassin A Judd Mccubbin 04/17/2021, 11:59 AM

## 2021-04-17 NOTE — Progress Notes (Signed)
[name] MD Progress Note  04/17/2021 1:41 PM CABLE FEARN  MRN:  [id number]    Subjective: [name] [name] reports, "I'm fine. I'm ready to be discharged. All charges with the [facility] has been dismissed. But, the 54 missing girls has not be found yet, so that cannot be my fault. I'm ready to be discharged".  Objective: [name] is seen, chart reviewed. He is seen his room. He presents alert, oriented & friendly. However, today, he is very talkative, making exaggerated tangential statements. He presents hypomanic, delusional about 54 missing girls that has not been found as of yet. He is fixated on the IVC court dates he says is scheduled on 04-21-21 when all the charges with the [place] have already be dismissed. He is taking & tolerating his treatment regimen. Denies any side effects. We will continue with current plan of care.  Per previous report: Medical record reviewed.  Patient's case was discussed in detail with members of the treatment team.  I met with and evaluated the patient in the office on the unit today.  Patient is unkempt with fair relatedness and intermittent eye contact.  Speech is rambling and mildly pressured with normal volume.  Mood is anxious.  Affect is odd.  Thought processes are tangential and disorganized.  The patient denies any SI, AI, HI, PI.  He rambles in disconnected illogical fashion about multiple topics, including misplacing his medications prior to admission, moving houses, the miles on his odometer being different and time being different, [place], legal matters, etc.  He has difficulty answering direct questions about why he is in the hospital or what medications he took outside the hospital.  He requires frequent redirection to return to topic and often cannot be redirected to address topic at all.  Patient denies perceptual abnormalities.  He does not appear to respond to internal stimuli.  He is alert but distractible.  Insight and  judgment are impaired.  Per chart review and staff report, the patient is taking standing dose medications as prescribed. He took PRN hydroxyzine last night for anxiety and took PRN Flexeril and acetaminophen for muscle spasms and pain.  Nursing staff reported that patient was making grandiose delusional statements last evening.  Patient has not engaged in any self-injurious behavior or any aggressive behavior on the unit.  Time spent with patient: 20 minutes  Principal Problem: Bipolar I disorder, current or most recent episode manic, with psychotic features ([place]) Diagnosis: Principal Problem:   Bipolar I disorder, current or most recent episode manic, with psychotic features ([place]) Active Problems:   Colostomy in place [facility])   Chronic pain syndrome   Tobacco abuse   BP (high blood pressure)   Past Psychiatric History: see admission H&P  Past Medical History:  Past Medical History:  Diagnosis Date  . Anxiety   . Bipolar 1 disorder ([place])   . Chronic fatigue   . Chronic pain   . Complete intestinal obstruction ([place])   . Depression    Phreesia 12/03/2020  . Fecal peritonitis ([place]) 08/03/2019  . Fibromyalgia   . Hyperlipidemia    Phreesia 12/03/2020  . Ileus, postoperative ([place])   . Perforated rectum ([place]) 08/03/2019    Past Surgical History:  Procedure Laterality Date  . COLON RESECTION SIGMOID  08/03/2019   Procedure: COLON RESECTION SIGMOID;  Surgeon: Virl Cagey, MD;  Location: [facility] ORS;  Service: General;;  . COLOSTOMY N/A 08/03/2019   Procedure: COLOSTOMY;  Surgeon: Virl Cagey, MD;  Location: [facility] ORS;  Service: General;  Laterality: N/A;  . FLEXIBLE SIGMOIDOSCOPY N/A 08/03/2019   Procedure: FLEXIBLE SIGMOIDOSCOPY;  Surgeon: Daneil Dolin, MD;  Location: [facility] ENDO SUITE;  Service: Endoscopy;  Laterality: N/A;  . KNEE ARTHROSCOPY Left 2006   miniscus tear  . LAPAROTOMY N/A 08/03/2019   Procedure: EXPLORATORY LAPAROTOMY;  Surgeon: Virl Cagey, MD;  Location: [facility]  ORS;  Service: General;  Laterality: N/A;  . LYMPH GLAND EXCISION Left 1983  . SMALL INTESTINE SURGERY N/A    Phreesia 12/03/2020  . TONSILLECTOMY     Family History:  Family History  Problem Relation Age of Onset  . Diabetes Mother   . Heart attack Father   . Diabetes Maternal Grandmother   . Colon cancer Neg Hx   . Colon polyps Neg Hx    Family Psychiatric  History: see admission H&P  Social History:  Social History   Substance and Sexual Activity  Alcohol Use Not Currently   Comment: occasional; denied 10/02/19     Social History   Substance and Sexual Activity  Drug Use Not Currently  . Types: Cocaine   Comment: last used in December 2020.     Social History   Socioeconomic History  . Marital status: Legally Separated    Spouse name: Not on file  . Number of children: Not on file  . Years of education: Not on file  . Highest education level: Not on file  Occupational History  . Not on file  Tobacco Use  . Smoking status: Current Every Day Smoker    Packs/day: 1.00    Years: 10.00    Pack years: 10.00  . Smokeless tobacco: Never Used  Vaping Use  . Vaping Use: Some days  Substance and Sexual Activity  . Alcohol use: Not Currently    Comment: occasional; denied 10/02/19  . Drug use: Not Currently    Types: Cocaine    Comment: last used in December 2020.   Marland Kitchen Sexual activity: Not Currently  Other Topics Concern  . Not on file  Social History Narrative   Pt lives alone and is on disability. Counts his mother, sister and aunt as his social supports.    Social Determinants of Health   Financial Resource Strain: Not on file  Food Insecurity: Not on file  Transportation Needs: Not on file  Physical Activity: Not on file  Stress: Not on file  Social Connections: Not on file   Sleep: Good  Appetite:  Good  Current Medications: Current Facility-Administered Medications  Medication Dose Route Frequency Provider Last Rate Last Admin  . acetaminophen  (TYLENOL) tablet 650 mg  650 mg Oral Q6H PRN Maryse Brierley, Uchenna E, PA   650 mg at 04/17/21 0817  . alum & mag hydroxide-simeth (MAALOX/MYLANTA) 200-200-20 MG/5ML suspension 30 mL  30 mL Oral Q4H PRN Juline Sanderford, Uchenna E, PA      . carbamazepine (TEGRETOL) chewable tablet 200 mg  200 mg Oral BID WC Lavella Hammock, MD   200 mg at 04/17/21 0809  . cyclobenzaprine (FLEXERIL) tablet 10 mg  10 mg Oral BID PRN Rick Warnick, Uchenna E, PA   10 mg at 04/16/21 2040  . gabapentin (NEURONTIN) capsule 400 mg  400 mg Oral Q breakfast Lavella Hammock, MD   400 mg at 04/17/21 0809  . gabapentin (NEURONTIN) capsule 800 mg  800 mg Oral BID AC Lavella Hammock, MD   800 mg at 04/17/21 1259  . hydrOXYzine (ATARAX/VISTARIL) tablet 25 mg  25 mg Oral TID PRN Erol Flanagin, Uchenna E, PA   25 mg at 04/16/21 2040  . magnesium hydroxide (MILK OF MAGNESIA) suspension 30 mL  30 mL Oral Daily PRN Letisia Schwalb, Uchenna E, PA      . meloxicam (MOBIC) tablet 7.5 mg  7.5 mg Oral BID Nelda Marseille, Amy E, MD   7.5 mg at 04/17/21 0815  . nicotine (NICODERM CQ - dosed in mg/24 hours) patch 21 mg  21 mg Transdermal Daily Florabel Faulks, Uchenna E, PA   21 mg at 04/17/21 0811  . ondansetron (ZOFRAN) tablet 4 mg  4 mg Oral Q8H PRN Astraea Gaughran, Uchenna E, PA   4 mg at 04/12/21 1724  . risperiDONE (RISPERDAL) tablet 3 mg  3 mg Oral BID Lavella Hammock, MD   3 mg at 04/17/21 0810  . traZODone (DESYREL) tablet 100 mg  100 mg Oral QHS Nelda Marseille, Amy E, MD   100 mg at 04/16/21 2040   Lab Results:  No results found for this or any previous visit (from the past 48 hour(s)).  Blood Alcohol level:  Lab Results  Component Value Date   ETH <10 04/06/2021   ETH <10 81/19/1478   Metabolic Disorder Labs: Lab Results  Component Value Date   HGBA1C 5.4 04/07/2021   MPG 108.28 04/07/2021   No results found for: PROLACTIN Lab Results  Component Value Date   CHOL 219 (H) 04/09/2021   TRIG 159 (H) 04/09/2021   HDL 52 04/09/2021   CHOLHDL 4.2 04/09/2021   VLDL 32 04/09/2021   LDLCALC  135 (H) 04/09/2021   LDLCALC 103 (H) 04/07/2021   Physical Findings: AIMS: Facial and Oral Movements Muscles of Facial Expression: None, normal Lips and Perioral Area: None, normal Jaw: None, normal Tongue: None, normal,Extremity Movements Upper (arms, wrists, hands, fingers): None, normal Lower (legs, knees, ankles, toes): None, normal, Trunk Movements Neck, shoulders, hips: None, normal, Overall Severity Severity of abnormal movements (highest score from questions above): None, normal Incapacitation due to abnormal movements: None, normal Patient's awareness of abnormal movements (rate only patient's report): No Awareness, Dental Status Current problems with teeth and/or dentures?: No Does patient usually wear dentures?: No     Musculoskeletal: Strength & Muscle Tone: within normal limits Gait & Station: Antalgic gait Patient leans: N/A  Psychiatric Specialty Exam: Physical Exam Vitals and nursing note reviewed.  Constitutional:      General: He is not in acute distress. HENT:     Head: Normocephalic.     Nose: Nose normal.     Mouth/Throat:     Pharynx: Oropharynx is clear.  Eyes:     Pupils: Pupils are equal, round, and reactive to light.  Genitourinary:    Comments: Deferred.  Hx. Colostomy. Musculoskeletal:        General: Normal range of motion.     Comments: Stiff gait with a limp  Skin:    General: Skin is warm and dry.  Neurological:     General: No focal deficit present.     Mental Status: He is alert and oriented to person, place, and time.     Review of Systems  Constitutional: Negative.  Negative for chills, diaphoresis and fever.  HENT: Negative for congestion, rhinorrhea, sneezing and sore throat.   Eyes: Negative for discharge.  Respiratory: Negative for cough, shortness of breath and wheezing.   Cardiovascular: Negative for chest pain and palpitations.  Gastrointestinal: Negative for constipation, diarrhea, nausea and vomiting.  Endocrine:  Negative for cold intolerance.  Genitourinary: Negative  for difficulty urinating.  Musculoskeletal: Positive for arthralgias, gait problem and myalgias.  Allergic/Immunologic:       Allergies: See the allergy lists.  Neurological: Negative for dizziness, tremors, seizures, syncope, facial asymmetry, speech difficulty, weakness, light-headedness, numbness and headaches.  Psychiatric/Behavioral: Positive for dysphoric mood. Negative for agitation, behavioral problems, confusion, decreased concentration, hallucinations, self-injury, sleep disturbance and suicidal ideas. The patient is nervous/anxious. The patient is not hyperactive.     Blood pressure 117/66, pulse 77, temperature 97.8 F (36.6 C), temperature source Oral, resp. rate 20, height _0  (1.803 m), weight 91.2 kg, SpO2 98 %.Body mass index is 28.03 kg/m.  General Appearance: Unkempt, appears stated age  Eye Contact: Fair  Speech: Mildly increased rate, somewhat pressured  Volume:  Normal  Mood: Anxious  Affect: Congruent    Thought Process: Disorganized, tangential   Orientation:  Oriented to person, place, situation  Thought Content:  Denies AVH.  Illogical & poorly related ideas expressed  Suicidal Thoughts:  No  Homicidal Thoughts:  No  Memory: Fair  Judgement: Limited  Insight: Limited  Psychomotor Activity: Normal - no tremors or restlessness  Concentration: Limited  Recall:  [employer] of Knowledge:  Fair  Language:  Fair  Akathisia:  Negative  Assets:   Desire for Improvement Resilience Social Support  ADL's:  independent  Cognition:  Improving  Sleep:  Number of Hours: 6   Treatment Plan Summary: Diagnoses /Active Problems: Bipolar I MRE manic severe with psychotic features H/o perforated rectum s/p partial colectomy and colostomy  PLAN: 1. Safety and Monitoring:             -- Continue IVC status             -- Daily contact with patient to assess and evaluate symptoms and progress in treatment              -- Patient's case to be discussed in multi-disciplinary team meeting             -- Observation Level : q15 minute checks             -- Vital signs:  q12 hours             -- Precautions: suicide  2. Psychiatric Diagnoses and Treatment:  Bipolar I MRE manic severe with psychotic features  -- Continue Tegretol 249m bid (04/13/21: Tegretol level 8.1, WBC 8.3, H/H 13.3/38.6, platelets 173; AST 22, ALT 23)             --Continue Risperdal tablet 351mbid with breakfast and at bedtime for residual delusions/mania              --Continue Neurontin to 400 mg with breakfast, 800 mg with lunch and supper to help with pain and mood stabilization             --discontinued Klonopin 0.2562mo qd for antimanic properties of medication and continue to monitor (goal of weaning off prior to discharge)             -- Continue Trazodone 100m41ms for sleep             -- Has PRN Vistaril 25mg82m for anxiety             -- Agitation protocol - Ativan 1mg p16mr IM q6 hours PRN and Haldol 5mg po63m IM q6 hours PRN             -- Metabolic profile and  EKG monitoring obtained while on an atypical antipsychotic (BMI: 28.03; Lipid Panel:cholesterol 212, triglycerides 307, HDL 48, LDL 103; HbgA1c:5.4; QTc:43m and on repeat 4296m              -- Encouraged patient to participate in unit milieu and in scheduled group therapies              -- Short Term Goals: Ability to identify changes in lifestyle to reduce recurrence of condition will improve, Ability to demonstrate self-control will improve and Compliance with prescribed medications will improve             -- Long Term Goals: Improvement in symptoms so as ready for discharge              3. Medical Issues Being Addressed:              Tobacco Use Disorder             -- Nicotine patch 2174m4 hours ordered             -- Smoking cessation encouraged              H/o perforated rectum s/p partial colectomy and colostomy             -- Ordered  colostomy supplies and nursing to assist as needed              Chronic pain syndrome             -- Holding Cymbalta in the event is contributing to mania-patient has tolerated being off of Cymbalta             -- Continue Neurontin 400m57mth breakfast, 800 mg with lunch and supper             -- Flexeril 10mg95m PRN spasm             -- Continue Mobic 7.5mg b79mwith food   Hyperlipidemia/Hypertriglyceridemia  -- Repeat fasting labs 04/09/21: Cholesterol 219, triglycerides 159, HDL 52, LDL 135  -- Will encourage dietary changes, increased exercise and repeat lab monitoring with PCP as an outpatient  -- Low albumin and low protein - will offer Ensure   Based on Collateral from Mother obtained by SW, there is concern for possible recent TBI  -- Cancelled Head CT since he already had CT scan 12/21 which showed normal appearance of the brain with non-acute right nasal arch and medial left orbit fractures which have occurred since 2015  -- Would benefit from Neurology f/u after discharge   4. Discharge Planning:              -- Social work and case management to assist with discharge planning and identification of hospital follow-up needs prior to discharge             -- Estimated LOS: 2-3 days morning             -- Discharge Concerns: Need to establish a safety plan; Medication compliance and effectiveness             -- Discharge Goals: Return home with outpatient referrals for mental health follow-up including medication management/psychotherapy  I certify that inpatient services furnished can reasonably be expected to improve the patient's condition.    Brayon Bielefeld Lindell SparPMHNP, FNP-BC   04/17/2021, 1:41 PM Patient ID: Duy DTYRIQ MORAGNE   DOB: 02/03/1901/03/67.o37  MRN: [id number] ID: Giovany DPollyann Savoy  DOB: March 30, 1966, 55 y.o.   MRN: [id number]

## 2021-04-17 NOTE — Progress Notes (Addendum)
   04/17/21 2021  Psych Admission Type (Psych Patients Only)  Admission Status Involuntary  Psychosocial Assessment  Patient Complaints None  Eye Contact Fair  Facial Expression Animated  Affect Appropriate to circumstance  Speech Tangential  Interaction Assertive  Motor Activity Slow  Appearance/Hygiene Improved  Behavior Characteristics Cooperative  Mood Pleasant;Preoccupied  Thought Process  Coherency Tangential  Content Obsessions;Preoccupation;Delusions  Delusions Grandeur;Paranoid  Perception WDL  Hallucination None reported or observed  Judgment Limited  Confusion None  Danger to Self  Current suicidal ideation? Denies  Danger to Others  Danger to Others None reported or observed   Pt seen interacting in dayroom. Pt denies SI, HI, AVH. States pain is 10/10. "It started after dinner. My muscles just started hurting everywhere. I had the chicken for dinner but it couldn't be that." Pt also began talking about being experimented on in [place] and being placed in a tank of water. Also injections of various drugs. Pt is redirectable. Was anxious earlier about IVC paperwork. Further spoke with pt about this to reassure him everything is fine.

## 2021-04-17 NOTE — Progress Notes (Signed)
Recreation Therapy Notes  Date: 4.22.22 Time: 1000 Location: 500 Hall Dayroom  Group Topic: Communication, Team Building, Problem Solving   Goal Area(s) Addresses:  Patient will effectively work with peer towards shared goal.  Patient will identify skill used to make activity successful.  Patient will identify how skills used during activity can be used to reach post d/c goals.   Behavioral Response: Engaged  Intervention: Cup [employer] navy officer bands with attached strings enough for each group member, 10 or more cups  Activity: Patient(s) were given a set of solo cups, a rubber band, and some tied strings. The objective is to build a pyramid with the cups by only using the rubber band and string to move the cups. After the activity the patient(s) are LRT debriefed and discussed what strategies worked, what didn't, and what lessons they can take from the activity and use in life post discharge.   Education Areas: Social Skills, Support System, Discharge Planning   Education Outcome: Acknowledges education  Clinical Observations/Feedback: Patient was attentive to the ideas of his peers.  Pt also had moments were he would talk to himself but was still able to contribute to the group.  Pt was bright and engaged throughout activity.   Caroll Rancher, LRT/CTRS    Caroll Rancher A 04/17/2021 11:04 AM

## 2021-04-18 NOTE — Progress Notes (Signed)
[facility] MD Progress Note  04/18/2021 3:33 PM TYQUEZ HOLLIBAUGH  MRN:  [id number]    Subjective: Bradin reports, "My mood is perfect. I'm ready to be discharged".  Objective: Rainen is seen, chart reviewed. He is seen his room. He presents alert, oriented & friendly. He remains talkative, making exaggerated tangential statements. He is fixated on getting discharged from [facility]. He says he will be moving into his new house after discharge. No behavioral issues noted or reported by staff. He is taking & tolerating his treatment regimen. Denies any side effects. We will continue with current plan of care.  Per previous report: Medical record reviewed.  Patient's case was discussed in detail with members of the treatment team.  I met with and evaluated the patient in the office on the unit today.  Patient is unkempt with fair relatedness and intermittent eye contact.  Speech is rambling and mildly pressured with normal volume.  Mood is anxious.  Affect is odd.  Thought processes are tangential and disorganized.  The patient denies any SI, AI, HI, PI.  He rambles in disconnected illogical fashion about multiple topics, including misplacing his medications prior to admission, moving houses, the miles on his odometer being different and time being different, [place], legal matters, etc.  He has difficulty answering direct questions about why he is in the hospital or what medications he took outside the hospital.  He requires frequent redirection to return to topic and often cannot be redirected to address topic at all.  Patient denies perceptual abnormalities.  He does not appear to respond to internal stimuli.  He is alert but distractible.  Insight and judgment are impaired.  Per chart review and staff report, the patient is taking standing dose medications as prescribed. He took PRN hydroxyzine last night for anxiety and took PRN Flexeril and acetaminophen for muscle spasms and pain.  Nursing staff reported that patient was  making grandiose delusional statements last evening.  Patient has not engaged in any self-injurious behavior or any aggressive behavior on the unit.  Time spent with patient: 20 minutes  Principal Problem: Bipolar I disorder, current or most recent episode manic, with psychotic features ([place]) Diagnosis: Principal Problem:   Bipolar I disorder, current or most recent episode manic, with psychotic features ([place]) Active Problems:   Colostomy in place [facility])   Chronic pain syndrome   Tobacco abuse   BP (high blood pressure)   Past Psychiatric History: see admission H&P  Past Medical History:  Past Medical History:  Diagnosis Date  . Anxiety   . Bipolar 1 disorder ([place])   . Chronic fatigue   . Chronic pain   . Complete intestinal obstruction ([place])   . Depression    Phreesia 12/03/2020  . Fecal peritonitis ([place]) 08/03/2019  . Fibromyalgia   . Hyperlipidemia    Phreesia 12/03/2020  . Ileus, postoperative ([place])   . Perforated rectum ([place]) 08/03/2019    Past Surgical History:  Procedure Laterality Date  . COLON RESECTION SIGMOID  08/03/2019   Procedure: COLON RESECTION SIGMOID;  Surgeon: Virl Cagey, MD;  Location: [facility] ORS;  Service: General;;  . COLOSTOMY N/A 08/03/2019   Procedure: COLOSTOMY;  Surgeon: Virl Cagey, MD;  Location: [facility] ORS;  Service: General;  Laterality: N/A;  . FLEXIBLE SIGMOIDOSCOPY N/A 08/03/2019   Procedure: FLEXIBLE SIGMOIDOSCOPY;  Surgeon: Daneil Dolin, MD;  Location: [facility] ENDO SUITE;  Service: Endoscopy;  Laterality: N/A;  . KNEE ARTHROSCOPY Left 2006   miniscus tear  .  LAPAROTOMY N/A 08/03/2019   Procedure: EXPLORATORY LAPAROTOMY;  Surgeon: Virl Cagey, MD;  Location: [facility] ORS;  Service: General;  Laterality: N/A;  . LYMPH GLAND EXCISION Left 1983  . SMALL INTESTINE SURGERY N/A    Phreesia 12/03/2020  . TONSILLECTOMY     Family History:  Family History  Problem Relation Age of Onset  . Diabetes Mother   . Heart attack Father   . Diabetes  Maternal Grandmother   . Colon cancer Neg Hx   . Colon polyps Neg Hx    Family Psychiatric  History: see admission H&P  Social History:  Social History   Substance and Sexual Activity  Alcohol Use Not Currently   Comment: occasional; denied 10/02/19     Social History   Substance and Sexual Activity  Drug Use Not Currently  . Types: Cocaine   Comment: last used in December 2020.     Social History   Socioeconomic History  . Marital status: Legally Separated    Spouse name: Not on file  . Number of children: Not on file  . Years of education: Not on file  . Highest education level: Not on file  Occupational History  . Not on file  Tobacco Use  . Smoking status: Current Every Day Smoker    Packs/day: 1.00    Years: 10.00    Pack years: 10.00  . Smokeless tobacco: Never Used  Vaping Use  . Vaping Use: Some days  Substance and Sexual Activity  . Alcohol use: Not Currently    Comment: occasional; denied 10/02/19  . Drug use: Not Currently    Types: Cocaine    Comment: last used in December 2020.   Marland Kitchen Sexual activity: Not Currently  Other Topics Concern  . Not on file  Social History Narrative   Pt lives alone and is on disability. Counts his mother, sister and aunt as his social supports.    Social Determinants of Health   Financial Resource Strain: Not on file  Food Insecurity: Not on file  Transportation Needs: Not on file  Physical Activity: Not on file  Stress: Not on file  Social Connections: Not on file   Sleep: Good  Appetite:  Good  Current Medications: Current Facility-Administered Medications  Medication Dose Route Frequency Provider Last Rate Last Admin  . acetaminophen (TYLENOL) tablet 650 mg  650 mg Oral Q6H PRN Ramonia Mcclaran, Uchenna E, PA   650 mg at 04/17/21 2102  . alum & mag hydroxide-simeth (MAALOX/MYLANTA) 200-200-20 MG/5ML suspension 30 mL  30 mL Oral Q4H PRN Krystian Ferrentino, Uchenna E, PA      . carbamazepine (TEGRETOL) chewable tablet 200 mg  200 mg  Oral BID WC Lavella Hammock, MD   200 mg at 04/18/21 0714  . cyclobenzaprine (FLEXERIL) tablet 10 mg  10 mg Oral BID PRN Jalene Lacko, Uchenna E, PA   10 mg at 04/17/21 2101  . gabapentin (NEURONTIN) capsule 400 mg  400 mg Oral Q breakfast Lavella Hammock, MD   400 mg at 04/18/21 1610  . gabapentin (NEURONTIN) capsule 800 mg  800 mg Oral BID AC Lavella Hammock, MD   800 mg at 04/18/21 1238  . hydrOXYzine (ATARAX/VISTARIL) tablet 25 mg  25 mg Oral TID PRN Jedd Schulenburg, Uchenna E, PA   25 mg at 04/17/21 1612  . magnesium hydroxide (MILK OF MAGNESIA) suspension 30 mL  30 mL Oral Daily PRN Adreona Brand, Uchenna E, PA      . meloxicam (MOBIC) tablet 7.5 mg  7.5 mg Oral BID Nelda Marseille, Amy E, MD   7.5 mg at 04/18/21 0714  . nicotine (NICODERM CQ - dosed in mg/24 hours) patch 21 mg  21 mg Transdermal Daily Mohab Ashby, Uchenna E, PA   21 mg at 04/18/21 0715  . ondansetron (ZOFRAN) tablet 4 mg  4 mg Oral Q8H PRN Prithvi Kooi, Uchenna E, PA   4 mg at 04/12/21 1724  . risperiDONE (RISPERDAL) tablet 3 mg  3 mg Oral BID Lavella Hammock, MD   3 mg at 04/18/21 0715  . traZODone (DESYREL) tablet 100 mg  100 mg Oral QHS Viann Fish E, MD   100 mg at 04/17/21 2101   Lab Results:  No results found for this or any previous visit (from the past 48 hour(s)).  Blood Alcohol level:  Lab Results  Component Value Date   ETH <10 04/06/2021   ETH <10 23/76/2831   Metabolic Disorder Labs: Lab Results  Component Value Date   HGBA1C 5.4 04/07/2021   MPG 108.28 04/07/2021   No results found for: PROLACTIN Lab Results  Component Value Date   CHOL 219 (H) 04/09/2021   TRIG 159 (H) 04/09/2021   HDL 52 04/09/2021   CHOLHDL 4.2 04/09/2021   VLDL 32 04/09/2021   LDLCALC 135 (H) 04/09/2021   LDLCALC 103 (H) 04/07/2021   Physical Findings: AIMS: Facial and Oral Movements Muscles of Facial Expression: None, normal Lips and Perioral Area: None, normal Jaw: None, normal Tongue: None, normal,Extremity Movements Upper (arms, wrists, hands,  fingers): None, normal Lower (legs, knees, ankles, toes): None, normal, Trunk Movements Neck, shoulders, hips: None, normal, Overall Severity Severity of abnormal movements (highest score from questions above): None, normal Incapacitation due to abnormal movements: None, normal Patient's awareness of abnormal movements (rate only patient's report): No Awareness, Dental Status Current problems with teeth and/or dentures?: No Does patient usually wear dentures?: No     Musculoskeletal: Strength & Muscle Tone: within normal limits Gait & Station: Antalgic gait Patient leans: N/A  Psychiatric Specialty Exam: Physical Exam Vitals and nursing note reviewed.  Constitutional:      General: He is not in acute distress. HENT:     Head: Normocephalic.     Nose: Nose normal.     Mouth/Throat:     Pharynx: Oropharynx is clear.  Eyes:     Pupils: Pupils are equal, round, and reactive to light.  Genitourinary:    Comments: Deferred.  Hx. Colostomy. Musculoskeletal:        General: Normal range of motion.     Comments: Stiff gait with a limp  Skin:    General: Skin is warm and dry.  Neurological:     General: No focal deficit present.     Mental Status: He is alert and oriented to person, place, and time.     Review of Systems  Constitutional: Negative.  Negative for chills, diaphoresis and fever.  HENT: Negative for congestion, rhinorrhea, sneezing and sore throat.   Eyes: Negative for discharge.  Respiratory: Negative for cough, shortness of breath and wheezing.   Cardiovascular: Negative for chest pain and palpitations.  Gastrointestinal: Negative for constipation, diarrhea, nausea and vomiting.  Endocrine: Negative for cold intolerance.  Genitourinary: Negative for difficulty urinating.  Musculoskeletal: Positive for arthralgias, gait problem and myalgias.  Allergic/Immunologic:       Allergies: See the allergy lists.  Neurological: Negative for dizziness, tremors, seizures,  syncope, facial asymmetry, speech difficulty, weakness, light-headedness, numbness and headaches.  Psychiatric/Behavioral: Positive for dysphoric mood.  Negative for agitation, behavioral problems, confusion, decreased concentration, hallucinations, self-injury, sleep disturbance and suicidal ideas. The patient is nervous/anxious. The patient is not hyperactive.     Blood pressure 132/80, pulse 84, temperature 97.6 F (36.4 C), temperature source Oral, resp. rate 20, height '5\' 11"'  (1.803 m), weight 91.2 kg, SpO2 98 %.Body mass index is 28.03 kg/m.  General Appearance: Unkempt, appears stated age  Eye Contact: Fair  Speech: Mildly increased rate, somewhat pressured  Volume:  Normal  Mood: Anxious  Affect: Congruent    Thought Process: Disorganized, tangential   Orientation:  Oriented to person, place, situation  Thought Content:  Denies AVH.  Illogical & poorly related ideas expressed  Suicidal Thoughts:  No  Homicidal Thoughts:  No  Memory: Fair  Judgement: Limited  Insight: Limited  Psychomotor Activity: Normal - no tremors or restlessness  Concentration: Limited  Recall:  [employer] of Knowledge:  Fair  Language:  Fair  Akathisia:  Negative  Assets:   Desire for Improvement Resilience Social Support  ADL's:  independent  Cognition:  Improving  Sleep:  Number of Hours: 7.5   Treatment Plan Summary: Diagnoses /Active Problems: Bipolar I MRE manic severe with psychotic features H/o perforated rectum s/p partial colectomy and colostomy  PLAN: 1. Safety and Monitoring:             -- Continue IVC status             -- Daily contact with patient to assess and evaluate symptoms and progress in treatment             -- Patient's case to be discussed in multi-disciplinary team meeting             -- Observation Level : q15 minute checks             -- Vital signs:  q12 hours             -- Precautions: suicide  2. Psychiatric Diagnoses and Treatment:  Bipolar I MRE manic  severe with psychotic features  -- Continue Tegretol 227m bid (04/13/21: Tegretol level 8.1, WBC 8.3, H/H 13.3/38.6, platelets 173; AST 22, ALT 23)             --Continue Risperdal tablet 33mbid with breakfast and at bedtime for residual delusions/mania              --Continue Neurontin to 400 mg with breakfast, 800 mg with lunch and supper to help with pain and mood stabilization             --discontinued Klonopin 0.2564mo qd for antimanic properties of medication and continue to monitor (goal of weaning off prior to discharge)             -- Continue Trazodone 100m37ms for sleep             -- Has PRN Vistaril 25mg19m for anxiety             -- Agitation protocol - Ativan 1mg p60mr IM q6 hours PRN and Haldol 5mg po5m IM q6 hours PRN             -- Metabolic profile and EKG monitoring obtained while on an atypical antipsychotic (BMI: 28.03; Lipid Panel:cholesterol 212, triglycerides 307, HDL 48, LDL 103; HbgA1c:5.4; QTc:412ms an60m repeat 425ms)   36m       -- Encouraged patient to participate in unit milieu and in  scheduled group therapies              -- Short Term Goals: Ability to identify changes in lifestyle to reduce recurrence of condition will improve, Ability to demonstrate self-control will improve and Compliance with prescribed medications will improve             -- Long Term Goals: Improvement in symptoms so as ready for discharge              3. Medical Issues Being Addressed:              Tobacco Use Disorder             -- Nicotine patch 57m/24 hours ordered             -- Smoking cessation encouraged              H/o perforated rectum s/p partial colectomy and colostomy             -- Ordered colostomy supplies and nursing to assist as needed              Chronic pain syndrome             -- Holding Cymbalta in the event is contributing to mania-patient has tolerated being off of Cymbalta             -- Continue Neurontin 4070mwith breakfast, 800 mg with lunch  and supper             -- Flexeril 1026mid PRN spasm             -- Continue Mobic 7.5mg26md with food   Hyperlipidemia/Hypertriglyceridemia  -- Repeat fasting labs 04/09/21: Cholesterol 219, triglycerides 159, HDL 52, LDL 135  -- Will encourage dietary changes, increased exercise and repeat lab monitoring with PCP as an outpatient  -- Low albumin and low protein - will offer Ensure   Based on Collateral from Mother obtained by SW, there is concern for possible recent TBI  -- Cancelled Head CT since he already had CT scan 12/21 which showed normal appearance of the brain with non-acute right nasal arch and medial left orbit fractures which have occurred since 2015  -- Would benefit from Neurology f/u after discharge   4. Discharge Planning:              -- Social work and case management to assist with discharge planning and identification of hospital follow-up needs prior to discharge             -- Estimated LOS: 2-3 days morning             -- Discharge Concerns: Need to establish a safety plan; Medication compliance and effectiveness             -- Discharge Goals: Return home with outpatient referrals for mental health follow-up including medication management/psychotherapy  I certify that inpatient services furnished can reasonably be expected to improve the patient's condition.    AgneLindell Spar, PMHNP, FNP-BC   04/18/2021, 3:33 PM Patient ID: MarkCUSTER PIMENTAle   DOB: 12/20/1966/06/06 y74[birth year]   MRN: [id number] ID: MarkULYSESS WITZle   DOB: 02/03/01-14-1967 y61.   MRN: [id number] ID: MarkHONORIO DEVOLle   DOB: [birth year] y46[birth year]   MRN: [id number]

## 2021-04-18 NOTE — [facility] Group Notes (Signed)
  [facility] LCSW Group Therapy Note  Date/Time:  04/18/2021 11:15AM-12:00PM  Type of Therapy and Topic:  Group Therapy:  Feelings About Hospitalization  Participation Level:  Active   Description of Group This process group involved patients discussing their feelings related to being hospitalized, as well as the benefits they see to being in the hospital.  These feelings and benefits were itemized.  The group then brainstormed specific ways in which they could seek those same benefits when they discharge and return home.  Therapeutic Goals Patient will identify and describe positive and negative feelings related to hospitalization Patient will verbalize benefits of hospitalization to themselves personally Patients will brainstorm together ways they can obtain similar benefits in the outpatient setting, identify barriers to wellness and possible solutions  Summary of Patient Progress:  The patient expressed his primary feelings about being hospitalized are good because he "just came here for sleep."  He stated that prior to coming to the hospital he had not slept for 11 days.  His speech and thought processes were tangential in terms of staying on topic, but perseverating about the sleep issue.  He stated he feels he has accomplished his goal of getting sleep.  To stay well outside of the hospital, he intends to use various community organizations such as social services, [employer], and more.  These will help him with food, for instance.  He was pleasant throughout group, but did hold his own private conversation throughout the entire group while others were talking.  Therapeutic Modalities Cognitive Behavioral Therapy Motivational Interviewing    Ambrose Mantle, LCSW 04/18/2021, 11:52 AM

## 2021-04-18 NOTE — Progress Notes (Signed)
Pt continues to be delusional and grandiose at times. Pt has flight of ideas and rambling speech.  Pt given PRN Vistaril with HS medication   04/18/21 2000  Psych Admission Type (Psych Patients Only)  Admission Status Involuntary  Psychosocial Assessment  Patient Complaints None;Suspiciousness  Eye Contact Fair  Facial Expression Animated  Affect Appropriate to circumstance  Speech Tangential  Interaction Assertive  Motor Activity Slow  Appearance/Hygiene Improved  Behavior Characteristics Cooperative  Mood Suspicious  Thought Process  Coherency Tangential  Content Obsessions;Preoccupation;Delusions  Delusions Grandeur;Paranoid  Perception WDL  Hallucination None reported or observed  Judgment Limited  Confusion None  Danger to Self  Current suicidal ideation? Denies  Danger to Others  Danger to Others None reported or observed

## 2021-04-19 LAB — CBC WITH DIFFERENTIAL/PLATELET
Abs Immature Granulocytes: 0.04 10*3/uL (ref 0.00–0.07)
Basophils Absolute: 0 10*3/uL (ref 0.0–0.1)
Basophils Relative: 0 %
Eosinophils Absolute: 0.5 10*3/uL (ref 0.0–0.5)
Eosinophils Relative: 7 %
HCT: 37.4 % — ABNORMAL LOW (ref 39.0–52.0)
Hemoglobin: 13.1 g/dL (ref 13.0–17.0)
Immature Granulocytes: 1 %
Lymphocytes Relative: 22 %
Lymphs Abs: 1.8 10*3/uL (ref 0.7–4.0)
MCH: 30 pg (ref 26.0–34.0)
MCHC: 35 g/dL (ref 30.0–36.0)
MCV: 85.6 fL (ref 80.0–100.0)
Monocytes Absolute: 0.6 10*3/uL (ref 0.1–1.0)
Monocytes Relative: 7 %
Neutro Abs: 5.3 10*3/uL (ref 1.7–7.7)
Neutrophils Relative %: 63 %
Platelets: 176 10*3/uL (ref 150–400)
RBC: 4.37 MIL/uL (ref 4.22–5.81)
RDW: 12.8 % (ref 11.5–15.5)
WBC: 8.3 10*3/uL (ref 4.0–10.5)
nRBC: 0 % (ref 0.0–0.2)

## 2021-04-19 LAB — HEPATIC FUNCTION PANEL
ALT: 22 U/L (ref 0–44)
AST: 23 U/L (ref 15–41)
Albumin: 3.8 g/dL (ref 3.5–5.0)
Alkaline Phosphatase: 56 U/L (ref 38–126)
Bilirubin, Direct: <0.1 mg/dL (ref 0.0–0.2)
Total Bilirubin: 0.1 mg/dL — ABNORMAL LOW (ref 0.3–1.2)
Total Protein: 6.6 g/dL (ref 6.5–8.1)

## 2021-04-19 LAB — CARBAMAZEPINE LEVEL, TOTAL: Carbamazepine Lvl: 6.7 ug/mL (ref 4.0–12.0)

## 2021-04-19 MED ORDER — RISPERIDONE 1 MG PO TBDP
3.0000 mg | ORAL_TABLET | Freq: Every day | ORAL | Status: DC
  Administered 2021-04-20: 3 mg via ORAL
  Filled 2021-04-19 (×2): qty 3

## 2021-04-19 MED ORDER — RISPERIDONE 3 MG PO TABS
3.0000 mg | ORAL_TABLET | Freq: Every day | ORAL | Status: DC
  Filled 2021-04-19: qty 1

## 2021-04-19 MED ORDER — GABAPENTIN 300 MG PO CAPS
600.0000 mg | ORAL_CAPSULE | Freq: Every day | ORAL | Status: DC
  Administered 2021-04-20: 600 mg via ORAL
  Filled 2021-04-19 (×2): qty 2

## 2021-04-19 MED ORDER — RISPERIDONE 2 MG PO TBDP
4.0000 mg | ORAL_TABLET | Freq: Every day | ORAL | Status: DC
  Administered 2021-04-19: 4 mg via ORAL
  Filled 2021-04-19 (×3): qty 2

## 2021-04-19 NOTE — [facility] Group Notes (Signed)
[facility] LCSW Group Therapy Note  Date/Time:  04/19/2021  11:00AM-12:00PM  Type of Therapy and Topic:  Group Therapy:  Music and Mood  Participation Level:  Did Not Attend   Description of Group: In this process group, members listened to a variety of genres of music and identified that different types of music evoke different responses.  Patients were encouraged to identify music that was soothing for them and music that was energizing for them.  Patients discussed how this knowledge can help with wellness and recovery in various ways including managing depression and anxiety as well as encouraging healthy sleep habits.    Therapeutic Goals: 1. Patients will explore the impact of different varieties of music on mood 2. Patients will verbalize the thoughts they have when listening to different types of music 3. Patients will identify music that is soothing to them as well as music that is energizing to them 4. Patients will discuss how to use this knowledge to assist in maintaining wellness and recovery 5. Patients will explore the use of music as a coping skill  Summary of Patient Progress:  Did not attend  Therapeutic Modalities: Solution Focused Brief Therapy Activity   Julies Carmickle Grossman-Orr, LCSW    

## 2021-04-19 NOTE — Progress Notes (Signed)
[facility] MD Progress Note  04/19/2021 11:44 AM [name] [name]  MRN:  [id number] Subjective: Patient is a 55 year old male with a reported past psychiatric history significant for bipolar disorder, most recently manic with psychotic features who was admitted on 04/07/2021 under involuntary commitment.  The patient been placed under involuntary commitment by his probation officer due to concern for pressured speech, flight of ideas and hallucinations.  He was also delusional with regard to grandiose thoughts about [employer] in [place].  Objective: Patient is seen and examined.  Patient is a 55 year old male with the above-stated past psychiatric history seen in follow-up.  Review of nursing notes revealed that the patient continues to be delusional and grandiose at times.  He was noted to have flight of ideas and rambling speech.  This was at 8:21 PM last night.  His medications were not adjusted yesterday.  He continues on Tegretol, Risperdal, Neurontin.  Klonopin was being weaned off, and trazodone was continued.  Review of his laboratories from 4/24 showed normal liver function enzymes, CBC was normal.  Platelets 176,000.  Previously they were 173,006 days ago, but were 276,013 days ago.  Differential was normal.  His Tegretol level on 4/18 was 8.1.  TSH was normal at 1.999.  Hemoglobin A1c is 5.4.  Drug screen was negative.  Vital signs are stable, he is afebrile.  Oxygen saturation on room air was 99%.  He did sleep 7.25 hours last night.  He seems to be better today.  Principal Problem: Bipolar I disorder, current or most recent episode manic, with psychotic features (HCC) Diagnosis: Principal Problem:   Bipolar I disorder, current or most recent episode manic, with psychotic features (HCC) Active Problems:   Colostomy in place [facility])   Chronic pain syndrome   Tobacco abuse   BP (high blood pressure)  Total Time spent with patient: 20 minutes  Past Psychiatric History: See admission H&P  Past  Medical History:  Past Medical History:  Diagnosis Date  . Anxiety   . Bipolar 1 disorder (HCC)   . Chronic fatigue   . Chronic pain   . Complete intestinal obstruction (HCC)   . Depression    Phreesia 12/03/2020  . Fecal peritonitis (HCC) 08/03/2019  . Fibromyalgia   . Hyperlipidemia    Phreesia 12/03/2020  . Ileus, postoperative (HCC)   . Perforated rectum (HCC) 08/03/2019    Past Surgical History:  Procedure Laterality Date  . COLON RESECTION SIGMOID  08/03/2019   Procedure: COLON RESECTION SIGMOID;  Surgeon: Lucretia Roers, MD;  Location: [facility];  Service: General;;  . COLOSTOMY N/A 08/03/2019   Procedure: COLOSTOMY;  Surgeon: Lucretia Roers, MD;  Location: [facility];  Service: General;  Laterality: N/A;  . FLEXIBLE SIGMOIDOSCOPY N/A 08/03/2019   Procedure: FLEXIBLE SIGMOIDOSCOPY;  Surgeon: Corbin Ade, MD;  Location: [facility];  Service: Endoscopy;  Laterality: N/A;  . KNEE ARTHROSCOPY Left 2006   miniscus tear  . LAPAROTOMY N/A 08/03/2019   Procedure: EXPLORATORY LAPAROTOMY;  Surgeon: Lucretia Roers, MD;  Location: [facility];  Service: General;  Laterality: N/A;  . LYMPH GLAND EXCISION Left 1983  . SMALL INTESTINE SURGERY N/A    Phreesia 12/03/2020  . TONSILLECTOMY     Family History:  Family History  Problem Relation Age of Onset  . Diabetes Mother   . Heart attack Father   . Diabetes Maternal Grandmother   . Colon cancer Neg Hx   . Colon polyps Neg Hx  Family Psychiatric  History: See admission H&P Social History:  Social History   Substance and Sexual Activity  Alcohol Use Not Currently   Comment: occasional; denied 10/02/19     Social History   Substance and Sexual Activity  Drug Use Not Currently  . Types: Cocaine   Comment: last used in December 2020.     Social History   Socioeconomic History  . Marital status: Legally Separated    Spouse name: Not on file  . Number of children: Not on file  . Years of education: Not on file  . Highest  education level: Not on file  Occupational History  . Not on file  Tobacco Use  . Smoking status: Current Every Day Smoker    Packs/day: 1.00    Years: 10.00    Pack years: 10.00  . Smokeless tobacco: Never Used  Vaping Use  . Vaping Use: Some days  Substance and Sexual Activity  . Alcohol use: Not Currently    Comment: occasional; denied 10/02/19  . Drug use: Not Currently    Types: Cocaine    Comment: last used in December 2020.   Marland Kitchen Sexual activity: Not Currently  Other Topics Concern  . Not on file  Social History Narrative   Pt lives alone and is on disability. Counts his mother, sister and aunt as his social supports.    Social Determinants of Health   Financial Resource Strain: Not on file  Food Insecurity: Not on file  Transportation Needs: Not on file  Physical Activity: Not on file  Stress: Not on file  Social Connections: Not on file   Additional Social History:                         Sleep: Good  Appetite:  Good  Current Medications: Current Facility-Administered Medications  Medication Dose Route Frequency Provider Last Rate Last Admin  . acetaminophen (TYLENOL) tablet 650 mg  650 mg Oral Q6H PRN Nwoko, Uchenna E, PA   650 mg at 04/17/21 2102  . alum & mag hydroxide-simeth (MAALOX/MYLANTA) 200-200-20 MG/5ML suspension 30 mL  30 mL Oral Q4H PRN Nwoko, Uchenna E, PA      . carbamazepine (TEGRETOL) chewable tablet 200 mg  200 mg Oral BID WC Mariel Craft, MD   200 mg at 04/19/21 0733  . cyclobenzaprine (FLEXERIL) tablet 10 mg  10 mg Oral BID PRN Nwoko, Uchenna E, PA   10 mg at 04/17/21 2101  . gabapentin (NEURONTIN) capsule 400 mg  400 mg Oral Q breakfast Mariel Craft, MD   400 mg at 04/19/21 0254  . gabapentin (NEURONTIN) capsule 800 mg  800 mg Oral BID AC Mariel Craft, MD   800 mg at 04/18/21 1713  . hydrOXYzine (ATARAX/VISTARIL) tablet 25 mg  25 mg Oral TID PRN Karel Jarvis E, PA   25 mg at 04/18/21 2055  . magnesium hydroxide (MILK  OF MAGNESIA) suspension 30 mL  30 mL Oral Daily PRN Nwoko, Uchenna E, PA      . meloxicam (MOBIC) tablet 7.5 mg  7.5 mg Oral BID Mason Jim, Amy E, MD   7.5 mg at 04/19/21 0733  . nicotine (NICODERM CQ - dosed in mg/24 hours) patch 21 mg  21 mg Transdermal Daily Nwoko, Uchenna E, PA   21 mg at 04/19/21 0735  . ondansetron (ZOFRAN) tablet 4 mg  4 mg Oral Q8H PRN Nwoko, Uchenna E, PA   4 mg at 04/12/21  1724  . risperiDONE (RISPERDAL) tablet 3 mg  3 mg Oral BID Mariel Craft, MD   3 mg at 04/19/21 6226  . traZODone (DESYREL) tablet 100 mg  100 mg Oral QHS Bartholomew Crews E, MD   100 mg at 04/18/21 2055    Lab Results:  Results for orders placed or performed during the hospital encounter of 04/07/21 (from the past 48 hour(s))  CBC with Differential/Platelet     Status: Abnormal   Collection Time: 04/19/21  6:50 AM  Result Value Ref Range   WBC 8.3 4.0 - 10.5 K/uL   RBC 4.37 4.22 - 5.81 MIL/uL   Hemoglobin 13.1 13.0 - 17.0 g/dL   HCT 33.3 (L) 54.5 - 62.5 %   MCV 85.6 80.0 - 100.0 fL   MCH 30.0 26.0 - 34.0 pg   MCHC 35.0 30.0 - 36.0 g/dL   RDW 63.8 93.7 - 34.2 %   Platelets 176 150 - 400 K/uL   nRBC 0.0 0.0 - 0.2 %   Neutrophils Relative % 63 %   Neutro Abs 5.3 1.7 - 7.7 K/uL   Lymphocytes Relative 22 %   Lymphs Abs 1.8 0.7 - 4.0 K/uL   Monocytes Relative 7 %   Monocytes Absolute 0.6 0.1 - 1.0 K/uL   Eosinophils Relative 7 %   Eosinophils Absolute 0.5 0.0 - 0.5 K/uL   Basophils Relative 0 %   Basophils Absolute 0.0 0.0 - 0.1 K/uL   Immature Granulocytes 1 %   Abs Immature Granulocytes 0.04 0.00 - 0.07 K/uL    Comment: Performed at [facility], [address], [place], [place] [address]  Hepatic function panel     Status: Abnormal   Collection Time: 04/19/21  6:50 AM  Result Value Ref Range   Total Protein 6.6 6.5 - 8.1 g/dL   Albumin 3.8 3.5 - 5.0 g/dL   AST 23 15 - 41 U/L   ALT 22 0 - 44 U/L   Alkaline Phosphatase 56 38 - 126 U/L   Total Bilirubin 0.1 (L)  0.3 - 1.2 mg/dL   Bilirubin, Direct <1.5 0.0 - 0.2 mg/dL   Indirect Bilirubin NOT CALCULATED 0.3 - 0.9 mg/dL    Comment: Performed at [facility], [address], [place], [place] [address]    Blood Alcohol level:  Lab Results  Component Value Date   ETH <10 04/06/2021   ETH <10 07/17/2020    Metabolic Disorder Labs: Lab Results  Component Value Date   HGBA1C 5.4 04/07/2021   MPG 108.28 04/07/2021   No results found for: PROLACTIN Lab Results  Component Value Date   CHOL 219 (H) 04/09/2021   TRIG 159 (H) 04/09/2021   HDL 52 04/09/2021   CHOLHDL 4.2 04/09/2021   VLDL 32 04/09/2021   LDLCALC 135 (H) 04/09/2021   LDLCALC 103 (H) 04/07/2021    Physical Findings: AIMS: Facial and Oral Movements Muscles of Facial Expression: None, normal Lips and Perioral Area: None, normal Jaw: None, normal Tongue: None, normal,Extremity Movements Upper (arms, wrists, hands, fingers): None, normal Lower (legs, knees, ankles, toes): None, normal, Trunk Movements Neck, shoulders, hips: None, normal, Overall Severity Severity of abnormal movements (highest score from questions above): None, normal Incapacitation due to abnormal movements: None, normal Patient's awareness of abnormal movements (rate only patient's report): No Awareness, Dental Status Current problems with teeth and/or dentures?: No Does patient usually wear dentures?: No  CIWA:    COWS:     Musculoskeletal: Strength &  Muscle Tone: within normal limits Gait & Station: normal Patient leans: N/A  Psychiatric Specialty Exam:  Presentation  General Appearance: Casual  Eye Contact:Fair  Speech:Normal Rate  Speech Volume:Normal  Handedness:Right   Mood and Affect  Mood:Euthymic  Affect:Congruent   Thought Process  Thought Processes:Goal Directed  Descriptions of Associations:Circumstantial  Orientation:Full (Time, Place and Person)  Thought Content:Tangential  History of  Schizophrenia/Schizoaffective disorder:No  Duration of Psychotic Symptoms:Greater than six months  Hallucinations:Hallucinations: None  Ideas of Reference:Paranoia  Suicidal Thoughts:Suicidal Thoughts: No  Homicidal Thoughts:Homicidal Thoughts: No   Sensorium  Memory:Immediate Fair; Recent Fair; Remote Fair  Judgment:Intact  Insight:Poor   Executive Functions  Concentration:Fair  Attention Span:Fair  Recall:Fair  Fund of Knowledge:Fair  Language:Fair   Psychomotor Activity  Psychomotor Activity:Psychomotor Activity: Normal   Assets  Assets:Desire for Improvement; Resilience   Sleep  Sleep:Sleep: Good Number of Hours of Sleep: 7.25    Physical Exam: Physical Exam Vitals and nursing note reviewed.  Constitutional:      Appearance: Normal appearance.  HENT:     Head: Normocephalic and atraumatic.  Pulmonary:     Effort: Pulmonary effort is normal.  Neurological:     General: No focal deficit present.     Mental Status: He is alert and oriented to person, place, and time.    ROS Blood pressure 130/86, pulse 81, temperature 98.7 F (37.1 C), temperature source Oral, resp. rate 20, height 5\' 11"  (1.803 m), weight 91.2 kg, SpO2 99 %. Body mass index is 28.03 kg/m.   Treatment Plan Summary: Daily contact with patient to assess and evaluate symptoms and progress in treatment, Medication management and Plan : Patient is seen and examined.  Patient is a 55 year old male with the above-stated past psychiatric history who is seen in follow-up.   Diagnosis: 1.  Bipolar disorder, most recently manic, severe with psychotic features 2.  Concern for development of thrombocytopenia  Pertinent findings on examination today: 1.  Sleep is improved. 2.  Mania seems to be less disorganized and less tangential. 3.  Liver function enzymes are normal, CBC is normal except for platelet count which seems to be dropping.  Plan: 1.  Increase Risperdal to 3 mg p.o.  daily and 4 mg p.o. nightly for psychosis and mood stability. 2.  Continue Tegretol 200 mg p.o. twice daily for mood stability.  I am somewhat concerned that the platelet count drop may continue and had rather increase his Risperdal rather than the Tegretol. 3.  Increase gabapentin to 600 mg p.o. every morning and 800 mg p.o. q. dinner and supper.  This is for mood stability and chronic pain. 4.  Change Mobic to 15 mg p.o. daily for simplicity of medications for arthritis.  This may also be affecting his platelet count. 5.  Continue trazodone 100 mg p.o. nightly for insomnia. 6.  Disposition planning-I would anticipate the patient ready for discharge in 1 to 2 days.  Antonieta PertGreg Lawson Marquette Blodgett, MD 04/19/2021, 11:44 AM

## 2021-04-19 NOTE — Progress Notes (Signed)
Pt did not attend orientation group.  

## 2021-04-19 NOTE — Progress Notes (Signed)
   04/19/21 2100  Psych Admission Type (Psych Patients Only)  Admission Status Involuntary  Psychosocial Assessment  Patient Complaints Suspiciousness  Eye Contact Fair  Facial Expression Animated  Affect Appropriate to circumstance  Speech Tangential  Interaction Assertive  Motor Activity Slow  Appearance/Hygiene Improved  Behavior Characteristics Cooperative  Mood Preoccupied  Thought Process  Coherency Tangential  Content Obsessions;Preoccupation;Delusions  Delusions Grandeur;Paranoid  Perception WDL  Hallucination None reported or observed  Judgment Limited  Confusion None  Danger to Self  Current suicidal ideation? Denies  Danger to Others  Danger to Others None reported or observed  D: Patient in dayroom reports he had a good day. Pt is preoccupied about discharge and medications. Pt talked continuously about paying billings and all the chores he has to get done after discharge. Colostomy bag changed. A: Medications administered as prescribed. Support and encouragement provided as needed.  R: Patient remains safe on the unit. Will continue to monitor for safety and stability.

## 2021-04-19 NOTE — Progress Notes (Signed)
   04/19/21 0500  Sleep  Number of Hours 7.25

## 2021-04-20 ENCOUNTER — Inpatient Hospital Stay: Payer: PPO | Admitting: Internal Medicine

## 2021-04-20 MED ORDER — CARBAMAZEPINE 100 MG PO CHEW: 200.0000 mg | CHEWABLE_TABLET | Freq: Two times a day (BID) | ORAL | 0 refills | Status: DC

## 2021-04-20 MED ORDER — TRAZODONE HCL 100 MG PO TABS: 100.0000 mg | ORAL_TABLET | Freq: Every day | ORAL | 0 refills | Status: DC

## 2021-04-20 MED ORDER — HYDROXYZINE HCL 25 MG PO TABS: 25.0000 mg | ORAL_TABLET | Freq: Three times a day (TID) | ORAL | 0 refills | Status: DC | PRN

## 2021-04-20 MED ORDER — RISPERIDONE 3 MG PO TBDP: 3.0000 mg | ORAL_TABLET | Freq: Every day | ORAL | 0 refills | Status: DC

## 2021-04-20 MED ORDER — MELOXICAM 7.5 MG PO TABS: 15.0000 mg | ORAL_TABLET | Freq: Every day | ORAL | 0 refills | Status: DC

## 2021-04-20 MED ORDER — RISPERIDONE 4 MG PO TBDP: 4.0000 mg | ORAL_TABLET | Freq: Every day | ORAL | 0 refills | Status: DC

## 2021-04-20 MED ORDER — GABAPENTIN 400 MG PO CAPS: 800.0000 mg | ORAL_CAPSULE | Freq: Two times a day (BID) | ORAL | 0 refills | Status: DC

## 2021-04-20 MED ORDER — GABAPENTIN 300 MG PO CAPS: 600.0000 mg | ORAL_CAPSULE | Freq: Every day | ORAL | 0 refills | Status: DC

## 2021-04-20 NOTE — Plan of Care (Signed)
Pt was able to focus on task/topic with little prompting at completion of recreation therapy group session.   Caroll Rancher, LRT/CTRS

## 2021-04-20 NOTE — Progress Notes (Signed)
  [facility] Adult Case Management Discharge Plan :  Will you be returning to the same living situation after discharge:  Yes,  to home At discharge, do you have transportation home?: No. Safe Transport will be arranged Do you have the ability to pay for your medications: Yes,  has insurance   Release of information consent forms completed and in the chart;  Patient's signature needed at discharge.  Patient to Follow up at:  Follow-up Information    [facility]. Go on 04/20/2021.   Why: You have an appointment with your primary care provider on 04/20/21 at 11:00 am for medication management services.  This appointment will be held in person. Contact information: [address] [place] [address] [phone]       [facility] Follow up on 05/05/2021.   Specialty: Behavioral Health Why: You have an appointment for therapy services on 05/05/21 at 2:00 pm.   This will be a Virtual appointment.  * GIVE 24 HRS NOTICE FOR CANCELLATION AS THERE IS A NO SHOW FEE. Contact information: [address] [place] [address] [phone]       [facility] [place] Follow up on 05/27/2021.   Specialty: Behavioral Health Why: You have an appointment on 05/27/21 at 1:15 pm with Dr. Gilmore Laroche for medication management services.    Contact information: [address] [place] [address] [phone]              Next level of care provider has access to [facility] Link:yes  Safety Planning and Suicide Prevention discussed: Yes,  with mother   Have you used any form of tobacco in the last 30 days? (Cigarettes, Smokeless Tobacco, Cigars, and/or Pipes): Yes  Has patient been referred to the Quitline?: Patient refused referral  Patient has been referred for addiction treatment: Pt. refused referral  Otelia Santee, LCSW 04/20/2021,  9:23 AM

## 2021-04-20 NOTE — Progress Notes (Signed)
Recreation Therapy Notes  INPATIENT RECREATION TR PLAN  Patient Details Name: ELIGH RYBACKI MRN: [id number] DOB: Jan 07, 1966 Today's Date: 04/20/2021  Rec Therapy Plan Is patient appropriate for Therapeutic Recreation?: Yes Treatment times per week: about 3 days Estimated Length of Stay: 5-7 days TR Treatment/Interventions: Group participation (Comment)  Discharge Criteria Pt will be discharged from therapy if:: Discharged Treatment plan/goals/alternatives discussed and agreed upon by:: Patient/family  Discharge Summary Short term goals set: See patient care plan Short term goals met: Complete Progress toward goals comments: Groups attended Which groups?: Coping skills,Leisure education,Communication,Self-esteem Reason goals not met: None Therapeutic equipment acquired: N/A Reason patient discharged from therapy: Discharge from hospital Pt/family agrees with progress & goals achieved: Yes Date patient discharged from therapy: 04/20/21    Victorino Sparrow, LRT/CTRS  Ria Comment, Lodie Waheed A 04/20/2021, 11:46 AM

## 2021-04-20 NOTE — Progress Notes (Signed)
   04/20/21 0623  Vital Signs  Temp 97.8 F (36.6 C)  Temp Source Oral  Pulse Rate 77  Resp 20  BP 136/75  BP Location Right Arm  BP Method Automatic  Patient Position (if appropriate) Sitting  Oxygen Therapy  SpO2 98 %  Discharge Note:  Patient denies SI/HI AVH at this time. Discharge instructions, AVS, prescriptions and transition record gone over with patient. Patient agrees to comply with medication management, follow-up visit, and outpatient therapy. Patient belongings returned to patient. Patient questions and concerns addressed and answered.  Patient ambulatory off unit.  Patient discharged to home with friend.

## 2021-04-20 NOTE — BHH Suicide Risk Assessment (Signed)
[facility] Discharge Suicide Risk Assessment   Principal Problem: Bipolar I disorder, current or most recent episode manic, with psychotic features [facility]) Discharge Diagnoses: Principal Problem:   Bipolar I disorder, current or most recent episode manic, with psychotic features (HCC) Active Problems:   Colostomy in place [facility])   Chronic pain syndrome   Tobacco abuse   BP (high blood pressure)   Total Time spent with patient: 20 minutes  Musculoskeletal: Strength & Muscle Tone: within normal limits Gait & Station: normal Patient leans: N/A  Psychiatric Specialty Exam: Review of Systems  All other systems reviewed and are negative.   Blood pressure 130/81, pulse 79, temperature 97.8 F (36.6 C), temperature source Oral, resp. rate 20, height 5\' 11"  (1.803 m), weight 91.2 kg, SpO2 98 %.Body mass index is 28.03 kg/m.  General Appearance: Casual  Eye Contact::  Fair  Speech:  Normal Rate409  Volume:  Normal  Mood:  Anxious  Affect:  Congruent  Thought Process:  Coherent and Descriptions of Associations: Intact  Orientation:  Full (Time, Place, and Person)  Thought Content:  Logical  Suicidal Thoughts:  No  Homicidal Thoughts:  No  Memory:  Immediate;   Fair Recent;   Fair Remote;   Fair  Judgement:  Intact  Insight:  Fair  Psychomotor Activity:  Increased  Concentration:  Fair  Recall:  [id number] of Knowledge:Fair  Language: Fair  Akathisia:  Negative  Handed:  Right  AIMS (if indicated):     Assets:  Desire for Improvement Housing Resilience  Sleep:  Number of Hours: 6.5  Cognition: WNL  ADL's:  Intact   Mental Status Per Nursing Assessment::   On Admission:  NA  Demographic Factors:  Male, Caucasian and Living alone  Loss Factors: Financial problems/change in socioeconomic status  Historical Factors: Impulsivity  Risk Reduction Factors:   Positive coping skills or problem solving skills  Continued Clinical Symptoms:  Schizophrenia:   Paranoid or  undifferentiated type  Cognitive Features That Contribute To Risk:  Thought constriction (tunnel vision)    Suicide Risk:  Minimal: No identifiable suicidal ideation.  Patients presenting with no risk factors but with morbid ruminations; may be classified as minimal risk based on the severity of the depressive symptoms   Follow-up Information    [facility]. Go on 04/20/2021.   Why: You have an appointment with your primary care provider on 04/20/21 at 11:00 am for medication management services.  This appointment will be held in person. Contact information: [address] [place] [phone]       [facility] Follow up on 05/05/2021.   Specialty: Behavioral Health Why: You have an appointment for therapy services on 05/05/21 at 2:00 pm.   This will be a Virtual appointment.  * GIVE 24 HRS NOTICE FOR CANCELLATION AS THERE IS A NO SHOW FEE. Contact information: [address] [place] [phone]       [facility] [place] Follow up on 05/27/2021.   Specialty: Behavioral Health Why: You have an appointment on 05/27/21 at 1:15 pm with Dr. 07/27/21 for medication management services.    Contact information: [address] [place] [phone]              Plan Of Care/Follow-up recommendations:  Activity:  ad lib  [phone], MD 04/20/2021, 9:36 AM

## 2021-04-20 NOTE — Discharge Summary (Signed)
Physician Discharge Summary Note  Patient:  [name] [name] is an 55 y.o., male MRN:  [id number] DOB:  24-Oct-1966 Patient phone:  [phone] (home)  Patient address:   [address] [place] [address],  Total Time spent with patient: 30 minutes  Date of Admission:  04/07/2021 Date of Discharge: 04/20/2021  Reason for Admission:  (From MD's admission note): [name] [name] is a 55 y.o. male with a history of bipolar d/o, chronic pain syndrome, polysubstance abuse, and perforated rectum s/p partial colectomy and colostomy, who was initially admitted for inpatient psychiatric hospitalization on 04/07/2021 for management of bipolar mania with associated psychotic features. The patient is a poor historian and the majority of his history is obtained from chart review. According to his ED notes prior to admission, the patient was placed under IVC by his probation officer due to concern for pressured speech, flight of ideas, and report of hallucinations. During his ED assessment, the patient was reportedly disorganized in his thinking, was delusional that he was friends with [name] [name] and was helping set up "hits" for the [employer], was paranoid that he had been injected with something, and had rapid speech.   On exam, today, the patient states he is unsure why he is in the hospital and states his mood is "good."  On interview, the patient has disorganized thinking with frequent derailment, is pressured, and makes frequent delusional statements.He reports that he is friends with [name] [name], [name] [name], and [name] [name]. He states he has "70 women" who come into his home to "schedule for him" and he then derails into discussion about how he is friends with the President and helped Biden put together a 5.2 million dollar deal. 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 He denies drug or alcohol use but then derails into discussion about previously being charged for drug paraphernalia when in possession of a "silver slurpee straw" at a gas station. His outpatient records indicate he has a previous h/o polysubstance abuse but do not give details. His UDS prior to admission was negative, but a UDS 8 months ago was positive for opiates, cocaine, benzodiazepines, and amphetamines.   The patient's outpatient primary care records show that in early March 2022, his PCP was suspect that he was hypomanic and was starting to have some delusional thinking. At that visit, he was on Cymbalta  daily and Gabapentin  qid for management of chronic pain. There is also mention in his outpatient records that he was on Klonopin  tid PRN anxiety and Ambien  qhs PRN insomnia. According to PDMP summary he last filled 90 tablets of Klonopin  in December 2021 and last filled Ambien  90 tablets in July 2021. His records indicate that his mother contacted the PCP the end of March to say that the patient had been off his psychotropic medications for 2 years and was decompensating. His records reflect attempts to get him scheduled with his outpatient psychiatrist, Dr. Evelene [name]. Additionally, it appears that at some point the patient was connected with [facility] in [place] in the past. The patient is unclear about his past outpatient psychiatric providers and on interview denies any past psychiatric diagnoses. According to  his records, the patient has a diagnosis of Bipolar I but there are no records of previous inpatient psychiatric admissions. On interview, the patient denies previous suicide attempts or past inpatient admissions. He knows he "cannot take Depakote or Lithium" when questioned about previous mood stabilizers but  is unclear as to why. 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 His chart review also shows that he has previously received IM Ativan, IM Geodon, Elavil, Abilify, Invega, Risperdal, and has a reported allergy to Depakote. The patient does not recognize that he is currently manic or psychotic and could not answer questions about previous manic episodes. Given his present level of disorganization, he could not answer screening questions for depression or comment on recent sleep or appetite.   Evaluation on the unit, day of discharge: Patient was seen and evaluated. He denies SI/HI/AVH, paranoia and delusions. He is calm and cooperative. He has been taking his medications without issues or complications. He is sleeping and eating well. He has attended group therapy and interacting appropriately with staff and peers. Patient's medications have been titrated for effectiveness in controlling his bipolar symptoms. His mood is stable on the current regimen. Patient's mother will be picking him up today. Patient is stable for discharge today.   Principal Problem: Bipolar I disorder, current or most recent episode manic, with psychotic features [name](HCC) Discharge Diagnoses: Principal Problem:   Bipolar I disorder, current or most recent episode manic, with psychotic features (HCC) Active Problems:   Colostomy in place [facility](HCC)   Chronic pain syndrome   Tobacco abuse   BP (high blood pressure)   Past Psychiatric History: See H&P  Past Medical History:  Past Medical History:  Diagnosis Date  . Anxiety   . Bipolar 1 disorder (HCC)   . Chronic fatigue   . Chronic pain   . Complete intestinal obstruction (HCC)   . Depression    Phreesia 12/03/2020  . Fecal peritonitis (HCC) 08/03/2019  . Fibromyalgia   . Hyperlipidemia    Phreesia 12/03/2020  .  Ileus, postoperative (HCC)   . Perforated rectum (HCC) 08/03/2019    Past Surgical History:  Procedure Laterality Date  . COLON RESECTION SIGMOID  08/03/2019   Procedure: COLON RESECTION SIGMOID;  Surgeon: Lucretia RoersBridges, Lindsay C, MD;  Location: [facility];  Service: General;;  . COLOSTOMY N/A 08/03/2019   Procedure: COLOSTOMY;  Surgeon: Lucretia RoersBridges, Lindsay C, MD;  Location: [facility];  Service: General;  Laterality: N/A;  . FLEXIBLE SIGMOIDOSCOPY N/A 08/03/2019   Procedure: FLEXIBLE SIGMOIDOSCOPY;  Surgeon: Corbin Adeourk, Robert M, MD;  Location: [facility];  Service: Endoscopy;  Laterality: N/A;  . KNEE ARTHROSCOPY Left 2006   miniscus tear  . LAPAROTOMY N/A 08/03/2019   Procedure: EXPLORATORY LAPAROTOMY;  Surgeon: Lucretia RoersBridges, Lindsay C, MD;  Location: [facility];  Service: General;  Laterality: N/A;  . LYMPH GLAND EXCISION Left 1983  . SMALL INTESTINE SURGERY N/A    Phreesia 12/03/2020  . TONSILLECTOMY     Family History:  Family History  Problem Relation Age of Onset  . Diabetes Mother   . Heart attack Father   . Diabetes Maternal Grandmother   . Colon cancer Neg Hx   . Colon polyps Neg Hx    Family Psychiatric  History: See H&P Social History:  Social History   Substance and Sexual Activity  Alcohol Use Not Currently  Comment: occasional; denied 10/02/19     Social History   Substance and Sexual Activity  Drug Use Not Currently  . Types: Cocaine   Comment: last used in December 2020.     Social History   Socioeconomic History  . Marital status: Legally Separated    Spouse name: Not on file  . Number of children: Not on file  . Years of education: Not on file  . Highest education level: Not on file  Occupational History  . Not on file  Tobacco Use  . Smoking status: Current Every Day Smoker    Packs/day: 1.00    Years: 10.00    Pack years: 10.00  . Smokeless tobacco: Never Used  Vaping Use  . Vaping Use: Some days  Substance and Sexual Activity  . Alcohol use: Not Currently    Comment:  occasional; denied 10/02/19  . Drug use: Not Currently    Types: Cocaine    Comment: last used in December 2020.   Marland Kitchen Sexual activity: Not Currently  Other Topics Concern  . Not on file  Social History Narrative   Pt lives alone and is on disability. Counts his mother, sister and aunt as his social supports.    Social Determinants of Health   Financial Resource Strain: Not on file  Food Insecurity: Not on file  Transportation Needs: Not on file  Physical Activity: Not on file  Stress: Not on file  Social Connections: Not on file    Hospital Course:  After the above admission evaluation, Diante's presenting symptoms were noted. He was recommended for mood stabilization treatments. The medication regimen targeting those presenting symptoms were discussed with him & initiated with his consent. He was started on Risperdal and Tegretol for his Bipolar symptoms. These medications were dose titrated for effectiveness. He has tolerated this regimen without issues. His UDS and BAL on arrival to the ED were negative.  He was however medicated, stabilized & discharged on the medications as listed on his discharge medication list below. Besides the mood stabilization treatments, Tavonte was also enrolled & participated in the group counseling sessions being offered & held on this unit. He learned coping skills. He presented no other significant pre-existing medical issues that required treatment. He tolerated his treatment regimen without any adverse effects or reactions reported.   During the course of his hospitalization, the 15-minute checks were adequate to ensure patient's safety. Morton did not display any dangerous, violent or suicidal behavior on the unit.  He interacted with patients & staff appropriately, participated appropriately in the group sessions/therapies. His medications were addressed & adjusted to meet his needs. He was recommended for outpatient follow-up care & medication management upon  discharge to assure continuity of care & mood stability.  At the time of discharge patient is not reporting any acute suicidal/homicidal ideations. He feels more confident about his/her self-care & in managing his mental health. He currently denies any new issues or concerns. Education and supportive counseling provided throughout his hospital stay & upon discharge.   Today upon his discharge evaluation with the attending psychiatrist, [place] shares he is doing well. He denies any other specific concerns. He is sleeping well. His appetite is good. He denies other physical complaints. He denies AH/VH, delusional thoughts or paranoia. He does not appear to be responding to any internal stimuli. He feels that his medications have been helpful & is in agreement to continue his current treatment regimen as recommended. He was able to engage in safety  planning including plan to return to [name] or contact emergency services if he feels unable to maintain his own safety or the safety of others. Pt had no further questions, comments, or concerns. He left [facility] with all personal belongings in no apparent distress. Transportation per private vehicle with his mother.   Physical Findings: AIMS: Facial and Oral Movements Muscles of Facial Expression: None, normal Lips and Perioral Area: None, normal Jaw: None, normal Tongue: None, normal,Extremity Movements Upper (arms, wrists, hands, fingers): None, normal Lower (legs, knees, ankles, toes): None, normal, Trunk Movements Neck, shoulders, hips: None, normal, Overall Severity Severity of abnormal movements (highest score from questions above): None, normal Incapacitation due to abnormal movements: None, normal Patient's awareness of abnormal movements (rate only patient's report): No Awareness, Dental Status Current problems with teeth and/or dentures?: No Does patient usually wear dentures?: No  CIWA:    COWS:     Musculoskeletal: Strength & Muscle Tone: within  normal limits Gait & Station: normal Patient leans: N/A  Psychiatric Specialty Exam:  Presentation  General Appearance: Appropriate for Environment  Eye Contact:Good  Speech:Clear and Coherent; Normal Rate  Speech Volume:Normal  Handedness:Right  Mood and Affect  Mood:Euthymic  Affect:Appropriate  Thought Process  Thought Processes:Coherent  Descriptions of Associations:Intact  Orientation:Full (Time, Place and Person)  Thought Content:Logical  History of Schizophrenia/Schizoaffective disorder:Yes  Duration of Psychotic Symptoms:Greater than six months  Hallucinations:Hallucinations: Auditory  Ideas of Reference:Delusions; Paranoia  Suicidal Thoughts:Suicidal Thoughts: No  Homicidal Thoughts:Homicidal Thoughts: No   Sensorium  Memory:Immediate Fair; Recent Fair; Remote Fair  Judgment:Fair  Insight:Fair  Executive Functions  Concentration:Fair  Attention Span:Fair  Recall:Fair  Fund of Knowledge:Fair  Language:Fair  Psychomotor Activity  Psychomotor Activity:Psychomotor Activity: Increased  Assets  Assets:Communication Skills; Desire for Improvement; Resilience; Housing  Sleep  Sleep:Sleep: Good Number of Hours of Sleep: 6.5  Physical Exam: Physical Exam Vitals and nursing note reviewed.  Constitutional:      Appearance: Normal appearance.  HENT:     Head: Normocephalic.  Pulmonary:     Effort: Pulmonary effort is normal.  Musculoskeletal:        General: Normal range of motion.     Cervical back: Normal range of motion.  Neurological:     Mental Status: He is alert and oriented to person, place, and time.  Psychiatric:        Attention and Perception: Attention normal. He does not perceive auditory or visual hallucinations.        Mood and Affect: Mood normal.        Speech: Speech normal.        Behavior: Behavior normal. Behavior is cooperative.        Thought Content: Thought content normal. Thought content is not paranoid  or delusional. Thought content does not include homicidal or suicidal ideation. Thought content does not include homicidal or suicidal plan.        Cognition and Memory: Cognition normal.    Review of Systems  Constitutional: Negative.  Negative for fever.  HENT: Negative.  Negative for congestion, sinus pain and sore throat.   Respiratory: Negative.  Negative for cough and shortness of breath.   Cardiovascular: Negative.  Negative for chest pain.  Gastrointestinal: Negative.   Genitourinary: Negative.   Musculoskeletal: Negative.   Neurological: Negative.    Blood pressure 130/81, pulse 79, temperature 97.8 F (36.6 C), temperature source Oral, resp. rate 20, height  (1.803 m), weight 91.2 kg, SpO2 98 %. Body mass index is 28.03 kg/m.  Have you used any form of tobacco in the last 30 days? (Cigarettes, Smokeless Tobacco, Cigars, and/or Pipes): Yes  Has this patient used any form of tobacco in the last 30 days? (Cigarettes, Smokeless Tobacco, Cigars, and/or Pipes) Yes, Yes, A prescription for an FDA-approved tobacco cessation medication was offered at discharge and the patient refused  Blood Alcohol level:  Lab Results  Component Value Date   [name] <10 04/06/2021   ETH <10 07/17/2020    Metabolic Disorder Labs:  Lab Results  Component Value Date   HGBA1C 5.4 04/07/2021   MPG 108.28 04/07/2021   No results found for: PROLACTIN Lab Results  Component Value Date   CHOL 219 (H) 04/09/2021   TRIG 159 (H) 04/09/2021   HDL 52 04/09/2021   CHOLHDL 4.2 04/09/2021   VLDL 32 04/09/2021   LDLCALC 135 (H) 04/09/2021   LDLCALC 103 (H) 04/07/2021    See Psychiatric Specialty Exam and Suicide Risk Assessment completed by Attending Physician prior to discharge.  Discharge destination:  Home  Is patient on multiple antipsychotic therapies at discharge:  Yes,   Do you recommend tapering to monotherapy for antipsychotics?  No   Has Patient had three or more failed trials of  antipsychotic monotherapy by history:  Yes,   Antipsychotic medications that previously failed include:   1.  abilify . and 2.  invega.  Recommended Plan for Multiple Antipsychotic Therapies: Additional reason(s) for multiple antispychotic treatment:  Patient has remained stable on the current regimen  Discharge Instructions    Diet - low sodium heart healthy   Complete by: As directed    Increase activity slowly   Complete by: As directed      Allergies as of 04/20/2021      Reactions   Codeine Shortness Of Breath, Swelling   Divalproex Sodium Diarrhea, Itching, Rash, Shortness Of Breath, Swelling   Erythromycin Hives, Itching, Rash, Swelling   Iodinated Diagnostic Agents Swelling   Latex Itching, Rash   Sulfa Antibiotics Diarrhea, Itching, Rash, Shortness Of Breath, Swelling   Wheat Bran Hives, Itching, Rash, Shortness Of Breath, Swelling   Azithromycin    Demerol [meperidine]    Levaquin [levofloxacin In D5w]    Levofloxacin    Meperidine Hcl    Morphine    Morphine And Related    Other    arythromycin   Penicillin G    Penicillins    REACTION: Rash and facial swelling at age 33      Medication List    STOP taking these medications   clonazePAM 2 MG tablet Commonly known as: KLONOPIN   cyclobenzaprine 10 MG tablet Commonly known as: FLEXERIL   DULoxetine 60 MG capsule Commonly known as: CYMBALTA   ondansetron 4 MG disintegrating tablet Commonly known as: ZOFRAN-ODT   zolpidem 10 MG tablet Commonly known as: AMBIEN     TAKE these medications     Indication  carbamazepine 100 MG chewable tablet Commonly known as: TEGRETOL Chew 2 tablets (200 mg total) by mouth 2 (two) times daily with a meal.  Indication: Manic-Depression   gabapentin 400 MG capsule Commonly known as: NEURONTIN Take 2 capsules (800 mg total) by mouth 2 (two) times daily before lunch and supper. What changed: You were already taking a medication with the same name, and this prescription  was added. Make sure you understand how and when to take each.  Indication: chronic pain   gabapentin 300 MG capsule Commonly known as: NEURONTIN Take 2 capsules (600 mg total)  by mouth daily with breakfast. Start taking on: April 21, 2021 What changed:   medication strength  how much to take  how to take this  when to take this  additional instructions  Indication: chronic pain   hydrOXYzine 25 MG tablet Commonly known as: ATARAX/VISTARIL Take 1 tablet (25 mg total) by mouth 3 (three) times daily as needed for anxiety.  Indication: Feeling Anxious   meloxicam 7.5 MG tablet Commonly known as: MOBIC Take 2 tablets (15 mg total) by mouth daily. What changed:   how much to take  when to take this  Indication: Joint Damage causing Pain and Loss of Function   risperiDONE 4 MG disintegrating tablet Commonly known as: RISPERDAL M-TABS Take 1 tablet (4 mg total) by mouth at bedtime.  Indication: Manic Phase of Manic-Depression   risperidone 3 MG disintegrating tablet Commonly known as: RISPERDAL M-TABS Take 1 tablet (3 mg total) by mouth daily. Start taking on: April 21, 2021  Indication: MIXED BIPOLAR AFFECTIVE DISORDER   traZODone 100 MG tablet Commonly known as: DESYREL Take 1 tablet (100 mg total) by mouth at bedtime.  Indication: Trouble Sleeping       Follow-up Information    [facility]. Go on 04/20/2021.   Why: You have an appointment with your primary care provider on 04/20/21 at 11:00 am for medication management services.  This appointment will be held in person. Contact information: [address] [place] [address] [phone]       [name] Follow up on 05/05/2021.   Specialty: Behavioral Health Why: You have an appointment for therapy services on 05/05/21 at 2:00 pm.   This will be a Virtual appointment.  * GIVE 24 HRS NOTICE FOR CANCELLATION AS THERE IS A NO  SHOW FEE. Contact information: [address] [place] [address] [phone]       [name] [place] Follow up on 05/27/2021.   Specialty: Behavioral Health Why: You have an appointment on 05/27/21 at 1:15 pm with Dr. Gilmore Laroche for medication management services.    Contact information: [address] [place] [address] [phone]              Follow-up recommendations:  Activity:  as tolerated Diet:  Heart healthy  Comments:  Prescriptions given at discharge.  Patient agreeable to plan.  Given opportunity to ask questions.  Appears to feel comfortable with discharge denies any current suicidal or homicidal thoughts.   Patient is instructed prior to discharge to: Take all medications as prescribed by his mental healthcare provider. Report any adverse effects and or reactions from the medicines to his outpatient provider promptly. Patient has been instructed & cautioned: To not engage in alcohol and or illegal drug use while on prescription medicines. In the event of worsening symptoms, patient is instructed to call the crisis hotline, 911 and or go to the nearest ED for appropriate evaluation and treatment of symptoms. To follow-up with his primary care provider for your other medical issues, concerns and or health care needs.  Signed: Laveda Abbe, NP 04/20/2021, 11:08 AM

## 2021-04-20 NOTE — Progress Notes (Signed)
Recreation Therapy Notes  Date: 4.25.22 Time: 1000 Location: [address] Dayroom  Group Topic: Coping Skills  Goal Area(s) Addresses:  Patient will identify positive coping strategies. Patient will identify benefits of positive coping strategies. Patient will identify negative coping strategies. Patient will identify setbacks of using negative coping strategies.  Behavioral Response: Engaged  Intervention: Worksheet, Pencils  Activity: Healthy vs. Unhealthy Coping Strategies.  Patients were to identify a problem they are currently facing.  Patients were to identify unhealthy coping strategies they have used and consequences of it.  Patients would then identify healthy coping strategies, expected outcomes and barriers to using these healthy coping strategies.  Education: Pharmacologist, Building control surveyor.   Education Outcome: Acknowledges understanding/In group clarification offered/Needs additional education.   Clinical Observations/Feedback: Pt identified current problem as the sale of his property and house.  Unhealthy coping strategies was asking questions which causes realtor gets upset/mad.  Positive coping strategies were working with Youth worker, find another Veterinary surgeon and talking to another realtor.  Barriers were identified as keeping contact and talking to lawyer.    [name] [name], LRT/CTRS    [name] [name] A 04/20/2021 11:38 AM

## 2021-04-20 NOTE — [facility] Group Notes (Signed)
[facility] Group Notes:  (Nursing/MHT/Case Management/Adjunct)  Date:  04/20/2021  Time:  9:29 AM  Type of Therapy:  Group Therapy  Participation Level:  Active  Participation Quality:  Appropriate  Affect:  Appropriate  Cognitive:  Alert  Insight:  Appropriate  Engagement in Group:  Engaged  Modes of Intervention:  Orientation  Summary of Progress/Problems: His goal for today is to get discharged so that he can go home since he has bills to take care of as well as a lot of work to do around the house.   Vibha Ferdig J Javian Nudd 04/20/2021, 9:29 AM

## 2021-04-22 ENCOUNTER — Telehealth: Payer: Self-pay

## 2021-04-22 DIAGNOSIS — K9400 Colostomy complication, unspecified: Secondary | ICD-10-CM | POA: Diagnosis not present

## 2021-04-22 NOTE — Telephone Encounter (Signed)
Needs to change referral to [facility]. Mother called Psychiatry Dr Neysa Hotter does not accept his insurance.

## 2021-04-24 ENCOUNTER — Telehealth: Payer: Self-pay | Admitting: Internal Medicine

## 2021-04-24 NOTE — Telephone Encounter (Signed)
Left message for patient to call back and schedule Medicare Annual Wellness Visit (AWV) either virtually or in office.   AWV-I PER PALMETTO 03/27/14   please schedule at anytime with [facility]  health coach  This should be a 40 minute visit.

## 2021-05-01 ENCOUNTER — Other Ambulatory Visit: Payer: Self-pay | Admitting: Internal Medicine

## 2021-05-05 ENCOUNTER — Ambulatory Visit (HOSPITAL_COMMUNITY): Payer: PPO | Admitting: Clinical

## 2021-05-05 ENCOUNTER — Telehealth: Payer: Self-pay

## 2021-05-05 ENCOUNTER — Ambulatory Visit (INDEPENDENT_AMBULATORY_CARE_PROVIDER_SITE_OTHER): Payer: PPO | Admitting: Clinical

## 2021-05-05 ENCOUNTER — Ambulatory Visit: Payer: PPO | Admitting: Internal Medicine

## 2021-05-05 ENCOUNTER — Other Ambulatory Visit: Payer: Self-pay

## 2021-05-05 DIAGNOSIS — F3112 Bipolar disorder, current episode manic without psychotic features, moderate: Secondary | ICD-10-CM

## 2021-05-05 NOTE — Telephone Encounter (Signed)
Need med refill hydrOXYzine (ATARAX/VISTARIL) 25 MG tablet  risperiDONE (RISPERDAL M-TABS) 4 MG disintegrating tablet   [facility]

## 2021-05-05 NOTE — Telephone Encounter (Signed)
He should have enough medications as they were sent recently. We will evaluate when he comes office visit. Please tell him to bring his medications for review.
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
## 2021-05-06 NOTE — Telephone Encounter (Signed)
noted 

## 2021-05-07 ENCOUNTER — Other Ambulatory Visit: Payer: Self-pay

## 2021-05-07 ENCOUNTER — Ambulatory Visit (INDEPENDENT_AMBULATORY_CARE_PROVIDER_SITE_OTHER): Payer: PPO | Admitting: Internal Medicine

## 2021-05-07 ENCOUNTER — Encounter: Payer: Self-pay | Admitting: Internal Medicine

## 2021-05-07 ENCOUNTER — Ambulatory Visit: Payer: PPO

## 2021-05-07 VITALS — BP 156/97 | HR 94 | Temp 98.7°F | Resp 18 | Ht 71.0 in | Wt 222.0 lb

## 2021-05-07 DIAGNOSIS — F312 Bipolar disorder, current episode manic severe with psychotic features: Secondary | ICD-10-CM | POA: Diagnosis not present

## 2021-05-07 DIAGNOSIS — Z933 Colostomy status: Secondary | ICD-10-CM

## 2021-05-07 DIAGNOSIS — I10 Essential (primary) hypertension: Secondary | ICD-10-CM

## 2021-05-07 DIAGNOSIS — Z9189 Other specified personal risk factors, not elsewhere classified: Secondary | ICD-10-CM | POA: Insufficient documentation

## 2021-05-07 DIAGNOSIS — F419 Anxiety disorder, unspecified: Secondary | ICD-10-CM | POA: Diagnosis not present

## 2021-05-07 MED ORDER — HYDROXYZINE HCL 25 MG PO TABS: 25.0000 mg | ORAL_TABLET | Freq: Three times a day (TID) | ORAL | 2 refills | Status: DC | PRN

## 2021-05-07 MED ORDER — TELMISARTAN 20 MG PO TABS: 20.0000 mg | ORAL_TABLET | Freq: Every day | ORAL | 2 refills | Status: DC
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
## 2021-05-07 NOTE — Patient Instructions (Addendum)
Please start taking Telmisartan for blood pressure.  Please continue taking other medications as prescribed.  Please follow up with Psychiatry as scheduled. [email or web address]">  DASH Eating Plan DASH stands for Dietary Approaches to Stop Hypertension. The DASH eating plan is a healthy eating plan that has been shown to:  Reduce high blood pressure (hypertension).  Reduce your risk for type 2 diabetes, heart disease, and stroke.  Help with weight loss. What are tips for following this plan? Reading food labels  Check food labels for the amount of salt (sodium) per serving. Choose foods with less than 5 percent of the Daily Value of sodium. Generally, foods with less than 300 milligrams (mg) of sodium per serving fit into this eating plan.  To find whole grains, look for the word "whole" as the first word in the ingredient list. Shopping  Buy products labeled as "low-sodium" or "no salt added."  Buy fresh foods. Avoid canned foods and pre-made or frozen meals. Cooking  Avoid adding salt when cooking. Use salt-free seasonings or herbs instead of table salt or sea salt. Check with your health care provider or pharmacist before using salt substitutes.  Do not fry foods. Cook foods using healthy methods such as baking, boiling, grilling, roasting, and broiling instead.  Cook with heart-healthy oils, such as olive, canola, avocado, soybean, or sunflower oil. Meal planning  Eat a balanced diet that includes: ? 4 or more servings of fruits and 4 or more servings of vegetables each day. Try to fill one-half of your plate with fruits and vegetables. ? 6-8 servings of whole grains each day. ? Less than 6 oz (170 g) of lean meat, poultry, or fish each day. A 3-oz (85-g) serving of meat is about the same size as a deck of cards. One egg equals 1 oz (28 g). ? 2-3 servings of low-fat dairy each day. One serving is 1 cup (237 mL). ? 1 serving of  nuts, seeds, or beans 5 times each week. ? 2-3 servings of heart-healthy fats. Healthy fats called omega-3 fatty acids are found in foods such as walnuts, flaxseeds, fortified milks, and eggs. These fats are also found in cold-water fish, such as sardines, salmon, and mackerel.  Limit how much you eat of: ? Canned or prepackaged foods. ? Food that is high in trans fat, such as some fried foods. ? Food that is high in saturated fat, such as fatty meat. ? Desserts and other sweets, sugary drinks, and other foods with added sugar. ? Full-fat dairy products.  Do not salt foods before eating.  Do not eat more than 4 egg yolks a week.  Try to eat at least 2 vegetarian meals a week.  Eat more home-cooked food and less restaurant, buffet, and fast food.   Lifestyle  When eating at a restaurant, ask that your food be prepared with less salt or no salt, if possible.  If you drink alcohol: ? Limit how much you use to:  0-1 drink a day for women who are not pregnant.  0-2 drinks a day for men. ? Be aware of how much alcohol is in your drink. In the [place] one drink equals one 12 oz bottle of beer (355 mL), one 5 oz glass of wine (148 mL), or one 1 oz glass of hard liquor (44 mL). General information  Avoid eating more than 2,300 mg of salt a day. If you have hypertension, you may need to reduce your sodium intake to 1,500 mg a day.  Work with your health care provider to maintain a healthy body weight or to lose weight. Ask what an ideal weight is for you.  Get at least 30 minutes of exercise that causes your heart to beat faster (aerobic exercise) most days of the week. Activities may include walking, swimming, or biking.  Work with your health care provider or dietitian to adjust your eating plan to your individual calorie needs. What foods should I eat? Fruits All fresh, dried, or frozen fruit. Canned fruit in natural juice (without added sugar). Vegetables Fresh or frozen vegetables  (raw, steamed, roasted, or grilled). Low-sodium or reduced-sodium tomato and vegetable juice. Low-sodium or reduced-sodium tomato sauce and tomato paste. Low-sodium or reduced-sodium canned vegetables. Grains Whole-grain or whole-wheat bread. Whole-grain or whole-wheat pasta. Brown rice. [place]. Bulgur. Whole-grain and low-sodium cereals. Pita bread. Low-fat, low-sodium crackers. Whole-wheat flour tortillas. Meats and other proteins Skinless chicken or [place]. Ground chicken or [place]. Pork with fat trimmed off. Fish and seafood. Egg whites. Dried beans, peas, or lentils. Unsalted nuts, nut butters, and seeds. Unsalted canned beans. Lean cuts of beef with fat trimmed off. Low-sodium, lean precooked or cured meat, such as sausages or meat loaves. Dairy Low-fat (1%) or fat-free (skim) milk. Reduced-fat, low-fat, or fat-free cheeses. Nonfat, low-sodium ricotta or cottage cheese. Low-fat or nonfat yogurt. Low-fat, low-sodium cheese. Fats and oils Soft margarine without trans fats. Vegetable oil. Reduced-fat, low-fat, or light mayonnaise and salad dressings (reduced-sodium). Canola, safflower, olive, avocado, soybean, and sunflower oils. Avocado. Seasonings and condiments Herbs. Spices. Seasoning mixes without salt. Other foods Unsalted popcorn and pretzels. Fat-free sweets. The items listed above may not be a complete list of foods and beverages you can eat. Contact a dietitian for more information. What foods should I avoid? Fruits Canned fruit in a light or heavy syrup. Fried fruit. Fruit in cream or butter sauce. Vegetables Creamed or fried vegetables. Vegetables in a cheese sauce. Regular canned vegetables (not low-sodium or reduced-sodium). Regular canned tomato sauce and paste (not low-sodium or reduced-sodium). Regular tomato and vegetable juice (not low-sodium or reduced-sodium). Angie Fava. Olives. Grains Baked goods made with fat, such as croissants, muffins, or some breads. Dry pasta  or rice meal packs. Meats and other proteins Fatty cuts of meat. Ribs. Fried meat. Berniece Salines. Bologna, salami, and other precooked or cured meats, such as sausages or meat loaves. Fat from the back of a pig (fatback). Bratwurst. Salted nuts and seeds. Canned beans with added salt. Canned or smoked fish. Whole eggs or egg yolks. Chicken or [place] with skin. Dairy Whole or 2% milk, cream, and half-and-half. Whole or full-fat cream cheese. Whole-fat or sweetened yogurt. Full-fat cheese. Nondairy creamers. Whipped toppings. Processed cheese and cheese spreads. Fats and oils Butter. Stick margarine. Lard. Shortening. Ghee. Bacon fat. Tropical oils, such as coconut, palm kernel, or palm oil. Seasonings and condiments Onion salt, garlic salt, seasoned salt, table salt, and sea salt. Worcestershire sauce. Tartar sauce. Barbecue sauce. Teriyaki sauce. Soy sauce, including reduced-sodium. Steak sauce. Canned and packaged gravies. Fish sauce. Oyster sauce. Cocktail sauce. Store-bought horseradish. Ketchup. Mustard. Meat flavorings and tenderizers. Bouillon cubes. Hot sauces. Pre-made or packaged marinades. Pre-made or packaged taco seasonings. Relishes. Regular salad dressings. Other foods Salted popcorn and pretzels. The items listed above may not be a complete list of foods and beverages you should avoid. Contact a dietitian for more information. Where to find more information  National Heart, Lung, and Blood Institute: https://wilson-eaton.com/  American Heart Association: www.heart.org  Academy of Nutrition and  Dietetics: www.eatright.[place]: www.kidney.org Summary  The DASH eating plan is a healthy eating plan that has been shown to reduce high blood pressure (hypertension). It may also reduce your risk for type 2 diabetes, heart disease, and stroke.  When on the DASH eating plan, aim to eat more fresh fruits and vegetables, whole grains, lean proteins, low-fat dairy, and  heart-healthy fats.  With the DASH eating plan, you should limit salt (sodium) intake to 2,300 mg a day. If you have hypertension, you may need to reduce your sodium intake to 1,500 mg a day.  Work with your health care provider or dietitian to adjust your eating plan to your individual calorie needs. This information is not intended to replace advice given to you by your health care provider. Make sure you discuss any questions you have with your health care provider. Document Revised: 11/16/2019 Document Reviewed: 11/16/2019 Elsevier Patient Education  2021 [employer].

## 2021-05-07 NOTE — Assessment & Plan Note (Signed)
Recent inpatient Psychiatric treatment for acute mania Was discharged Tegetrol 200 mg BID, Resperidone 3 mg QAM and 4 mg QPM and Gabapentin 600 mg in AM and 800 mg before lunch and dinner Atarax PRN for anxiety Patient does not take medications as prescribed, had a lengthy discussion with patient and her mother separately for compliance of treatment. Explained to the patient about importance of compliance to the treatment. Follow up with [facility] therapist and Psychiatry
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
## 2021-05-07 NOTE — Assessment & Plan Note (Signed)
BP Readings from Last 1 Encounters:  05/07/21 (!) 156/97   Has had elevated BP for last 2 visits, related to anxiety/mania Will start Telmisartan 20 mg QD as concern for long-standing elevated BP Counseled for compliance with the medications Advised DASH diet and moderate exercise/walking, at least 150 mins/week

## 2021-05-07 NOTE — Assessment & Plan Note (Signed)
Atarax PRN, refilled 

## 2021-05-08 ENCOUNTER — Telehealth (INDEPENDENT_AMBULATORY_CARE_PROVIDER_SITE_OTHER): Payer: PPO | Admitting: Licensed Clinical Social Worker

## 2021-05-08 DIAGNOSIS — F312 Bipolar disorder, current episode manic severe with psychotic features: Secondary | ICD-10-CM
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
## 2021-05-13 ENCOUNTER — Telehealth: Payer: Self-pay | Admitting: Licensed Clinical Social Worker

## 2021-05-13 DIAGNOSIS — F312 Bipolar disorder, current episode manic severe with psychotic features: Secondary | ICD-10-CM

## 2021-05-13 NOTE — Progress Notes (Signed)
[facility] Psychiatric Consultant Case Review   [name] [name] is a 55 y.o. year old male with a history of bipolar I disorder.  Per chart review, he was admitted for bipolar 1 disorder in April.  According to the intake by [facility] specialist, he was observed to have disorganized/tangential thought process with pressured speech.  He walked out of the session.  It is unclear whether he was under the influence of any substance.    Assessment/Provisional Diagnosis #  Bipolar I disorder He was referred to the [facility] because he would need a higher level of care such as CST/ACTT.   Recommendation  - Referral to [facility]  Thank you for your consult. We will sign off. Please contact [facility]  for any questions or concerns.   The above treatment considerations and suggestions are based on consultation with the [facility] specialist and/or PCP and a review of information available in the shared registry and the patient's Electronic Health Record (EHR). I have not personally examined the patient. All recommendations should be implemented with consideration of the patient's relevant prior history and current clinical status. Please feel free to call me with any questions about the care of this patient.

## 2021-05-13 NOTE — BH Specialist Note (Signed)
Virtual Behavioral Health Treatment Plan Team Note  MRN: [id number] NAME: [name] [name]  DATE: 05/20/21  Start time:   345pEnd time:  355p Total time:  10 min  Total number of Virtual BH Treatment Team Plan encounters: 1/4  Treatment Team Attendees: Nolon Rod, LCSW; Dr. Vanetta Shawl, Psychiatrist  Diagnoses:    ICD-10-CM   1. Bipolar I disorder, current or most recent episode manic, with psychotic features (HCC)  F31.2     Goals, Interventions and Follow-up Plan Goals: Increase healthy adjustment to current life circumstances Increase adequate support systems for patient/family Increase motivation to adhere to plan of care Improve medication compliance Interventions: CBT Cognitive Behavioral Therapy Medication Management Recommendations: we will sign off Follow-up Plan: Referral to CST or ACTT program  History of the present illness Presenting Problem/Current Symptoms: symptoms of diagnosis  Psychiatric History  Depression: Yes Anxiety: Yes Mania: Yes Psychosis: Yes PTSD symptoms: Yes  Past Psychiatric History/Hospitalization(s): Hospitalization for psychiatric illness: Yes Prior Suicide Attempts: Yes Prior Self-injurious behavior: Yes   Screenings PHQ-9 Assessments:  Depression screen [facility] 2/9 05/07/2021 05/05/2021 03/04/2021  Decreased Interest 0 0 0  Down, Depressed, Hopeless 0 1 0  PHQ - 2 Score 0 1 0  Altered sleeping - 0 -  Tired, decreased energy - 3 -  Change in appetite - 0 -  Feeling bad or failure about yourself  - 0 -  Trouble concentrating - 0 -  Moving slowly or fidgety/restless - 2 -  Suicidal thoughts - 0 -  PHQ-9 Score - 6 -  Difficult doing work/chores - Somewhat difficult -   GAD-7 Assessments:  GAD 7 : Generalized Anxiety Score 05/05/2021  Nervous, Anxious, on Edge 2  Control/stop worrying 0  Worry too much - different things 1  Trouble relaxing 1  Restless 0  Easily annoyed or irritable 0  Afraid - awful might happen 0  Total GAD 7  Score 4  Anxiety Difficulty Somewhat difficult    Past Medical History Past Medical History:  Diagnosis Date  . Anxiety   . Bipolar 1 disorder (HCC)   . Chronic fatigue   . Chronic pain   . Complete intestinal obstruction (HCC)   . Depression    Phreesia 12/03/2020  . Fecal peritonitis (HCC) 08/03/2019  . Fibromyalgia   . Hyperlipidemia    Phreesia 12/03/2020  . Ileus, postoperative (HCC)   . Perforated rectum (HCC) 08/03/2019    Vital signs: There were no vitals filed for this visit.  Allergies:  Allergies as of 05/13/2021 - Review Complete 05/07/2021  Allergen Reaction Noted  . Codeine Shortness Of Breath and Swelling 12/03/2020  . Divalproex sodium Diarrhea, Itching, Rash, Shortness Of Breath, and Swelling 12/03/2020  . Erythromycin Hives, Itching, Rash, and Swelling 12/03/2020  . Iodinated diagnostic agents Swelling 12/03/2020  . Latex Itching and Rash 12/03/2020  . Sulfa antibiotics Diarrhea, Itching, Rash, Shortness Of Breath, and Swelling 12/03/2020  . Wheat bran Hives, Itching, Rash, Shortness Of Breath, and Swelling 12/03/2020  . Azithromycin  11/25/2020  . Demerol [meperidine]  05/30/2017  . Levaquin [levofloxacin in d5w]  05/30/2017  . Levofloxacin  11/25/2020  . Meperidine hcl  11/25/2020  . Morphine  11/25/2020  . Morphine and related  05/30/2017  . Other  07/08/2011  . Penicillin g  11/25/2020  . Penicillins  04/26/2007    Medication History Current medications:  Outpatient Encounter Medications as of 05/13/2021  Medication Sig  . carbamazepine (TEGRETOL) 100 MG chewable tablet Chew 2 tablets (200 mg  total) by mouth 2 (two) times daily with a meal.  . gabapentin (NEURONTIN) 300 MG capsule Take 2 capsules (600 mg total) by mouth daily with breakfast.  . gabapentin (NEURONTIN) 400 MG capsule Take 2 capsules (800 mg total) by mouth 2 (two) times daily before lunch and supper.  . hydrOXYzine (ATARAX/VISTARIL) 25 MG tablet Take 1 tablet (25 mg total) by mouth  3 (three) times daily as needed for anxiety.  . meloxicam (MOBIC) 7.5 MG tablet Take 2 tablets (15 mg total) by mouth daily.  . risperiDONE (RISPERDAL M-TABS) 3 MG disintegrating tablet Take 1 tablet (3 mg total) by mouth daily.  . risperiDONE (RISPERDAL M-TABS) 4 MG disintegrating tablet Take 1 tablet (4 mg total) by mouth at bedtime.  Marland Kitchen telmisartan (MICARDIS) 20 MG tablet Take 1 tablet (20 mg total) by mouth daily.  . traZODone (DESYREL) 100 MG tablet Take 1 tablet (100 mg total) by mouth at bedtime.   No facility-administered encounter medications on file as of 05/13/2021.     Scribe for Treatment Team: Marinda Elk, LCSW

## 2021-05-18 DIAGNOSIS — K9400 Colostomy complication, unspecified: Secondary | ICD-10-CM | POA: Diagnosis not present

## 2021-05-27 ENCOUNTER — Other Ambulatory Visit: Payer: Self-pay

## 2021-05-27 ENCOUNTER — Ambulatory Visit (INDEPENDENT_AMBULATORY_CARE_PROVIDER_SITE_OTHER): Payer: PPO | Admitting: Internal Medicine

## 2021-05-27 ENCOUNTER — Encounter (HOSPITAL_COMMUNITY): Payer: Self-pay | Admitting: Psychiatry

## 2021-05-27 ENCOUNTER — Encounter: Payer: Self-pay | Admitting: Internal Medicine

## 2021-05-27 ENCOUNTER — Telehealth (INDEPENDENT_AMBULATORY_CARE_PROVIDER_SITE_OTHER): Payer: PPO | Admitting: Psychiatry

## 2021-05-27 VITALS — BP 141/92 | HR 81 | Temp 97.6°F | Ht 71.0 in | Wt 231.0 lb

## 2021-05-27 DIAGNOSIS — I10 Essential (primary) hypertension: Secondary | ICD-10-CM | POA: Diagnosis not present

## 2021-05-27 DIAGNOSIS — F312 Bipolar disorder, current episode manic severe with psychotic features: Secondary | ICD-10-CM

## 2021-05-27 DIAGNOSIS — F419 Anxiety disorder, unspecified: Secondary | ICD-10-CM | POA: Diagnosis not present

## 2021-05-27 DIAGNOSIS — F5102 Adjustment insomnia: Secondary | ICD-10-CM

## 2021-05-27 DIAGNOSIS — F3112 Bipolar disorder, current episode manic without psychotic features, moderate: Secondary | ICD-10-CM

## 2021-05-27 DIAGNOSIS — G894 Chronic pain syndrome: Secondary | ICD-10-CM | POA: Diagnosis not present

## 2021-05-27 DIAGNOSIS — Z933 Colostomy status: Secondary | ICD-10-CM | POA: Diagnosis not present

## 2021-05-27 MED ORDER — TRAZODONE HCL 100 MG PO TABS: 100.0000 mg | ORAL_TABLET | Freq: Every day | ORAL | 0 refills | Status: DC

## 2021-05-27 MED ORDER — HYDROXYZINE HCL 25 MG PO TABS
25.0000 mg | ORAL_TABLET | Freq: Two times a day (BID) | ORAL | 0 refills | Status: DC | PRN
Start: 2021-05-27 — End: 2021-06-25

## 2021-05-27 MED ORDER — CARBAMAZEPINE 100 MG PO CHEW: 200.0000 mg | CHEWABLE_TABLET | Freq: Two times a day (BID) | ORAL | 0 refills | Status: DC

## 2021-05-27 MED ORDER — GABAPENTIN 300 MG PO CAPS: 600.0000 mg | ORAL_CAPSULE | Freq: Every day | ORAL | 0 refills | Status: DC

## 2021-05-27 MED ORDER — RISPERIDONE 4 MG PO TABS: 4.0000 mg | ORAL_TABLET | Freq: Every day | ORAL | 0 refills | Status: DC

## 2021-05-27 NOTE — Progress Notes (Signed)
Psychiatric Initial Adult Assessment   Patient Identification: [name] [name] MRN:  [id number] Date of Evaluation:  05/27/2021 Referral Source: [facility] Discharge Chief Complaint:  establish care, bipolar  Visit Diagnosis:    ICD-10-CM   1. Adjustment insomnia  F51.02   2. Anxiety  F41.9 hydrOXYzine (ATARAX/VISTARIL) 25 MG tablet  3. Bipolar disorder, curr episode manic w/o psychotic features, moderate (HCC)  F31.12 Carbamazepine Level (Tegretol), total  4. Bipolar affective disorder, currently manic, severe, with psychotic features (HCC)  F31.2    Virtual Visit via Video Note  I connected with [name] [name] on 05/27/21 at  1:15 PM EDT by a video enabled telemedicine application and verified that I am speaking with the correct person using two identifiers.  Location: Patient: home Provider: home office    I discussed the limitations of evaluation and management by telemedicine and the availability of in person appointments. The patient expressed understanding and agreed to proceed.       I discussed the assessment and treatment plan with the patient. The patient was provided an opportunity to ask questions and all were answered. The patient agreed with the plan and demonstrated an understanding of the instructions.   The patient was advised to call back or seek an in-person evaluation if the symptoms worsen or if the condition fails to improve as anticipated.  I provided 60  minutes of non-face-to-face time during this encounter including chart review/ documentation.   History of Present Illness:  Patient referred from discharge during April admission Admitted with mania and psychosis, has stated at that time that he has been injected with neurotoxins and was delusional and psychotic including having elevated mood and manic symptoms see admission note on 4/12 He describes himself to be separated living by himself on disability for pain and depression condition including  fibromyalgia   Also reviewed notes of admission and discharge see below recorded at admission " [name] [name] is a 55 y.o. male with a history of bipolar d/o, chronic pain syndrome, polysubstance abuse, and perforated rectum s/p partial colectomy and colostomy, who was initially admitted for inpatient psychiatric hospitalization on 04/07/2021 for management of bipolar mania with associated psychotic features. The patient is a poor historian and the majority of his history is obtained from chart review. According to his ED notes prior to admission, the patient was placed under IVC by his probation officer due to concern for pressured speech, flight of ideas, and report of hallucinations. During his ED assessment, the patient was reportedly disorganized in his thinking, was delusional that he was friends with [name] [name] and was helping set up "hits" for [employer], was paranoid that he had been injected with something, and had rapid speech. "  His medications were adjusted he was stopped Klonopin or Ambien he was continued on Tegretol, Risperdal, trazodone and hydroxyzine he was also given gabapentin he has follow-up with primary care physician for his pain related condition including fibromyalgia  In the liver visit according to counselor note it was suggested to refer him for higher level of care or ACT program He was again referred here by his primary care physician for his manic symptoms  He has seen different psychiatrist in the past and [facility] psychiatry for last more then 10 years.  Also remains vague about history and has seen [facility] providers but has shown discontent with either the provider over the medication or noncompliance leading to hospital admission and on stability He does have the support of his mother  As of now he feels the medication is working keeping her balance he remains hyperverbal but not paranoid does not endorse delusional content regarding injection with neurotoxins or  threatening  He wants to respond to be a swallow tablet rather than an tablet He also focused on his fibromyalgia related pain and will follow with primary care physician  He describes he keeps himself busy with social media and that helps his mood he has some friends he denies past use of drugs denies remains vague about hospital admission said that everything was cleared he was not threatening and it was a misunderstanding  He did discuss his medication and he focused on compliance so that he does not get unstable he will also follow with his primary care physician regarding his pain condition and medical comorbidity  Has been on different antipsychotics in the past he feels comfortable with the Tegretol and the Resporal as a mood stabilizer but he believes hydroxyzine has helped for the anxiety and is not taking Klonopin that he has used before  Overall did not endorse any delusional content but remains hyperverbal at times irrelevant but was able to be redirectable  Aggravating factors; noncompliance, medical comorbidity on colostomy bag Modifying factors; mother, on disability Duration most of his adult life  Compliance poor as according to past history and notes reviewed  Past Psychiatric History: bipolar disorder  Previous Psychotropic Medications: Yes   Substance Abuse History in the last 12 months:  No.  Consequences of Substance Abuse: not gives clear history of using drugs or minimizes use  Past Medical History:  Past Medical History:  Diagnosis Date  . Anxiety   . Bipolar 1 disorder (HCC)   . Chronic fatigue   . Chronic pain   . Complete intestinal obstruction (HCC)   . Depression    Phreesia 12/03/2020  . Fecal peritonitis (HCC) 08/03/2019  . Fibromyalgia   . Hyperlipidemia    Phreesia 12/03/2020  . Ileus, postoperative (HCC)   . Perforated rectum (HCC) 08/03/2019    Past Surgical History:  Procedure Laterality Date  . COLON RESECTION SIGMOID  08/03/2019    Procedure: COLON RESECTION SIGMOID;  Surgeon: Lucretia RoersBridges, Lindsay C, MD;  Location: [facility];  Service: General;;  . COLOSTOMY N/A 08/03/2019   Procedure: COLOSTOMY;  Surgeon: Lucretia RoersBridges, Lindsay C, MD;  Location: [facility];  Service: General;  Laterality: N/A;  . FLEXIBLE SIGMOIDOSCOPY N/A 08/03/2019   Procedure: FLEXIBLE SIGMOIDOSCOPY;  Surgeon: Corbin Adeourk, Robert M, MD;  Location: [facility];  Service: Endoscopy;  Laterality: N/A;  . KNEE ARTHROSCOPY Left 2006   miniscus tear  . LAPAROTOMY N/A 08/03/2019   Procedure: EXPLORATORY LAPAROTOMY;  Surgeon: Lucretia RoersBridges, Lindsay C, MD;  Location: [facility];  Service: General;  Laterality: N/A;  . LYMPH GLAND EXCISION Left 1983  . SMALL INTESTINE SURGERY N/A    Phreesia 12/03/2020  . TONSILLECTOMY      Family Psychiatric History: denies  Family History:  Family History  Problem Relation Age of Onset  . Diabetes Mother   . Heart attack Father   . Diabetes Maternal Grandmother   . Colon cancer Neg Hx   . Colon polyps Neg Hx     Social History:   Social History   Socioeconomic History  . Marital status: Legally Separated    Spouse name: Not on file  . Number of children: Not on file  . Years of education: Not on file  . Highest education level: Not on file  Occupational History  .  Not on file  Tobacco Use  . Smoking status: Current Every Day Smoker    Packs/day: 1.00    Years: 10.00    Pack years: 10.00  . Smokeless tobacco: Never Used  Vaping Use  . Vaping Use: Some days  Substance and Sexual Activity  . Alcohol use: Not Currently    Comment: occasional; denied 10/02/19  . Drug use: Not Currently    Types: Cocaine    Comment: last used in December 2020.   Marland Kitchen Sexual activity: Not Currently  Other Topics Concern  . Not on file  Social History Narrative   Pt lives alone and is on disability. Counts his mother, sister and aunt as his social supports.    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 Currently seperated and on disability   Allergies:   Allergies  Allergen Reactions  . Codeine Shortness Of Breath and Swelling  . Divalproex Sodium Diarrhea, Itching, Rash, Shortness Of Breath and Swelling  . Erythromycin Hives, Itching, Rash and Swelling  . Iodinated Diagnostic Agents Swelling  . Latex Itching and Rash  . Sulfa Antibiotics Diarrhea, Itching, Rash, Shortness Of Breath and Swelling  . Wheat Bran Hives, Itching, Rash, Shortness Of Breath and Swelling  . Azithromycin   . Demerol [Meperidine]   . Levaquin [Levofloxacin In D5w]   . Levofloxacin   . Meperidine Hcl   . Morphine   . Morphine And Related   . Other     arythromycin  . Penicillin G   . Penicillins     REACTION: Rash and facial swelling at age 27    Metabolic Disorder Labs: Lab Results  Component Value Date   HGBA1C 5.4 04/07/2021   MPG 108.28 04/07/2021   No results found for: PROLACTIN Lab Results  Component Value Date   CHOL 219 (H) 04/09/2021   TRIG 159 (H) 04/09/2021   HDL 52 04/09/2021   CHOLHDL 4.2 04/09/2021   VLDL 32 04/09/2021   LDLCALC 135 (H) 04/09/2021   LDLCALC 103 (H) 04/07/2021   Lab Results  Component Value Date   TSH 1.999 04/07/2021    Therapeutic Level Labs: Lab Results  Component Value Date   LITHIUM <0.06 (L) 09/20/2019   Lab Results  Component Value Date   CBMZ 6.7 04/19/2021   No results found for: VALPROATE  Current Medications: Current Outpatient Medications  Medication Sig Dispense Refill  . risperidone (RISPERDAL) 4 MG tablet Take 1 tablet (4 mg total) by mouth at bedtime. 30 tablet 0  . carbamazepine (TEGRETOL) 100 MG chewable tablet Chew 2 tablets (200 mg total) by mouth 2 (two) times daily with a  meal. 60 tablet 0  . gabapentin (NEURONTIN) 300 MG capsule Take 2 capsules (600 mg total) by mouth daily with breakfast. 60 capsule 0  . gabapentin (NEURONTIN) 400 MG capsule Take 2 capsules (800 mg total) by mouth 2 (two) times daily before lunch and supper. 120 capsule 0  . hydrOXYzine (ATARAX/VISTARIL) 25 MG tablet Take 1 tablet (25 mg total) by mouth 2 (two) times daily as needed for anxiety. Delete prior refills 60 tablet 0  . meloxicam (MOBIC) 7.5 MG tablet Take 2 tablets (15 mg  total) by mouth daily. 30 tablet 0  . telmisartan (MICARDIS) 20 MG tablet Take 1 tablet (20 mg total) by mouth daily. 30 tablet 2  . traZODone (DESYREL) 100 MG tablet Take 1 tablet (100 mg total) by mouth at bedtime. 30 tablet 0   No current facility-administered medications for this visit.      Psychiatric Specialty Exam: Review of Systems  Cardiovascular: Negative for chest pain.  Psychiatric/Behavioral: Negative for self-injury.    There were no vitals taken for this visit.There is no height or weight on file to calculate BMI.  General Appearance: Casual  Eye Contact:  Fair  Speech:  somewhat rambling  Volume:  Normal  Mood:  Euthymic  Affect:  Full Range  Thought Process:  Coherent  Orientation:  Full (Time, Place, and Person)  Thought Content:  Rumination  Suicidal Thoughts:  No  Homicidal Thoughts:  No  Memory:  Immediate;   Fair Recent;   Fair  Judgement:  Other:  limited  Insight:  Shallow  Psychomotor Activity:  Normal  Concentration:  Concentration: Fair and Attention Span: Fair  Recall:  [employer] of Knowledge:Fair  Language: Fair  Akathisia:  No  Handed:    AIMS (if indicated):no involuntary movements noteiceable  Assets:  Desire for Improvement  ADL's:  Intact  Cognition: WNL  Sleep:  Fair   Screenings: AIMS   Flowsheet Row [facility]  AIMS Total Score 0    AUDIT   Flowsheet Row Admission  ([facility]charged[facility]  Alcohol Use Disorder Identification Test Final Score (AUDIT) 0    GAD-7   Flowsheet Row Counselor [facility]20[facility]  Total GAD-7 Score 4    PHQ2-9   Flowsheet Row Video Visit from [facility]20[facility] [place] Office Visit from 05/07/2021 in [facility] Counselor from 05/05/2021 [facility] Office Visit from 03/04/2021 in [facility] Office Visit from 12/04/2020 in [facility]  PHQ-2 Total Score 1 0 1 0 2  PHQ-9 Total Score 6 -- 6 -- 9    Flowsheet Row Video Visit from 05/27/2021 in [facility] [place] Counselor from [facility]05/2021 [facility]- Admission (Discharged[facility]  C-SSRS RISK CATEGORY Error: Question 6 not populated No Risk No Risk      Assessment and Plan: as follows  Bipolar current episode manic with psychosis: partial remission  Discussed compliance.  Not sure if he is taking the medication as scheduled but he agrees to be taking the 4 mg Risperdal, Tegretol he will get the blood work from primary care office I will send the request.  To the lab as well. Last note from [facility] suggest should get a higher level of care but he has scheduled at this outpatient discussed compliance as otherwise he has history of hospitalization and noncompliance Adjustment insomnia reviewed sleep hygiene continue trazodone Anxiety disorder unspecified he does like Atarax or hydroxyzine which we will continue he can take it up to 2 times a day  Discussed to abstain from any alcohol or drugs discussed keep activity level and engage in distraction from negative thoughts I would prefer to see him in office next visit in July Refills sent  but not sure if he is in compliant with the medication  Thresa Ross, MD 6/1/20222:01 PM

## 2021-05-27 NOTE — Assessment & Plan Note (Signed)
BP Readings from Last 1 Encounters:  05/27/21 (!) 141/92   Uncontrolled due to noncompliance Need to start taking Telmisartan Counseled for compliance with the medications Advised DASH diet and moderate exercise/walking, at least 150 mins/week

## 2021-05-27 NOTE — Patient Instructions (Signed)
Please start taking Telmisartan as soon as possible for high blood pressure.  Please start taking Risperidone as prescribed by your Psychiatrist.  Please continue to follow low salt diet and ambulate as tolerated.

## 2021-05-27 NOTE — Assessment & Plan Note (Signed)
Recent inpatient Psychiatric treatment for acute mania Was discharged Tegetrol 200 mg BID, Resperidone 3 mg QAM and 4 mg QPM and Gabapentin 600 mg in AM and 800 mg before lunch and dinner Atarax PRN for anxiety Now on Risperidone 4 mg qHS. Explained to the patient about importance of compliance to the treatment. Follow up with [facility] therapist and Psychiatry Will discuss with Psychiatry about Gabapentin dose

## 2021-05-27 NOTE — Assessment & Plan Note (Addendum)
Refilled Gabapentin for now Will discuss with Psychiatry with appropriate dosing for him for his Psych history Referred to pain clinic

## 2021-05-27 NOTE — Assessment & Plan Note (Signed)
Had perforated rectum due to foreign body, s/p partial colectomy Currently has colostomy in place, saw General surgeon - needs colonoscopy before proceeding for colostomy reversal Could not find GI who was willing to perform colonoscopy for him considering his Psychiatric and social history, will make GI referral later once his Psychiatric condition is stable 

## 2021-05-27 NOTE — Progress Notes (Signed)
Established Patient Office Visit  Subjective:  Patient ID: [name] [name], male    DOB: [birth year]  Age: 55 y.o. MRN: [id number]  CC:  Chief Complaint  Patient presents with  . Hypertension    Follow up    HPI [name] [name] is a 55 year old male with PMH of bipolar disorder, chronic pain syndrome, polysubstance abuse, and perforated rectum s/p partial colectomy and colostomywho presents for follow up of his chronic medical conditions.  HTN: His BP is still elevated. He has not started Telmisartan yet, but states that he will start it now. Denies any chest pain, dyspnea or palpitations.  Bipolar disorder: He had Psychiatrist visit recently. He is to continue Tegretol, Risperidone, Atarax and Trazodone for now. He still appears to be inattentive and hypomanic at times as compliance is questionable.  He asks to be referred to pain clinic for chronic pain. He is on Gabapentin, which was prescribed inpatient Psych.   Past Medical History:  Diagnosis Date  . Anxiety   . Bipolar 1 disorder ([place])   . Chronic fatigue   . Chronic pain   . Complete intestinal obstruction ([place])   . Depression    Phreesia 12/03/2020  . Fecal peritonitis ([place]) 08/03/2019  . Fibromyalgia   . Hyperlipidemia    Phreesia 12/03/2020  . Ileus, postoperative ([place])   . Perforated rectum ([place]) 08/03/2019    Past Surgical History:  Procedure Laterality Date  . COLON RESECTION SIGMOID  08/03/2019   Procedure: COLON RESECTION SIGMOID;  Surgeon: Virl Cagey, MD;  Location[facility];  Service: General;;  . COLOSTOMY N/A 08/03/2019   Procedure: COLOSTOMY;  Surgeon: Virl Cagey, MD;  Location: [facility];  Service: General;  Laterality: N/A;  . FLEXIBLE SIGMOIDOSCOPY N/A 08/03/2019   Procedure: FLEXIBLE SIGMOIDOSCOPY;  Surgeon: Daneil Dolin, MD;  [facility];  Service: Endoscopy;  Laterality: N/A;  . KNEE ARTHROSCOPY Left 2006   miniscus tear  . LAPAROTOMY N/A 08/03/2019   Procedure: EXPLORATORY  LAPAROTOMY;  Surgeon: Virl Cagey, MD;  Location: [facility];  Service: General;  Laterality: N/A;  . LYMPH GLAND EXCISION Left 1983  . SMALL INTESTINE SURGERY N/A    Phreesia 12/03/2020  . TONSILLECTOMY      Family History  Problem Relation Age of Onset  . Diabetes Mother   . Heart attack Father   . Diabetes Maternal Grandmother   . Colon cancer Neg Hx   . Colon polyps Neg Hx     Social History   Socioeconomic History  . Marital status: Legally Separated    Spouse name: Not on file  . Number of children: Not on file  . Years of education: Not on file  . Highest education level: Not on file  Occupational History  . Not on file  Tobacco Use  . Smoking status: Current Every Day Smoker    Packs/day: 1.00    Years: 10.00    Pack years: 10.00  . Smokeless tobacco: Never Used  Vaping Use  . Vaping Use: Some days  Substance and Sexual Activity  . Alcohol use: Not Currently    Comment: occasional; denied 10/02/19  . Drug use: Not Currently    Types: Cocaine    Comment: last used in December 2020.   Marland Kitchen Sexual activity: Not Currently  Other Topics Concern  . Not on file  Social History Narrative   Pt lives alone and is on disability. Counts his mother, sister and aunt as his social  supports.    Social Determinants of Health   Financial Resource Strain: Not on file  Food Insecurity: Not on file  Transportation Needs: Not on file  Physical Activity: Not on file  Stress: Not on file  Social Connections: Not on file  Intimate Partner Violence: Not on file    Outpatient Medications Prior to Visit  Medication Sig Dispense Refill  . carbamazepine (TEGRETOL) 100 MG chewable tablet Chew 2 tablets (200 mg total) by mouth 2 (two) times daily with a meal. 60 tablet 0  . hydrOXYzine (ATARAX/VISTARIL) 25 MG tablet Take 1 tablet (25 mg total) by mouth 2 (two) times daily as needed for anxiety. Delete prior refills 60 tablet 0  . meloxicam (MOBIC) 7.5 MG tablet Take 2 tablets (15  mg total) by mouth daily. 30 tablet 0  . risperidone (RISPERDAL) 4 MG tablet Take 1 tablet (4 mg total) by mouth at bedtime. 30 tablet 0  . telmisartan (MICARDIS) 20 MG tablet Take 1 tablet (20 mg total) by mouth daily. 30 tablet 2  . traZODone (DESYREL) 100 MG tablet Take 1 tablet (100 mg total) by mouth at bedtime. 30 tablet 0  . gabapentin (NEURONTIN) 400 MG capsule Take 2 capsules (800 mg total) by mouth 2 (two) times daily before lunch and supper. 120 capsule 0  . gabapentin (NEURONTIN) 300 MG capsule Take 2 capsules (600 mg total) by mouth daily with breakfast. 60 capsule 0   No facility-administered medications prior to visit.    Allergies  Allergen Reactions  . Codeine Shortness Of Breath and Swelling  . Divalproex Sodium Diarrhea, Itching, Rash, Shortness Of Breath and Swelling  . Erythromycin Hives, Itching, Rash and Swelling  . Iodinated Diagnostic Agents Swelling  . Latex Itching and Rash  . Sulfa Antibiotics Diarrhea, Itching, Rash, Shortness Of Breath and Swelling  . Wheat Bran Hives, Itching, Rash, Shortness Of Breath and Swelling  . Azithromycin   . Demerol [Meperidine]   . Levaquin [Levofloxacin In D5w]   . Levofloxacin   . Meperidine Hcl   . Morphine   . Morphine And Related   . Other     arythromycin  . Penicillin G   . Penicillins     REACTION: Rash and facial swelling at age 74    ROS Review of Systems  Constitutional: Negative for chills and fever.  HENT: Negative for congestion and sore throat.   Eyes: Negative for pain and discharge.  Respiratory: Negative for cough and shortness of breath.   Cardiovascular: Negative for chest pain and palpitations.  Gastrointestinal: Negative for constipation, diarrhea, nausea and vomiting.  Endocrine: Negative for polydipsia and polyuria.  Genitourinary: Negative for dysuria and hematuria.  Musculoskeletal: Positive for myalgias. Negative for neck pain and neck stiffness.  Skin: Negative for rash.  Neurological:  Negative for dizziness, weakness, numbness and headaches.  Psychiatric/Behavioral: Positive for decreased concentration and sleep disturbance. Negative for agitation and behavioral problems. The patient is nervous/anxious and is hyperactive.       Objective:    Physical Exam Vitals reviewed.  Constitutional:      General: He is not in acute distress.    Appearance: He is not diaphoretic.  HENT:     Head: Normocephalic.     Nose: Nose normal.     Mouth/Throat:     Mouth: Mucous membranes are moist.  Eyes:     General: No scleral icterus.    Extraocular Movements: Extraocular movements intact.     Pupils: Pupils are equal, round,  and reactive to light.  Cardiovascular:     Rate and Rhythm: Normal rate and regular rhythm.     Pulses: Normal pulses.     Heart sounds: No murmur heard.   Pulmonary:     Breath sounds: Normal breath sounds. No wheezing or rales.  Abdominal:     Palpations: Abdomen is soft.     Tenderness: There is no abdominal tenderness.  Musculoskeletal:     Cervical back: Neck supple. No tenderness.     Right lower leg: No edema.     Left lower leg: No edema.  Skin:    General: Skin is warm.     Findings: No rash.  Neurological:     General: No focal deficit present.     Mental Status: He is alert and oriented to person, place, and time.     Sensory: No sensory deficit.     Motor: No weakness.  Psychiatric:        Mood and Affect: Mood is anxious.        Speech: Speech is tangential.        Behavior: Behavior is hyperactive.        Thought Content: Thought content is delusional. Thought content does not include homicidal or suicidal ideation.     BP (!) 141/92 (BP Location: Right Arm, Patient Position: Sitting, Cuff Size: Large)   Pulse 81   Temp 97.6 F (36.4 C) (Temporal)   Ht $R'5\' 11"'jk$  (1.803 m)   Wt 231 lb (104.8 kg)   SpO2 100%   BMI 32.22 kg/m  Wt Readings from Last 3 Encounters:  05/27/21 231 lb (104.8 kg)  05/07/21 222 lb (100.7 kg)   04/07/21 201 lb (91.2 kg)     Health Maintenance Due  Topic Date Due  . COLONOSCOPY (Pts 45-76yrs Insurance coverage will need to be confirmed)  Never done    There are no preventive care reminders to display for this patient.  Lab Results  Component Value Date   TSH 1.999 04/07/2021   Lab Results  Component Value Date   WBC 8.3 04/19/2021   HGB 13.1 04/19/2021   HCT 37.4 (L) 04/19/2021   MCV 85.6 04/19/2021   PLT 176 04/19/2021   Lab Results  Component Value Date   NA 136 04/06/2021   K 4.2 04/06/2021   CO2 23 04/06/2021   GLUCOSE 112 (H) 04/06/2021   BUN 14 04/06/2021   CREATININE 0.88 04/06/2021   BILITOT 0.1 (L) 04/19/2021   ALKPHOS 56 04/19/2021   AST 23 04/19/2021   ALT 22 04/19/2021   PROT 6.6 04/19/2021   ALBUMIN 3.8 04/19/2021   CALCIUM 9.1 04/06/2021   ANIONGAP 13 04/06/2021   EGFR 96 03/04/2021   Lab Results  Component Value Date   CHOL 219 (H) 04/09/2021   Lab Results  Component Value Date   HDL 52 04/09/2021   Lab Results  Component Value Date   LDLCALC 135 (H) 04/09/2021   Lab Results  Component Value Date   TRIG 159 (H) 04/09/2021   Lab Results  Component Value Date   CHOLHDL 4.2 04/09/2021   Lab Results  Component Value Date   HGBA1C 5.4 04/07/2021      Assessment & Plan:   Problem List Items Addressed This Visit      Cardiovascular and Mediastinum   HTN (hypertension)    BP Readings from Last 1 Encounters:  05/27/21 (!) 141/92   Uncontrolled due to noncompliance Need to start taking Telmisartan Counseled  for compliance with the medications Advised DASH diet and moderate exercise/walking, at least 150 mins/week       Relevant Orders   CBC   CMP14+EGFR   Lipid panel   Hemoglobin A1c   TSH     Other   Colostomy in place [facility])    Had perforated rectum due to foreign body, s/p partial colectomy Currently has colostomy in place, saw General surgeon - needs colonoscopy before proceeding for colostomy  reversal Could not find GI who was willing to perform colonoscopy for him considering his Psychiatric and social history, will make GI referral later once his Psychiatric condition is stable      Chronic pain syndrome    Refilled Gabapentin for now Will discuss with Psychiatry with appropriate dosing for him for his Psych history Referred to pain clinic      Relevant Medications   gabapentin (NEURONTIN) 300 MG capsule   Other Relevant Orders   Ambulatory referral to Pain Clinic   Bipolar affective disorder ([place]) - Primary    Recent inpatient Psychiatric treatment for acute mania Was discharged Tegetrol 200 mg BID, Resperidone 3 mg QAM and 4 mg QPM and Gabapentin 600 mg in AM and 800 mg before lunch and dinner Atarax PRN for anxiety Now on Risperidone 4 mg qHS. Explained to the patient about importance of compliance to the treatment. Follow up with [facility] therapist and Psychiatry Will discuss with Psychiatry about Gabapentin dose      Relevant Orders   TSH      Meds ordered this encounter  Medications  . gabapentin (NEURONTIN) 300 MG capsule    Sig: Take 2 capsules (600 mg total) by mouth daily with breakfast.    Dispense:  60 capsule    Refill:  0    Follow-up: Return in about 4 months (around 09/26/2021) for HTN.    Lindell Spar, MD

## 2021-05-28 ENCOUNTER — Ambulatory Visit: Payer: PPO | Admitting: Internal Medicine

## 2021-06-01 DIAGNOSIS — I10 Essential (primary) hypertension: Secondary | ICD-10-CM | POA: Diagnosis not present

## 2021-06-01 DIAGNOSIS — F312 Bipolar disorder, current episode manic severe with psychotic features: Secondary | ICD-10-CM | POA: Diagnosis not present

## 2021-06-02 LAB — CBC
Hematocrit: 44.1 % (ref 37.5–51.0)
Hemoglobin: 15.2 g/dL (ref 13.0–17.7)
MCH: 29.5 pg (ref 26.6–33.0)
MCHC: 34.5 g/dL (ref 31.5–35.7)
MCV: 86 fL (ref 79–97)
Platelets: 210 10*3/uL (ref 150–450)
RBC: 5.15 x10E6/uL (ref 4.14–5.80)
RDW: 12.9 % (ref 11.6–15.4)
WBC: 8.5 10*3/uL (ref 3.4–10.8)

## 2021-06-02 LAB — TSH: TSH: 1.47 u[IU]/mL (ref 0.450–4.500)

## 2021-06-02 LAB — CMP14+EGFR
ALT: 16 IU/L (ref 0–44)
AST: 13 IU/L (ref 0–40)
Albumin/Globulin Ratio: 1.9 (ref 1.2–2.2)
Albumin: 4.2 g/dL (ref 3.8–4.9)
Alkaline Phosphatase: 93 IU/L (ref 44–121)
BUN/Creatinine Ratio: 9 (ref 9–20)
BUN: 8 mg/dL (ref 6–24)
Bilirubin Total: <0.2 mg/dL (ref 0.0–1.2)
CO2: 25 mmol/L (ref 20–29)
Calcium: 9 mg/dL (ref 8.7–10.2)
Chloride: 99 mmol/L (ref 96–106)
Creatinine, Ser: 0.94 mg/dL (ref 0.76–1.27)
Globulin, Total: 2.2 g/dL (ref 1.5–4.5)
Glucose: 110 mg/dL — ABNORMAL HIGH (ref 65–99)
Potassium: 4.7 mmol/L (ref 3.5–5.2)
Sodium: 137 mmol/L (ref 134–144)
Total Protein: 6.4 g/dL (ref 6.0–8.5)
eGFR: 96 mL/min/{1.73_m2} (ref >59)

## 2021-06-02 LAB — LIPID PANEL
Chol/HDL Ratio: 3.8 ratio (ref 0.0–5.0)
Cholesterol, Total: 180 mg/dL (ref 100–199)
HDL: 48 mg/dL (ref >39)
LDL Chol Calc (NIH): 92 mg/dL (ref 0–99)
Triglycerides: 235 mg/dL — ABNORMAL HIGH (ref 0–149)
VLDL Cholesterol Cal: 40 mg/dL (ref 5–40)

## 2021-06-02 LAB — HEMOGLOBIN A1C
Est. average glucose Bld gHb Est-mCnc: 117 mg/dL
Hgb A1c MFr Bld: 5.7 % — ABNORMAL HIGH (ref 4.8–5.6)

## 2021-06-19 ENCOUNTER — Telehealth (HOSPITAL_COMMUNITY): Payer: Self-pay | Admitting: Psychiatry

## 2021-06-19 NOTE — Telephone Encounter (Signed)
Per pt-  The chewable tablets are making him jerky.  He would like to change this medication.   Please advise.   CB# [phone]

## 2021-06-24 DIAGNOSIS — K9400 Colostomy complication, unspecified: Secondary | ICD-10-CM | POA: Diagnosis not present

## 2021-06-25 ENCOUNTER — Other Ambulatory Visit (HOSPITAL_COMMUNITY): Payer: Self-pay | Admitting: Psychiatry

## 2021-06-25 ENCOUNTER — Other Ambulatory Visit: Payer: Self-pay | Admitting: Internal Medicine

## 2021-06-25 DIAGNOSIS — F419 Anxiety disorder, unspecified: Secondary | ICD-10-CM

## 2021-06-25 DIAGNOSIS — G894 Chronic pain syndrome: Secondary | ICD-10-CM

## 2021-07-07 ENCOUNTER — Ambulatory Visit: Payer: PPO | Admitting: Internal Medicine

## 2021-07-08 ENCOUNTER — Ambulatory Visit: Payer: PPO | Admitting: Internal Medicine

## 2021-07-09 ENCOUNTER — Encounter (HOSPITAL_COMMUNITY): Payer: Self-pay | Admitting: Psychiatry

## 2021-07-09 ENCOUNTER — Other Ambulatory Visit: Payer: Self-pay | Admitting: Internal Medicine

## 2021-07-09 ENCOUNTER — Ambulatory Visit (INDEPENDENT_AMBULATORY_CARE_PROVIDER_SITE_OTHER): Payer: PPO | Admitting: Psychiatry

## 2021-07-09 ENCOUNTER — Telehealth (HOSPITAL_COMMUNITY): Payer: PPO | Admitting: Psychiatry

## 2021-07-09 VITALS — BP 140/90 | Temp 98.7°F | Ht 71.0 in | Wt 221.0 lb

## 2021-07-09 DIAGNOSIS — G894 Chronic pain syndrome: Secondary | ICD-10-CM

## 2021-07-09 DIAGNOSIS — F419 Anxiety disorder, unspecified: Secondary | ICD-10-CM

## 2021-07-09 DIAGNOSIS — F5102 Adjustment insomnia: Secondary | ICD-10-CM

## 2021-07-09 DIAGNOSIS — F3112 Bipolar disorder, current episode manic without psychotic features, moderate: Secondary | ICD-10-CM | POA: Diagnosis not present

## 2021-07-09 MED ORDER — RISPERIDONE 4 MG PO TABS: ORAL_TABLET | ORAL | 1 refills | Status: DC

## 2021-07-09 MED ORDER — TRAZODONE HCL 100 MG PO TABS: ORAL_TABLET | ORAL | 1 refills | Status: DC

## 2021-07-09 MED ORDER — CARBAMAZEPINE 100 MG PO CHEW: CHEWABLE_TABLET | ORAL | 1 refills | Status: DC

## 2021-07-09 MED ORDER — HYDROXYZINE HCL 25 MG PO TABS: ORAL_TABLET | ORAL | 1 refills | Status: DC

## 2021-07-09 NOTE — Progress Notes (Addendum)
[facility] Followup visit   Patient Identification: [name] [name] MRN:  [id number] Date of Evaluation:  07/09/2021 Referral Source: [facility] Discharge Chief Complaint:  establish care, bipolar  Visit Diagnosis:    ICD-10-CM   1. Bipolar disorder, curr episode manic w/o psychotic features, moderate (HCC)  F31.12     2. Anxiety  F41.9 hydrOXYzine (ATARAX/VISTARIL) 25 MG tablet    3. Adjustment insomnia  F51.02        History of Present Illness:  Patient initially referred from discharge during April admission Admitted with mania and psychosis, in past , non compliance and alcohol use  His klonopine and ambien were stopped   On evaluation today doing fair, on risperdal 4mg  at night  Gabapentin is being prescribed by primary care physician for pain Feels jerky at night with tegretol  during the day does better   Discussed compliance More clear and less hyperverbal today    Aggravating factors; noncompliance, medical comorbidity on colostomy bag Modifying factors; mother,  Duration most of his adult life  Compliance poor as according to past history and notes reviewed  Past Psychiatric History: bipolar disorder  Previous Psychotropic Medications: Yes   Substance Abuse History in the last 12 months:  No.  Consequences of Substance Abuse: not gives clear history of using drugs or minimizes use  Past Medical History:  Past Medical History:  Diagnosis Date   Anxiety    Bipolar 1 disorder (HCC)    Chronic fatigue    Chronic pain    Complete intestinal obstruction (HCC)    Depression    Phreesia 12/03/2020   Fecal peritonitis (HCC) 08/03/2019   Fibromyalgia    Hyperlipidemia    Phreesia 12/03/2020   Ileus, postoperative (HCC)    Perforated rectum (HCC) 08/03/2019    Past Surgical History:  Procedure Laterality Date   COLON RESECTION SIGMOID  08/03/2019   Procedure: COLON RESECTION SIGMOID;  Surgeon: 10/03/2019, MD;  Location: [facility];  Service: General;;   COLOSTOMY N/A  08/03/2019   Procedure: COLOSTOMY;  Surgeon: 10/03/2019, MD;  Location: [facility];  Service: General;  Laterality: N/A;   FLEXIBLE SIGMOIDOSCOPY N/A 08/03/2019   Procedure: FLEXIBLE SIGMOIDOSCOPY;  Surgeon: 10/03/2019, MD;  [facility];  Service: Endoscopy;  Laterality: N/A;   KNEE ARTHROSCOPY Left 2006   miniscus tear   LAPAROTOMY N/A 08/03/2019   Procedure: EXPLORATORY LAPAROTOMY;  Surgeon: 10/03/2019, MD;  Location: [facility];  Service: General;  Laterality: N/A;   LYMPH GLAND EXCISION Left 1983   SMALL INTESTINE SURGERY N/A    Phreesia 12/03/2020   TONSILLECTOMY      Family Psychiatric History: denies  Family History:  Family History  Problem Relation Age of Onset   Diabetes Mother    Heart attack Father    Diabetes Maternal Grandmother    Colon cancer Neg Hx    Colon polyps Neg Hx     Social History:   Social History   Socioeconomic History   Marital status: Legally Separated    Spouse name: Not on file   Number of children: Not on file   Years of education: Not on file   Highest education level: Not on file  Occupational History   Not on file  Tobacco Use   Smoking status: Every Day    Packs/day: 1.00    Years: 10.00    Pack years: 10.00    Types: Cigarettes   Smokeless tobacco: Never  Vaping Use  Vaping Use: Some days  Substance and Sexual Activity   Alcohol use: Not Currently    Comment: occasional; denied 10/02/19   Drug use: Not Currently    Types: Cocaine    Comment: last used in December 2020.    Sexual activity: Not Currently  Other Topics Concern   Not on file  Social History Narrative   Pt lives alone and is on disability. Counts his mother, sister and aunt as his social supports.    Social Determinants of Health   Financial Resource Strain: Not on file  Food Insecurity: Not on file  Transportation Needs: Not on file  Physical Activity: Not on file  Stress: Not on file  Social Connections: Not on file      Allergies:   Allergies  Allergen Reactions   Codeine Shortness Of Breath and Swelling   Divalproex Sodium Diarrhea, Itching, Rash, Shortness Of Breath and Swelling   Erythromycin Hives, Itching, Rash and Swelling   Iodinated Diagnostic Agents Swelling   Latex Itching and Rash   Sulfa Antibiotics Diarrhea, Itching, Rash, Shortness Of Breath and Swelling   Wheat Bran Hives, Itching, Rash, Shortness Of Breath and Swelling   Azithromycin    Demerol [Meperidine]    Levaquin [Levofloxacin In D5w]    Levofloxacin    Meperidine Hcl    Morphine    Morphine And Related    Other     arythromycin   Penicillin G    Penicillins     REACTION: Rash and facial swelling at age 49    Metabolic Disorder Labs: Lab Results  Component Value Date   HGBA1C 5.7 (H) 06/01/2021   MPG 108.28 04/07/2021   No results found for: PROLACTIN Lab Results  Component Value Date   CHOL 180 06/01/2021   TRIG 235 (H) 06/01/2021   HDL 48 06/01/2021   CHOLHDL 3.8 06/01/2021   VLDL 32 04/09/2021   LDLCALC 92 06/01/2021   LDLCALC 135 (H) 04/09/2021   Lab Results  Component Value Date   TSH 1.470 06/01/2021    Therapeutic Level Labs: Lab Results  Component Value Date   LITHIUM <0.06 (L) 09/20/2019   Lab Results  Component Value Date   CBMZ 6.7 04/19/2021   No results found for: VALPROATE  Current Medications: Current Outpatient Medications  Medication Sig Dispense Refill   carbamazepine (TEGRETOL) 100 MG chewable tablet Take one a day, generic 30 tablet 1   gabapentin (NEURONTIN) 300 MG capsule TAKE 2 CAPSULES BY MOUTH ONCE DAILY WITH BREAKFAST. 60 capsule 0   gabapentin (NEURONTIN) 400 MG capsule Take 2 capsules (800 mg total) by mouth 2 (two) times daily before lunch and supper. 120 capsule 0   hydrOXYzine (ATARAX/VISTARIL) 25 MG tablet TAKE (1) TABLET BY MOUTH TWICE DAILY AS NEEDED FOR ANXIETY. 60 tablet 1   meloxicam (MOBIC) 7.5 MG tablet Take 2 tablets (15 mg total) by mouth daily. 30  tablet 0   risperidone (RISPERDAL) 4 MG tablet TAKE (1) TABLET BY MOUTH AT BEDTIME. 30 tablet 1   telmisartan (MICARDIS) 20 MG tablet Take 1 tablet (20 mg total) by mouth daily. 30 tablet 2   traZODone (DESYREL) 100 MG tablet TAKE (1) TABLET BY MOUTH ONCE AT BEDTIME. 30 tablet 1   No current facility-administered medications for this visit.      Psychiatric Specialty Exam: Review of Systems  Cardiovascular:  Negative for chest pain.  Psychiatric/Behavioral:  Negative for self-injury.    Blood pressure 140/90, temperature 98.7 F (37.1 C), height  5\' 11"  (1.803 m), weight 221 lb (100.2 kg).Body mass index is 30.82 kg/m.  General Appearance: Casual  Eye Contact:  Fair  Speech:   somewhat rambling  Volume:  Normal  Mood:  Euthymic  Affect:  Full Range  Thought Process:  Coherent  Orientation:  Full (Time, Place, and Person)  Thought Content:  Rumination  Suicidal Thoughts:  No  Homicidal Thoughts:  No  Memory:  Immediate;   Fair Recent;   Fair  Judgement:  Other:  limited  Insight:  Shallow  Psychomotor Activity:  Normal  Concentration:  Concentration: Fair and Attention Span: Fair  Recall:  of Knowledge:Fair  Language: Fair  Akathisia:  No  Handed:    AIMS (if indicated):no involuntary movements noteiceable  Assets:  Desire for Improvement  ADL's:  Intact  Cognition: WNL  Sleep:  Fair   Screenings: AIMS    Flowsheet Row [facility]  AIMS Total Score 0      AUDIT    Flowsheet Row Admission (Discharged[facility]  Alcohol Use Disorder Identification Test Final Score (AUDIT) 0      GAD-7    Flowsheet Row Counselor from [facility]05/2021 [facility]  Total GAD-7 Score 4      PHQ2-9    Flowsheet Row Office Visit from 05/27/2021 in [facility] Most recent reading at  05/27/2021  2:51 PM Video Visit from 05/27/2021 in [facility] [place] Most recent reading at 05/27/2021  1:52 PM Office Visit from 05/07/2021 in [facility] Most recent reading at 05/07/2021  9:37 AM Counselor from 05/05/2021 in [facility] Most recent reading at 05/05/2021  4:18 PM Office Visit from 03/04/2021 in [facility] Most recent reading at 03/04/2021  8:54 AM  PHQ-2 Total Score 1 1 0 1 0  PHQ-9 Total Score -- 6 -- 6 --      Flowsheet Row Office Visit from 07/09/2021 in [facility] [place] Video Visit from 05/27/2021 in [facility] [place] Counselor from 05/05/2021 in [facility]  C-SSRS RISK CATEGORY No Risk Error: Question 6 not populated No Risk       Assessment and Plan: as follows  Bipolar current episode manic with psychosis: partial remission  Doing fair more clear continue Resporal 4 mg at night Tegretol only during the daytime as he feels directly with the night Tegretol.  He is also on trazodone that helps sleep continue hydroxyzine for anxiety we are avoiding any benzodiazepine  Adjustment insomnia reviewed sleep hygiene  manageable on trazodone  Anxiety disorder unspecified continue hydroxyzine Abstain from drugs and alcohol discussed compliance Follow-up in 2 months or earlier if needed Follow-up with Dr. 07/05/2021 regarding to gabapentin dose if she needs to adjust or increase in reference to pain Face to face time spent: 20 min including documentation  Rejeana Brock, MD 7/14/20222:14 PM

## 2021-07-17 ENCOUNTER — Ambulatory Visit (INDEPENDENT_AMBULATORY_CARE_PROVIDER_SITE_OTHER): Payer: PPO | Admitting: Family Medicine

## 2021-07-17 ENCOUNTER — Other Ambulatory Visit: Payer: Self-pay

## 2021-07-17 ENCOUNTER — Encounter: Payer: Self-pay | Admitting: Family Medicine

## 2021-07-17 ENCOUNTER — Ambulatory Visit (HOSPITAL_COMMUNITY)
Admission: RE | Admit: 2021-07-17 | Discharge: 2021-07-17 | Disposition: A | Discharge status: Home / Self Care | Payer: PPO | Source: Ambulatory Visit | Attending: Family Medicine | Admitting: Family Medicine

## 2021-07-17 VITALS — BP 137/88 | HR 94 | Temp 98.3°F | Resp 18 | Ht 71.0 in | Wt 225.0 lb

## 2021-07-17 DIAGNOSIS — F17210 Nicotine dependence, cigarettes, uncomplicated: Secondary | ICD-10-CM | POA: Diagnosis not present

## 2021-07-17 DIAGNOSIS — Z720 Tobacco use: Secondary | ICD-10-CM | POA: Diagnosis not present

## 2021-07-17 DIAGNOSIS — F419 Anxiety disorder, unspecified: Secondary | ICD-10-CM | POA: Diagnosis not present

## 2021-07-17 DIAGNOSIS — G894 Chronic pain syndrome: Secondary | ICD-10-CM

## 2021-07-17 DIAGNOSIS — M1711 Unilateral primary osteoarthritis, right knee: Secondary | ICD-10-CM | POA: Insufficient documentation

## 2021-07-17 DIAGNOSIS — M25561 Pain in right knee: Secondary | ICD-10-CM

## 2021-07-17 DIAGNOSIS — I10 Essential (primary) hypertension: Secondary | ICD-10-CM

## 2021-07-17 DIAGNOSIS — M25461 Effusion, right knee: Secondary | ICD-10-CM | POA: Diagnosis not present

## 2021-07-17 MED ORDER — GABAPENTIN 400 MG PO CAPS: 400.0000 mg | ORAL_CAPSULE | Freq: Two times a day (BID) | ORAL | 0 refills | Status: DC

## 2021-07-17 NOTE — Assessment & Plan Note (Signed)
Controlled, no change in medication  

## 2021-07-17 NOTE — Progress Notes (Signed)
   [name] [name]     MRN: [id number]      DOB: Jan 27, 1966  Requesting improved management of his chronic pain, specifically asking for increase in gabapentin dose as he feels he had better control of his neuropathic pain before. He is a;lso very willing and interested in pain m,anagement which is very appropriate, so referral ios also entered. C/o rigth knee p[ain localized following direct trauma 4 weeks ago. Able to walk  but has localized tenderness  ROS Denies recent fever or chills. Denies sinus pressure, nasal congestion, ear pain or sore throat. Denies chest congestion, productive cough or wheezing. Denies chest pains, palpitations and leg swelling Denies uncontrolled depression, c/o anxiety .   PE  BP 137/88 (BP Location: Right Arm, Patient Position: Sitting, Cuff Size: Large)   Pulse 94   Temp 98.3 F (36.8 C)   Resp 18   Ht 5\' 11"  (1.803 m)   Wt 225 lb (102.1 kg)   SpO2 96%   BMI 31.38 kg/m   Patient alert and oriented and in no cardiopulmonary distress.  HEENT: No facial asymmetry, EOMI,     Neck supple .  Chest: Clear to auscultation bilaterally.  CVS: S1, S2 no murmurs, no S3.Regular rate. Abd: functional colostomy.   Ext: No edema  MS: Adequate ROM spine, shoulders, hips and tender over medial aspect of right knees  Skin: Intact, no ulcerations or rash noted.  Psych: anxious with , tangential speech CNS: CN 2-12 intact, power,  normal throughout.no focal deficits noted.   Assessment & Plan  Chronic pain syndrome Reports uncontrolled, refer pain clinic, inc gabapentin x 1 month only  Tobacco abuse Asked:confirms currently smokes cigarettes 1 PPD Assess: Unwilling to set a quit date, but is cutting back Advise: needs to QUIT to reduce risk of cancer, cardio and cerebrovascular disease Assist: counseled for 5 minutes and literature provided Arrange: follow up in 2 to 4 months   Anxiety Uncontrolled , treated by Psych  HTN (hypertension) Controlled,  no change in medication   Medial knee pain, right Trauma 4 weeks ago with localized tenderness, x ray of knee

## 2021-07-17 NOTE — Assessment & Plan Note (Addendum)
Asked:confirms currently smokes cigarettes 1 PPD Assess: Unwilling to set a quit date, but is cutting back Advise: needs to QUIT to reduce risk of cancer, cardio and cerebrovascular disease Assist: counseled for 5 minutes and literature provided Arrange: follow up in 2 to 4 months  

## 2021-07-17 NOTE — Patient Instructions (Signed)
Follow-up with Dr. Allena Katz call if you need a sooner.  New higher dose of gabapentin for 1 month only while you wait to be seen by the pain clinic 400 mg 1 twice daily.  You are referred to Dr. Gerilyn Pilgrim for management of chronic pain.  Please get an x-ray of your right knee today because of pain following trauma the order is entered and you can go straight to the x-ray department.  Please work on cutting back on smoking.  I understand you smoke because of your nerves however it is important to work on cutting back with the hope of quitting to reduce your risk of other chronic illness.  Thanks for choosing [facility], we consider it a privelige to serve you.

## 2021-07-17 NOTE — Assessment & Plan Note (Signed)
Reports uncontrolled, refer pain clinic, inc gabapentin x 1 month only

## 2021-07-17 NOTE — Assessment & Plan Note (Signed)
Trauma 4 weeks ago with localized tenderness, x ray of knee

## 2021-07-17 NOTE — Assessment & Plan Note (Signed)
Uncontrolled , treated by Psych

## 2021-07-31 DIAGNOSIS — F419 Anxiety disorder, unspecified: Secondary | ICD-10-CM | POA: Diagnosis not present

## 2021-07-31 DIAGNOSIS — G894 Chronic pain syndrome: Secondary | ICD-10-CM | POA: Diagnosis not present

## 2021-07-31 DIAGNOSIS — M25561 Pain in right knee: Secondary | ICD-10-CM | POA: Diagnosis not present

## 2021-07-31 DIAGNOSIS — M797 Fibromyalgia: Secondary | ICD-10-CM | POA: Diagnosis not present

## 2021-07-31 DIAGNOSIS — I10 Essential (primary) hypertension: Secondary | ICD-10-CM | POA: Diagnosis not present

## 2021-07-31 DIAGNOSIS — Z79891 Long term (current) use of opiate analgesic: Secondary | ICD-10-CM | POA: Diagnosis not present

## 2021-08-10 ENCOUNTER — Other Ambulatory Visit (HOSPITAL_COMMUNITY): Payer: Self-pay | Admitting: Psychiatry

## 2021-08-10 DIAGNOSIS — K9400 Colostomy complication, unspecified: Secondary | ICD-10-CM | POA: Diagnosis not present

## 2021-08-27 DIAGNOSIS — I10 Essential (primary) hypertension: Secondary | ICD-10-CM | POA: Diagnosis not present

## 2021-08-27 DIAGNOSIS — M25561 Pain in right knee: Secondary | ICD-10-CM | POA: Diagnosis not present

## 2021-08-27 DIAGNOSIS — G894 Chronic pain syndrome: Secondary | ICD-10-CM | POA: Diagnosis not present

## 2021-08-27 DIAGNOSIS — M797 Fibromyalgia: Secondary | ICD-10-CM | POA: Diagnosis not present

## 2021-08-27 DIAGNOSIS — F419 Anxiety disorder, unspecified: Secondary | ICD-10-CM | POA: Diagnosis not present

## 2021-09-01 ENCOUNTER — Telehealth: Payer: Self-pay

## 2021-09-01 ENCOUNTER — Other Ambulatory Visit: Payer: Self-pay | Admitting: *Deleted

## 2021-09-01 DIAGNOSIS — I10 Essential (primary) hypertension: Secondary | ICD-10-CM

## 2021-09-01 MED ORDER — TELMISARTAN 20 MG PO TABS: 20.0000 mg | ORAL_TABLET | Freq: Every day | ORAL | 2 refills | Status: DC

## 2021-09-01 NOTE — Telephone Encounter (Signed)
Pt medication sent to pharmacy  

## 2021-09-01 NOTE — Telephone Encounter (Signed)
Patient need med refill  telmisartan (MICARDIS) 20 MG tablet   Pharmacy: [facility]

## 2021-09-02 ENCOUNTER — Telehealth (HOSPITAL_COMMUNITY): Payer: Self-pay | Admitting: Psychiatry

## 2021-09-02 NOTE — Telephone Encounter (Signed)
Pt called the carbamazepine (TEGRETOL) 100 MG chewable tablet  is making him tremble and he wants to switch the meds.

## 2021-09-03 NOTE — Telephone Encounter (Signed)
Informed patient to stop taking Tegretol per Dr. Gilmore Laroche and continue with risperidone. He says risperidone helps. He stated his understanding of everything. Nothing further is needed.

## 2021-09-09 ENCOUNTER — Encounter: Payer: Self-pay | Admitting: Orthopaedic Surgery

## 2021-09-09 ENCOUNTER — Other Ambulatory Visit: Payer: Self-pay

## 2021-09-09 ENCOUNTER — Ambulatory Visit (INDEPENDENT_AMBULATORY_CARE_PROVIDER_SITE_OTHER): Payer: PPO | Admitting: Orthopaedic Surgery

## 2021-09-09 ENCOUNTER — Other Ambulatory Visit: Payer: Self-pay | Admitting: Orthopaedic Surgery

## 2021-09-09 VITALS — Ht 71.0 in | Wt 225.0 lb

## 2021-09-09 DIAGNOSIS — M25561 Pain in right knee: Secondary | ICD-10-CM | POA: Diagnosis not present

## 2021-09-09 DIAGNOSIS — G8929 Other chronic pain: Secondary | ICD-10-CM | POA: Diagnosis not present

## 2021-09-09 NOTE — Progress Notes (Signed)
Subjective:    Patient ID: [name] [name], male    DOB: December 02, 1966, 55 y.o.   MRN: [id number]  HPI He fell in April and twisted his right knee.  He hit the knee medially.  The knee has continued to hurt and bother him.  He has tried ice, heat, Advil with no help.  He had X-rays done in July which showed: IMPRESSION: Tricompartmental degenerative changes with significant loss of joint space in the medial compartment. Small joint effusion. No other abnormalities. Specifically, no fractures identified.  I have independently reviewed and interpreted x-rays of this patient done at another site by another physician or qualified health professional.   He has been seen by Dr. Allena Katz.  I have reviewed the notes.  He has swelling and popping and more recently giving way.  He has some pain of the upper lateral calf at times.  He has no swelling of the leg or ankle.  He has no new trauma.   Review of Systems  Constitutional:  Positive for activity change.  Musculoskeletal:  Positive for arthralgias, gait problem and joint swelling.  Psychiatric/Behavioral:  The patient is nervous/anxious.   All other systems reviewed and are negative. For Review of Systems, all other systems reviewed and are negative.  The following is a summary of the past history medically, past history surgically, known current medicines, social history and family history.  This information is gathered electronically by the computer from prior information and documentation.  I review this each visit and have found including this information at this point in the chart is beneficial and informative.   Past Medical History:  Diagnosis Date   Anxiety    Bipolar 1 disorder (HCC)    Chronic fatigue    Chronic pain    Complete intestinal obstruction (HCC)    Depression    Phreesia 12/03/2020   Fecal peritonitis (HCC) 08/03/2019   Fibromyalgia    Hyperlipidemia    Phreesia 12/03/2020   Ileus, postoperative (HCC)    Perforated  rectum (HCC) 08/03/2019    Past Surgical History:  Procedure Laterality Date   COLON RESECTION SIGMOID  08/03/2019   Procedure: COLON RESECTION SIGMOID;  Surgeon: Lucretia Roers, MD;  Location: [facility];  Service: General;;   COLOSTOMY N/A 08/03/2019   Procedure: COLOSTOMY;  Surgeon: Lucretia Roers, MD;  Location: [facility];  Service: General;  Laterality: N/A;   FLEXIBLE SIGMOIDOSCOPY N/A 08/03/2019   Procedure: FLEXIBLE SIGMOIDOSCOPY;  Surgeon: Corbin Ade, MD;  Location: [facility];  Service: Endoscopy;  Laterality: N/A;   KNEE ARTHROSCOPY Left 2006   miniscus tear   LAPAROTOMY N/A 08/03/2019   Procedure: EXPLORATORY LAPAROTOMY;  Surgeon: Lucretia Roers, MD;  Location: [facility];  Service: General;  Laterality: N/A;   LYMPH GLAND EXCISION Left 1983   SMALL INTESTINE SURGERY N/A    Phreesia 12/03/2020   TONSILLECTOMY      Current Outpatient Medications on File Prior to Visit  Medication Sig Dispense Refill   carbamazepine (TEGRETOL) 100 MG chewable tablet Take one a day, generic 30 tablet 1   gabapentin (NEURONTIN) 400 MG capsule Take 1 capsule (400 mg total) by mouth 2 (two) times daily. 60 capsule 0   hydrOXYzine (ATARAX/VISTARIL) 25 MG tablet TAKE (1) TABLET BY MOUTH TWICE DAILY AS NEEDED FOR ANXIETY. 60 tablet 1   meloxicam (MOBIC) 7.5 MG tablet Take 2 tablets (15 mg total) by mouth daily. 30 tablet 0   risperidone (RISPERDAL) 4 MG tablet  TAKE (1) TABLET BY MOUTH AT BEDTIME. 30 tablet 1   telmisartan (MICARDIS) 20 MG tablet Take 1 tablet (20 mg total) by mouth daily. 30 tablet 2   traZODone (DESYREL) 100 MG tablet TAKE (1) TABLET BY MOUTH ONCE AT BEDTIME. 30 tablet 1   No current facility-administered medications on file prior to visit.    Social History   Socioeconomic History   Marital status: Legally Separated    Spouse name: Not on file   Number of children: Not on file   Years of education: Not on file   Highest education level: Not on file  Occupational History    Not on file  Tobacco Use   Smoking status: Every Day    Packs/day: 1.00    Years: 10.00    Pack years: 10.00    Types: Cigarettes   Smokeless tobacco: Never  Vaping Use   Vaping Use: Some days  Substance and Sexual Activity   Alcohol use: Not Currently    Comment: occasional; denied 10/02/19   Drug use: Not Currently    Types: Cocaine    Comment: last used in December 2020.    Sexual activity: Not Currently  Other Topics Concern   Not on file  Social History Narrative   Pt lives alone and is on disability. Counts his mother, sister and aunt as his social supports.    Social Determinants of Health   Financial Resource Strain: Not on file  Food Insecurity: Not on file  Transportation Needs: Not on file  Physical Activity: Not on file  Stress: Not on file  Social Connections: Not on file  Intimate Partner Violence: Not on file    Family History  Problem Relation Age of Onset   Diabetes Mother    Heart attack Father    Diabetes Maternal Grandmother    Colon cancer Neg Hx    Colon polyps Neg Hx     Ht 5\' 11"  (1.803 m)   Wt 225 lb (102.1 kg)   BMI 31.38 kg/m   Body mass index is 31.38 kg/m.     Objective:   Physical Exam Vitals reviewed. Exam conducted with a chaperone present.  Constitutional:      Appearance: He is well-developed.  HENT:     Head: Normocephalic and atraumatic.  Eyes:     Conjunctiva/sclera: Conjunctivae normal.     Pupils: Pupils are equal, round, and reactive to light.  Cardiovascular:     Rate and Rhythm: Normal rate and regular rhythm.  Pulmonary:     Effort: Pulmonary effort is normal.  Abdominal:     Palpations: Abdomen is soft.  Musculoskeletal:     Cervical back: Normal range of motion and neck supple.       Legs:  Skin:    General: Skin is warm and dry.  Neurological:     Mental Status: He is alert and oriented to person, place, and time.     Cranial Nerves: No cranial nerve deficit.     Motor: No abnormal muscle tone.      Coordination: Coordination normal.     Deep Tendon Reflexes: Reflexes are normal and symmetric. Reflexes normal.  Psychiatric:        Behavior: Behavior normal.        Thought Content: Thought content normal.        Judgment: Judgment normal.          Assessment & Plan:   Encounter Diagnosis  Name Primary?   Chronic pain  of right knee Yes   I will get MRI of the right knee.  I am concerned he has a medial meniscus tear.  Return in one month.  He declines injection  I have suggested Aleve one bid pc. He is taking Advil and I told him to stop that and take the Aleve.  Stop if it bothers his stomach or GI tract.  Call if any problem.  Precautions discussed.  Electronically Signed Darreld Mclean, MD 9/14/202210:47 AM

## 2021-09-14 ENCOUNTER — Telehealth: Payer: Self-pay | Admitting: Internal Medicine

## 2021-09-14 ENCOUNTER — Other Ambulatory Visit (HOSPITAL_COMMUNITY): Payer: Self-pay | Admitting: Psychiatry

## 2021-09-14 NOTE — Telephone Encounter (Signed)
  Left message for patient to call back and schedule Medicare Annual Wellness Visit (AWV) in office.   If unable to come into the office for AWV,  please offer to do virtually or by telephone.  No hx of AWV eligible for AWVI as of 03/27/2014  Please schedule at anytime with [facility] Advisor.      40 Minutes appointment   Any questions, please call me at [phone]

## 2021-09-15 ENCOUNTER — Other Ambulatory Visit: Payer: Self-pay

## 2021-09-15 ENCOUNTER — Ambulatory Visit: Payer: PPO | Admitting: General Surgery

## 2021-09-15 ENCOUNTER — Encounter: Payer: Self-pay | Admitting: General Surgery

## 2021-09-15 VITALS — BP 132/89 | HR 84 | Temp 99.1°F | Resp 18 | Ht 71.0 in | Wt 232.0 lb

## 2021-09-15 DIAGNOSIS — R1084 Generalized abdominal pain: Secondary | ICD-10-CM

## 2021-09-15 DIAGNOSIS — Z933 Colostomy status: Secondary | ICD-10-CM

## 2021-09-15 DIAGNOSIS — K432 Incisional hernia without obstruction or gangrene: Secondary | ICD-10-CM

## 2021-09-15 MED ORDER — DIPHENHYDRAMINE HCL 50 MG PO TABS: 50.0000 mg | ORAL_TABLET | ORAL | 0 refills | Status: DC

## 2021-09-15 MED ORDER — PREDNISONE 50 MG PO TABS: ORAL_TABLET | ORAL | 0 refills | Status: DC
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
## 2021-09-15 NOTE — Patient Instructions (Addendum)
Will order CT with oral and rectal contrast, and IV contrast.  Will see you after CT to discuss results.  Do not cancel your appointment with Dr. Byrd Hesselbach.   Andrey Campanile will call you with the CT and with the follow up appointment time.

## 2021-09-16 ENCOUNTER — Ambulatory Visit: Payer: PPO | Admitting: Internal Medicine

## 2021-09-17 ENCOUNTER — Ambulatory Visit (HOSPITAL_COMMUNITY)
Admission: RE | Admit: 2021-09-17 | Discharge: 2021-09-17 | Disposition: A | Discharge status: Home / Self Care | Payer: PPO | Source: Ambulatory Visit | Attending: General Surgery | Admitting: General Surgery

## 2021-09-17 ENCOUNTER — Other Ambulatory Visit: Payer: Self-pay

## 2021-09-17 ENCOUNTER — Telehealth (HOSPITAL_COMMUNITY): Payer: PPO | Admitting: Psychiatry

## 2021-09-17 DIAGNOSIS — Z933 Colostomy status: Secondary | ICD-10-CM | POA: Diagnosis not present

## 2021-09-17 DIAGNOSIS — K439 Ventral hernia without obstruction or gangrene: Secondary | ICD-10-CM | POA: Diagnosis not present

## 2021-09-17 DIAGNOSIS — R1084 Generalized abdominal pain: Secondary | ICD-10-CM | POA: Insufficient documentation

## 2021-09-17 DIAGNOSIS — M47816 Spondylosis without myelopathy or radiculopathy, lumbar region: Secondary | ICD-10-CM | POA: Diagnosis not present

## 2021-09-17 DIAGNOSIS — K432 Incisional hernia without obstruction or gangrene: Secondary | ICD-10-CM | POA: Insufficient documentation

## 2021-09-17 DIAGNOSIS — Z9889 Other specified postprocedural states: Secondary | ICD-10-CM | POA: Diagnosis not present

## 2021-09-17 DIAGNOSIS — I700 Atherosclerosis of aorta: Secondary | ICD-10-CM | POA: Diagnosis not present

## 2021-09-17 LAB — POCT I-STAT CREATININE: Creatinine, Ser: 0.9 mg/dL (ref 0.61–1.24)

## 2021-09-17 MED ORDER — IOHEXOL 350 MG/ML SOLN
75.0000 mL | Freq: Once | INTRAVENOUS | Status: AC | PRN
  Administered 2021-09-17: 75 mL via INTRAVENOUS

## 2021-09-21 ENCOUNTER — Ambulatory Visit: Payer: PPO | Admitting: Internal Medicine

## 2021-09-22 ENCOUNTER — Other Ambulatory Visit: Payer: Self-pay

## 2021-09-22 ENCOUNTER — Ambulatory Visit (INDEPENDENT_AMBULATORY_CARE_PROVIDER_SITE_OTHER): Payer: PPO | Admitting: General Surgery

## 2021-09-22 ENCOUNTER — Encounter: Payer: Self-pay | Admitting: General Surgery

## 2021-09-22 VITALS — BP 136/90 | HR 91 | Temp 99.2°F | Resp 16 | Ht 71.0 in | Wt 235.0 lb

## 2021-09-22 DIAGNOSIS — K432 Incisional hernia without obstruction or gangrene: Secondary | ICD-10-CM

## 2021-09-22 DIAGNOSIS — R1084 Generalized abdominal pain: Secondary | ICD-10-CM | POA: Diagnosis not present

## 2021-09-22 DIAGNOSIS — J0100 Acute maxillary sinusitis, unspecified: Secondary | ICD-10-CM

## 2021-09-22 DIAGNOSIS — Z933 Colostomy status: Secondary | ICD-10-CM | POA: Diagnosis not present

## 2021-09-22 MED ORDER — AZITHROMYCIN 250 MG PO TABS: ORAL_TABLET | ORAL | 0 refills | Status: AC

## 2021-09-22 NOTE — Progress Notes (Signed)
[facility] Note   HPI:  55 y.o. Male presents to clinic for follow-up evaluation of after his CT scan. He has a much longer stump than I would have thought. He has a parastomal hernia and a ventral hernia. He is here again with his mother and is continuing to do well with a psych stand point. He says he is hurting some in the abdomen. He has been having some sinus drainage and stuffiness for 2 weeks now and is running a low grade fever today.   Review of Systems:  No cough  Congestion/ sinus drainage All other review of systems: otherwise negative   Vital Signs:  BP 136/90   Pulse 91   Temp 99.2 F (37.3 C) (Oral)   Resp 16   Ht 5\' 11"  (1.803 m)   Wt 235 lb (106.6 kg)   SpO2 94%   BMI 32.78 kg/m    Physical Exam:  Physical Exam Cardiovascular:     Rate and Rhythm: Normal rate.  Pulmonary:     Effort: Pulmonary effort is normal.  Abdominal:     General: There is no distension.     Palpations: Abdomen is soft.     Tenderness: There is no abdominal tenderness.     Hernia: A hernia is present.     Comments: Ostomy bag in place  Neurological:     Mental Status: He is alert.     Imaging:  personally reviewed- fascia defect midline 8 cm, rectal stump 13cm  CLINICAL DATA:  Abdominal pain, hernia suspected ventral hernia right side and left sided colostomy in place   EXAM: CT ABDOMEN AND PELVIS WITH CONTRAST   TECHNIQUE: Multidetector CT imaging of the abdomen and pelvis was performed using the standard protocol following bolus administration of intravenous contrast.   CONTRAST:  44mL OMNIPAQUE IOHEXOL 350 MG/ML SOLN   COMPARISON:  11/26/2014   FINDINGS: Lower chest: Included lung bases are clear.  Heart size is normal.   Hepatobiliary: No focal liver abnormality is seen. No gallstones, gallbladder wall thickening, or biliary dilatation.   Pancreas: Unremarkable. No pancreatic ductal dilatation or surrounding inflammatory changes.   Spleen:  Normal in size without focal abnormality.   Adrenals/Urinary Tract: Unremarkable adrenal glands. Kidneys enhance symmetrically without focal lesion, stone, or hydronephrosis. Ureters are nondilated. Urinary bladder appears unremarkable.   Stomach/Bowel: Rectal tube is noted. There is enteric contrast throughout the stomach and bowel. Postsurgical changes from sigmoid resection. Unremarkable appearance of the rectal cuff. No extraluminal contrast. Left lower quadrant end colostomy with large bowel containing parastomal hernia. No evidence of bowel obstruction. No focal bowel wall thickening or inflammatory changes.   Vascular/Lymphatic: Scattered aortoiliac atherosclerotic calcifications without aneurysm. No abdominopelvic lymphadenopathy.   Reproductive: Prostate is unremarkable.   Other: No ascites. No abdominopelvic fluid collection. No pneumoperitoneum. Rectus diastasis with large ventral abdominal wall hernia containing large and small bowel. Additional left parastomal hernia, as above.   Musculoskeletal: Lumbar levocurvature with facet predominant multilevel lumbar spondylosis. No acute bony findings. No fluid collections within the soft tissues.   IMPRESSION: 1. No acute abdominopelvic findings. 2. Left lower quadrant end colostomy with large bowel containing parastomal hernia. No evidence of bowel obstruction. 3. Rectus diastasis with large ventral abdominal wall hernia containing large and small bowel.   Aortic Atherosclerosis (ICD10-I70.0).     Electronically Signed   By: 14/12/2013 D.O.   On: 09/17/2021 12:58     Assessment:  55 y.o. yo Male with end colostomy and  ventral hernia. He had originally been referred out for reversal due to fears of the stump being ultra low. Fortunately it is a lot longer than I would have guessed. He is doing much better psychologically. He still needs a colonoscopy. I discussed with him and is mother that this hernia is large  and that if he is going to ever get reversed that has to happen before a permanent hernia repair. Discussed that this may be something we can take on at [facility] given that the stump is much longer and that he may can cancel his follow up with Dr.Waters.   Plan:  Stop smoking. Will need to stopped for 8 weeks before any surgery.  Get more protein in your diet, try ensure or boost.   Will get your referred back for colonoscopy, message sent to Ermalinda Memos PA and Dr. Jena Gauss  I will talk to Dr. Lovell Sheehan and other colleagues and let you know before your appt with Dr. Byrd Hesselbach if we will just proceed here with your repair and reversal. Especially given insurance issues etc.  Potentially surgery around beginning of December.  Prescribed Z pack given the 2 weeks of sinusitis and the fever.  Discussed that he will likely have a hernia eventually after reversal and primary repair and that he would ultimately need a mesh repair in a staged procedure  Algis Greenhouse, MD [facility] [address] [place], [place] [address] [phone] (office)

## 2021-09-22 NOTE — Patient Instructions (Signed)
Stop smoking. Will need to stopped for 8 weeks before any surgery. Get more protein in your diet, try ensure or boost.   Will get your referred back for colonoscopy. I will talk to Dr. Lovell Sheehan and other colleagues and let you know before your appt with Dr. Byrd Hesselbach if we will just proceed here with your repair and reversal. Especially given insurance issues etc.

## 2021-09-23 ENCOUNTER — Ambulatory Visit (HOSPITAL_COMMUNITY): Payer: PPO | Attending: Orthopaedic Surgery

## 2021-09-24 ENCOUNTER — Encounter (HOSPITAL_COMMUNITY): Payer: Self-pay | Admitting: Psychiatry

## 2021-09-24 ENCOUNTER — Telehealth (INDEPENDENT_AMBULATORY_CARE_PROVIDER_SITE_OTHER): Payer: PPO | Admitting: Psychiatry

## 2021-09-24 DIAGNOSIS — F5102 Adjustment insomnia: Secondary | ICD-10-CM

## 2021-09-24 DIAGNOSIS — F419 Anxiety disorder, unspecified: Secondary | ICD-10-CM

## 2021-09-24 DIAGNOSIS — F3112 Bipolar disorder, current episode manic without psychotic features, moderate: Secondary | ICD-10-CM

## 2021-09-24 MED ORDER — RISPERIDONE 4 MG PO TABS: ORAL_TABLET | ORAL | 1 refills | Status: DC

## 2021-09-24 MED ORDER — HYDROXYZINE HCL 25 MG PO TABS: ORAL_TABLET | ORAL | 1 refills | Status: DC

## 2021-09-24 NOTE — Progress Notes (Signed)
[facility] Followup visit   Patient Identification: [name] [name] MRN:  [id number] Date of Evaluation:  09/24/2021 Referral Source: [facility] Discharge Chief Complaint:  establish care, bipolar  Visit Diagnosis:    ICD-10-CM   1. Bipolar disorder, curr episode manic w/o psychotic features, moderate (HCC)  F31.12     2. Anxiety  F41.9 hydrOXYzine (ATARAX/VISTARIL) 25 MG tablet    3. Adjustment insomnia  F51.02      Virtual Visit via Telephone Note  I connected with [name] [name] on 09/24/21 at 10:00 AM EDT by telephone and verified that I am speaking with the correct person using two identifiers.  Location: Patient: home Provider: office   I discussed the limitations, risks, security and privacy concerns of performing an evaluation and management service by telephone and the availability of in person appointments. I also discussed with the patient that there may be a patient responsible charge related to this service. The patient expressed understanding and agreed to proceed.      I discussed the assessment and treatment plan with the patient. The patient was provided an opportunity to ask questions and all were answered. The patient agreed with the plan and demonstrated an understanding of the instructions.   The patient was advised to call back or seek an in-person evaluation if the symptoms worsen or if the condition fails to improve as anticipated.  I provided 15 minutes of non-face-to-face time during this encounter.    History of Present Illness:  Patient initially referred from discharge during April admission Admitted with mania and psychosis, in past , non compliance and alcohol use His klonopine and ambien were stopped    On evaluation today doing fair mood wise , have called earlier of twitiching with tegretol and we stopped it,  Denies twitching now  Takes risperdal at night, also on gaba Expected to have inguinal hernia surgery . Also on colostomy bag and they may take that  out as well Primary care started him on cymbalta for pain and anxiety Still takes hydroxyzine   Wife is supportive and he denies recurrence of use of drugs or alchol Does better when he is sober and compliant   Aggravating factors; noncompliance per history, medical co morbidity Modifying factors; mother, wife Duration most of his adult life   Past Psychiatric History: bipolar disorder  Previous Psychotropic Medications: Yes   Substance Abuse History in the last 12 months:  No.  Consequences of Substance Abuse: not gives clear history of using drugs or minimizes use  Past Medical History:  Past Medical History:  Diagnosis Date   Anxiety    Bipolar 1 disorder (HCC)    Chronic fatigue    Chronic pain    Complete intestinal obstruction (HCC)    Depression    Phreesia 12/03/2020   Fecal peritonitis (HCC) 08/03/2019   Fibromyalgia    Hyperlipidemia    Phreesia 12/03/2020   Ileus, postoperative (HCC)    Perforated rectum (HCC) 08/03/2019    Past Surgical History:  Procedure Laterality Date   COLON RESECTION SIGMOID  08/03/2019   Procedure: COLON RESECTION SIGMOID;  Surgeon: Lucretia Roers, MD;  Location: [facility];  Service: General;;   COLOSTOMY N/A 08/03/2019   Procedure: COLOSTOMY;  Surgeon: Lucretia Roers, MD;  Location: [facility];  Service: General;  Laterality: N/A;   FLEXIBLE SIGMOIDOSCOPY N/A 08/03/2019   Procedure: FLEXIBLE SIGMOIDOSCOPY;  Surgeon: Corbin Ade, MD;  [facility];  Service: Endoscopy;  Laterality: N/A;   KNEE  ARTHROSCOPY Left 2006   miniscus tear   LAPAROTOMY N/A 08/03/2019   Procedure: EXPLORATORY LAPAROTOMY;  Surgeon: Lucretia Roers, MD;  Location: [facility];  Service: General;  Laterality: N/A;   LYMPH GLAND EXCISION Left 1983   SMALL INTESTINE SURGERY N/A    Phreesia 12/03/2020   TONSILLECTOMY      Family Psychiatric History: denies  Family History:  Family History  Problem Relation Age of Onset   Diabetes Mother    Heart  attack Father    Diabetes Maternal Grandmother    Colon cancer Neg Hx    Colon polyps Neg Hx     Social History:   Social History   Socioeconomic History   Marital status: Married    Spouse name: Not on file   Number of children: Not on file   Years of education: Not on file   Highest education level: Not on file  Occupational History   Not on file  Tobacco Use   Smoking status: Every Day    Packs/day: 1.00    Years: 10.00    Pack years: 10.00    Types: Cigarettes   Smokeless tobacco: Never  Vaping Use   Vaping Use: Some days  Substance and Sexual Activity   Alcohol use: Not Currently    Comment: occasional; denied 10/02/19   Drug use: Not Currently    Types: Cocaine    Comment: last used in December 2020.    Sexual activity: Not Currently  Other Topics Concern   Not on file  Social History Narrative   Pt lives alone and is on disability. Counts his mother, sister and aunt as his social supports.    Social Determinants of Health   Financial Resource Strain: Not on file  Food Insecurity: Not on file  Transportation Needs: Not on file  Physical Activity: Not on file  Stress: Not on file  Social Connections: Not on file     Allergies:   Allergies  Allergen Reactions   Codeine Shortness Of Breath and Swelling   Divalproex Sodium Diarrhea, Itching, Rash, Shortness Of Breath and Swelling   Erythromycin Hives, Itching, Rash and Swelling   Iodinated Diagnostic Agents Swelling    Patient states he received "red Dye" 15-20 years ago and become red/flushed. No other symptoms. Patient did do 13 hour pre meds for CT 09/17/2021   Latex Itching and Rash   Sulfa Antibiotics Diarrhea, Itching, Rash, Shortness Of Breath and Swelling   Wheat Bran Hives, Itching, Rash, Shortness Of Breath and Swelling   Azithromycin    Demerol [Meperidine]    Levaquin [Levofloxacin In D5w]    Levofloxacin    Meperidine Hcl    Morphine    Morphine And Related    Other     arythromycin    Penicillin G    Penicillins     REACTION: Rash and facial swelling at age 47   Niacin And Related Rash    Metabolic Disorder Labs: Lab Results  Component Value Date   HGBA1C 5.7 (H) 06/01/2021   MPG 108.28 04/07/2021   No results found for: PROLACTIN Lab Results  Component Value Date   CHOL 180 06/01/2021   TRIG 235 (H) 06/01/2021   HDL 48 06/01/2021   CHOLHDL 3.8 06/01/2021   VLDL 32 04/09/2021   LDLCALC 92 06/01/2021   LDLCALC 135 (H) 04/09/2021   Lab Results  Component Value Date   TSH 1.470 06/01/2021    Therapeutic Level Labs: Lab Results  Component Value  Date   LITHIUM <0.06 (L) 09/20/2019   Lab Results  Component Value Date   CBMZ 6.7 04/19/2021   No results found for: VALPROATE  Current Medications: Current Outpatient Medications  Medication Sig Dispense Refill   azithromycin (ZITHROMAX Z-PAK) 250 MG tablet Take 2 tablets (500 mg) on  Day 1,  followed by 1 tablet (250 mg) once daily on Days 2 through 5. 6 each 0   DULoxetine (CYMBALTA) 60 MG capsule Cymbalta 60 mg capsule,delayed release  Take 1 capsule every day by oral route for 62 days.     gabapentin (NEURONTIN) 400 MG capsule Take 1 capsule (400 mg total) by mouth 2 (two) times daily. 60 capsule 0   hydrOXYzine (ATARAX/VISTARIL) 25 MG tablet TAKE (1) TABLET BY MOUTH TWICE DAILY AS NEEDED FOR ANXIETY. 60 tablet 1   meloxicam (MOBIC) 7.5 MG tablet Take 2 tablets (15 mg total) by mouth daily. 30 tablet 0   risperidone (RISPERDAL) 4 MG tablet Take one at night 30 tablet 1   telmisartan (MICARDIS) 20 MG tablet Take 1 tablet (20 mg total) by mouth daily. 30 tablet 2   traZODone (DESYREL) 100 MG tablet TAKE (1) TABLET BY MOUTH ONCE AT BEDTIME. 30 tablet 1   No current facility-administered medications for this visit.      Psychiatric Specialty Exam: Review of Systems  Cardiovascular:  Negative for chest pain.  Psychiatric/Behavioral:  Negative for agitation and self-injury.    There were no vitals  taken for this visit.There is no height or weight on file to calculate BMI.  General Appearance:   Eye Contact:   Speech:   somewhat rambling  Volume:  Normal  Mood:  Euthymic  Affect  Thought Process:  Coherent  Orientation:  Full (Time, Place, and Person)  Thought Content:  Rumination  Suicidal Thoughts:  No  Homicidal Thoughts:  No  Memory:  Immediate;   Fair Recent;   Fair  Judgement:  Other:  limited  Insight:  Shallow  Psychomotor Activity:  Normal  Concentration:  Concentration: Fair and Attention Span: Fair  Recall:  [employer] of Knowledge:Fair  Language: Fair  Akathisia:  No  Handed:    AIMS (if indicated):no involuntary movements noteiceable  Assets:  Desire for Improvement  ADL's:  Intact  Cognition: WNL  Sleep:  Fair   Screenings: AIMS    Flowsheet Row [facility]  AIMS Total Score 0      AUDIT    Flowsheet Row Ad[facility] ([facility]  Alcohol Use Disorder Identification Test Final Score (AUDIT) 0      GAD-7    Flowsheet Row Counselor from [facility]/2021 [facility]  Total GAD-7 Score 4      PHQ2-9    Flowsheet Row Office Visit from 07/17/2021 in [facility] Most recent reading at 07/17/2021  8:10 AM Office Visit from 05/27/2021 in [facility] Most recent reading at 05/27/2021  2:51 PM Video Visit from 05/27/2021 in [facility] [place] Most recent reading at 05/27/2021  1:52 PM Office Visit from 05/07/2021 in [facility] Most recent reading at 05/07/2021  9:37 AM Counselor from 05/05/2021 in [facility] Most recent reading at 05/05/2021  4:18 PM  PHQ-2 Total Score 1 1 1  0 1  PHQ-9 Total Score -- -- 6 -- 6      Flowsheet Row  Video Visit from 09/24/2021 in [facility] AT [place] Office Visit from 07/09/2021 in [facility] AT [place] Video Visit from 05/27/2021 in [facility] AT [place]  C-SSRS RISK CATEGORY No Risk No Risk Error: Question 6 not populated       Assessment and Plan: as follows  Prior documentation reviewed  Bipolar current episode manic with psychosis: partial remission doing fair mood wise, has support, continue risperdal. Denies involuntary movements Now off tegretol as well Avoid klonopine  Adjustment insomnia  fair, takes trazadone prn now  Anxiety disorder unspecified ; manageable on hydroxyzine . Also on cyumbalta from primary care recently started  Fu 2 m or earlier if needed   Thresa Ross, MD 9/29/202210:16 AM

## 2021-09-28 ENCOUNTER — Other Ambulatory Visit (INDEPENDENT_AMBULATORY_CARE_PROVIDER_SITE_OTHER): Payer: Self-pay

## 2021-09-28 ENCOUNTER — Ambulatory Visit (INDEPENDENT_AMBULATORY_CARE_PROVIDER_SITE_OTHER): Payer: PPO | Admitting: Internal Medicine

## 2021-09-28 ENCOUNTER — Encounter (INDEPENDENT_AMBULATORY_CARE_PROVIDER_SITE_OTHER): Payer: Self-pay

## 2021-09-28 ENCOUNTER — Other Ambulatory Visit: Payer: Self-pay

## 2021-09-28 ENCOUNTER — Encounter: Payer: Self-pay | Admitting: Internal Medicine

## 2021-09-28 VITALS — BP 130/72 | HR 96 | Temp 98.5°F | Resp 18 | Ht 71.0 in | Wt 232.1 lb

## 2021-09-28 DIAGNOSIS — I10 Essential (primary) hypertension: Secondary | ICD-10-CM

## 2021-09-28 DIAGNOSIS — Z1211 Encounter for screening for malignant neoplasm of colon: Secondary | ICD-10-CM

## 2021-09-28 DIAGNOSIS — Z933 Colostomy status: Secondary | ICD-10-CM

## 2021-09-28 DIAGNOSIS — F312 Bipolar disorder, current episode manic severe with psychotic features: Secondary | ICD-10-CM

## 2021-09-28 DIAGNOSIS — G609 Hereditary and idiopathic neuropathy, unspecified: Secondary | ICD-10-CM

## 2021-09-28 MED ORDER — GABAPENTIN 300 MG PO CAPS: 300.0000 mg | ORAL_CAPSULE | Freq: Three times a day (TID) | ORAL | 3 refills | Status: DC

## 2021-09-28 NOTE — Patient Instructions (Signed)
Please start taking Gabapentin 300 mg 3 times in a day.  Continue taking Telmisartan as prescribed.  Please check with [facility] for Cymbalta.

## 2021-09-29 DIAGNOSIS — K9400 Colostomy complication, unspecified: Secondary | ICD-10-CM | POA: Diagnosis not present

## 2021-10-02 DIAGNOSIS — G609 Hereditary and idiopathic neuropathy, unspecified: Secondary | ICD-10-CM | POA: Insufficient documentation

## 2021-10-02 DIAGNOSIS — K9400 Colostomy complication, unspecified: Secondary | ICD-10-CM | POA: Diagnosis not present

## 2021-10-02 NOTE — Assessment & Plan Note (Signed)
Had perforated rectum due to foreign body, s/p partial colectomy Currently has colostomy in place, saw General surgeon - needs colonoscopy before proceeding for colostomy reversal

## 2021-10-02 NOTE — Assessment & Plan Note (Signed)
Recent inpatient Psychiatric treatment for acute mania Appears to be more stable now Now on Risperidone, Atarax, Trazodone and Cymbalta - explained to the patient about importance of compliance to the treatment. Follow up with [facility] therapist and Psychiatry

## 2021-10-02 NOTE — Progress Notes (Signed)
Established Patient Office Visit  Subjective:  Patient ID: [name] [name], male    DOB: [birth year]  Age: 55 y.o. MRN: [id number]  CC:  Chief Complaint  Patient presents with   Follow-up    4 month follow up HTN is set up for colonoscopy reversal     HPI [name] [name]  is a 55 year old male with PMH of bipolar disorder, chronic pain syndrome, polysubstance abuse, and perforated rectum s/p partial colectomy and colostomy who presents for follow up of his chronic medical conditions.   HTN: His BP is well-controlled. He takes Telmisartan 20 mg QD. Denies any chest pain, dyspnea or palpitations.   Bipolar disorder: He had Psychiatrist visit recently. He is to continue Risperidone, Atarax and Trazodone for now. He appears to be more attentive and in stable mood today compared to previous visits. He states that he has started living with his wife now.  Chronic pain syndrome: He has chronic neuropathic pain, for which he takes Gabapentin 400 mg BID. He states that his symptoms worsen in the afternoon and he prefers to take Gabapentin as TID dose.  Colostomy in place: He has seen GI for colonoscopy and is scheduled for it to plan for reversal for colostomy later. He is going to have incisional hernia repair as well.  Past Medical History:  Diagnosis Date   Anxiety    Bipolar 1 disorder ([place])    Chronic fatigue    Chronic pain    Complete intestinal obstruction ([place])    Depression    Phreesia 12/03/2020   Fecal peritonitis ([place]) 08/03/2019   Fibromyalgia    Hyperlipidemia    Phreesia 12/03/2020   Ileus, postoperative ([place])    Perforated rectum ([place]) 08/03/2019    Past Surgical History:  Procedure Laterality Date   COLON RESECTION SIGMOID  08/03/2019   Procedure: COLON RESECTION SIGMOID;  Surgeon: Virl Cagey, MD;  Location: [facility];  Service: General;;   COLOSTOMY N/A 08/03/2019   Procedure: COLOSTOMY;  Surgeon: Virl Cagey, MD;  Location: [facility];  Service: General;  Laterality:  N/A;   FLEXIBLE SIGMOIDOSCOPY N/A 08/03/2019   Procedure: FLEXIBLE SIGMOIDOSCOPY;  Surgeon: Daneil Dolin, MD;  [facility];  Service: Endoscopy;  Laterality: N/A;   KNEE ARTHROSCOPY Left 2006   miniscus tear   LAPAROTOMY N/A 08/03/2019   Procedure: EXPLORATORY LAPAROTOMY;  Surgeon: Virl Cagey, MD;  Location: [facility];  Service: General;  Laterality: N/A;   LYMPH GLAND EXCISION Left 1983   SMALL INTESTINE SURGERY N/A    Phreesia 12/03/2020   TONSILLECTOMY      Family History  Problem Relation Age of Onset   Diabetes Mother    Heart attack Father    Diabetes Maternal Grandmother    Colon cancer Neg Hx    Colon polyps Neg Hx     Social History   Socioeconomic History   Marital status: Married    Spouse name: Not on file   Number of children: Not on file   Years of education: Not on file   Highest education level: Not on file  Occupational History   Not on file  Tobacco Use   Smoking status: Every Day    Packs/day: 1.00    Years: 10.00    Pack years: 10.00    Types: Cigarettes   Smokeless tobacco: Never  Vaping Use   Vaping Use: Some days  Substance and Sexual Activity   Alcohol use: Not Currently  Comment: occasional; denied 10/02/19   Drug use: Not Currently    Types: Cocaine    Comment: last used in December 2020.    Sexual activity: Not Currently  Other Topics Concern   Not on file  Social History Narrative   Pt lives alone and is on disability. Counts his mother, sister and aunt as his social supports.    Social Determinants of Health   Financial Resource Strain: Not on file  Food Insecurity: Not on file  Transportation Needs: Not on file  Physical Activity: Not on file  Stress: Not on file  Social Connections: Not on file  Intimate Partner Violence: Not on file    Outpatient Medications Prior to Visit  Medication Sig Dispense Refill   cyclobenzaprine (FLEXERIL) 10 MG tablet Take 10 mg by mouth 2 (two) times daily.     DULoxetine  (CYMBALTA) 60 MG capsule Cymbalta 60 mg capsule,delayed release  Take 1 capsule every day by oral route for 62 days.     hydrOXYzine (ATARAX/VISTARIL) 25 MG tablet TAKE (1) TABLET BY MOUTH TWICE DAILY AS NEEDED FOR ANXIETY. 60 tablet 1   meloxicam (MOBIC) 7.5 MG tablet Take 2 tablets (15 mg total) by mouth daily. 30 tablet 0   risperidone (RISPERDAL) 4 MG tablet Take one at night 30 tablet 1   telmisartan (MICARDIS) 20 MG tablet Take 1 tablet (20 mg total) by mouth daily. 30 tablet 2   traZODone (DESYREL) 100 MG tablet TAKE (1) TABLET BY MOUTH ONCE AT BEDTIME. 30 tablet 1   gabapentin (NEURONTIN) 400 MG capsule Take 1 capsule (400 mg total) by mouth 2 (two) times daily. 60 capsule 0   No facility-administered medications prior to visit.    Allergies  Allergen Reactions   Codeine Shortness Of Breath and Swelling   Divalproex Sodium Diarrhea, Itching, Rash, Shortness Of Breath and Swelling   Erythromycin Hives, Itching, Rash and Swelling   Iodinated Diagnostic Agents Swelling    Patient states he received "red Dye" 15-20 years ago and become red/flushed. No other symptoms. Patient did do 13 hour pre meds for CT 09/17/2021   Latex Itching and Rash   Sulfa Antibiotics Diarrhea, Itching, Rash, Shortness Of Breath and Swelling   Wheat Bran Hives, Itching, Rash, Shortness Of Breath and Swelling   Azithromycin    Demerol [Meperidine]    Levaquin [Levofloxacin In D5w]    Levofloxacin    Meperidine Hcl    Morphine    Morphine And Related    Other     arythromycin   Penicillin G    Penicillins     REACTION: Rash and facial swelling at age 35   Niacin And Related Rash    ROS Review of Systems  Constitutional:  Negative for chills and fever.  HENT:  Negative for congestion and sore throat.   Eyes:  Negative for pain and discharge.  Respiratory:  Negative for cough and shortness of breath.   Cardiovascular:  Negative for chest pain and palpitations.  Gastrointestinal:  Negative for  constipation, diarrhea, nausea and vomiting.  Endocrine: Negative for polydipsia and polyuria.  Genitourinary:  Negative for dysuria and hematuria.  Musculoskeletal:  Positive for back pain and myalgias. Negative for neck pain and neck stiffness.  Skin:  Negative for rash.  Neurological:  Negative for dizziness, weakness, numbness and headaches.  Psychiatric/Behavioral:  Positive for decreased concentration and sleep disturbance. Negative for agitation and behavioral problems. The patient is nervous/anxious.      Objective:  Physical Exam Vitals reviewed.  Constitutional:      General: He is not in acute distress.    Appearance: He is not diaphoretic.  HENT:     Head: Normocephalic.     Nose: Nose normal.     Mouth/Throat:     Mouth: Mucous membranes are moist.  Eyes:     General: No scleral icterus.    Extraocular Movements: Extraocular movements intact.     Pupils: Pupils are equal, round, and reactive to light.  Cardiovascular:     Rate and Rhythm: Normal rate and regular rhythm.     Pulses: Normal pulses.     Heart sounds: Normal heart sounds. No murmur heard. Pulmonary:     Breath sounds: Normal breath sounds. No wheezing or rales.  Abdominal:     Palpations: Abdomen is soft.     Tenderness: There is no abdominal tenderness.     Comments: Ventral hernia Colostomy bag in place - on left side  Musculoskeletal:     Cervical back: Neck supple. No tenderness.     Right lower leg: No edema.     Left lower leg: No edema.  Skin:    General: Skin is warm.     Findings: No rash.  Neurological:     General: No focal deficit present.     Mental Status: He is alert and oriented to person, place, and time.     Sensory: No sensory deficit.     Motor: No weakness.  Psychiatric:        Mood and Affect: Mood is anxious.        Behavior: Behavior is hyperactive.        Thought Content: Thought content does not include homicidal or suicidal ideation.    BP 130/72 (BP  Location: Left Arm, Patient Position: Sitting, Cuff Size: Normal)   Pulse 96   Temp 98.5 F (36.9 C) (Oral)   Resp 18   Ht '5\' 11"'  (1.803 m)   Wt 232 lb 1.9 oz (105.3 kg)   SpO2 95%   BMI 32.37 kg/m  Wt Readings from Last 3 Encounters:  09/28/21 232 lb 1.9 oz (105.3 kg)  09/22/21 235 lb (106.6 kg)  09/15/21 232 lb (105.2 kg)     Health Maintenance Due  Topic Date Due   COVID-19 Vaccine (1) Never done   COLONOSCOPY (Pts 45-44yr Insurance coverage will need to be confirmed)  Never done   Zoster Vaccines- Shingrix (1 of 2) Never done   INFLUENZA VACCINE  07/27/2021    There are no preventive care reminders to display for this patient.  Lab Results  Component Value Date   TSH 1.470 06/01/2021   Lab Results  Component Value Date   WBC 8.5 06/01/2021   HGB 15.2 06/01/2021   HCT 44.1 06/01/2021   MCV 86 06/01/2021   PLT 210 06/01/2021   Lab Results  Component Value Date   NA 137 06/01/2021   K 4.7 06/01/2021   CO2 25 06/01/2021   GLUCOSE 110 (H) 06/01/2021   BUN 8 06/01/2021   CREATININE 0.90 09/17/2021   BILITOT <0.2 06/01/2021   ALKPHOS 93 06/01/2021   AST 13 06/01/2021   ALT 16 06/01/2021   PROT 6.4 06/01/2021   ALBUMIN 4.2 06/01/2021   CALCIUM 9.0 06/01/2021   ANIONGAP 13 04/06/2021   EGFR 96 06/01/2021   Lab Results  Component Value Date   CHOL 180 06/01/2021   Lab Results  Component Value Date   HDL  48 06/01/2021   Lab Results  Component Value Date   LDLCALC 92 06/01/2021   Lab Results  Component Value Date   TRIG 235 (H) 06/01/2021   Lab Results  Component Value Date   CHOLHDL 3.8 06/01/2021   Lab Results  Component Value Date   HGBA1C 5.7 (H) 06/01/2021      Assessment & Plan:   Problem List Items Addressed This Visit       Cardiovascular and Mediastinum   HTN (hypertension) - Primary    BP Readings from Last 1 Encounters:  09/28/21 130/72  Well-controlled with Telmisartan Counseled for compliance with the  medications Advised DASH diet and moderate exercise/walking, at least 150 mins/week         Nervous and Auditory   Idiopathic peripheral neuropathy    Uncontrolled with Gabapentin 400 mg BID Increased frequency of Gabapentin to 300 mg TID now      Relevant Medications   cyclobenzaprine (FLEXERIL) 10 MG tablet   gabapentin (NEURONTIN) 300 MG capsule     Other   Colostomy in place [facility])    Had perforated rectum due to foreign body, s/p partial colectomy Currently has colostomy in place, saw General surgeon - needs colonoscopy before proceeding for colostomy reversal      Bipolar affective disorder ([place])    Recent inpatient Psychiatric treatment for acute mania Appears to be more stable now Now on Risperidone, Atarax, Trazodone and Cymbalta - explained to the patient about importance of compliance to the treatment. Follow up with [facility] therapist and Psychiatry       Meds ordered this encounter  Medications   gabapentin (NEURONTIN) 300 MG capsule    Sig: Take 1 capsule (300 mg total) by mouth 3 (three) times daily.    Dispense:  90 capsule    Refill:  3    Dose change    Follow-up: Return in about 4 months (around 01/29/2022) for Annual physical.    Lindell Spar, MD

## 2021-10-02 NOTE — Assessment & Plan Note (Signed)
Uncontrolled with Gabapentin 400 mg BID Increased frequency of Gabapentin to 300 mg TID now

## 2021-10-02 NOTE — Assessment & Plan Note (Signed)
BP Readings from Last 1 Encounters:  09/28/21 130/72   Well-controlled with Telmisartan Counseled for compliance with the medications Advised DASH diet and moderate exercise/walking, at least 150 mins/week

## 2021-10-05 ENCOUNTER — Ambulatory Visit (HOSPITAL_COMMUNITY)
Admission: RE | Admit: 2021-10-05 | Discharge: 2021-10-05 | Disposition: A | Discharge status: Home / Self Care | Payer: PPO | Source: Ambulatory Visit | Attending: Orthopaedic Surgery | Admitting: Orthopaedic Surgery

## 2021-10-05 ENCOUNTER — Other Ambulatory Visit: Payer: Self-pay

## 2021-10-05 DIAGNOSIS — G8929 Other chronic pain: Secondary | ICD-10-CM | POA: Insufficient documentation

## 2021-10-05 DIAGNOSIS — M25561 Pain in right knee: Secondary | ICD-10-CM | POA: Diagnosis not present

## 2021-10-07 ENCOUNTER — Encounter: Payer: Self-pay | Admitting: Orthopaedic Surgery

## 2021-10-07 ENCOUNTER — Ambulatory Visit (INDEPENDENT_AMBULATORY_CARE_PROVIDER_SITE_OTHER): Payer: PPO | Admitting: Orthopaedic Surgery

## 2021-10-07 VITALS — BP 137/89 | HR 70 | Ht 71.0 in | Wt 235.4 lb

## 2021-10-07 DIAGNOSIS — G8929 Other chronic pain: Secondary | ICD-10-CM

## 2021-10-07 DIAGNOSIS — M1711 Unilateral primary osteoarthritis, right knee: Secondary | ICD-10-CM | POA: Diagnosis not present

## 2021-10-07 DIAGNOSIS — M25561 Pain in right knee: Secondary | ICD-10-CM

## 2021-10-07 DIAGNOSIS — M23221 Derangement of posterior horn of medial meniscus due to old tear or injury, right knee: Secondary | ICD-10-CM

## 2021-10-07 NOTE — Progress Notes (Signed)
My knee is hurting more.  He has right knee pain, swelling and giving way.  He had MRI which showed: IMPRESSION: 1. Severe osteoarthritis particularly markedly severe in the medial compartment and more moderate in the patellofemoral joint and lateral compartment. 2. Degenerative tearing of the posterior horn medial meniscus with grade 3 oblique and somewhat amorphous signal extending to the superior surface. There is medial meniscal extrusion due to the severe articular space narrowing and prominent marginal osteophytes. 3. Moderate knee effusion with mild synovitis and a thickened medial plica. 4. Clustered free osteochondral fragments posterior to the posterior horn medial meniscus and PCL, probably from fragmented osteophytes. 5. Small ganglion cyst or parameniscal cyst along the superficial posteromedial joint capsule margin. 6. Geode or erosion along the proximal fibular head. Geodes noted along the posterior tibial spine. 7. Mildly thickened proximal MCL possibly from a remote injury. No surrounding edema currently.  I have explained the findings to him.  I will have him see Dr. Cleophas Dunker next week at patient's request.  Possible arthroscopy can be discussed then.  Right knee has pain medially, limp to right, effusion, crepitus and ROM 0 to 105, positive medial [place].  Encounter Diagnosis  Name Primary?   Chronic pain of right knee Yes   To see Dr. Cleophas Dunker next week.  Call if any problem.  Precautions discussed.  Electronically Signed Darreld Mclean, MD 10/12/20229:47 AM

## 2021-10-07 NOTE — Patient Instructions (Signed)

## 2021-10-14 ENCOUNTER — Ambulatory Visit (INDEPENDENT_AMBULATORY_CARE_PROVIDER_SITE_OTHER): Payer: PPO | Admitting: Orthopaedic Surgery

## 2021-10-14 ENCOUNTER — Encounter: Payer: Self-pay | Admitting: Orthopaedic Surgery

## 2021-10-14 ENCOUNTER — Other Ambulatory Visit: Payer: Self-pay

## 2021-10-14 DIAGNOSIS — M1712 Unilateral primary osteoarthritis, left knee: Secondary | ICD-10-CM | POA: Diagnosis not present

## 2021-10-14 NOTE — Progress Notes (Addendum)
Office Visit Note   Patient: [name] [name]           Date of Birth: [birth year]           MRN: [id number] Visit Date: 10/14/2021              Requested by: Anabel Halon, MD [address] [place],  [place] [address] PCP: Anabel Halon, MD   Assessment & Plan: Visit Diagnoses: No diagnosis found.  Plan: 55 year old gentleman was in follow-up for MRI today.  Right knee.  He has severe degenerative changes of all 3 compartments.  He has 5 degrees of varus and bone-on-bone in the medial compartment.  MRI also did demonstrate degenerative tear of the medial meniscus.  Given the severity of the arthritis it was discussed with him that a simple arthroscopy would not solve his arthritic picture and that he would be best served by a knee replacement.  Steroid injections and a knee support were also discussed.  We will furnish him with a knee support today.  He is also anticipating further abdominal surgery he is currently wearing a colostomy bag.  May follow-up as needed  Follow-Up Instructions: No follow-ups on file.   Orders:  No orders of the defined types were placed in this encounter.  No orders of the defined types were placed in this encounter.     Procedures: No procedures performed   Clinical Data: No additional findings.   Subjective: No chief complaint on file. Patient presents today for MRI review of his right knee.  HPI Patient is a pleasant 55 year old gentleman who comes in today for follow-up of the MRI of his right knee.  Concerns were for a medial meniscus tear  Review of Systems  All other systems reviewed and are negative.   Objective: Vital Signs: There were no vitals taken for this visit.  Physical Exam Constitutional:      Appearance: Normal appearance.  Pulmonary:     Effort: Pulmonary effort is normal.  Skin:    General: Skin is warm and dry.  Neurological:     General: No focal deficit present.     Mental Status: He is alert.  Psychiatric:         Mood and Affect: Mood normal.        Behavior: Behavior normal.    Ortho Exam Examination of his right knee he has clinical varus alignment.  No effusion no warmth today.  He has full extension.  MRI demonstrates degenerative tearing of the medial meniscus tear severe degenerative changes especially in the medial compartment.  Most recent x-rays demonstrate varus alignment of 5 degrees with loss of joint space especially in the medial compartment with associated osteophytes Specialty Comments:  No specialty comments available.  Imaging: No results found.   PMFS History: Patient Active Problem List   Diagnosis Date Noted   Idiopathic peripheral neuropathy 10/02/2021   Medial knee pain, right 07/17/2021   Anxiety 05/07/2021   At increased risk for financial abuse 05/07/2021   Bipolar affective disorder (HCC) 04/07/2021   Head injury 12/04/2020   Chronic pain syndrome 12/04/2020   History of substance use 12/04/2020   Tobacco abuse 12/04/2020   HTN (hypertension) 12/04/2020   Loss of weight 01/16/2020   Rectal perforation (HCC) 12/23/2019   Incisional hernia, without obstruction or gangrene 11/15/2019   Colostomy in place [facility]) 09/27/2019   HYPERLIPIDEMIA 04/26/2007   ALLERGIC RHINITIS 04/26/2007   Past Medical History:  Diagnosis Date  Anxiety    Bipolar 1 disorder (HCC)    Chronic fatigue    Chronic pain    Complete intestinal obstruction (HCC)    Depression    Phreesia 12/03/2020   Fecal peritonitis (HCC) 08/03/2019   Fibromyalgia    Hyperlipidemia    Phreesia 12/03/2020   Ileus, postoperative (HCC)    Perforated rectum (HCC) 08/03/2019    Family History  Problem Relation Age of Onset   Diabetes Mother    Heart attack Father    Diabetes Maternal Grandmother    Colon cancer Neg Hx    Colon polyps Neg Hx     Past Surgical History:  Procedure Laterality Date   COLON RESECTION SIGMOID  08/03/2019   Procedure: COLON RESECTION SIGMOID;  Surgeon: Lucretia Roers, MD;  Location: [facility];  Service: General;;   COLOSTOMY N/A 08/03/2019   Procedure: COLOSTOMY;  Surgeon: Lucretia Roers, MD;  Location: [facility];  Service: General;  Laterality: N/A;   FLEXIBLE SIGMOIDOSCOPY N/A 08/03/2019   Procedure: FLEXIBLE SIGMOIDOSCOPY;  Surgeon: Corbin Ade, MD;  Location: [facility];  Service: Endoscopy;  Laterality: N/A;   KNEE ARTHROSCOPY Left 2006   miniscus tear   LAPAROTOMY N/A 08/03/2019   Procedure: EXPLORATORY LAPAROTOMY;  Surgeon: Lucretia Roers, MD;  Location: [facility];  Service: General;  Laterality: N/A;   LYMPH GLAND EXCISION Left 1983   SMALL INTESTINE SURGERY N/A    Phreesia 12/03/2020   TONSILLECTOMY     Social History   Occupational History   Not on file  Tobacco Use   Smoking status: Every Day    Packs/day: 1.00    Years: 10.00    Pack years: 10.00    Types: Cigarettes   Smokeless tobacco: Never  Vaping Use   Vaping Use: Some days  Substance and Sexual Activity   Alcohol use: Not Currently    Comment: occasional; denied 10/02/19   Drug use: Not Currently    Types: Cocaine    Comment: last used in December 2020.    Sexual activity: Not Currently

## 2021-10-16 NOTE — Patient Instructions (Signed)
   Your procedure is scheduled on: 10/21/2021  Report to [facility] at    8:30  AM.  Call this number if you have problems the morning of surgery: [phone]   Remember:              Follow Directions on the letter you received from Your Physician's office regarding the Bowel Prep              No Smoking the day of Procedure :   Take these medicines the morning of surgery with A SIP OF WATER: Cymbalta, gabapentin, and hydroxyzine   Do not wear jewelry, make-up or nail polish.    Do not bring valuables to the hospital.  Contacts, dentures or bridgework may not be worn into surgery.  .   Patients discharged the day of surgery will not be allowed to drive home.     Colonoscopy, Adult, Care After This sheet gives you information about how to care for yourself after your procedure. Your health care provider may also give you more specific instructions. If you have problems or questions, contact your health care provider. What can I expect after the procedure? After the procedure, it is common to have: A small amount of blood in your stool for 24 hours after the procedure. Some gas. Mild abdominal cramping or bloating.  Follow these instructions at home: General instructions  For the first 24 hours after the procedure: Do not drive or use machinery. Do not sign important documents. Do not drink alcohol. Do your regular daily activities at a slower pace than normal. Eat soft, easy-to-digest foods. Rest often. Take over-the-counter or prescription medicines only as told by your health care provider. It is up to you to get the results of your procedure. Ask your health care provider, or the department performing the procedure, when your results will be ready. Relieving cramping and bloating Try walking around when you have cramps or feel bloated. Apply heat to your abdomen as told by your health care provider. Use a heat source that your health care provider recommends, such as a  moist heat pack or a heating pad. Place a towel between your skin and the heat source. Leave the heat on for 20-30 minutes. Remove the heat if your skin turns bright red. This is especially important if you are unable to feel pain, heat, or cold. You may have a greater risk of getting burned. Eating and drinking Drink enough fluid to keep your urine clear or pale yellow. Resume your normal diet as instructed by your health care provider. Avoid heavy or fried foods that are hard to digest. Avoid drinking alcohol for as long as instructed by your health care provider. Contact a health care provider if: You have blood in your stool 2-3 days after the procedure. Get help right away if: You have more than a small spotting of blood in your stool. You pass large blood clots in your stool. Your abdomen is swollen. You have nausea or vomiting. You have a fever. You have increasing abdominal pain that is not relieved with medicine. This information is not intended to replace advice given to you by your health care provider. Make sure you discuss any questions you have with your health care provider. Document Released: 07/27/2004 Document Revised: 09/06/2016 Document Reviewed: 02/24/2016 Elsevier Interactive Patient Education  [employer].

## 2021-10-19 ENCOUNTER — Encounter (HOSPITAL_COMMUNITY)
Admission: RE | Admit: 2021-10-19 | Discharge: 2021-10-19 | Disposition: A | Discharge status: Home / Self Care | Payer: PPO | Source: Ambulatory Visit | Attending: Internal Medicine | Admitting: Internal Medicine

## 2021-10-19 ENCOUNTER — Other Ambulatory Visit: Payer: Self-pay

## 2021-10-19 ENCOUNTER — Encounter (HOSPITAL_COMMUNITY): Payer: Self-pay

## 2021-10-19 VITALS — BP 142/95 | HR 100 | Temp 98.6°F | Resp 18 | Ht 71.0 in | Wt 230.0 lb

## 2021-10-19 DIAGNOSIS — Z01818 Encounter for other preprocedural examination: Secondary | ICD-10-CM

## 2021-10-19 DIAGNOSIS — Z01812 Encounter for preprocedural laboratory examination: Secondary | ICD-10-CM | POA: Insufficient documentation

## 2021-10-19 DIAGNOSIS — F1510 Other stimulant abuse, uncomplicated: Secondary | ICD-10-CM | POA: Diagnosis not present

## 2021-10-19 DIAGNOSIS — F1411 Cocaine abuse, in remission: Secondary | ICD-10-CM | POA: Insufficient documentation

## 2021-10-19 HISTORY — DX: Essential (primary) hypertension: I10

## 2021-10-19 HISTORY — DX: Sleep apnea, unspecified: G47.30

## 2021-10-19 LAB — RAPID URINE DRUG SCREEN, HOSP PERFORMED
Amphetamines: NOT DETECTED
Barbiturates: NOT DETECTED
Benzodiazepines: NOT DETECTED
Cocaine: NOT DETECTED
Opiates: NOT DETECTED
Tetrahydrocannabinol: NOT DETECTED

## 2021-10-21 ENCOUNTER — Ambulatory Visit (HOSPITAL_COMMUNITY): Payer: PPO | Admitting: Anesthesiology

## 2021-10-21 ENCOUNTER — Encounter (INDEPENDENT_AMBULATORY_CARE_PROVIDER_SITE_OTHER): Payer: Self-pay | Admitting: *Deleted

## 2021-10-21 ENCOUNTER — Ambulatory Visit (HOSPITAL_COMMUNITY)
Admission: RE | Admit: 2021-10-21 | Discharge: 2021-10-21 | Disposition: A | Discharge status: Home / Self Care | Payer: PPO | Attending: Internal Medicine | Admitting: Internal Medicine

## 2021-10-21 ENCOUNTER — Encounter (HOSPITAL_COMMUNITY)
Admission: RE | Disposition: A | Discharge status: Home / Self Care | Payer: Self-pay | Source: Home / Self Care | Attending: Internal Medicine

## 2021-10-21 DIAGNOSIS — Z1211 Encounter for screening for malignant neoplasm of colon: Secondary | ICD-10-CM | POA: Insufficient documentation

## 2021-10-21 DIAGNOSIS — F319 Bipolar disorder, unspecified: Secondary | ICD-10-CM | POA: Diagnosis not present

## 2021-10-21 DIAGNOSIS — E785 Hyperlipidemia, unspecified: Secondary | ICD-10-CM | POA: Insufficient documentation

## 2021-10-21 DIAGNOSIS — Z885 Allergy status to narcotic agent status: Secondary | ICD-10-CM | POA: Diagnosis not present

## 2021-10-21 DIAGNOSIS — Z881 Allergy status to other antibiotic agents status: Secondary | ICD-10-CM | POA: Diagnosis not present

## 2021-10-21 DIAGNOSIS — I10 Essential (primary) hypertension: Secondary | ICD-10-CM | POA: Diagnosis not present

## 2021-10-21 DIAGNOSIS — M797 Fibromyalgia: Secondary | ICD-10-CM | POA: Insufficient documentation

## 2021-10-21 DIAGNOSIS — Z933 Colostomy status: Secondary | ICD-10-CM | POA: Diagnosis not present

## 2021-10-21 DIAGNOSIS — Z79899 Other long term (current) drug therapy: Secondary | ICD-10-CM | POA: Insufficient documentation

## 2021-10-21 DIAGNOSIS — K439 Ventral hernia without obstruction or gangrene: Secondary | ICD-10-CM | POA: Insufficient documentation

## 2021-10-21 DIAGNOSIS — K644 Residual hemorrhoidal skin tags: Secondary | ICD-10-CM | POA: Insufficient documentation

## 2021-10-21 DIAGNOSIS — G4730 Sleep apnea, unspecified: Secondary | ICD-10-CM | POA: Insufficient documentation

## 2021-10-21 DIAGNOSIS — F17210 Nicotine dependence, cigarettes, uncomplicated: Secondary | ICD-10-CM | POA: Diagnosis not present

## 2021-10-21 DIAGNOSIS — Z882 Allergy status to sulfonamides status: Secondary | ICD-10-CM | POA: Diagnosis not present

## 2021-10-21 DIAGNOSIS — Q438 Other specified congenital malformations of intestine: Secondary | ICD-10-CM | POA: Insufficient documentation

## 2021-10-21 DIAGNOSIS — K6289 Other specified diseases of anus and rectum: Secondary | ICD-10-CM | POA: Diagnosis not present

## 2021-10-21 DIAGNOSIS — Z880 Allergy status to penicillin: Secondary | ICD-10-CM | POA: Diagnosis not present

## 2021-10-21 DIAGNOSIS — Z791 Long term (current) use of non-steroidal anti-inflammatories (NSAID): Secondary | ICD-10-CM | POA: Insufficient documentation

## 2021-10-21 HISTORY — PX: COLONOSCOPY WITH PROPOFOL: SHX5780

## 2021-10-21 LAB — HM COLONOSCOPY

## 2021-10-21 SURGERY — COLONOSCOPY WITH PROPOFOL
Anesthesia: General

## 2021-10-21 MED ORDER — MIDAZOLAM HCL 2 MG/2ML IJ SOLN
INTRAMUSCULAR | Status: DC | PRN
  Administered 2021-10-21: 2 mg via INTRAVENOUS

## 2021-10-21 MED ORDER — MIDAZOLAM HCL 2 MG/2ML IJ SOLN
INTRAMUSCULAR | Status: AC
  Filled 2021-10-21: qty 2

## 2021-10-21 MED ORDER — PROPOFOL 1000 MG/100ML IV EMUL
INTRAVENOUS | Status: AC
  Filled 2021-10-21: qty 100

## 2021-10-21 MED ORDER — ONDANSETRON HCL 4 MG/2ML IJ SOLN
INTRAMUSCULAR | Status: DC | PRN
  Administered 2021-10-21: 4 mg via INTRAVENOUS

## 2021-10-21 MED ORDER — LACTATED RINGERS IV SOLN: INTRAVENOUS | Status: DC

## 2021-10-21 MED ORDER — PROPOFOL 10 MG/ML IV BOLUS
INTRAVENOUS | Status: DC | PRN
  Administered 2021-10-21: 150 mg via INTRAVENOUS

## 2021-10-21 MED ORDER — PROPOFOL 500 MG/50ML IV EMUL
INTRAVENOUS | Status: DC | PRN
  Administered 2021-10-21: 150 ug/kg/min via INTRAVENOUS

## 2021-10-21 NOTE — Discharge Instructions (Signed)
Resume usual medications and diet as before. No driving for 24 hours. Next screening exam in 10 years. Follow-up with Dr. Larae Grooms.

## 2021-10-21 NOTE — Transfer of Care (Signed)
Immediate Anesthesia Transfer of Care Note  Patient: [name] [name]  Procedure(s) Performed: COLONOSCOPY WITH PROPOFOL  Patient Location: Short Stay  Anesthesia Type:General  Level of Consciousness: awake, alert  and oriented  Airway & Oxygen Therapy: Patient Spontanous Breathing  Post-op Assessment: Report given to RN and Post -op Vital signs reviewed and stable  Post vital signs: Reviewed and stable  Last Vitals:  Vitals Value Taken Time  BP    Temp    Pulse    Resp    SpO2      Last Pain:  Vitals:   10/21/21 0846  TempSrc: Oral         Complications: No notable events documented.

## 2021-10-21 NOTE — Anesthesia Postprocedure Evaluation (Signed)
Anesthesia Post Note  Patient: [name] [name]  Procedure(s) Performed: COLONOSCOPY WITH PROPOFOL  Patient location during evaluation: Phase II Anesthesia Type: General Level of consciousness: awake and alert and oriented Pain management: pain level controlled Vital Signs Assessment: post-procedure vital signs reviewed and stable Respiratory status: spontaneous breathing, nonlabored ventilation and respiratory function stable Cardiovascular status: blood pressure returned to baseline and stable Postop Assessment: no apparent nausea or vomiting Anesthetic complications: no   No notable events documented.   Last Vitals:  Vitals:   10/21/21 0846 10/21/21 1039  BP: (!) 148/70 120/74  Pulse: 89 87  Resp: 18 16  Temp: 36.7 C 36.7 C  SpO2: 97% 96%    Last Pain:  Vitals:   10/21/21 1039  TempSrc: Oral                 Maecie Sevcik C Keniah Klemmer

## 2021-10-21 NOTE — Op Note (Signed)
[facility] Patient Name: [name] [name] Procedure Date: 10/21/2021 9:50 AM MRN: [id number] Date of Birth: 1966-05-26 Attending MD: Lionel December , MD CSN: [id number] Age: 55 Admit Type: Outpatient Procedure:                Colonoscopy via colostomy and examination of rectal                            stump Indications:              Screening for colorectal malignant neoplasm Providers:                Lionel December, MD, Nena Polio, RN, Cyril Mourning, Technician Referring MD:             Larae Grooms, MD Medicines:                Propofol per Anesthesia Complications:            No immediate complications. Estimated Blood Loss:     Estimated blood loss: none. Procedure:                Pre-Anesthesia Assessment:                           - Prior to the procedure, a History and Physical                            was performed, and patient medications and                            allergies were reviewed. The patient's tolerance of                            previous anesthesia was also reviewed. The risks                            and benefits of the procedure and the sedation                            options and risks were discussed with the patient.                            All questions were answered, and informed consent                            was obtained. Prior Anticoagulants: The patient has                            taken no previous anticoagulant or antiplatelet                            agents except for aspirin. ASA Grade Assessment:  III - A patient with severe systemic disease. After                            reviewing the risks and benefits, the patient was                            deemed in satisfactory condition to undergo the                            procedure.                           After obtaining informed consent, the colonoscope                            was passed under direct  vision. Throughout the                            procedure, the patient's blood pressure, pulse, and                            oxygen saturations were monitored continuously. The                            [id number]-HQ190L ([id number]) scope was introduced through                            the anus into rectum. Rectal mucosa was normal.                            There was some mucus. Blunt and was unremarkable.                            Scope was then advanced via colostomy into cecum,                            identified by appendiceal orifice and ileocecal                            valve. The colonoscopy was somewhat difficult due                            to a redundant colon. The patient tolerated the                            procedure well. The quality of the bowel                            preparation was adequate. The ileocecal valve,                            appendiceal orifice, and rectum were photographed. Scope In: 10:11:08 AM Scope Out: 10:30:18 AM Scope Withdrawal Time: 0 hours 3 minutes 24 seconds  Total Procedure  Duration: 0 hours 19 minutes 10 seconds  Findings:      The digital rectal exam findings include decreased sphincter tone.      Retroflexion in the rectum was not performed.      External hemorrhoids were found during retroflexion. The hemorrhoids       were medium-sized.      The sigmoid colon, descending colon, splenic flexure, transverse colon,       hepatic flexure, ascending colon and cecum appeared normal. Impression:               - Decreased sphincter tone found on digital rectal                            exam.                           - External hemorrhoids.                           - The sigmoid colon, descending colon, splenic                            flexure, transverse colon, hepatic flexure,                            ascending colon and cecum are normal.                           - No specimens collected. Moderate Sedation:      Per  Anesthesia Care Recommendation:           - Patient has a contact number available for                            emergencies. The signs and symptoms of potential                            delayed complications were discussed with the                            patient. Return to normal activities tomorrow.                            Written discharge instructions were provided to the                            patient.                           - Resume previous diet today.                           - Continue present medications.                           - Repeat colonoscopy in 10 years for screening  purposes.                           - follow with Dr. Henreitta Leber as planned. Procedure Code(s):        --- Professional ---                           [phone], Colonoscopy, flexible; diagnostic, including                            collection of specimen(s) by brushing or washing,                            when performed (separate procedure) Diagnosis Code(s):        --- Professional ---                           Z12.11, Encounter for screening for malignant                            neoplasm of colon                           K62.89, Other specified diseases of anus and rectum                           K64.4, Residual hemorrhoidal skin tags CPT copyright 2019 American Medical Association. All rights reserved. The codes documented in this report are preliminary and upon coder review may  be revised to meet current compliance requirements. Lionel December, MD Lionel December, MD 10/21/2021 10:42:18 AM This report has been signed electronically. Number of Addenda: 0

## 2021-10-21 NOTE — H&P (Signed)
[name] [name] is an 55 y.o. male.   Chief Complaint: Patient is here for colonoscopy and examination of rectal stump HPI: Patient is a 55 year old Caucasian male who underwent sigmoid colostomy in August 2020 when he presented with rectal perforation and peritonitis.  He is being considered for reversal of a colostomy.  Dr. Henreitta Leber requested screening colonoscopy.  Patient has developed large ventral hernia.  He denies melena or bleeding into colostomy.  He is not having rectal discharge.  He is still smoking cigarettes.  He says it calms his nerves.  His appetite is good. Family history is negative for CRC. Patient's urine drug screen was negative.  Past Medical History:  Diagnosis Date   Anxiety    Bipolar 1 disorder (HCC)    Chronic fatigue    Chronic pain    Complete intestinal obstruction (HCC)    Depression    Phreesia 12/03/2020   Fecal peritonitis (HCC) 08/03/2019   Fibromyalgia    Hyperlipidemia    Phreesia 12/03/2020   Hypertension    Ileus, postoperative (HCC)    Perforated rectum (HCC) 08/03/2019   Sleep apnea     Past Surgical History:  Procedure Laterality Date   COLON RESECTION SIGMOID  08/03/2019   Procedure: COLON RESECTION SIGMOID;  Surgeon: [name] Roers, MD;  Location: [facility];  Service: General;;   COLOSTOMY N/A 08/03/2019   Procedure: COLOSTOMY;  Surgeon: [name] Roers, MD;  Location: [facility];  Service: General;  Laterality: N/A;   FLEXIBLE SIGMOIDOSCOPY N/A 08/03/2019   Procedure: FLEXIBLE SIGMOIDOSCOPY;  Surgeon: [name] Ade, MD;  [facility];  Service: Endoscopy;  Laterality: N/A;   KNEE ARTHROSCOPY Left 2006   miniscus tear   LAPAROTOMY N/A 08/03/2019   Procedure: EXPLORATORY LAPAROTOMY;  Surgeon: [name] Roers, MD;  Location: [facility];  Service: General;  Laterality: N/A;   LYMPH GLAND EXCISION Left 1983   SMALL INTESTINE SURGERY N/A    Phreesia 12/03/2020   TONSILLECTOMY      Family History  Problem Relation Age of Onset    Diabetes Mother    Heart attack Father    Diabetes Maternal Grandmother    Colon cancer Neg Hx    Colon polyps Neg Hx    Social History:  reports that he has been smoking cigarettes. He has a 10.00 pack-year smoking history. He has never used smokeless tobacco. He reports that he does not currently use alcohol. He reports that he does not currently use drugs after having used the following drugs: Cocaine.  Allergies:  Allergies  Allergen Reactions   Codeine Shortness Of Breath and Swelling   Divalproex Sodium Diarrhea, Itching, Rash, Shortness Of Breath and Swelling   Erythromycin Hives, Itching, Rash and Swelling   Iodinated Diagnostic Agents Swelling    Patient states he received "red Dye" 15-20 years ago and become red/flushed. No other symptoms. Patient did do 13 hour pre meds for CT 09/17/2021   Latex Itching and Rash   Morphine And Related Shortness Of Breath and Swelling   Sulfa Antibiotics Diarrhea, Itching, Rash, Shortness Of Breath and Swelling   Wheat Bran Hives, Itching, Rash, Shortness Of Breath and Swelling   Azithromycin     Pt can take this as well, just finished a rx for this in 09/2021   Demerol [Meperidine]     Body over heats and pouring sweat   Levofloxacin     Pt states they can take this   Niacin And Related Rash  Penicillins Swelling and Rash    REACTION: Rash and facial swelling at age 33    Medications Prior to Admission  Medication Sig Dispense Refill   acetaminophen (TYLENOL) 500 MG tablet Take 1,000 mg by mouth every 6 (six) hours as needed for moderate pain.     cyclobenzaprine (FLEXERIL) 10 MG tablet Take 10 mg by mouth 2 (two) times daily.     DULoxetine (CYMBALTA) 60 MG capsule Take 60 mg by mouth daily.     gabapentin (NEURONTIN) 300 MG capsule Take 1 capsule (300 mg total) by mouth 3 (three) times daily. 90 capsule 3   hydrOXYzine (ATARAX/VISTARIL) 25 MG tablet TAKE (1) TABLET BY MOUTH TWICE DAILY AS NEEDED FOR ANXIETY. 60 tablet 1   nicotine  (NICODERM CQ - DOSED IN MG/24 HOURS) 21 mg/24hr patch Place 21 mg onto the skin daily.     risperidone (RISPERDAL) 4 MG tablet Take one at night 30 tablet 1   telmisartan (MICARDIS) 20 MG tablet Take 1 tablet (20 mg total) by mouth daily. 30 tablet 2   traZODone (DESYREL) 100 MG tablet TAKE (1) TABLET BY MOUTH ONCE AT BEDTIME. 30 tablet 1   meloxicam (MOBIC) 7.5 MG tablet Take 2 tablets (15 mg total) by mouth daily. (Patient not taking: No sig reported) 30 tablet 0    Results for orders placed or performed during the hospital encounter of 10/19/21 (from the past 48 hour(s))  Rapid urine drug screen (hospital performed)     Status: None   Collection Time: 10/19/21 11:40 AM  Result Value Ref Range   Opiates NONE DETECTED NONE DETECTED   Cocaine NONE DETECTED NONE DETECTED   Benzodiazepines NONE DETECTED NONE DETECTED   Amphetamines NONE DETECTED NONE DETECTED   Tetrahydrocannabinol NONE DETECTED NONE DETECTED   Barbiturates NONE DETECTED NONE DETECTED    Comment: (NOTE) DRUG SCREEN FOR MEDICAL PURPOSES ONLY.  IF CONFIRMATION IS NEEDED FOR ANY PURPOSE, NOTIFY LAB WITHIN 5 DAYS.  LOWEST DETECTABLE LIMITS FOR URINE DRUG SCREEN Drug Class                     Cutoff (ng/mL) Amphetamine and metabolites    1000 Barbiturate and metabolites    200 Benzodiazepine                 200 Tricyclics and metabolites     300 Opiates and metabolites        300 Cocaine and metabolites        300 THC                            50 Performed at [facility], [address] [place], [place] [address]    No results found.  Review of Systems  Blood pressure (!) 148/70, pulse 89, temperature 98 F (36.7 C), temperature source Oral, resp. rate 18, SpO2 97 %. Physical Exam HENT:     Mouth/Throat:     Mouth: Mucous membranes are moist.     Pharynx: Oropharynx is clear.  Eyes:     General: No scleral icterus.    Conjunctiva/sclera: Conjunctivae normal.  Cardiovascular:     Rate and Rhythm: Normal  rate and regular rhythm.     Heart sounds: Normal heart sounds. No murmur heard. Pulmonary:     Effort: Pulmonary effort is normal.     Breath sounds: Normal breath sounds.  Abdominal:     Comments: Patient has colostomy in left  lower quadrant.  He has a large ventral hernia to the right of lower midline scar.  He also has small umbilical hernia.  Abdomen is soft and nontender with organomegaly or masses.  Musculoskeletal:        General: No swelling.     Cervical back: Neck supple.  Lymphadenopathy:     Cervical: No cervical adenopathy.  Skin:    General: Skin is warm and dry.  Neurological:     Mental Status: He is alert.     Assessment/Plan  Average risk screening colonoscopy. Patient has colostomy which was performed over 2 years ago for rectal perforation.   Lionel December, MD 10/21/2021, 10:02 AM

## 2021-10-21 NOTE — Anesthesia Preprocedure Evaluation (Signed)
Anesthesia Evaluation  Patient identified by MRN, date of birth, ID band Patient awake    Reviewed: Allergy & Precautions, NPO status , Patient's Chart, lab work & pertinent test results  History of Anesthesia Complications Negative for: history of anesthetic complications  Airway Mallampati: II  TM Distance: >3 FB Neck ROM: Full    Dental  (+) Dental Advisory Given, Missing, Poor Dentition, Chipped   Pulmonary sleep apnea , Current Smoker,    Pulmonary exam normal breath sounds clear to auscultation       Cardiovascular hypertension, Pt. on medications Normal cardiovascular exam Rhythm:Regular Rate:Normal     Neuro/Psych PSYCHIATRIC DISORDERS Anxiety Depression Bipolar Disorder  Neuromuscular disease    GI/Hepatic negative GI ROS, (+)     substance abuse  cocaine use, marijuana use and methamphetamine use,   Endo/Other  negative endocrine ROS  Renal/GU negative Renal ROS     Musculoskeletal  (+) Fibromyalgia -  Abdominal   Peds  Hematology negative hematology ROS (+)   Anesthesia Other Findings   Reproductive/Obstetrics                           Anesthesia Physical Anesthesia Plan  ASA: 3  Anesthesia Plan: General   Post-op Pain Management:    Induction: Intravenous  PONV Risk Score and Plan: TIVA  Airway Management Planned: Nasal Cannula and Natural Airway  Additional Equipment:   Intra-op Plan:   Post-operative Plan:   Informed Consent: I have reviewed the patients History and Physical, chart, labs and discussed the procedure including the risks, benefits and alternatives for the proposed anesthesia with the patient or authorized representative who has indicated his/her understanding and acceptance.     Dental advisory given  Plan Discussed with: CRNA and Surgeon  Anesthesia Plan Comments:         Anesthesia Quick Evaluation

## 2021-10-23 ENCOUNTER — Encounter (HOSPITAL_COMMUNITY): Payer: Self-pay | Admitting: Internal Medicine

## 2021-10-27 DIAGNOSIS — G894 Chronic pain syndrome: Secondary | ICD-10-CM | POA: Diagnosis not present

## 2021-10-27 DIAGNOSIS — I10 Essential (primary) hypertension: Secondary | ICD-10-CM | POA: Diagnosis not present

## 2021-10-27 DIAGNOSIS — Z79899 Other long term (current) drug therapy: Secondary | ICD-10-CM | POA: Diagnosis not present

## 2021-10-27 DIAGNOSIS — M797 Fibromyalgia: Secondary | ICD-10-CM | POA: Diagnosis not present

## 2021-10-27 DIAGNOSIS — F419 Anxiety disorder, unspecified: Secondary | ICD-10-CM | POA: Diagnosis not present

## 2021-10-27 DIAGNOSIS — M25561 Pain in right knee: Secondary | ICD-10-CM | POA: Diagnosis not present

## 2021-10-28 ENCOUNTER — Other Ambulatory Visit (HOSPITAL_COMMUNITY): Payer: Self-pay | Admitting: Psychiatry

## 2021-10-28 ENCOUNTER — Other Ambulatory Visit: Payer: Self-pay | Admitting: Internal Medicine

## 2021-10-28 DIAGNOSIS — F419 Anxiety disorder, unspecified: Secondary | ICD-10-CM

## 2021-10-28 DIAGNOSIS — I10 Essential (primary) hypertension: Secondary | ICD-10-CM

## 2021-10-29 NOTE — Telephone Encounter (Signed)
Last refill--09/24/21 Last OV--09/24/21 Next appt--12/04/21

## 2021-11-17 ENCOUNTER — Encounter: Payer: Self-pay | Admitting: General Surgery

## 2021-11-17 ENCOUNTER — Ambulatory Visit (INDEPENDENT_AMBULATORY_CARE_PROVIDER_SITE_OTHER): Payer: PPO | Admitting: General Surgery

## 2021-11-17 ENCOUNTER — Other Ambulatory Visit: Payer: Self-pay

## 2021-11-17 VITALS — BP 159/89 | HR 89 | Temp 98.7°F | Resp 18 | Ht 71.0 in | Wt 239.0 lb

## 2021-11-17 DIAGNOSIS — Z933 Colostomy status: Secondary | ICD-10-CM

## 2021-11-17 DIAGNOSIS — K432 Incisional hernia without obstruction or gangrene: Secondary | ICD-10-CM | POA: Diagnosis not present

## 2021-11-17 MED ORDER — NEOMYCIN SULFATE 500 MG PO TABS: 1000.0000 mg | ORAL_TABLET | ORAL | 0 refills | Status: DC

## 2021-11-17 MED ORDER — METRONIDAZOLE 500 MG PO TABS: 1000.0000 mg | ORAL_TABLET | ORAL | 0 refills | Status: DC

## 2021-11-17 NOTE — Patient Instructions (Signed)
Colon Preparation:   Buy from the Store: Miralax bottle (288g).  Gatorade 64 oz (not red). Dulcolax tablets.   The Day Prior to Surgery: Take 4 ducolax tablets at 7am with water. Do an enema through your rectum at 8AM. Drink plenty of clear liquids all day to avoid dehydration, no solid food.    Mix the bottle of Miralax and 64 oz of Gatorade and drink this mixture starting at 10am.  Drink it gradually over the next few hours, 8 ounces every 15-30 minutes until it is gone. Finish this by 2pm.  Repeat an enema at 2pm.  Take 2 neomycin 500mg tablets and 2 metronidazole 500mg tablets at 2 pm. Take 2 neomycin 500mg tablets and 2 metronidazole 500mg tablets at 3pm. Take 2 neomycin 500mg tablets and 2 metronidazole 500mg tablets at 10pm.    Do not eat or drink anything after midnight the night before your surgery.  Do not eat or drink anything that morning, and take medications as instructed by the hospital staff on your preoperative visit.    Colostomy Reversal Surgery A colostomy reversal is a surgical procedure that is done to reverse a colostomy. In this reversal procedure, the large intestine is disconnected from the opening in the abdomen (stoma). Then, the changes that were made to the intestine during the colostomy will be reversed to restore the flow of stool through the entire intestine. Depending on the type of colostomy being reversed, this may involve one of the following: Reconnecting the two ends of the intestine that were separated during colostomy surgery. Closing the opening that was made in the side of the intestine to allow stool to be redirected through the stoma. After this surgery, a stoma and colostomy bag are no longer needed. Stool (feces) can leave your body through the rectum, as it did before you had a colostomy. Tell a health care provider about: Any allergies you have. All medicines you are taking, including vitamins, herbs, eye drops, creams, and over-the-counter  medicines. Any problems you or family members have had with anesthetic medicines. Any blood disorders you have. Any surgeries you have had. Any medical conditions you have. Whether you are pregnant or may be pregnant. What are the risks? Generally, this is a safe procedure. However, problems may occur, including: Infection. Bleeding. Allergic reactions to medicines. Damage to other structures or organs. A temporary condition in which the intestines stop moving and working correctly (ileus). This usually goes away in 3-7 days. A collection of pus (abscess) in the abdomen or pelvis. Intestinal blockage. Leaking at the area of the intestine where it was reconnected (anastomotic leak) or where the opening of the stoma was closed. Narrowing of the intestine (stricture) at the place where it was reconnected. Urinary and sexual dysfunction. What happens before the procedure? Medicines Ask your health care provider about: Changing or stopping your regular medicines. This is especially important if you are taking diabetes medicines or blood thinners. Taking medicines such as aspirin and ibuprofen. These medicines can thin your blood. Do not take these medicines unless your health care provider tells you to take them. Taking over-the-counter medicines, vitamins, herbs, and supplements. General instructions You may have an exam or testing. Plan to have someone take you home after the procedure. Plan to have a responsible adult care for you for at least 24 hours after you leave the hospital or clinic. This is important. Do not use any products that contain nicotine or tobacco, such as cigarettes, e-cigarettes, and chewing tobacco.   These can delay incision healing after surgery. If you need help quitting, ask your health care provider. Ask your health care provider what steps will be taken to help prevent infection. These may include: Removing hair at the surgery site. Washing skin with a  germ-killing soap. Antibiotic medicine. What happens during the procedure?  An IV will be inserted into one of your veins. You may be given: A medicine to help you relax (sedative). A medicine to make you fall asleep (general anesthetic). An incision will be made in your abdomen at the site of the stoma. The large intestine will be disconnected from the abdomen at the site of the stoma. The next steps will vary depending on the type of colostomy reversal surgery you are having. There are two main types: End colostomy reversal. The surgeon will use stitches (sutures) or staples to reconnect the two ends of the intestine that were separated during the end colostomy. Loop colostomy reversal. The surgeon will use sutures or staples to close the opening in the intestine that had been allowing stool to be redirected through the stoma. The intestine will then be put back into its normal position inside the abdomen. The incision will be closed with sutures, skin glue, or adhesive strips. It may be covered with bandages (dressings). The procedure may vary among health care providers and hospitals. What happens after the procedure? Your blood pressure, heart rate, breathing rate, and blood oxygen level will be monitored until you leave the hospital or clinic. You will be given pain medicine as needed. You will slowly increase your diet and movement as told by your health care provider. Summary A colostomy reversal is a surgical procedure that is done to reverse a colostomy. After this surgery, stool (feces) can leave your body through the rectum, as it did before you had a colostomy. Before the procedure, follow instructions from your health care provider about taking medicines and about eating and drinking. During the procedure, your colostomy will be reversed, and the incision will be closed with sutures, skin glue, or adhesive strips. It may be covered with bandages (dressings). After the procedure,  you will slowly increase your diet and movement as told by your health care provider. This information is not intended to replace advice given to you by your health care provider. Make sure you discuss any questions you have with your healthcare provider. Document Revised: 05/31/2018 Document Reviewed: 05/31/2018 Elsevier Patient Education  2022 [employer]  

## 2021-11-18 NOTE — Progress Notes (Signed)
[facility] History and Physical   Chief Complaint   Follow-up     [name] [name] is a 55 y.o. male.  HPI: [name] [name] is known to me after rectal perforation and colostomy. He has been in a better social position lately and is feeling better. I had originally thought his rectal stump was minimal but on recent CT it is at least 10 cm. He wants to get reversed. He has had a colonoscopy. He has a large ventral hernia this cases him discomfort. His ostomy function wells.  [name] [name] has been cutting back on smoking, is stable with his psychiatric medication and follow up and is doing much better in his social situation.  He has a good appetite and denies any other major changes, no CP or SOB.    Past Medical History:  Diagnosis Date   Anxiety    Bipolar 1 disorder (HCC)    Chronic fatigue    Chronic pain    Complete intestinal obstruction (HCC)    Depression    Phreesia 12/03/2020   Fecal peritonitis (HCC) 08/03/2019   Fibromyalgia    Hyperlipidemia    Phreesia 12/03/2020   Hypertension    Ileus, postoperative (HCC)    Perforated rectum (HCC) 08/03/2019   Sleep apnea     Past Surgical History:  Procedure Laterality Date   COLON RESECTION SIGMOID  08/03/2019   Procedure: COLON RESECTION SIGMOID;  Surgeon: Lucretia Roers, MD;  Location: [facility];  Service: General;;   COLONOSCOPY WITH PROPOFOL N/A 10/21/2021   Procedure: COLONOSCOPY WITH PROPOFOL;  Surgeon: Malissa Hippo, MD;  Location: [facility];  Service: Endoscopy;  Laterality: N/A;  955   COLOSTOMY N/A 08/03/2019   Procedure: COLOSTOMY;  Surgeon: Lucretia Roers, MD;  Location: [facility];  Service: General;  Laterality: N/A;   FLEXIBLE SIGMOIDOSCOPY N/A 08/03/2019   Procedure: FLEXIBLE SIGMOIDOSCOPY;  Surgeon: Corbin Ade, MD;  Location: [facility];  Service: Endoscopy;  Laterality: N/A;   KNEE ARTHROSCOPY Left 2006   miniscus tear   LAPAROTOMY N/A 08/03/2019   Procedure: EXPLORATORY LAPAROTOMY;  Surgeon:  Lucretia Roers, MD;  Location: [facility];  Service: General;  Laterality: N/A;   LYMPH GLAND EXCISION Left 1983   SMALL INTESTINE SURGERY N/A    Phreesia 12/03/2020   TONSILLECTOMY      Family History  Problem Relation Age of Onset   Diabetes Mother    Heart attack Father    Diabetes Maternal Grandmother    Colon cancer Neg Hx    Colon polyps Neg Hx     Social History   Tobacco Use   Smoking status: Every Day    Packs/day: 1.00    Years: 10.00    Pack years: 10.00    Types: Cigarettes   Smokeless tobacco: Never  Vaping Use   Vaping Use: Some days  Substance Use Topics   Alcohol use: Not Currently    Comment: occasional; denied 10/02/19   Drug use: Not Currently    Types: Cocaine    Comment: last used in December 2020.     Medications: I have reviewed the patient's current medications. Allergies as of 11/17/2021       Reactions   Codeine Shortness Of Breath, Swelling   Divalproex Sodium Diarrhea, Itching, Rash, Shortness Of Breath, Swelling   Erythromycin Hives, Itching, Rash, Swelling   Iodinated Diagnostic Agents Swelling   Patient states he received "red Dye" 15-20 years ago and become red/flushed. No other symptoms.  Patient did do 13 hour pre meds for CT 09/17/2021   Latex Itching, Rash   Morphine And Related Shortness Of Breath, Swelling   Sulfa Antibiotics Diarrhea, Itching, Rash, Shortness Of Breath, Swelling   Wheat Bran Hives, Itching, Rash, Shortness Of Breath, Swelling   Azithromycin    Pt can take this as well, just finished a rx for this in 09/2021   Demerol [meperidine]    Body over heats and pouring sweat   Levofloxacin    Pt states they can take this   Niacin And Related Rash   Penicillins Swelling, Rash   REACTION: Rash and facial swelling at age 59        Medication List        Accurate as of November 17, 2021 11:59 PM. If you have any questions, ask your nurse or doctor.          STOP taking these medications    traZODone 100  MG tablet Commonly known as: DESYREL Stopped by: Lucretia Roers, MD       TAKE these medications    acetaminophen 500 MG tablet Commonly known as: TYLENOL Take 1,000 mg by mouth every 6 (six) hours as needed for moderate pain.   cyclobenzaprine 10 MG tablet Commonly known as: FLEXERIL Take 10 mg by mouth 2 (two) times daily.   DULoxetine 60 MG capsule Commonly known as: CYMBALTA Take 60 mg by mouth daily.   gabapentin 300 MG capsule Commonly known as: Neurontin Take 1 capsule (300 mg total) by mouth 3 (three) times daily.   hydrOXYzine 25 MG tablet Commonly known as: ATARAX/VISTARIL TAKE (1) TABLET BY MOUTH TWICE DAILY AS NEEDED FOR ANXIETY.   metroNIDAZOLE 500 MG tablet Commonly known as: Flagyl Take 2 tablets (1,000 mg total) by mouth as directed. Take 2 flagyl 500mg  tablets at 2 pm, 3pm, and 10 pm the day before surgery. Started by: , MD   neomycin 500 MG tablet Commonly known as: MYCIFRADIN Take 2 tablets (1,000 mg total) by mouth as directed. Take 2 neomycin 500mg  tablets at 2 pm, 3pm, and 10 pm the day before surgery. Started by: Lucretia Roers, MD   nicotine 21 mg/24hr patch Commonly known as: NICODERM CQ - dosed in mg/24 hours Place 21 mg onto the skin daily.   risperidone 4 MG tablet Commonly known as: RISPERDAL Take one at night   telmisartan 20 MG tablet Commonly known as: MICARDIS TAKE (1) TABLET BY MOUTH ONCE A DAY.   traMADol 50 MG tablet Commonly known as: ULTRAM Take 50 mg by mouth in the morning, at noon, in the evening, and at bedtime.         ROS:  A comprehensive review of systems was negative except for: Gastrointestinal: positive for abdominal pain and hernia  Blood pressure (!) 159/89, pulse 89, temperature 98.7 F (37.1 C), temperature source Other (Comment), resp. rate 18, height 5\' 11"  (1.803 m), weight 239 lb (108.4 kg), SpO2 97 %. Physical Exam Vitals reviewed.  Constitutional:      Appearance:  Normal appearance.  HENT:     Head: Normocephalic.     Mouth/Throat:     Mouth: Mucous membranes are moist.  Eyes:     Extraocular Movements: Extraocular movements intact.  Cardiovascular:     Rate and Rhythm: Normal rate and regular rhythm.  Pulmonary:     Effort: Pulmonary effort is normal.     Breath sounds: Normal breath sounds.  Abdominal:  General: There is no distension.     Palpations: Abdomen is soft.     Tenderness: There is abdominal tenderness.     Hernia: A hernia is present.     Comments: Large ventral  hernia and parastomal hernia, reducible, tender  Musculoskeletal:        General: Normal range of motion.     Cervical back: Normal range of motion.  Skin:    General: Skin is warm.  Neurological:     General: No focal deficit present.     Mental Status: He is alert and oriented to person, place, and time.  Psychiatric:        Mood and Affect: Mood normal.        Behavior: Behavior normal.        Thought Content: Thought content normal.        Judgment: Judgment normal.    Results: None   Assessment & Plan:  [name] [name] is a 55 y.o. male with known ventral hernia and colostomy in place. He had a rectal perforation a few years ago and emergency colostomy.  He has been stable from his psychiatric standpoint and is feeling good. His social situation is good with his mother and ex-wife supporting him. He is not using any illegal drugs. He is cutting back on smoking and is down to 5 cigarettes a day and plans to be off by the operation.    Discussed risk of bleeding, infection, anastomotic leak, ventral hernia repair with absorbable mesh, likely ventral hernia recurrence, hospital stay, blood use if needed.   Will try to get a absorbable phaxis mesh to use for this case.   Colon Preparation:  Buy from the Store: Miralax bottle [phone]).  Gatorade 64 oz (not red). Dulcolax tablets.   The Day Prior to Surgery: Take 4 ducolax tablets at 7am with water. Do an  enema through your rectum at 8AM. Drink plenty of clear liquids all day to avoid dehydration, no solid food.    Mix the bottle of Miralax and 64 oz of Gatorade and drink this mixture starting at 10am.  Drink it gradually over the next few hours, 8 ounces every 15-30 minutes until it is gone. Finish this by 2pm.  Repeat an enema at 2pm.  Take 2 neomycin 500mg  tablets and 2 metronidazole 500mg  tablets at 2 pm. Take 2 neomycin 500mg  tablets and 2 metronidazole 500mg  tablets at 3pm. Take 2 neomycin 500mg  tablets and 2 metronidazole 500mg  tablets at 10pm.    Do not eat or drink anything after midnight the night before your surgery.  Do not eat or drink anything that morning, and take medications as instructed by the hospital staff on your preoperative visit.     All questions were answered to the satisfaction of the patient.     11/18/2021, 5:10 PM

## 2021-11-23 ENCOUNTER — Telehealth (HOSPITAL_COMMUNITY): Payer: Self-pay | Admitting: Psychiatry

## 2021-11-23 MED ORDER — BUSPIRONE HCL 7.5 MG PO TABS
7.5000 mg | ORAL_TABLET | Freq: Every day | ORAL | 0 refills | Status: DC
Start: 2021-11-23 — End: 2022-01-14

## 2021-11-23 NOTE — Telephone Encounter (Signed)
Pt left vm stating he needs something to help with panic attacks he is grinding his teeth 3 times a day.  Next appt is on 12/04/21

## 2021-11-23 NOTE — H&P (Signed)
[facility] History and Physical   Chief Complaint   Follow-up     [name] [name] is a 55 y.o. male.  HPI: [name] [name] is known to me after rectal perforation and colostomy. He has been in a better social position lately and is feeling better. I had originally thought his rectal stump was minimal but on recent CT it is at least 10 cm. He wants to get reversed. He has had a colonoscopy. He has a large ventral hernia this cases him discomfort. His ostomy function wells.  [name] [name] has been cutting back on smoking, is stable with his psychiatric medication and follow up and is doing much better in his social situation.  He has a good appetite and denies any other major changes, no CP or SOB.    Past Medical History:  Diagnosis Date   Anxiety    Bipolar 1 disorder (HCC)    Chronic fatigue    Chronic pain    Complete intestinal obstruction (HCC)    Depression    Phreesia 12/03/2020   Fecal peritonitis (HCC) 08/03/2019   Fibromyalgia    Hyperlipidemia    Phreesia 12/03/2020   Hypertension    Ileus, postoperative (HCC)    Perforated rectum (HCC) 08/03/2019   Sleep apnea     Past Surgical History:  Procedure Laterality Date   COLON RESECTION SIGMOID  08/03/2019   Procedure: COLON RESECTION SIGMOID;  Surgeon: Jakwan Sally C, MD;  Location: [facility];  Service: General;;   COLONOSCOPY WITH PROPOFOL N/A 10/21/2021   Procedure: COLONOSCOPY WITH PROPOFOL;  Surgeon: Rehman, Najeeb U, MD;  Location: [facility];  Service: Endoscopy;  Laterality: N/A;  955   COLOSTOMY N/A 08/03/2019   Procedure: COLOSTOMY;  Surgeon: Jacquese Hackman C, MD;  Location: [facility];  Service: General;  Laterality: N/A;   FLEXIBLE SIGMOIDOSCOPY N/A 08/03/2019   Procedure: FLEXIBLE SIGMOIDOSCOPY;  Surgeon: Rourk, Robert M, MD;  Location: [facility];  Service: Endoscopy;  Laterality: N/A;   KNEE ARTHROSCOPY Left 2006   miniscus tear   LAPAROTOMY N/A 08/03/2019   Procedure: EXPLORATORY LAPAROTOMY;  Surgeon:  Tyleah Loh C, MD;  Location: [facility];  Service: General;  Laterality: N/A;   LYMPH GLAND EXCISION Left 1983   SMALL INTESTINE SURGERY N/A    Phreesia 12/03/2020   TONSILLECTOMY      Family History  Problem Relation Age of Onset   Diabetes Mother    Heart attack Father    Diabetes Maternal Grandmother    Colon cancer Neg Hx    Colon polyps Neg Hx     Social History   Tobacco Use   Smoking status: Every Day    Packs/day: 1.00    Years: 10.00    Pack years: 10.00    Types: Cigarettes   Smokeless tobacco: Never  Vaping Use   Vaping Use: Some days  Substance Use Topics   Alcohol use: Not Currently    Comment: occasional; denied 10/02/19   Drug use: Not Currently    Types: Cocaine    Comment: last used in December 2020.     Medications: I have reviewed the patient's current medications. Allergies as of 11/17/2021       Reactions   Codeine Shortness Of Breath, Swelling   Divalproex Sodium Diarrhea, Itching, Rash, Shortness Of Breath, Swelling   Erythromycin Hives, Itching, Rash, Swelling   Iodinated Diagnostic Agents Swelling   Patient states he received "red Dye" 15-20 years ago and become red/flushed. No other symptoms.   Patient did do 13 hour pre meds for CT 09/17/2021   Latex Itching, Rash   Morphine And Related Shortness Of Breath, Swelling   Sulfa Antibiotics Diarrhea, Itching, Rash, Shortness Of Breath, Swelling   Wheat Bran Hives, Itching, Rash, Shortness Of Breath, Swelling   Azithromycin    Pt can take this as well, just finished a rx for this in 09/2021   Demerol [meperidine]    Body over heats and pouring sweat   Levofloxacin    Pt states they can take this   Niacin And Related Rash   Penicillins Swelling, Rash   REACTION: Rash and facial swelling at age 12        Medication List        Accurate as of November 17, 2021 11:59 PM. If you have any questions, ask your nurse or doctor.          STOP taking these medications    traZODone 100  MG tablet Commonly known as: DESYREL Stopped by: Damariz Paganelli C Lorrain Rivers, MD       TAKE these medications    acetaminophen 500 MG tablet Commonly known as: TYLENOL Take 1,000 mg by mouth every 6 (six) hours as needed for moderate pain.   cyclobenzaprine 10 MG tablet Commonly known as: FLEXERIL Take 10 mg by mouth 2 (two) times daily.   DULoxetine 60 MG capsule Commonly known as: CYMBALTA Take 60 mg by mouth daily.   gabapentin 300 MG capsule Commonly known as: Neurontin Take 1 capsule (300 mg total) by mouth 3 (three) times daily.   hydrOXYzine 25 MG tablet Commonly known as: ATARAX/VISTARIL TAKE (1) TABLET BY MOUTH TWICE DAILY AS NEEDED FOR ANXIETY.   metroNIDAZOLE 500 MG tablet Commonly known as: Flagyl Take 2 tablets (1,000 mg total) by mouth as directed. Take 2 flagyl 500mg tablets at 2 pm, 3pm, and 10 pm the day before surgery. Started by: Porchia Sinkler C Jajuan Skoog, MD   neomycin 500 MG tablet Commonly known as: MYCIFRADIN Take 2 tablets (1,000 mg total) by mouth as directed. Take 2 neomycin 500mg tablets at 2 pm, 3pm, and 10 pm the day before surgery. Started by: Azalee Weimer C Supriya Beaston, MD   nicotine 21 mg/24hr patch Commonly known as: NICODERM CQ - dosed in mg/24 hours Place 21 mg onto the skin daily.   risperidone 4 MG tablet Commonly known as: RISPERDAL Take one at night   telmisartan 20 MG tablet Commonly known as: MICARDIS TAKE (1) TABLET BY MOUTH ONCE A DAY.   traMADol 50 MG tablet Commonly known as: ULTRAM Take 50 mg by mouth in the morning, at noon, in the evening, and at bedtime.         ROS:  A comprehensive review of systems was negative except for: Gastrointestinal: positive for abdominal pain and hernia  Blood pressure (!) 159/89, pulse 89, temperature 98.7 F (37.1 C), temperature source Other (Comment), resp. rate 18, height 5' 11" (1.803 m), weight 239 lb (108.4 kg), SpO2 97 %. Physical Exam Vitals reviewed.  Constitutional:      Appearance:  Normal appearance.  HENT:     Head: Normocephalic.     Mouth/Throat:     Mouth: Mucous membranes are moist.  Eyes:     Extraocular Movements: Extraocular movements intact.  Cardiovascular:     Rate and Rhythm: Normal rate and regular rhythm.  Pulmonary:     Effort: Pulmonary effort is normal.     Breath sounds: Normal breath sounds.  Abdominal:       General: There is no distension.     Palpations: Abdomen is soft.     Tenderness: There is abdominal tenderness.     Hernia: A hernia is present.     Comments: Large ventral  hernia and parastomal hernia, reducible, tender  Musculoskeletal:        General: Normal range of motion.     Cervical back: Normal range of motion.  Skin:    General: Skin is warm.  Neurological:     General: No focal deficit present.     Mental Status: He is alert and oriented to person, place, and time.  Psychiatric:        Mood and Affect: Mood normal.        Behavior: Behavior normal.        Thought Content: Thought content normal.        Judgment: Judgment normal.    Results: None   Assessment & Plan:  [name] [name] is a 55 y.o. male with known ventral hernia and colostomy in place. He had a rectal perforation a few years ago and emergency colostomy.  He has been stable from his psychiatric standpoint and is feeling good. His social situation is good with his mother and ex-wife supporting him. He is not using any illegal drugs. He is cutting back on smoking and is down to 5 cigarettes a day and plans to be off by the operation.    Discussed risk of bleeding, infection, anastomotic leak, ventral hernia repair with absorbable mesh, likely ventral hernia recurrence, hospital stay, blood use if needed.   Will try to get a absorbable phaxis mesh to use for this case.   Colon Preparation:  Buy from the Store: Miralax bottle (288g).  Gatorade 64 oz (not red). Dulcolax tablets.   The Day Prior to Surgery: Take 4 ducolax tablets at 7am with water. Do an  enema through your rectum at 8AM. Drink plenty of clear liquids all day to avoid dehydration, no solid food.    Mix the bottle of Miralax and 64 oz of Gatorade and drink this mixture starting at 10am.  Drink it gradually over the next few hours, 8 ounces every 15-30 minutes until it is gone. Finish this by 2pm.  Repeat an enema at 2pm.  Take 2 neomycin 500mg tablets and 2 metronidazole 500mg tablets at 2 pm. Take 2 neomycin 500mg tablets and 2 metronidazole 500mg tablets at 3pm. Take 2 neomycin 500mg tablets and 2 metronidazole 500mg tablets at 10pm.    Do not eat or drink anything after midnight the night before your surgery.  Do not eat or drink anything that morning, and take medications as instructed by the hospital staff on your preoperative visit.     All questions were answered to the satisfaction of the patient.     Tabari Volkert C Leia Coletti 11/18/2021, 5:10 PM         instructed by the hospital staff on your preoperative visit.        All questions were answered to the satisfaction of the patient.        Lucretia Roers 11/18/2021, 5:10 PM

## 2021-11-23 NOTE — Telephone Encounter (Signed)
I told patient what you said in previous message. Patient says you told him to stop taking Vistaril because it was making him jerk and shake. He needs something for anxiety, because he is worried about upcoming surgery and he has to quit smoking so he has anxiety about everything going on. Please advise  CB# [phone]

## 2021-11-26 ENCOUNTER — Other Ambulatory Visit (HOSPITAL_COMMUNITY): Payer: Self-pay | Admitting: Psychiatry

## 2021-11-30 ENCOUNTER — Other Ambulatory Visit: Payer: Self-pay

## 2021-11-30 ENCOUNTER — Encounter: Payer: Self-pay | Admitting: Internal Medicine

## 2021-11-30 ENCOUNTER — Ambulatory Visit (INDEPENDENT_AMBULATORY_CARE_PROVIDER_SITE_OTHER): Payer: PPO | Admitting: Internal Medicine

## 2021-11-30 DIAGNOSIS — J0100 Acute maxillary sinusitis, unspecified: Secondary | ICD-10-CM

## 2021-11-30 DIAGNOSIS — I10 Essential (primary) hypertension: Secondary | ICD-10-CM | POA: Diagnosis not present

## 2021-11-30 MED ORDER — TELMISARTAN 20 MG PO TABS: ORAL_TABLET | ORAL | 0 refills | Status: DC

## 2021-11-30 MED ORDER — NOREL AD 4-10-325 MG PO TABS: 1.0000 | ORAL_TABLET | Freq: Three times a day (TID) | ORAL | 0 refills | Status: DC | PRN

## 2021-11-30 MED ORDER — AZITHROMYCIN 250 MG PO TABS: ORAL_TABLET | ORAL | 0 refills | Status: AC

## 2021-11-30 NOTE — Progress Notes (Signed)
Virtual Visit via Telephone Note   This visit type was conducted due to national recommendations for restrictions regarding the COVID-19 Pandemic (e.g. social distancing) in an effort to limit this patient's exposure and mitigate transmission in our community.  Due to his co-morbid illnesses, this patient is at least at moderate risk for complications without adequate follow up.  This format is felt to be most appropriate for this patient at this time.  The patient did not have access to video technology/had technical difficulties with video requiring transitioning to audio format only (telephone).  All issues noted in this document were discussed and addressed.  No physical exam could be performed with this format.  Evaluation Performed:  Follow-up visit  Date:  11/30/2021   ID:  [name] [name], DOB 1966-06-30, MRN [id number]  Patient Location: Home Provider Location: Office/Clinic  Participants: Patient Location of Patient: Home Location of Provider: Telehealth Consent was obtain for visit to be over via telehealth. I verified that I am speaking with the correct person using two identifiers.  PCP:  Anabel Halon, MD   Chief Complaint: Cough and runny nose  History of Present Illness:    [name] [name] is a 55 y.o. male who has a televisit for c/o cough, runny nose, postnasal drip and fever for last 5 days. He denies any dyspnea or wheezing currently.  He has colostomy reversal surgery planned after a week.  The patient does not have symptoms concerning for COVID-19 infection (fever, chills, cough, or new shortness of breath).   Past Medical, Surgical, Social History, Allergies, and Medications have been Reviewed.  Past Medical History:  Diagnosis Date   Anxiety    Bipolar 1 disorder (HCC)    Chronic fatigue    Chronic pain    Complete intestinal obstruction (HCC)    Depression    [employer]rees[employer] 12/03/2020   Fecal peritonitis (HCC) 08/03/2019   Fibromyalgia    Hyperlipidemia     [employer] 12/03/2020   Hypertension    Ileus, postoperative (HCC)    Perforated rectum (HCC) 08/03/2019   Sleep apnea    Past Surgical History:  Procedure Laterality Date   COLON RESECTION SIGMOID  08/03/2019   Procedure: COLON RESECTION SIGMOID;  Surgeon: Lucretia Roers, MD;  Location: [facility];  Service: General;;   COLONOSCOPY WITH PROPOFOL N/A 10/21/2021   Procedure: COLONOSCOPY WITH PROPOFOL;  Surgeon: Malissa Hippo, MD;  Location: [facility];  Service: Endoscopy;  Laterality: N/A;  955   COLOSTOMY N/A 08/03/2019   Procedure: COLOSTOMY;  Surgeon: Lucretia Roers, MD;  Location: [facility];  Service: General;  Laterality: N/A;   FLEXIBLE SIGMOIDOSCOPY N/A 08/03/2019   Procedure: FLEXIBLE SIGMOIDOSCOPY;  Surgeon: Corbin Ade, MD;  Location: [facility];  Service: Endoscopy;  Laterality: N/A;   KNEE ARTHROSCOPY Left 2006   miniscus tear   LAPAROTOMY N/A 08/03/2019   Procedure: EXPLORATORY LAPAROTOMY;  Surgeon: Lucretia Roers, MD;  Location: [facility];  Service: General;  Laterality: N/A;   LYMPH GLAND EXCISION Left 1983   SMALL INTESTINE SURGERY N/A    [employer] 12/03/2020   TONSILLECTOMY       Current Meds  Medication Sig   acetaminophen (TYLENOL) 500 MG tablet Take 500 mg by mouth every 6 (six) hours as needed for moderate pain.   busPIRone (BUSPAR) 7.5 MG tablet Take 1 tablet (7.5 mg total) by mouth daily.   cyclobenzaprine (FLEXERIL) 10 MG tablet Take 10 mg by mouth 2 (two)  times daily.   DULoxetine (CYMBALTA) 60 MG capsule Take 60 mg by mouth daily.   gabapentin (NEURONTIN) 300 MG capsule Take 1 capsule (300 mg total) by mouth 3 (three) times daily.   hydrOXYzine (ATARAX/VISTARIL) 25 MG tablet TAKE (1) TABLET BY MOUTH TWICE DAILY AS NEEDED FOR ANXIETY.   metroNIDAZOLE (FLAGYL) 500 MG tablet Take 2 tablets (1,000 mg total) by mouth as directed. Take 2 flagyl 500mg  tablets at 2 pm, 3pm, and 10 pm the day before surgery.   neomycin (MYCIFRADIN) 500 MG tablet Take 2 tablets  (1,000 mg total) by mouth as directed. Take 2 neomycin 500mg  tablets at 2 pm, 3pm, and 10 pm the day before surgery.   nicotine (NICODERM CQ - DOSED IN MG/24 HOURS) 21 mg/24hr patch Place 21 mg onto the skin daily.   risperidone (RISPERDAL) 4 MG tablet TAKE (1) TABLET BY MOUTH AT BEDTIME.   telmisartan (MICARDIS) 20 MG tablet TAKE (1) TABLET BY MOUTH ONCE A DAY.   traMADol (ULTRAM) 50 MG tablet Take 50 mg by mouth 4 (four) times daily.     Allergies:   Codeine, Divalproex sodium, Erythromycin, Iodinated diagnostic agents, Latex, Morphine and related, Sulfa antibiotics, Wheat bran, Azithromycin, Demerol [meperidine], Levofloxacin, Niacin and related, and Penicillins   ROS:   Please see the history of present illness.     All other systems reviewed and are negative.   Labs/Other Tests and Data Reviewed:    Recent Labs: 06/01/2021: ALT 16; BUN 8; Hemoglobin 15.2; Platelets 210; Potassium 4.7; Sodium 137; TSH 1.470 09/17/2021: Creatinine, Ser 0.90   Recent Lipid Panel Lab Results  Component Value Date/Time   CHOL 180 06/01/2021 08:33 AM   TRIG 235 (H) 06/01/2021 08:33 AM   HDL 48 06/01/2021 08:33 AM   CHOLHDL 3.8 06/01/2021 08:33 AM   CHOLHDL 4.2 04/09/2021 06:38 AM   LDLCALC 92 06/01/2021 08:33 AM    Wt Readings from Last 3 Encounters:  11/17/21 239 lb (108.4 kg)  10/19/21 230 lb (104.3 kg)  10/07/21 235 lb 6 oz (106.8 kg)     ASSESSMENT & PLAN:    Acute sinusitis Started azithromycin Norel AD started for nasal congestion Nasal saline spray as needed for nasal congestion Advised to maintain adequate hydration   Time:   Today, I have spent 9 minutes reviewing the chart, including problem list, medications, and with the patient with telehealth technology discussing the above problems.   Medication Adjustments/Labs and Tests Ordered: Current medicines are reviewed at length with the patient today.  Concerns regarding medicines are outlined above.   Tests Ordered: No  orders of the defined types were placed in this encounter.   Medication Changes: No orders of the defined types were placed in this encounter.    Note: This dictation was prepared with Dragon dictation along with smaller phrase technology. Similar sounding words can be transcribed inadequately or may not be corrected upon review. Any transcriptional errors that result from this process are unintentional.      Disposition:  Follow up  Signed, 10/21/21, MD  11/30/2021 2:49 PM     Anabel Halon [facility]

## 2021-12-02 NOTE — Patient Instructions (Addendum)
Your procedure is scheduled on: 12/07/2021  Report to [facility] Main Entrance at   6:15  AM.  Call this number if you have problems the morning of surgery: [phone]   Remember:  Follow instructions from office regarding your bowel prep, Enema and pre operative antibiotics (see After visit summary from office)  Drink 2 carb loading drinks the night before surgery   Do not Eat or Drink after midnight except drink 1 carb loading drink at 3:30 am        No Smoking the morning of surgery  :  Take these medicines the morning of surgery with A SIP OF WATER: Buspar, Cymbalta, Gabapentin, hydroxyzine, Telmisartan, and tramadol if needed   Do not wear jewelry, make-up or nail polish.  Do not wear lotions, powders, or perfumes. You may wear deodorant.  Do not shave 48 hours prior to surgery. Men may shave face and neck.  Do not bring valuables to the hospital.  Contacts, dentures or bridgework may not be worn into surgery.  Leave suitcase in the car. After surgery it may be brought to your room.  For patients admitted to the hospital, checkout time is 11:00 AM the day of discharge.   Patients discharged the day of surgery will not be allowed to drive home.    Special Instructions: Shower using CHG night before surgery and shower the day of surgery use CHG.  Use special wash - you have one bottle of CHG for all showers.  You should use approximately 1/2 of the bottle for each shower.  How to Use Chlorhexidine for Bathing Chlorhexidine gluconate (CHG) is a germ-killing (antiseptic) solution that is used to clean the skin. It can get rid of the bacteria that normally live on the skin and can keep them away for about 24 hours. To clean your skin with CHG, you may be given: A CHG solution to use in the shower or as part of a sponge bath. A prepackaged cloth that contains CHG. Cleaning your skin with CHG may help lower the risk for infection: While you are staying in the intensive care unit of  the hospital. If you have a vascular access, such as a central line, to provide short-term or long-term access to your veins. If you have a catheter to drain urine from your bladder. If you are on a ventilator. A ventilator is a machine that helps you breathe by moving air in and out of your lungs. After surgery. What are the risks? Risks of using CHG include: A skin reaction. Hearing loss, if CHG gets in your ears and you have a perforated eardrum. Eye injury, if CHG gets in your eyes and is not rinsed out. The CHG product catching fire. Make sure that you avoid smoking and flames after applying CHG to your skin. Do not use CHG: If you have a chlorhexidine allergy or have previously reacted to chlorhexidine. On babies younger than 88 months of age. How to use CHG solution Use CHG only as told by your health care provider, and follow the instructions on the label. Use the full amount of CHG as directed. Usually, this is one bottle. During a shower Follow these steps when using CHG solution during a shower (unless your health care provider gives you different instructions): Start the shower. Use your normal soap and shampoo to wash your face and hair. Turn off the shower or move out of the shower stream. Pour the CHG onto a clean washcloth. Do not use  any type of brush or rough-edged sponge. Starting at your neck, lather your body down to your toes. Make sure you follow these instructions: If you will be having surgery, pay special attention to the part of your body where you will be having surgery. Scrub this area for at least 1 minute. Do not use CHG on your head or face. If the solution gets into your ears or eyes, rinse them well with water. Avoid your genital area. Avoid any areas of skin that have broken skin, cuts, or scrapes. Scrub your back and under your arms. Make sure to wash skin folds. Let the lather sit on your skin for 1-2 minutes or as long as told by your health care  provider. Thoroughly rinse your entire body in the shower. Make sure that all body creases and crevices are rinsed well. Dry off with a clean towel. Do not put any substances on your body afterward--such as powder, lotion, or perfume--unless you are told to do so by your health care provider. Only use lotions that are recommended by the manufacturer. Put on clean clothes or pajamas. If it is the night before your surgery, sleep in clean sheets.  During a sponge bath Follow these steps when using CHG solution during a sponge bath (unless your health care provider gives you different instructions): Use your normal soap and shampoo to wash your face and hair. Pour the CHG onto a clean washcloth. Starting at your neck, lather your body down to your toes. Make sure you follow these instructions: If you will be having surgery, pay special attention to the part of your body where you will be having surgery. Scrub this area for at least 1 minute. Do not use CHG on your head or face. If the solution gets into your ears or eyes, rinse them well with water. Avoid your genital area. Avoid any areas of skin that have broken skin, cuts, or scrapes. Scrub your back and under your arms. Make sure to wash skin folds. Let the lather sit on your skin for 1-2 minutes or as long as told by your health care provider. Using a different clean, wet washcloth, thoroughly rinse your entire body. Make sure that all body creases and crevices are rinsed well. Dry off with a clean towel. Do not put any substances on your body afterward--such as powder, lotion, or perfume--unless you are told to do so by your health care provider. Only use lotions that are recommended by the manufacturer. Put on clean clothes or pajamas. If it is the night before your surgery, sleep in clean sheets. How to use CHG prepackaged cloths Only use CHG cloths as told by your health care provider, and follow the instructions on the label. Use the  CHG cloth on clean, dry skin. Do not use the CHG cloth on your head or face unless your health care provider tells you to. When washing with the CHG cloth: Avoid your genital area. Avoid any areas of skin that have broken skin, cuts, or scrapes. Before surgery Follow these steps when using a CHG cloth to clean before surgery (unless your health care provider gives you different instructions): Using the CHG cloth, vigorously scrub the part of your body where you will be having surgery. Scrub using a back-and-forth motion for 3 minutes. The area on your body should be completely wet with CHG when you are done scrubbing. Do not rinse. Discard the cloth and let the area air-dry. Do not put any substances on  the area afterward, such as powder, lotion, or perfume. Put on clean clothes or pajamas. If it is the night before your surgery, sleep in clean sheets.  For general bathing Follow these steps when using CHG cloths for general bathing (unless your health care provider gives you different instructions). Use a separate CHG cloth for each area of your body. Make sure you wash between any folds of skin and between your fingers and toes. Wash your body in the following order, switching to a new cloth after each step: The front of your neck, shoulders, and chest. Both of your arms, under your arms, and your hands. Your stomach and groin area, avoiding the genitals. Your right leg and foot. Your left leg and foot. The back of your neck, your back, and your buttocks. Do not rinse. Discard the cloth and let the area air-dry. Do not put any substances on your body afterward--such as powder, lotion, or perfume--unless you are told to do so by your health care provider. Only use lotions that are recommended by the manufacturer. Put on clean clothes or pajamas. Contact a health care provider if: Your skin gets irritated after scrubbing. You have questions about using your solution or cloth. You swallow  any chlorhexidine. Call your local poison control center ([phone] in the [place] Get help right away if: Your eyes itch badly, or they become very red or swollen. Your skin itches badly and is red or swollen. Your hearing changes. You have trouble seeing. You have swelling or tingling in your mouth or throat. You have trouble breathing. These symptoms may represent a serious problem that is an emergency. Do not wait to see if the symptoms will go away. Get medical help right away. Call your local emergency services (911 in the [place] Do not drive yourself to the hospital. Summary Chlorhexidine gluconate (CHG) is a germ-killing (antiseptic) solution that is used to clean the skin. Cleaning your skin with CHG may help to lower your risk for infection. You may be given CHG to use for bathing. It may be in a bottle or in a prepackaged cloth to use on your skin. Carefully follow your health care provider's instructions and the instructions on the product label. Do not use CHG if you have a chlorhexidine allergy. Contact your health care provider if your skin gets irritated after scrubbing. This information is not intended to replace advice given to you by your health care provider. Make sure you discuss any questions you have with your health care provider. Document Revised: 02/23/2021 Document Reviewed: 02/23/2021 Elsevier Patient Education  2022 [employer] Colostomy Reversal Surgery, Care After This sheet gives you information about how to care for yourself after your procedure. Your health care provider may also give you more specific instructions. If you have problems or questions, contact your health care provider. What can I expect after the procedure? After the procedure, it is common to have: Pain and discomfort in your abdomen, especially near your incision. Loose stools (diarrhea). Decreased appetite. Constipation. Follow these instructions at home: Activity  Do not lift  anything that is heavier than 10 lb (4.5 kg), or the limit that you are told, until your health care provider says that it is safe. Return to your normal activities as told by your health care provider. Ask your health care provider what activities are safe for you. Avoid sitting for a long time without moving. Get up to take short walks every 1-2 hours. This is important to improve blood  flow and breathing. Ask for help if you feel weak or unsteady. Avoid strenuous activity, contact sports, and abdominal exercises for 4 weeks or as long as told by your health care provider. Incision care  Follow instructions from your health care provider about how to take care of your incision. Make sure you: Wash your hands with soap and water before and after you change your bandage (dressing). If soap and water are not available, use hand sanitizer. Change your dressing as told by your health care provider. Leave stitches (sutures), skin glue, or adhesive strips in place. These skin closures may need to stay in place for 2 weeks or longer. If adhesive strip edges start to loosen and curl up, you may trim the loose edges. Do not remove adhesive strips completely unless your health care provider tells you to do that. Keep the incision area clean and dry. Check your incision area every day for signs of infection. Check for: More redness, swelling, or pain. More fluid or blood. Warmth. Pus or a bad smell. Bathing Do not take baths, swim, or use a hot tub until your health care provider approves. Ask your health care provider if you may take showers. You may only be allowed to take sponge baths. If your health care provider approves bathing and showering, cover the dressing with a watertight covering to protect it from water. Do not let the dressing get wet. Keep the dressing dry until your health care provider says it can be removed. Driving Do not drive for 24 hours if you were given a sedative during your  procedure. Follow other driving restrictions as told by your health care provider. Do not drive or use heavy machinery while taking prescription pain medicine. Eating and drinking Follow instructions from your health care provider about eating or drinking restrictions. This may include: What to eat and drink. You may be told to start eating a bland diet. Over time, you may slowly resume a more normal, healthy diet. How much to eat and drink. You should eat small meals often and stop eating when you feel full. Take nutrition supplements as told by your health care provider or dietitian. General instructions Take over-the-counter and prescription medicines only as told by your health care provider. Take steps to treat diarrhea or constipation as told by your health care provider. Your health care provider may recommend that you: Drink enough fluid to keep your urine pale yellow. Avoid fluids that contain a lot of sugar or caffeine, such as energy drinks, sports drinks, and soda. Eat bland, easy-to-digest foods in small amounts as you are able. These foods include bananas, applesauce, rice, lean meats, toast, and crackers. Take over-the-counter or prescription medicines. Limit foods that are high in fat and processed sugars, such as fried or sweet foods. Do not use any products that contain nicotine or tobacco, such as cigarettes, e-cigarettes, and chewing tobacco. These can delay incision healing after surgery. If you need help quitting, ask your health care provider. Keep all follow-up visits as told by your health care provider. This is important. Contact a health care provider if: You have more redness, swelling, or pain at the site of your incision. You have more fluid or blood coming from your incision. Your incision feels warm to the touch. You have pus or a bad smell coming from your incision. You have a fever. Your incision breaks open. You feel nauseous. You are not able to have a  bowel movement (are constipated). Your diarrhea gets  worse. You have pain that is not controlled with medicine. Get help right away if you have: Abdominal pain that does not go away or becomes severe. Frequent vomiting and you are not able to eat or drink. Difficulty breathing. Summary After colostomy reversal surgery, it is common to have abdominal pain, decreased appetite, diarrhea, or constipation. Follow instructions from your health care provider about how to take care of your incision. Do not let the dressing get wet. Take over-the-counter and prescription medicines only as told by your health care provider. Contact your health care provider if you are not able to have a bowel movement (are constipated). Keep all follow-up visits as told by your health care provider. This is important. This information is not intended to replace advice given to you by your health care provider. Make sure you discuss any questions you have with your health care provider. Document Revised: 05/31/2018 Document Reviewed: 05/31/2018 Elsevier Patient Education  2022 [employer] General Anesthesia, Adult, Care After This sheet gives you information about how to care for yourself after your procedure. Your health care provider may also give you more specific instructions. If you have problems or questions, contact your health care provider. What can I expect after the procedure? After the procedure, the following side effects are common: Pain or discomfort at the IV site. Nausea. Vomiting. Sore throat. Trouble concentrating. Feeling cold or chills. Feeling weak or tired. Sleepiness and fatigue. Soreness and body aches. These side effects can affect parts of the body that were not involved in surgery. Follow these instructions at home: For the time period you were told by your health care provider:  Rest. Do not participate in activities where you could fall or become injured. Do not drive or use  machinery. Do not drink alcohol. Do not take sleeping pills or medicines that cause drowsiness. Do not make important decisions or sign legal documents. Do not take care of children on your own. Eating and drinking Follow any instructions from your health care provider about eating or drinking restrictions. When you feel hungry, start by eating small amounts of foods that are soft and easy to digest (bland), such as toast. Gradually return to your regular diet. Drink enough fluid to keep your urine pale yellow. If you vomit, rehydrate by drinking water, juice, or clear broth. General instructions If you have sleep apnea, surgery and certain medicines can increase your risk for breathing problems. Follow instructions from your health care provider about wearing your sleep device: Anytime you are sleeping, including during daytime naps. While taking prescription pain medicines, sleeping medicines, or medicines that make you drowsy. Have a responsible adult stay with you for the time you are told. It is important to have someone help care for you until you are awake and alert. Return to your normal activities as told by your health care provider. Ask your health care provider what activities are safe for you. Take over-the-counter and prescription medicines only as told by your health care provider. If you smoke, do not smoke without supervision. Keep all follow-up visits as told by your health care provider. This is important. Contact a health care provider if: You have nausea or vomiting that does not get better with medicine. You cannot eat or drink without vomiting. You have pain that does not get better with medicine. You are unable to pass urine. You develop a skin rash. You have a fever. You have redness around your IV site that gets worse. Get help right  away if: You have difficulty breathing. You have chest pain. You have blood in your urine or stool, or you vomit  blood. Summary After the procedure, it is common to have a sore throat or nausea. It is also common to feel tired. Have a responsible adult stay with you for the time you are told. It is important to have someone help care for you until you are awake and alert. When you feel hungry, start by eating small amounts of foods that are soft and easy to digest (bland), such as toast. Gradually return to your regular diet. Drink enough fluid to keep your urine pale yellow. Return to your normal activities as told by your health care provider. Ask your health care provider what activities are safe for you. This information is not intended to replace advice given to you by your health care provider. Make sure you discuss any questions you have with your health care provider. Document Revised: 08/28/2020 Document Reviewed: 03/27/2020 Elsevier Patient Education  2022 [employer].

## 2021-12-04 ENCOUNTER — Encounter (HOSPITAL_COMMUNITY): Payer: Self-pay | Admitting: Psychiatry

## 2021-12-04 ENCOUNTER — Other Ambulatory Visit (HOSPITAL_COMMUNITY)
Admission: RE | Admit: 2021-12-04 | Discharge: 2021-12-04 | Disposition: A | Discharge status: Home / Self Care | Payer: PPO | Source: Ambulatory Visit | Attending: General Surgery | Admitting: General Surgery

## 2021-12-04 ENCOUNTER — Other Ambulatory Visit: Payer: Self-pay

## 2021-12-04 ENCOUNTER — Telehealth (HOSPITAL_COMMUNITY): Payer: PPO | Admitting: Psychiatry

## 2021-12-04 ENCOUNTER — Ambulatory Visit (HOSPITAL_COMMUNITY)
Admission: RE | Admit: 2021-12-04 | Discharge: 2021-12-04 | Disposition: A | Discharge status: Home / Self Care | Payer: PPO | Source: Ambulatory Visit | Attending: General Surgery | Admitting: General Surgery

## 2021-12-04 ENCOUNTER — Encounter (HOSPITAL_COMMUNITY)
Admission: RE | Admit: 2021-12-04 | Discharge: 2021-12-04 | Disposition: A | Discharge status: Home / Self Care | Payer: PPO | Source: Ambulatory Visit | Attending: General Surgery | Admitting: General Surgery

## 2021-12-04 ENCOUNTER — Telehealth (INDEPENDENT_AMBULATORY_CARE_PROVIDER_SITE_OTHER): Payer: PPO | Admitting: Psychiatry

## 2021-12-04 DIAGNOSIS — F3112 Bipolar disorder, current episode manic without psychotic features, moderate: Secondary | ICD-10-CM

## 2021-12-04 DIAGNOSIS — D860 Sarcoidosis of lung: Secondary | ICD-10-CM | POA: Insufficient documentation

## 2021-12-04 DIAGNOSIS — Z01818 Encounter for other preprocedural examination: Secondary | ICD-10-CM | POA: Diagnosis not present

## 2021-12-04 DIAGNOSIS — Z01812 Encounter for preprocedural laboratory examination: Secondary | ICD-10-CM | POA: Insufficient documentation

## 2021-12-04 DIAGNOSIS — Z933 Colostomy status: Secondary | ICD-10-CM

## 2021-12-04 DIAGNOSIS — F5102 Adjustment insomnia: Secondary | ICD-10-CM | POA: Diagnosis not present

## 2021-12-04 DIAGNOSIS — F419 Anxiety disorder, unspecified: Secondary | ICD-10-CM

## 2021-12-04 DIAGNOSIS — E119 Type 2 diabetes mellitus without complications: Secondary | ICD-10-CM

## 2021-12-04 DIAGNOSIS — R059 Cough, unspecified: Secondary | ICD-10-CM | POA: Diagnosis not present

## 2021-12-04 DIAGNOSIS — R509 Fever, unspecified: Secondary | ICD-10-CM | POA: Diagnosis not present

## 2021-12-04 MED ORDER — HYDROXYZINE HCL 25 MG PO TABS: ORAL_TABLET | ORAL | 0 refills | Status: DC

## 2021-12-04 NOTE — Progress Notes (Signed)
[facility] Followup visit   Patient Identification: [name] [name] MRN:  [id number] Date of Evaluation:  12/04/2021 Referral Source: [facility] Discharge Chief Complaint:  Follow up bipolar  Visit Diagnosis:    ICD-10-CM   1. Bipolar disorder, curr episode manic w/o psychotic features, moderate ([place])  F31.12     2. Anxiety  F41.9 hydrOXYzine (ATARAX) 25 MG tablet    3. Adjustment insomnia  F51.02      Virtual Visit via Video Note  I connected with [name] on 12/04/21 at  1:00 PM EST by a video enabled telemedicine application and verified that I am speaking with the correct person using two identifiers.  Location: Patient: home Provider: home office   I discussed the limitations of evaluation and management by telemedicine and the availability of in person appointments. The patient expressed understanding and agreed to proceed.     I discussed the assessment and treatment plan with the patient. The patient was provided an opportunity to ask questions and all were answered. The patient agreed with the plan and demonstrated an understanding of the instructions.   The patient was advised to call back or seek an in-person evaluation if the symptoms worsen or if the condition fails to improve as anticipated.  I provided 15 -20 minutes of non-face-to-face time during this encounter including chart review, documentation.   History of Present Illness:  Patient initially referred from discharge during April admission Admitted with mania and psychosis, in past , non compliance and alcohol use   Doing fair, moved with wife, financial stressors and pedning hernia surgery Overall reamins sober and not agitated  Not twitching since off tegretol  Takes risperdal at night, also on gaba Hydroxyzine helps with anxiety  Primary care started him on cymbalta for pain and anxiety     Aggravating factors; drug use per history , non compliance , medical co morbidity Modifying factors; mother,  wife Duration: adult life   Past Psychiatric History: bipolar disorder  Previous Psychotropic Medications: Yes   Substance Abuse History in the last 12 months:  No.  Consequences of Substance Abuse: not gives clear history of using drugs or minimizes use  Past Medical History:  Past Medical History:  Diagnosis Date   Anxiety    Bipolar 1 disorder ([place])    Chronic fatigue    Chronic pain    Complete intestinal obstruction ([place])    Depression    Phreesia 12/03/2020   Fecal peritonitis ([place]) 08/03/2019   Fibromyalgia    Hyperlipidemia    Phreesia 12/03/2020   Hypertension    Ileus, postoperative ([place])    Perforated rectum ([place]) 08/03/2019   Sleep apnea     Past Surgical History:  Procedure Laterality Date   COLON RESECTION SIGMOID  08/03/2019   Procedure: COLON RESECTION SIGMOID;  Surgeon: Virl Cagey, MD;  Location: [facility];  Service: General;;   COLONOSCOPY WITH PROPOFOL N/A 10/21/2021   Procedure: COLONOSCOPY WITH PROPOFOL;  Surgeon: Rogene Houston, MD;  Location: [facility];  Service: Endoscopy;  Laterality: N/A;  955   COLOSTOMY N/A 08/03/2019   Procedure: COLOSTOMY;  Surgeon: Virl Cagey, MD;  Location: [facility];  Service: General;  Laterality: N/A;   FLEXIBLE SIGMOIDOSCOPY N/A 08/03/2019   Procedure: FLEXIBLE SIGMOIDOSCOPY;  Surgeon: Daneil Dolin, MD;  Location[facility];  Service: Endoscopy;  Laterality: N/A;   KNEE ARTHROSCOPY Left 2006   miniscus tear   LAPAROTOMY N/A 08/03/2019   Procedure: EXPLORATORY LAPAROTOMY;  Surgeon:  Virl Cagey, MD;  Location: [facility];  Service: General;  Laterality: N/A;   LYMPH GLAND EXCISION Left 1983   SMALL INTESTINE SURGERY N/A    Phreesia 12/03/2020   TONSILLECTOMY      Family Psychiatric History: denies  Family History:  Family History  Problem Relation Age of Onset   Diabetes Mother    Heart attack Father    Diabetes Maternal Grandmother    Colon cancer Neg Hx    Colon polyps Neg Hx      Social History:   Social History   Socioeconomic History   Marital status: Married    Spouse name: Not on file   Number of children: Not on file   Years of education: Not on file   Highest education level: Not on file  Occupational History   Not on file  Tobacco Use   Smoking status: Every Day    Packs/day: 1.00    Years: 10.00    Pack years: 10.00    Types: Cigarettes   Smokeless tobacco: Never  Vaping Use   Vaping Use: Some days  Substance and Sexual Activity   Alcohol use: Not Currently    Comment: occasional; denied 10/02/19   Drug use: Not Currently    Types: Cocaine    Comment: last used in December 2020.    Sexual activity: Not Currently  Other Topics Concern   Not on file  Social History Narrative   Pt lives alone and is on disability. Counts his mother, sister and aunt as his social supports.    Social Determinants of Health   Financial Resource Strain: Not on file  Food Insecurity: Not on file  Transportation Needs: Not on file  Physical Activity: Not on file  Stress: Not on file  Social Connections: Not on file     Allergies:   Allergies  Allergen Reactions   Codeine Shortness Of Breath and Swelling   Divalproex Sodium Diarrhea, Itching, Rash, Shortness Of Breath and Swelling   Erythromycin Hives, Itching, Rash and Swelling   Iodinated Diagnostic Agents Swelling    Patient states he received "red Dye" 15-20 years ago and become red/flushed. No other symptoms. Patient did do 13 hour pre meds for CT 09/17/2021   Latex Itching and Rash   Morphine And Related Shortness Of Breath and Swelling   Sulfa Antibiotics Diarrhea, Itching, Rash, Shortness Of Breath and Swelling   Wheat Bran Hives, Itching, Rash, Shortness Of Breath and Swelling   Demerol [Meperidine]     Body over heats and pouring sweat   Levofloxacin     Pt does not recall reaction    Niacin And Related Rash   Penicillins Swelling and Rash    REACTION: Rash and facial swelling at age 75     Metabolic Disorder Labs: Lab Results  Component Value Date   HGBA1C 5.7 (H) 06/01/2021   MPG 108.28 04/07/2021   No results found for: PROLACTIN Lab Results  Component Value Date   CHOL 180 06/01/2021   TRIG 235 (H) 06/01/2021   HDL 48 06/01/2021   CHOLHDL 3.8 06/01/2021   VLDL 32 04/09/2021   LDLCALC 92 06/01/2021   LDLCALC 135 (H) 04/09/2021   Lab Results  Component Value Date   TSH 1.470 06/01/2021    Therapeutic Level Labs: Lab Results  Component Value Date   LITHIUM <0.06 (L) 09/20/2019   Lab Results  Component Value Date   CBMZ 6.7 04/19/2021   No results found for: VALPROATE  Current Medications: Current Outpatient Medications  Medication Sig Dispense Refill   acetaminophen (TYLENOL) 500 MG tablet Take 500 mg by mouth every 6 (six) hours as needed for moderate pain.     azithromycin (ZITHROMAX) 250 MG tablet Take 2 tablets on day 1, then 1 tablet daily on days 2 through 5 6 tablet 0   busPIRone (BUSPAR) 7.5 MG tablet Take 1 tablet (7.5 mg total) by mouth daily. 30 tablet 0   Chlorphen-PE-Acetaminophen (NOREL AD) 4-10-325 MG TABS Take 1 tablet by mouth 3 (three) times daily as needed (Nasal congestion). 30 tablet 0   cyclobenzaprine (FLEXERIL) 10 MG tablet Take 10 mg by mouth 2 (two) times daily.     DULoxetine (CYMBALTA) 60 MG capsule Take 60 mg by mouth daily.     gabapentin (NEURONTIN) 300 MG capsule Take 1 capsule (300 mg total) by mouth 3 (three) times daily. 90 capsule 3   hydrOXYzine (ATARAX) 25 MG tablet BID IF NEEDED FOR ANXIETY 60 tablet 0   metroNIDAZOLE (FLAGYL) 500 MG tablet Take 2 tablets (1,000 mg total) by mouth as directed. Take 2 flagyl 500mg  tablets at 2 pm, 3pm, and 10 pm the day before surgery. 6 tablet 0   neomycin (MYCIFRADIN) 500 MG tablet Take 2 tablets (1,000 mg total) by mouth as directed. Take 2 neomycin 500mg  tablets at 2 pm, 3pm, and 10 pm the day before surgery. 6 tablet 0   nicotine (NICODERM CQ - DOSED IN MG/24 HOURS) 21  mg/24hr patch Place 21 mg onto the skin daily.     risperidone (RISPERDAL) 4 MG tablet TAKE (1) TABLET BY MOUTH AT BEDTIME. 30 tablet 1   telmisartan (MICARDIS) 20 MG tablet TAKE (1) TABLET BY MOUTH ONCE A DAY. 90 tablet 0   traMADol (ULTRAM) 50 MG tablet Take 50 mg by mouth 4 (four) times daily.     No current facility-administered medications for this visit.      Psychiatric Specialty Exam: Review of Systems  Cardiovascular:  Negative for chest pain.  Neurological:  Negative for tremors.  Psychiatric/Behavioral:  Negative for agitation, hallucinations and self-injury.    There were no vitals taken for this visit.There is no height or weight on file to calculate BMI.  General Appearance: casual  Eye Contact: fair  Speech:  more clear  Volume:  Normal  Mood:  Euthymic  Affectfair  Thought Process:  Coherent  Orientation:  Full (Time, Place, and Person)  Thought Content:  Rumination  Suicidal Thoughts:  No  Homicidal Thoughts:  No  Memory:  Immediate;   Fair Recent;   Fair  Judgement:  Other:  limited  Insight:  Shallow  Psychomotor Activity:  Normal  Concentration:  Concentration: Fair and Attention Span: Fair  Recall:  [employer] of Knowledge:Fair  Language: Fair  Akathisia:  No  Handed:    AIMS (if indicated):no involuntary movements noteiceable  Assets:  Desire for Improvement  ADL's:  Intact  Cognition: WNL  Sleep:  Fair   Screenings: AIMS    Flowsheet Row Admission (Discharged) from 04/07/2021 in [facility]  AIMS Total Score 0      AUDIT    Flowsheet Row Admission (Discharged) from 04/07/2021 in [facility]  Alcohol Use Disorder Identification Test Final Score (AUDIT) 0      GAD-7    Flowsheet Row Counselor from 05/05/2021 in [facility]-  Total GAD-7 Score 4      PHQ2-9  Flowsheet Row Office Visit from 11/30/2021 in [facility] Most recent reading at 11/30/2021  1:08 PM Office Visit from 09/28/2021 in [facility] Most recent reading at 09/28/2021  1:14 PM Office Visit from 07/17/2021 in [facility] Most recent reading at 07/17/2021  8:10 AM Office Visit from 05/27/2021 in [facility] Most recent reading at 05/27/2021  2:51 PM Video [facility]27[facility]2021 [facility] Most recent reading at 05/27/2021  1:52 PM  PHQ-2 Total Score 0 0 1 1 1   PHQ-9 Total Score -- -- -- -- 6      Flowsheet Row Pre-Admission Testing 60 from 12/04/2021 in [facility] Admission (Discharged) from 10/21/2021 in [facility] Pre-Admission Testing 60 from 10/19/2021 in [facility]  C-SSRS RISK CATEGORY No Risk No Risk No Risk       Assessment and Plan: as follows Prior documentation reviewed  Bipolar current episode manic with psychosis: fair, not manic, tolerating meds, continue risperdal 4mg  qhs  Now off tegretol as well Avoid klonopine  Adjustment insomnia  : fair, continue trazadone   Anxiety disorder unspecified ; worried about surgery, overall hydroxyzine and buspar helps, avoid benzo   Fu 2 m or earlier if needed   [place], MD 12/9/20221:08 PM

## 2021-12-07 DIAGNOSIS — Z933 Colostomy status: Secondary | ICD-10-CM

## 2021-12-07 DIAGNOSIS — K432 Incisional hernia without obstruction or gangrene: Secondary | ICD-10-CM | POA: Diagnosis present

## 2021-12-08 ENCOUNTER — Other Ambulatory Visit: Payer: Self-pay

## 2021-12-08 ENCOUNTER — Ambulatory Visit (INDEPENDENT_AMBULATORY_CARE_PROVIDER_SITE_OTHER): Payer: PPO | Admitting: Internal Medicine

## 2021-12-08 ENCOUNTER — Encounter: Payer: Self-pay | Admitting: Internal Medicine

## 2021-12-08 DIAGNOSIS — J209 Acute bronchitis, unspecified: Secondary | ICD-10-CM

## 2021-12-08 DIAGNOSIS — R059 Cough, unspecified: Secondary | ICD-10-CM | POA: Diagnosis not present

## 2021-12-08 DIAGNOSIS — K9400 Colostomy complication, unspecified: Secondary | ICD-10-CM | POA: Diagnosis not present

## 2021-12-08 MED ORDER — ALBUTEROL SULFATE HFA 108 (90 BASE) MCG/ACT IN AERS
2.0000 | INHALATION_SPRAY | Freq: Four times a day (QID) | RESPIRATORY_TRACT | 0 refills | Status: DC | PRN
Start: 2021-12-08 — End: 2022-01-08

## 2021-12-08 MED ORDER — BENZONATATE 100 MG PO CAPS: 100.0000 mg | ORAL_CAPSULE | Freq: Two times a day (BID) | ORAL | 0 refills | Status: DC | PRN

## 2021-12-08 MED ORDER — METHYLPREDNISOLONE 4 MG PO TBPK: ORAL_TABLET | ORAL | 0 refills | Status: DC

## 2021-12-08 NOTE — Patient Instructions (Signed)
Please take Prednisone as prescribed.  Please use Albuterol inhaler as needed for shortness of breath or wheezing.  Please try to cut down -> quit smoking.

## 2021-12-08 NOTE — Progress Notes (Signed)
Virtual Visit via Telephone Note   This visit type was conducted due to national recommendations for restrictions regarding the COVID-19 Pandemic (e.g. social distancing) in an effort to limit this patient's exposure and mitigate transmission in our community.  Due to his co-morbid illnesses, this patient is at least at moderate risk for complications without adequate follow up.  This format is felt to be most appropriate for this patient at this time.  The patient did not have access to video technology/had technical difficulties with video requiring transitioning to audio format only (telephone).  All issues noted in this document were discussed and addressed.  No physical exam could be performed with this format.  Evaluation Performed:  Follow-up visit  Date:  12/08/2021   ID:  [name] [name], DOB 12-Nov-1966, MRN [id number]  Patient Location: Home Provider Location: Office/Clinic  Participants: Patient Location of Patient: Home Location of Provider: Telehealth Consent was obtain for visit to be over via telehealth. I verified that I am speaking with the correct person using two identifiers.  PCP:  Anabel Halon, MD   Chief Complaint: Cough and nasal congestion  History of Present Illness:    [name] [name] is a 55 y.o. male who has a televisit for complaint of cough with clear sputum and nasal congestion for last 2 to 3 weeks.  He was given azithromycin for acute sinusitis, which has improved his nasal congestion.  He continues to have severe coughing and mild dyspnea.  Of note, he still smokes, but states that he is trying to cut back.  His surgery was postponed due to cough.  He denies any fever, chills, chest pain or palpitations currently.  The patient does not have symptoms concerning for COVID-19 infection (fever, chills, cough, or new shortness of breath).   Past Medical, Surgical, Social History, Allergies, and Medications have been Reviewed.  Past Medical History:   Diagnosis Date   Anxiety    Bipolar 1 disorder ([facility])    Chronic fatigue    Chronic pain    Complete intestinal obstruction ([facility])    Depression    Phreesia 12/03/2020   Fecal peritonitis ([facility]) 08/03/2019   Fibromyalgia    Hyperlipidemia    Phreesia 12/03/2020   Hypertension    Ileus, postoperative ([facility])    Perforated rectum ([facility]) 08/03/2019   Sleep apnea    Past Surgical History:  Procedure Laterality Date   COLON RESECTION SIGMOID  08/03/2019   Procedure: COLON RESECTION SIGMOID;  Surgeon: Lucretia Roers, MD;  Location: [facility];  Service: General;;   COLONOSCOPY WITH PROPOFOL N/A 10/21/2021   Procedure: COLONOSCOPY WITH PROPOFOL;  Surgeon: Malissa Hippo, MD;  [facility];  Service: Endoscopy;  Laterality: N/A;  955   COLOSTOMY N/A 08/03/2019   Procedure: COLOSTOMY;  Surgeon: Lucretia Roers, MD;  Location: [facility];  Service: General;  Laterality: N/A;   FLEXIBLE SIGMOIDOSCOPY N/A 08/03/2019   Procedure: FLEXIBLE SIGMOIDOSCOPY;  Surgeon: Corbin Ade, MD;  [facility];  Service: Endoscopy;  Laterality: N/A;   KNEE ARTHROSCOPY Left 2006   miniscus tear   LAPAROTOMY N/A 08/03/2019   Procedure: EXPLORATORY LAPAROTOMY;  Surgeon: Lucretia Roers, MD;  Location: [facility];  Service: General;  Laterality: N/A;   LYMPH GLAND EXCISION Left 1983   SMALL INTESTINE SURGERY N/A    Phreesia 12/03/2020   TONSILLECTOMY       Current Meds  Medication Sig   acetaminophen (TYLENOL) 500 MG tablet  Take 500 mg by mouth every 6 (six) hours as needed for moderate pain.   albuterol (VENTOLIN HFA) 108 (90 Base) MCG/ACT inhaler Inhale 2 puffs into the lungs every 6 (six) hours as needed for wheezing or shortness of breath.   benzonatate (TESSALON) 100 MG capsule Take 1 capsule (100 mg total) by mouth 2 (two) times daily as needed for cough.   busPIRone (BUSPAR) 7.5 MG tablet Take 1 tablet (7.5 mg total) by mouth daily.   Chlorphen-PE-Acetaminophen (NOREL AD) 4-10-325 MG  TABS Take 1 tablet by mouth 3 (three) times daily as needed (Nasal congestion).   cyclobenzaprine (FLEXERIL) 10 MG tablet Take 10 mg by mouth 2 (two) times daily.   DULoxetine (CYMBALTA) 60 MG capsule Take 60 mg by mouth daily.   gabapentin (NEURONTIN) 300 MG capsule Take 1 capsule (300 mg total) by mouth 3 (three) times daily.   hydrOXYzine (ATARAX) 25 MG tablet BID IF NEEDED FOR ANXIETY   methylPREDNISolone (MEDROL DOSEPAK) 4 MG TBPK tablet Take as package instructions.   metroNIDAZOLE (FLAGYL) 500 MG tablet Take 2 tablets (1,000 mg total) by mouth as directed. Take 2 flagyl 500mg  tablets at 2 pm, 3pm, and 10 pm the day before surgery.   neomycin (MYCIFRADIN) 500 MG tablet Take 2 tablets (1,000 mg total) by mouth as directed. Take 2 neomycin 500mg  tablets at 2 pm, 3pm, and 10 pm the day before surgery.   nicotine (NICODERM CQ - DOSED IN MG/24 HOURS) 21 mg/24hr patch Place 21 mg onto the skin daily.   risperidone (RISPERDAL) 4 MG tablet TAKE (1) TABLET BY MOUTH AT BEDTIME.   telmisartan (MICARDIS) 20 MG tablet TAKE (1) TABLET BY MOUTH ONCE A DAY.   traMADol (ULTRAM) 50 MG tablet Take 50 mg by mouth 4 (four) times daily.     Allergies:   Codeine, Divalproex sodium, Erythromycin, Iodinated diagnostic agents, Latex, Morphine and related, Sulfa antibiotics, Wheat bran, Demerol [meperidine], Levofloxacin, Niacin and related, and Penicillins   ROS:   Please see the history of present illness.     All other systems reviewed and are negative.   Labs/Other Tests and Data Reviewed:    Recent Labs: 06/01/2021: ALT 16; BUN 8; Hemoglobin 15.2; Platelets 210; Potassium 4.7; Sodium 137; TSH 1.470 09/17/2021: Creatinine, Ser 0.90   Recent Lipid Panel Lab Results  Component Value Date/Time   CHOL 180 06/01/2021 08:33 AM   TRIG 235 (H) 06/01/2021 08:33 AM   HDL 48 06/01/2021 08:33 AM   CHOLHDL 3.8 06/01/2021 08:33 AM   CHOLHDL 4.2 04/09/2021 06:38 AM   LDLCALC 92 06/01/2021 08:33 AM    Wt Readings  from Last 3 Encounters:  11/17/21 239 lb (108.4 kg)  10/19/21 230 lb (104.3 kg)  10/07/21 235 lb 6 oz (106.8 kg)     ASSESSMENT & PLAN:    Acute bronchitis Started Medrol Dosepak Tessalon as needed for cough Continue Mucinex DM for cough Albuterol as needed for dyspnea or wheezing Advised to cut down -> quit smoking soon.  Time:   Today, I have spent 13 minutes reviewing the chart, including problem list, medications, and with the patient with telehealth technology discussing the above problems.   Medication Adjustments/Labs and Tests Ordered: Current medicines are reviewed at length with the patient today.  Concerns regarding medicines are outlined above.   Tests Ordered: No orders of the defined types were placed in this encounter.   Medication Changes: Meds ordered this encounter  Medications   benzonatate (TESSALON) 100 MG capsule  Sig: Take 1 capsule (100 mg total) by mouth 2 (two) times daily as needed for cough.    Dispense:  30 capsule    Refill:  0   methylPREDNISolone (MEDROL DOSEPAK) 4 MG TBPK tablet    Sig: Take as package instructions.    Dispense:  1 each    Refill:  0   albuterol (VENTOLIN HFA) 108 (90 Base) MCG/ACT inhaler    Sig: Inhale 2 puffs into the lungs every 6 (six) hours as needed for wheezing or shortness of breath.    Dispense:  8 g    Refill:  0    Okay to substitute to generic/formulary Albuterol.     Note: This dictation was prepared with Dragon dictation along with smaller phrase technology. Similar sounding words can be transcribed inadequately or may not be corrected upon review. Any transcriptional errors that result from this process are unintentional.      Disposition:  Follow up  Signed, Anabel Halon, MD  12/08/2021 11:51 AM     [facility]

## 2021-12-22 NOTE — Patient Instructions (Signed)
[name] [name]  12/22/2021     @PREFPERIOPPHARMACY @   Your procedure is scheduled on  12/25/2021.   Report to 12/27/2021 at  [phone] A.M.   Call this number if you have problems the morning of surgery:  [phone]   Remember:  Do not eat after midnight.    Drink 2 carb drinks before bed 12/24/2021.   You may drink clear liquids until  0330 AM on 12/25/2021.  Clear liquids allowed are:                    Water, Juice (non-citric and without pulp - diabetics please choose diet or no sugar options), Carbonated beverages - (diabetics please choose diet or no sugar options), Clear Tea, Black Coffee only (no creamer, milk or cream including half and half), Plain Jell-O only (diabetics please choose diet or no sugar options), Gatorade (diabetics please choose diet or no sugar options), and Plain Popsicles only    At 0330 AM, drink 1 carb drink. You can have nothing else to drink after this.    Use your inhaler before you come and bring your rescue inhaler with you.    Take these medicines the morning of surgery with A SIP OF WATER       buspar, flexeril(if needed), gabapentin, hydroxyzine, tramadol(if needed).    Do not wear jewelry, make-up or nail polish.  Do not wear lotions, powders, or perfumes, or deodorant.  Do not shave 48 hours prior to surgery.  Men may shave face and neck.  Do not bring valuables to the hospital.  [facility] is not responsible for any belongings or valuables.  Contacts, dentures or bridgework may not be worn into surgery.  Leave your suitcase in the car.  After surgery it may be brought to your room.  For patients admitted to the hospital, discharge time will be determined by your treatment team.  Patients discharged the day of surgery will not be allowed to drive home.    Special instructions:   DO NOT smoke tobacco or vape for 24 hours before your procedure.  Please read over the following fact sheets that you were given. Pain Booklet,  Coughing and Deep Breathing, Blood Transfusion Information, Surgical Site Infection Prevention, Anesthesia Post-op Instructions, and Care and Recovery After Surgery      Colostomy Reversal Surgery, Care After This sheet gives you information about how to care for yourself after your procedure. Your health care provider may also give you more specific instructions. If you have problems or questions, contact your health care provider. What can I expect after the procedure? After the procedure, it is common to have: Pain and discomfort in your abdomen, especially near your incision. Loose stools (diarrhea). Decreased appetite. Constipation. Follow these instructions at home: Activity  Do not lift anything that is heavier than 10 lb (4.5 kg), or the limit that you are told, until your health care provider says that it is safe. Return to your normal activities as told by your health care provider. Ask your health care provider what activities are safe for you. Avoid sitting for a long time without moving. Get up to take short walks every 1-2 hours. This is important to improve blood flow and breathing. Ask for help if you feel weak or unsteady. Avoid strenuous activity, contact sports, and abdominal exercises for 4 weeks or as long as told by your health care provider. Incision care  Follow instructions  from your health care provider about how to take care of your incision. Make sure you: Wash your hands with soap and water before and after you change your bandage (dressing). If soap and water are not available, use hand sanitizer. Change your dressing as told by your health care provider. Leave stitches (sutures), skin glue, or adhesive strips in place. These skin closures may need to stay in place for 2 weeks or longer. If adhesive strip edges start to loosen and curl up, you may trim the loose edges. Do not remove adhesive strips completely unless your health care provider tells you to do  that. Keep the incision area clean and dry. Check your incision area every day for signs of infection. Check for: More redness, swelling, or pain. More fluid or blood. Warmth. Pus or a bad smell. Bathing Do not take baths, swim, or use a hot tub until your health care provider approves. Ask your health care provider if you may take showers. You may only be allowed to take sponge baths. If your health care provider approves bathing and showering, cover the dressing with a watertight covering to protect it from water. Do not let the dressing get wet. Keep the dressing dry until your health care provider says it can be removed. Driving Do not drive for 24 hours if you were given a sedative during your procedure. Follow other driving restrictions as told by your health care provider. Do not drive or use heavy machinery while taking prescription pain medicine. Eating and drinking Follow instructions from your health care provider about eating or drinking restrictions. This may include: What to eat and drink. You may be told to start eating a bland diet. Over time, you may slowly resume a more normal, healthy diet. How much to eat and drink. You should eat small meals often and stop eating when you feel full. Take nutrition supplements as told by your health care provider or dietitian. General instructions Take over-the-counter and prescription medicines only as told by your health care provider. Take steps to treat diarrhea or constipation as told by your health care provider. Your health care provider may recommend that you: Drink enough fluid to keep your urine pale yellow. Avoid fluids that contain a lot of sugar or caffeine, such as energy drinks, sports drinks, and soda. Eat bland, easy-to-digest foods in small amounts as you are able. These foods include bananas, applesauce, rice, lean meats, toast, and crackers. Take over-the-counter or prescription medicines. Limit foods that are high in  fat and processed sugars, such as fried or sweet foods. Do not use any products that contain nicotine or tobacco, such as cigarettes, e-cigarettes, and chewing tobacco. These can delay incision healing after surgery. If you need help quitting, ask your health care provider. Keep all follow-up visits as told by your health care provider. This is important. Contact a health care provider if: You have more redness, swelling, or pain at the site of your incision. You have more fluid or blood coming from your incision. Your incision feels warm to the touch. You have pus or a bad smell coming from your incision. You have a fever. Your incision breaks open. You feel nauseous. You are not able to have a bowel movement (are constipated). Your diarrhea gets worse. You have pain that is not controlled with medicine. Get help right away if you have: Abdominal pain that does not go away or becomes severe. Frequent vomiting and you are not able to eat or drink.  Difficulty breathing. Summary After colostomy reversal surgery, it is common to have abdominal pain, decreased appetite, diarrhea, or constipation. Follow instructions from your health care provider about how to take care of your incision. Do not let the dressing get wet. Take over-the-counter and prescription medicines only as told by your health care provider. Contact your health care provider if you are not able to have a bowel movement (are constipated). Keep all follow-up visits as told by your health care provider. This is important. This information is not intended to replace advice given to you by your health care provider. Make sure you discuss any questions you have with your health care provider. Document Revised: 05/31/2018 Document Reviewed: 05/31/2018 Elsevier Patient Education  2022 [employer] General Anesthesia, Adult, Care After This sheet gives you information about how to care for yourself after your procedure. Your health  care provider may also give you more specific instructions. If you have problems or questions, contact your health care provider. What can I expect after the procedure? After the procedure, the following side effects are common: Pain or discomfort at the IV site. Nausea. Vomiting. Sore throat. Trouble concentrating. Feeling cold or chills. Feeling weak or tired. Sleepiness and fatigue. Soreness and body aches. These side effects can affect parts of the body that were not involved in surgery. Follow these instructions at home: For the time period you were told by your health care provider:  Rest. Do not participate in activities where you could fall or become injured. Do not drive or use machinery. Do not drink alcohol. Do not take sleeping pills or medicines that cause drowsiness. Do not make important decisions or sign legal documents. Do not take care of children on your own. Eating and drinking Follow any instructions from your health care provider about eating or drinking restrictions. When you feel hungry, start by eating small amounts of foods that are soft and easy to digest (bland), such as toast. Gradually return to your regular diet. Drink enough fluid to keep your urine pale yellow. If you vomit, rehydrate by drinking water, juice, or clear broth. General instructions If you have sleep apnea, surgery and certain medicines can increase your risk for breathing problems. Follow instructions from your health care provider about wearing your sleep device: Anytime you are sleeping, including during daytime naps. While taking prescription pain medicines, sleeping medicines, or medicines that make you drowsy. Have a responsible adult stay with you for the time you are told. It is important to have someone help care for you until you are awake and alert. Return to your normal activities as told by your health care provider. Ask your health care provider what activities are safe for  you. Take over-the-counter and prescription medicines only as told by your health care provider. If you smoke, do not smoke without supervision. Keep all follow-up visits as told by your health care provider. This is important. Contact a health care provider if: You have nausea or vomiting that does not get better with medicine. You cannot eat or drink without vomiting. You have pain that does not get better with medicine. You are unable to pass urine. You develop a skin rash. You have a fever. You have redness around your IV site that gets worse. Get help right away if: You have difficulty breathing. You have chest pain. You have blood in your urine or stool, or you vomit blood. Summary After the procedure, it is common to have a sore throat or nausea. It  is also common to feel tired. Have a responsible adult stay with you for the time you are told. It is important to have someone help care for you until you are awake and alert. When you feel hungry, start by eating small amounts of foods that are soft and easy to digest (bland), such as toast. Gradually return to your regular diet. Drink enough fluid to keep your urine pale yellow. Return to your normal activities as told by your health care provider. Ask your health care provider what activities are safe for you. This information is not intended to replace advice given to you by your health care provider. Make sure you discuss any questions you have with your health care provider. Document Revised: 08/28/2020 Document Reviewed: 03/27/2020 Elsevier Patient Education  2022 Elsevier Inc. How to Use Chlorhexidine for Bathing Chlorhexidine gluconate (CHG) is a germ-killing (antiseptic) solution that is used to clean the skin. It can get rid of the bacteria that normally live on the skin and can keep them away for about 24 hours. To clean your skin with CHG, you may be given: A CHG solution to use in the shower or as part of a sponge  bath. A prepackaged cloth that contains CHG. Cleaning your skin with CHG may help lower the risk for infection: While you are staying in the intensive care unit of the hospital. If you have a vascular access, such as a central line, to provide short-term or long-term access to your veins. If you have a catheter to drain urine from your bladder. If you are on a ventilator. A ventilator is a machine that helps you breathe by moving air in and out of your lungs. After surgery. What are the risks? Risks of using CHG include: A skin reaction. Hearing loss, if CHG gets in your ears and you have a perforated eardrum. Eye injury, if CHG gets in your eyes and is not rinsed out. The CHG product catching fire. Make sure that you avoid smoking and flames after applying CHG to your skin. Do not use CHG: If you have a chlorhexidine allergy or have previously reacted to chlorhexidine. On babies younger than 26 months of age. How to use CHG solution Use CHG only as told by your health care provider, and follow the instructions on the label. Use the full amount of CHG as directed. Usually, this is one bottle. During a shower Follow these steps when using CHG solution during a shower (unless your health care provider gives you different instructions): Start the shower. Use your normal soap and shampoo to wash your face and hair. Turn off the shower or move out of the shower stream. Pour the CHG onto a clean washcloth. Do not use any type of brush or rough-edged sponge. Starting at your neck, lather your body down to your toes. Make sure you follow these instructions: If you will be having surgery, pay special attention to the part of your body where you will be having surgery. Scrub this area for at least 1 minute. Do not use CHG on your head or face. If the solution gets into your ears or eyes, rinse them well with water. Avoid your genital area. Avoid any areas of skin that have broken skin, cuts, or  scrapes. Scrub your back and under your arms. Make sure to wash skin folds. Let the lather sit on your skin for 1-2 minutes or as long as told by your health care provider. Thoroughly rinse your entire body in  the shower. Make sure that all body creases and crevices are rinsed well. Dry off with a clean towel. Do not put any substances on your body afterward--such as powder, lotion, or perfume--unless you are told to do so by your health care provider. Only use lotions that are recommended by the manufacturer. Put on clean clothes or pajamas. If it is the night before your surgery, sleep in clean sheets.  During a sponge bath Follow these steps when using CHG solution during a sponge bath (unless your health care provider gives you different instructions): Use your normal soap and shampoo to wash your face and hair. Pour the CHG onto a clean washcloth. Starting at your neck, lather your body down to your toes. Make sure you follow these instructions: If you will be having surgery, pay special attention to the part of your body where you will be having surgery. Scrub this area for at least 1 minute. Do not use CHG on your head or face. If the solution gets into your ears or eyes, rinse them well with water. Avoid your genital area. Avoid any areas of skin that have broken skin, cuts, or scrapes. Scrub your back and under your arms. Make sure to wash skin folds. Let the lather sit on your skin for 1-2 minutes or as long as told by your health care provider. Using a different clean, wet washcloth, thoroughly rinse your entire body. Make sure that all body creases and crevices are rinsed well. Dry off with a clean towel. Do not put any substances on your body afterward--such as powder, lotion, or perfume--unless you are told to do so by your health care provider. Only use lotions that are recommended by the manufacturer. Put on clean clothes or pajamas. If it is the night before your surgery, sleep  in clean sheets. How to use CHG prepackaged cloths Only use CHG cloths as told by your health care provider, and follow the instructions on the label. Use the CHG cloth on clean, dry skin. Do not use the CHG cloth on your head or face unless your health care provider tells you to. When washing with the CHG cloth: Avoid your genital area. Avoid any areas of skin that have broken skin, cuts, or scrapes. Before surgery Follow these steps when using a CHG cloth to clean before surgery (unless your health care provider gives you different instructions): Using the CHG cloth, vigorously scrub the part of your body where you will be having surgery. Scrub using a back-and-forth motion for 3 minutes. The area on your body should be completely wet with CHG when you are done scrubbing. Do not rinse. Discard the cloth and let the area air-dry. Do not put any substances on the area afterward, such as powder, lotion, or perfume. Put on clean clothes or pajamas. If it is the night before your surgery, sleep in clean sheets.  For general bathing Follow these steps when using CHG cloths for general bathing (unless your health care provider gives you different instructions). Use a separate CHG cloth for each area of your body. Make sure you wash between any folds of skin and between your fingers and toes. Wash your body in the following order, switching to a new cloth after each step: The front of your neck, shoulders, and chest. Both of your arms, under your arms, and your hands. Your stomach and groin area, avoiding the genitals. Your right leg and foot. Your left leg and foot. The back of your neck,  your back, and your buttocks. Do not rinse. Discard the cloth and let the area air-dry. Do not put any substances on your body afterward--such as powder, lotion, or perfume--unless you are told to do so by your health care provider. Only use lotions that are recommended by the manufacturer. Put on clean clothes  or pajamas. Contact a health care provider if: Your skin gets irritated after scrubbing. You have questions about using your solution or cloth. You swallow any chlorhexidine. Call your local poison control center (810-564-6596 in the [place] Get help right away if: Your eyes itch badly, or they become very red or swollen. Your skin itches badly and is red or swollen. Your hearing changes. You have trouble seeing. You have swelling or tingling in your mouth or throat. You have trouble breathing. These symptoms may represent a serious problem that is an emergency. Do not wait to see if the symptoms will go away. Get medical help right away. Call your local emergency services (911 in the [place] Do not drive yourself to the hospital. Summary Chlorhexidine gluconate (CHG) is a germ-killing (antiseptic) solution that is used to clean the skin. Cleaning your skin with CHG may help to lower your risk for infection. You may be given CHG to use for bathing. It may be in a bottle or in a prepackaged cloth to use on your skin. Carefully follow your health care provider's instructions and the instructions on the product label. Do not use CHG if you have a chlorhexidine allergy. Contact your health care provider if your skin gets irritated after scrubbing. This information is not intended to replace advice given to you by your health care provider. Make sure you discuss any questions you have with your health care provider. Document Revised: 02/23/2021 Document Reviewed: 02/23/2021 Elsevier Patient Education  2022 [employer].

## 2021-12-23 ENCOUNTER — Encounter (HOSPITAL_COMMUNITY): Payer: Self-pay

## 2021-12-23 ENCOUNTER — Encounter (HOSPITAL_COMMUNITY)
Admission: RE | Admit: 2021-12-23 | Discharge: 2021-12-23 | Disposition: A | Discharge status: Home / Self Care | Payer: PPO | Source: Ambulatory Visit | Attending: General Surgery | Admitting: General Surgery

## 2021-12-23 ENCOUNTER — Other Ambulatory Visit (HOSPITAL_COMMUNITY)
Admission: RE | Admit: 2021-12-23 | Discharge: 2021-12-23 | Disposition: A | Discharge status: Home / Self Care | Payer: PPO | Source: Ambulatory Visit | Attending: General Surgery | Admitting: General Surgery

## 2021-12-23 VITALS — HR 114 | Temp 97.8°F | Resp 18

## 2021-12-23 DIAGNOSIS — Z881 Allergy status to other antibiotic agents status: Secondary | ICD-10-CM | POA: Diagnosis not present

## 2021-12-23 DIAGNOSIS — G8929 Other chronic pain: Secondary | ICD-10-CM | POA: Diagnosis present

## 2021-12-23 DIAGNOSIS — K435 Parastomal hernia without obstruction or  gangrene: Secondary | ICD-10-CM | POA: Diagnosis not present

## 2021-12-23 DIAGNOSIS — Z0000 Encounter for general adult medical examination without abnormal findings: Secondary | ICD-10-CM | POA: Diagnosis not present

## 2021-12-23 DIAGNOSIS — Z91018 Allergy to other foods: Secondary | ICD-10-CM | POA: Diagnosis not present

## 2021-12-23 DIAGNOSIS — Z91041 Radiographic dye allergy status: Secondary | ICD-10-CM | POA: Diagnosis not present

## 2021-12-23 DIAGNOSIS — Z0100 Encounter for examination of eyes and vision without abnormal findings: Secondary | ICD-10-CM | POA: Diagnosis not present

## 2021-12-23 DIAGNOSIS — K660 Peritoneal adhesions (postprocedural) (postinfection): Secondary | ICD-10-CM | POA: Diagnosis not present

## 2021-12-23 DIAGNOSIS — Z880 Allergy status to penicillin: Secondary | ICD-10-CM | POA: Diagnosis not present

## 2021-12-23 DIAGNOSIS — K439 Ventral hernia without obstruction or gangrene: Secondary | ICD-10-CM | POA: Diagnosis not present

## 2021-12-23 DIAGNOSIS — E119 Type 2 diabetes mellitus without complications: Secondary | ICD-10-CM | POA: Insufficient documentation

## 2021-12-23 DIAGNOSIS — Z885 Allergy status to narcotic agent status: Secondary | ICD-10-CM | POA: Diagnosis not present

## 2021-12-23 DIAGNOSIS — F17210 Nicotine dependence, cigarettes, uncomplicated: Secondary | ICD-10-CM | POA: Diagnosis not present

## 2021-12-23 DIAGNOSIS — G4730 Sleep apnea, unspecified: Secondary | ICD-10-CM | POA: Diagnosis present

## 2021-12-23 DIAGNOSIS — Z789 Other specified health status: Secondary | ICD-10-CM | POA: Diagnosis not present

## 2021-12-23 DIAGNOSIS — Z419 Encounter for procedure for purposes other than remedying health state, unspecified: Secondary | ICD-10-CM | POA: Insufficient documentation

## 2021-12-23 DIAGNOSIS — I10 Essential (primary) hypertension: Secondary | ICD-10-CM | POA: Diagnosis not present

## 2021-12-23 DIAGNOSIS — Z01812 Encounter for preprocedural laboratory examination: Secondary | ICD-10-CM | POA: Insufficient documentation

## 2021-12-23 DIAGNOSIS — Z833 Family history of diabetes mellitus: Secondary | ICD-10-CM | POA: Diagnosis not present

## 2021-12-23 DIAGNOSIS — E785 Hyperlipidemia, unspecified: Secondary | ICD-10-CM | POA: Diagnosis not present

## 2021-12-23 DIAGNOSIS — K432 Incisional hernia without obstruction or gangrene: Secondary | ICD-10-CM | POA: Diagnosis not present

## 2021-12-23 DIAGNOSIS — Z933 Colostomy status: Secondary | ICD-10-CM | POA: Insufficient documentation

## 2021-12-23 DIAGNOSIS — Z882 Allergy status to sulfonamides status: Secondary | ICD-10-CM | POA: Diagnosis not present

## 2021-12-23 DIAGNOSIS — Z433 Encounter for attention to colostomy: Secondary | ICD-10-CM | POA: Diagnosis not present

## 2021-12-23 DIAGNOSIS — R5382 Chronic fatigue, unspecified: Secondary | ICD-10-CM | POA: Diagnosis not present

## 2021-12-23 DIAGNOSIS — Z20822 Contact with and (suspected) exposure to covid-19: Secondary | ICD-10-CM | POA: Diagnosis not present

## 2021-12-23 DIAGNOSIS — M797 Fibromyalgia: Secondary | ICD-10-CM | POA: Diagnosis not present

## 2021-12-23 DIAGNOSIS — Z91040 Latex allergy status: Secondary | ICD-10-CM | POA: Diagnosis not present

## 2021-12-23 DIAGNOSIS — K9400 Colostomy complication, unspecified: Secondary | ICD-10-CM | POA: Diagnosis not present

## 2021-12-23 DIAGNOSIS — Z8249 Family history of ischemic heart disease and other diseases of the circulatory system: Secondary | ICD-10-CM | POA: Diagnosis not present

## 2021-12-23 DIAGNOSIS — N179 Acute kidney failure, unspecified: Secondary | ICD-10-CM | POA: Diagnosis not present

## 2021-12-23 DIAGNOSIS — F319 Bipolar disorder, unspecified: Secondary | ICD-10-CM | POA: Diagnosis not present

## 2021-12-23 DIAGNOSIS — Z888 Allergy status to other drugs, medicaments and biological substances status: Secondary | ICD-10-CM | POA: Diagnosis not present

## 2021-12-23 HISTORY — DX: Ventral hernia without obstruction or gangrene: K43.9

## 2021-12-23 LAB — CBC WITH DIFFERENTIAL/PLATELET
Abs Immature Granulocytes: 0.03 10*3/uL (ref 0.00–0.07)
Basophils Absolute: 0 10*3/uL (ref 0.0–0.1)
Basophils Relative: 0 %
Eosinophils Absolute: 0.3 10*3/uL (ref 0.0–0.5)
Eosinophils Relative: 3 %
HCT: 44.4 % (ref 39.0–52.0)
Hemoglobin: 15.6 g/dL (ref 13.0–17.0)
Immature Granulocytes: 0 %
Lymphocytes Relative: 21 %
Lymphs Abs: 2.3 10*3/uL (ref 0.7–4.0)
MCH: 30.6 pg (ref 26.0–34.0)
MCHC: 35.1 g/dL (ref 30.0–36.0)
MCV: 87.2 fL (ref 80.0–100.0)
Monocytes Absolute: 0.5 10*3/uL (ref 0.1–1.0)
Monocytes Relative: 5 %
Neutro Abs: 7.8 10*3/uL — ABNORMAL HIGH (ref 1.7–7.7)
Neutrophils Relative %: 71 %
Platelets: 256 10*3/uL (ref 150–400)
RBC: 5.09 MIL/uL (ref 4.22–5.81)
RDW: 12.6 % (ref 11.5–15.5)
WBC: 11.1 10*3/uL — ABNORMAL HIGH (ref 4.0–10.5)
nRBC: 0 % (ref 0.0–0.2)

## 2021-12-23 LAB — TYPE AND SCREEN
ABO/RH(D): A POS
Antibody Screen: NEGATIVE

## 2021-12-23 LAB — SARS CORONAVIRUS 2 (TAT 6-24 HRS): SARS Coronavirus 2: NEGATIVE

## 2021-12-23 LAB — BASIC METABOLIC PANEL
Anion gap: 8 (ref 5–15)
BUN: 6 mg/dL (ref 6–20)
CO2: 23 mmol/L (ref 22–32)
Calcium: 8.4 mg/dL — ABNORMAL LOW (ref 8.9–10.3)
Chloride: 102 mmol/L (ref 98–111)
Creatinine, Ser: 0.82 mg/dL (ref 0.61–1.24)
GFR, Estimated: >60 mL/min (ref >60)
Glucose, Bld: 136 mg/dL — ABNORMAL HIGH (ref 70–99)
Potassium: 3.8 mmol/L (ref 3.5–5.1)
Sodium: 133 mmol/L — ABNORMAL LOW (ref 135–145)

## 2021-12-23 LAB — HEMOGLOBIN A1C
Hgb A1c MFr Bld: 5.6 % (ref 4.8–5.6)
Mean Plasma Glucose: 114.02 mg/dL

## 2021-12-24 MED ORDER — CLINDAMYCIN PHOSPHATE 900 MG/50ML IV SOLN
900.0000 mg | INTRAVENOUS | Status: AC
  Administered 2021-12-25: 08:00:00 900 mg via INTRAVENOUS
  Filled 2021-12-24: qty 50

## 2021-12-24 MED ORDER — DEXTROSE 5 % IV SOLN
5.0000 mg/kg | INTRAVENOUS | Status: AC
  Administered 2021-12-25: 08:00:00 440 mg via INTRAVENOUS
  Filled 2021-12-24: qty 11

## 2021-12-25 ENCOUNTER — Inpatient Hospital Stay (HOSPITAL_COMMUNITY)
Admission: RE | Admit: 2021-12-25 | Discharge: 2021-12-30 | DRG: 330 | Disposition: A | Discharge status: Home / Self Care | Payer: PPO | Attending: General Surgery | Admitting: General Surgery

## 2021-12-25 ENCOUNTER — Encounter (HOSPITAL_COMMUNITY): Payer: Self-pay | Admitting: General Surgery

## 2021-12-25 ENCOUNTER — Encounter (HOSPITAL_COMMUNITY)
Admission: RE | Disposition: A | Discharge status: Home / Self Care | Payer: Self-pay | Source: Home / Self Care | Attending: General Surgery

## 2021-12-25 ENCOUNTER — Inpatient Hospital Stay (HOSPITAL_COMMUNITY): Payer: PPO | Admitting: Anesthesiology

## 2021-12-25 DIAGNOSIS — K432 Incisional hernia without obstruction or gangrene: Secondary | ICD-10-CM | POA: Diagnosis not present

## 2021-12-25 DIAGNOSIS — G8929 Other chronic pain: Secondary | ICD-10-CM | POA: Diagnosis present

## 2021-12-25 DIAGNOSIS — R5382 Chronic fatigue, unspecified: Secondary | ICD-10-CM | POA: Diagnosis present

## 2021-12-25 DIAGNOSIS — Z20822 Contact with and (suspected) exposure to covid-19: Secondary | ICD-10-CM | POA: Diagnosis present

## 2021-12-25 DIAGNOSIS — Z0100 Encounter for examination of eyes and vision without abnormal findings: Secondary | ICD-10-CM | POA: Diagnosis not present

## 2021-12-25 DIAGNOSIS — K660 Peritoneal adhesions (postprocedural) (postinfection): Secondary | ICD-10-CM | POA: Diagnosis not present

## 2021-12-25 DIAGNOSIS — Z8249 Family history of ischemic heart disease and other diseases of the circulatory system: Secondary | ICD-10-CM | POA: Diagnosis not present

## 2021-12-25 DIAGNOSIS — K9400 Colostomy complication, unspecified: Secondary | ICD-10-CM | POA: Diagnosis not present

## 2021-12-25 DIAGNOSIS — I10 Essential (primary) hypertension: Secondary | ICD-10-CM | POA: Diagnosis present

## 2021-12-25 DIAGNOSIS — Z888 Allergy status to other drugs, medicaments and biological substances status: Secondary | ICD-10-CM | POA: Diagnosis not present

## 2021-12-25 DIAGNOSIS — G4730 Sleep apnea, unspecified: Secondary | ICD-10-CM | POA: Diagnosis present

## 2021-12-25 DIAGNOSIS — Z789 Other specified health status: Secondary | ICD-10-CM | POA: Diagnosis not present

## 2021-12-25 DIAGNOSIS — Z91041 Radiographic dye allergy status: Secondary | ICD-10-CM

## 2021-12-25 DIAGNOSIS — Z433 Encounter for attention to colostomy: Secondary | ICD-10-CM | POA: Diagnosis not present

## 2021-12-25 DIAGNOSIS — Z833 Family history of diabetes mellitus: Secondary | ICD-10-CM | POA: Diagnosis not present

## 2021-12-25 DIAGNOSIS — F17210 Nicotine dependence, cigarettes, uncomplicated: Secondary | ICD-10-CM | POA: Diagnosis present

## 2021-12-25 DIAGNOSIS — Z881 Allergy status to other antibiotic agents status: Secondary | ICD-10-CM | POA: Diagnosis not present

## 2021-12-25 DIAGNOSIS — Z880 Allergy status to penicillin: Secondary | ICD-10-CM | POA: Diagnosis not present

## 2021-12-25 DIAGNOSIS — K435 Parastomal hernia without obstruction or  gangrene: Secondary | ICD-10-CM | POA: Diagnosis not present

## 2021-12-25 DIAGNOSIS — Z882 Allergy status to sulfonamides status: Secondary | ICD-10-CM

## 2021-12-25 DIAGNOSIS — N179 Acute kidney failure, unspecified: Secondary | ICD-10-CM | POA: Diagnosis not present

## 2021-12-25 DIAGNOSIS — F319 Bipolar disorder, unspecified: Secondary | ICD-10-CM | POA: Diagnosis present

## 2021-12-25 DIAGNOSIS — Z933 Colostomy status: Secondary | ICD-10-CM

## 2021-12-25 DIAGNOSIS — E785 Hyperlipidemia, unspecified: Secondary | ICD-10-CM | POA: Diagnosis present

## 2021-12-25 DIAGNOSIS — K439 Ventral hernia without obstruction or gangrene: Secondary | ICD-10-CM | POA: Diagnosis not present

## 2021-12-25 DIAGNOSIS — Z885 Allergy status to narcotic agent status: Secondary | ICD-10-CM

## 2021-12-25 DIAGNOSIS — Z0000 Encounter for general adult medical examination without abnormal findings: Secondary | ICD-10-CM | POA: Diagnosis not present

## 2021-12-25 DIAGNOSIS — Z91018 Allergy to other foods: Secondary | ICD-10-CM | POA: Diagnosis not present

## 2021-12-25 DIAGNOSIS — Z91040 Latex allergy status: Secondary | ICD-10-CM

## 2021-12-25 DIAGNOSIS — M797 Fibromyalgia: Secondary | ICD-10-CM | POA: Diagnosis present

## 2021-12-25 DIAGNOSIS — Z9889 Other specified postprocedural states: Secondary | ICD-10-CM | POA: Diagnosis present

## 2021-12-25 HISTORY — PX: PARASTOMAL HERNIA REPAIR: SHX2162

## 2021-12-25 HISTORY — PX: INCISIONAL HERNIA REPAIR: SHX193

## 2021-12-25 HISTORY — PX: COLOSTOMY REVERSAL: SHX5782

## 2021-12-25 LAB — GLUCOSE, CAPILLARY: Glucose-Capillary: 131 mg/dL — ABNORMAL HIGH (ref 70–99)

## 2021-12-25 SURGERY — COLOSTOMY REVERSAL
Anesthesia: General | Site: Abdomen

## 2021-12-25 MED ORDER — HEPARIN SODIUM (PORCINE) 5000 UNIT/ML IJ SOLN
5000.0000 [IU] | Freq: Once | INTRAMUSCULAR | Status: AC
  Administered 2021-12-25: 07:00:00 5000 [IU] via SUBCUTANEOUS
  Filled 2021-12-25: qty 1

## 2021-12-25 MED ORDER — HYDROMORPHONE HCL 1 MG/ML IJ SOLN
INTRAMUSCULAR | Status: AC
  Filled 2021-12-25: qty 0.5

## 2021-12-25 MED ORDER — DIPHENHYDRAMINE HCL 50 MG/ML IJ SOLN: 12.5000 mg | Freq: Four times a day (QID) | INTRAMUSCULAR | Status: DC | PRN

## 2021-12-25 MED ORDER — SIMETHICONE 80 MG PO CHEW
40.0000 mg | CHEWABLE_TABLET | Freq: Four times a day (QID) | ORAL | Status: DC | PRN
  Administered 2021-12-27: 40 mg via ORAL
  Filled 2021-12-25: qty 1

## 2021-12-25 MED ORDER — HYDROMORPHONE HCL 1 MG/ML IJ SOLN
0.2500 mg | INTRAMUSCULAR | Status: DC | PRN
  Administered 2021-12-25: 14:00:00 0.5 mg via INTRAVENOUS

## 2021-12-25 MED ORDER — FENTANYL CITRATE PF 50 MCG/ML IJ SOSY
PREFILLED_SYRINGE | INTRAMUSCULAR | Status: AC
  Filled 2021-12-25: qty 2

## 2021-12-25 MED ORDER — CHLORHEXIDINE GLUCONATE 0.12 % MT SOLN
OROMUCOSAL | Status: AC
  Administered 2021-12-25: 07:00:00 15 mL
  Filled 2021-12-25: qty 60

## 2021-12-25 MED ORDER — LACTATED RINGERS IV SOLN
INTRAVENOUS | Status: DC
  Administered 2021-12-25: 07:00:00 1000 mL via INTRAVENOUS

## 2021-12-25 MED ORDER — HYDROXYZINE HCL 25 MG PO TABS: 25.0000 mg | ORAL_TABLET | Freq: Three times a day (TID) | ORAL | Status: DC | PRN

## 2021-12-25 MED ORDER — LACTATED RINGERS IV SOLN: INTRAVENOUS | Status: DC

## 2021-12-25 MED ORDER — RISPERIDONE 1 MG PO TABS
4.0000 mg | ORAL_TABLET | Freq: Every day | ORAL | Status: DC
  Administered 2021-12-25 – 2021-12-29 (×5): 4 mg via ORAL
  Filled 2021-12-25 (×5): qty 4

## 2021-12-25 MED ORDER — DEXAMETHASONE SODIUM PHOSPHATE 10 MG/ML IJ SOLN
INTRAMUSCULAR | Status: DC | PRN
  Administered 2021-12-25: 10 mg via INTRAVENOUS

## 2021-12-25 MED ORDER — SODIUM CHLORIDE 0.9 % IR SOLN
Status: DC | PRN
  Administered 2021-12-25 (×3): 1000 mL

## 2021-12-25 MED ORDER — DIPHENHYDRAMINE HCL 12.5 MG/5ML PO ELIX: 12.5000 mg | ORAL_SOLUTION | Freq: Four times a day (QID) | ORAL | Status: DC | PRN

## 2021-12-25 MED ORDER — ONDANSETRON HCL 4 MG/2ML IJ SOLN
INTRAMUSCULAR | Status: AC
  Filled 2021-12-25: qty 2

## 2021-12-25 MED ORDER — KETOROLAC TROMETHAMINE 30 MG/ML IJ SOLN
30.0000 mg | Freq: Four times a day (QID) | INTRAMUSCULAR | Status: DC
  Administered 2021-12-25 – 2021-12-29 (×14): 30 mg via INTRAVENOUS
  Filled 2021-12-25 (×14): qty 1

## 2021-12-25 MED ORDER — ALVIMOPAN 12 MG PO CAPS
12.0000 mg | ORAL_CAPSULE | ORAL | Status: AC
  Administered 2021-12-25: 07:00:00 12 mg via ORAL
  Filled 2021-12-25: qty 1

## 2021-12-25 MED ORDER — SEVOFLURANE IN SOLN
RESPIRATORY_TRACT | Status: AC
  Filled 2021-12-25: qty 250

## 2021-12-25 MED ORDER — PHENYLEPHRINE HCL (PRESSORS) 10 MG/ML IV SOLN
INTRAVENOUS | Status: DC | PRN
  Administered 2021-12-25: 200 ug via INTRAVENOUS
  Administered 2021-12-25 (×3): 80 ug via INTRAVENOUS
  Administered 2021-12-25 (×2): 160 ug via INTRAVENOUS
  Administered 2021-12-25: 80 ug via INTRAVENOUS
  Administered 2021-12-25 (×3): 160 ug via INTRAVENOUS
  Administered 2021-12-25: 200 ug via INTRAVENOUS
  Administered 2021-12-25 (×2): 160 ug via INTRAVENOUS

## 2021-12-25 MED ORDER — ONDANSETRON HCL 4 MG/2ML IJ SOLN: 4.0000 mg | Freq: Once | INTRAMUSCULAR | Status: DC | PRN

## 2021-12-25 MED ORDER — METOCLOPRAMIDE HCL 5 MG/ML IJ SOLN
INTRAMUSCULAR | Status: AC
  Filled 2021-12-25: qty 2

## 2021-12-25 MED ORDER — PROPOFOL 10 MG/ML IV BOLUS
INTRAVENOUS | Status: AC
  Filled 2021-12-25: qty 20

## 2021-12-25 MED ORDER — BUPIVACAINE LIPOSOME 1.3 % IJ SUSP
INTRAMUSCULAR | Status: DC | PRN
  Administered 2021-12-25: 20 mL

## 2021-12-25 MED ORDER — PROPOFOL 500 MG/50ML IV EMUL
INTRAVENOUS | Status: DC | PRN
  Administered 2021-12-25: 25 ug/kg/min via INTRAVENOUS

## 2021-12-25 MED ORDER — GABAPENTIN 300 MG PO CAPS
300.0000 mg | ORAL_CAPSULE | Freq: Three times a day (TID) | ORAL | Status: DC
  Administered 2021-12-25 – 2021-12-30 (×15): 300 mg via ORAL
  Filled 2021-12-25 (×15): qty 1

## 2021-12-25 MED ORDER — FENTANYL CITRATE (PF) 100 MCG/2ML IJ SOLN
INTRAMUSCULAR | Status: AC
  Filled 2021-12-25: qty 2

## 2021-12-25 MED ORDER — CHLORHEXIDINE GLUCONATE CLOTH 2 % EX PADS: 6.0000 | MEDICATED_PAD | Freq: Once | CUTANEOUS | Status: DC

## 2021-12-25 MED ORDER — ALBUTEROL SULFATE HFA 108 (90 BASE) MCG/ACT IN AERS: 2.0000 | INHALATION_SPRAY | Freq: Four times a day (QID) | RESPIRATORY_TRACT | Status: DC | PRN

## 2021-12-25 MED ORDER — DEXAMETHASONE SODIUM PHOSPHATE 10 MG/ML IJ SOLN
INTRAMUSCULAR | Status: AC
  Filled 2021-12-25: qty 1

## 2021-12-25 MED ORDER — ENSURE PRE-SURGERY PO LIQD
592.0000 mL | Freq: Once | ORAL | Status: AC
  Administered 2021-12-25: 04:00:00 592 mL via ORAL
  Filled 2021-12-25: qty 592

## 2021-12-25 MED ORDER — FENTANYL CITRATE PF 50 MCG/ML IJ SOSY
25.0000 ug | PREFILLED_SYRINGE | INTRAMUSCULAR | Status: DC | PRN
  Administered 2021-12-25 (×3): 50 ug via INTRAVENOUS
  Filled 2021-12-25: qty 1

## 2021-12-25 MED ORDER — ROCURONIUM BROMIDE 10 MG/ML (PF) SYRINGE
PREFILLED_SYRINGE | INTRAVENOUS | Status: DC | PRN
  Administered 2021-12-25: 20 mg via INTRAVENOUS
  Administered 2021-12-25 (×2): 10 mg via INTRAVENOUS
  Administered 2021-12-25: 60 mg via INTRAVENOUS
  Administered 2021-12-25 (×3): 20 mg via INTRAVENOUS

## 2021-12-25 MED ORDER — CYCLOBENZAPRINE HCL 10 MG PO TABS
10.0000 mg | ORAL_TABLET | Freq: Two times a day (BID) | ORAL | Status: DC
  Administered 2021-12-25 – 2021-12-30 (×11): 10 mg via ORAL
  Filled 2021-12-25 (×12): qty 1

## 2021-12-25 MED ORDER — TRAMADOL HCL 50 MG PO TABS
50.0000 mg | ORAL_TABLET | Freq: Four times a day (QID) | ORAL | Status: DC | PRN
  Administered 2021-12-27: 50 mg via ORAL
  Filled 2021-12-25: qty 1

## 2021-12-25 MED ORDER — ACETAMINOPHEN 10 MG/ML IV SOLN
1000.0000 mg | Freq: Once | INTRAVENOUS | Status: AC
  Administered 2021-12-25: 14:00:00 1000 mg via INTRAVENOUS

## 2021-12-25 MED ORDER — ROCURONIUM BROMIDE 10 MG/ML (PF) SYRINGE
PREFILLED_SYRINGE | INTRAVENOUS | Status: AC
  Filled 2021-12-25: qty 10

## 2021-12-25 MED ORDER — CLINDAMYCIN PHOSPHATE 900 MG/50ML IV SOLN
900.0000 mg | Freq: Three times a day (TID) | INTRAVENOUS | Status: AC
  Administered 2021-12-25 – 2021-12-26 (×2): 900 mg via INTRAVENOUS
  Filled 2021-12-25 (×3): qty 50

## 2021-12-25 MED ORDER — HYDROMORPHONE HCL 1 MG/ML IJ SOLN
0.5000 mg | INTRAMUSCULAR | Status: DC | PRN
  Administered 2021-12-26: 0.5 mg via INTRAVENOUS
  Filled 2021-12-25: qty 0.5

## 2021-12-25 MED ORDER — MIDAZOLAM HCL 5 MG/5ML IJ SOLN
INTRAMUSCULAR | Status: DC | PRN
  Administered 2021-12-25: 1 mg via INTRAVENOUS

## 2021-12-25 MED ORDER — LIDOCAINE 2% (20 MG/ML) 5 ML SYRINGE
INTRAMUSCULAR | Status: DC | PRN
  Administered 2021-12-25: 80 mg via INTRAVENOUS

## 2021-12-25 MED ORDER — ONDANSETRON HCL 4 MG/2ML IJ SOLN
INTRAMUSCULAR | Status: DC | PRN
  Administered 2021-12-25: 4 mg via INTRAVENOUS

## 2021-12-25 MED ORDER — EPHEDRINE SULFATE 50 MG/ML IJ SOLN
INTRAMUSCULAR | Status: DC | PRN
  Administered 2021-12-25: 5 mg via INTRAVENOUS
  Administered 2021-12-25 (×4): 10 mg via INTRAVENOUS
  Administered 2021-12-25 (×2): 5 mg via INTRAVENOUS

## 2021-12-25 MED ORDER — BUSPIRONE HCL 5 MG PO TABS
7.5000 mg | ORAL_TABLET | Freq: Every day | ORAL | Status: DC
  Administered 2021-12-25 – 2021-12-30 (×6): 7.5 mg via ORAL
  Filled 2021-12-25 (×7): qty 2

## 2021-12-25 MED ORDER — PROPOFOL 10 MG/ML IV BOLUS
INTRAVENOUS | Status: DC | PRN
  Administered 2021-12-25: 150 mg via INTRAVENOUS

## 2021-12-25 MED ORDER — HEPARIN SODIUM (PORCINE) 5000 UNIT/ML IJ SOLN
5000.0000 [IU] | Freq: Three times a day (TID) | INTRAMUSCULAR | Status: DC
  Administered 2021-12-26 – 2021-12-28 (×7): 5000 [IU] via SUBCUTANEOUS
  Filled 2021-12-25 (×7): qty 1

## 2021-12-25 MED ORDER — ONDANSETRON HCL 4 MG/2ML IJ SOLN: 4.0000 mg | Freq: Four times a day (QID) | INTRAMUSCULAR | Status: DC | PRN

## 2021-12-25 MED ORDER — BUPIVACAINE LIPOSOME 1.3 % IJ SUSP
INTRAMUSCULAR | Status: AC
  Filled 2021-12-25: qty 20

## 2021-12-25 MED ORDER — MIDAZOLAM HCL 2 MG/2ML IJ SOLN
INTRAMUSCULAR | Status: AC
  Filled 2021-12-25: qty 2

## 2021-12-25 MED ORDER — HYDROMORPHONE HCL 1 MG/ML IJ SOLN
INTRAMUSCULAR | Status: DC | PRN
  Administered 2021-12-25: 1 mg via INTRAVENOUS

## 2021-12-25 MED ORDER — NICOTINE 21 MG/24HR TD PT24
21.0000 mg | MEDICATED_PATCH | Freq: Every day | TRANSDERMAL | Status: DC
  Administered 2021-12-25 – 2021-12-30 (×6): 21 mg via TRANSDERMAL
  Filled 2021-12-25 (×7): qty 1

## 2021-12-25 MED ORDER — DIPHENHYDRAMINE HCL 50 MG/ML IJ SOLN
INTRAMUSCULAR | Status: AC
  Filled 2021-12-25: qty 1

## 2021-12-25 MED ORDER — DIPHENHYDRAMINE HCL 50 MG/ML IJ SOLN
INTRAMUSCULAR | Status: DC | PRN
  Administered 2021-12-25: 12.5 mg via INTRAVENOUS

## 2021-12-25 MED ORDER — LIDOCAINE HCL (PF) 2 % IJ SOLN
INTRAMUSCULAR | Status: AC
  Filled 2021-12-25: qty 10

## 2021-12-25 MED ORDER — SUGAMMADEX SODIUM 200 MG/2ML IV SOLN
INTRAVENOUS | Status: DC | PRN
  Administered 2021-12-25: 200 mg via INTRAVENOUS

## 2021-12-25 MED ORDER — ACETAMINOPHEN 500 MG PO TABS
1000.0000 mg | ORAL_TABLET | Freq: Four times a day (QID) | ORAL | Status: DC
  Administered 2021-12-26 – 2021-12-29 (×15): 1000 mg via ORAL
  Filled 2021-12-25 (×18): qty 2

## 2021-12-25 MED ORDER — ENSURE PRE-SURGERY PO LIQD
296.0000 mL | Freq: Once | ORAL | Status: DC
  Filled 2021-12-25: qty 296

## 2021-12-25 MED ORDER — ACETAMINOPHEN 10 MG/ML IV SOLN
INTRAVENOUS | Status: AC
  Filled 2021-12-25: qty 100

## 2021-12-25 MED ORDER — EPHEDRINE 5 MG/ML INJ
INTRAVENOUS | Status: AC
  Filled 2021-12-25: qty 5

## 2021-12-25 MED ORDER — BENZONATATE 100 MG PO CAPS: 100.0000 mg | ORAL_CAPSULE | Freq: Two times a day (BID) | ORAL | Status: DC | PRN

## 2021-12-25 MED ORDER — ONDANSETRON HCL 4 MG PO TABS: 4.0000 mg | ORAL_TABLET | Freq: Four times a day (QID) | ORAL | Status: DC | PRN

## 2021-12-25 MED ORDER — PHENYLEPHRINE HCL-NACL 20-0.9 MG/250ML-% IV SOLN
INTRAVENOUS | Status: DC | PRN
  Administered 2021-12-25: 200 ug/min via INTRAVENOUS

## 2021-12-25 MED ORDER — ORAL CARE MOUTH RINSE: 15.0000 mL | Freq: Once | OROMUCOSAL | Status: AC

## 2021-12-25 MED ORDER — KETOROLAC TROMETHAMINE 30 MG/ML IJ SOLN
INTRAMUSCULAR | Status: AC
  Filled 2021-12-25: qty 1

## 2021-12-25 MED ORDER — METOPROLOL TARTRATE 5 MG/5ML IV SOLN: 5.0000 mg | Freq: Four times a day (QID) | INTRAVENOUS | Status: DC | PRN

## 2021-12-25 MED ORDER — ACETAMINOPHEN 500 MG PO TABS
1000.0000 mg | ORAL_TABLET | ORAL | Status: AC
  Administered 2021-12-25: 07:00:00 1000 mg via ORAL
  Filled 2021-12-25: qty 2

## 2021-12-25 MED ORDER — CHLORHEXIDINE GLUCONATE 0.12 % MT SOLN
15.0000 mL | Freq: Once | OROMUCOSAL | Status: AC
  Administered 2021-12-25: 07:00:00 15 mL via OROMUCOSAL

## 2021-12-25 MED ORDER — FENTANYL CITRATE (PF) 250 MCG/5ML IJ SOLN
INTRAMUSCULAR | Status: DC | PRN
  Administered 2021-12-25 (×6): 50 ug via INTRAVENOUS

## 2021-12-25 MED ORDER — IRBESARTAN 150 MG PO TABS
75.0000 mg | ORAL_TABLET | Freq: Every day | ORAL | Status: DC
  Administered 2021-12-26 – 2021-12-30 (×5): 75 mg via ORAL
  Filled 2021-12-25 (×7): qty 1

## 2021-12-25 MED ORDER — KETOROLAC TROMETHAMINE 30 MG/ML IJ SOLN
INTRAMUSCULAR | Status: DC | PRN
  Administered 2021-12-25: 30 mg via INTRAVENOUS

## 2021-12-25 MED ORDER — DULOXETINE HCL 60 MG PO CPEP
60.0000 mg | ORAL_CAPSULE | Freq: Every day | ORAL | Status: DC
  Administered 2021-12-26 – 2021-12-30 (×5): 60 mg via ORAL
  Filled 2021-12-25 (×6): qty 1

## 2021-12-25 SURGICAL SUPPLY — 55 items
CLOTH BEACON ORANGE TIMEOUT ST (SAFETY) ×3 IMPLANT
COVER LIGHT HANDLE STERIS (MISCELLANEOUS) ×16 IMPLANT
DRSG OPSITE POSTOP 4X10 (GAUZE/BANDAGES/DRESSINGS) ×4 IMPLANT
DRSG TEGADERM 4X4.75 (GAUZE/BANDAGES/DRESSINGS) ×3 IMPLANT
ELECT BLADE 6 FLAT ULTRCLN (ELECTRODE) ×3 IMPLANT
ELECT REM PT RETURN 9FT ADLT (ELECTROSURGICAL) ×3
ELECTRODE REM PT RTRN 9FT ADLT (ELECTROSURGICAL) ×3 IMPLANT
GAUZE 4X4 16PLY ~~LOC~~+RFID DBL (SPONGE) ×1 IMPLANT
GAUZE SPONGE 4X4 12PLY STRL (GAUZE/BANDAGES/DRESSINGS) ×5 IMPLANT
GLOVE SURG POLYISO LF SZ6.5 (GLOVE) ×4 IMPLANT
GLOVE SURG POLYISO LF SZ7 (GLOVE) ×5 IMPLANT
GLOVE SURG POLYISO LF SZ7.5 (GLOVE) ×6 IMPLANT
GLOVE SURG UNDER POLY LF SZ6.5 (GLOVE) ×1 IMPLANT
GLOVE SURG UNDER POLY LF SZ7 (GLOVE) ×22 IMPLANT
GOWN STRL REUS W/TWL LRG LVL3 (GOWN DISPOSABLE) ×18 IMPLANT
INST SET MAJOR GENERAL (KITS) ×3 IMPLANT
KIT BLADEGUARD II DBL (SET/KITS/TRAYS/PACK) ×1 IMPLANT
KIT TURNOVER KIT A (KITS) ×3 IMPLANT
LIGASURE IMPACT 36 18CM CVD LR (INSTRUMENTS) ×3 IMPLANT
MANIFOLD NEPTUNE II (INSTRUMENTS) ×3 IMPLANT
MARKER SKIN DUAL TIP RULER LAB (MISCELLANEOUS) ×2 IMPLANT
MESH PHASIX ST 11CM (Mesh General) ×1 IMPLANT
MESH PHASIX ST 15X20 (Mesh General) ×1 IMPLANT
NDL HYPO 21X1.5 SAFETY (NEEDLE) IMPLANT
NEEDLE HYPO 21X1.5 SAFETY (NEEDLE) ×3 IMPLANT
NS IRRIG 1000ML POUR BTL (IV SOLUTION) ×12 IMPLANT
PACK COLON (CUSTOM PROCEDURE TRAY) ×3 IMPLANT
PAD ARMBOARD 7.5X6 YLW CONV (MISCELLANEOUS) ×4 IMPLANT
PENCIL HANDSWITCHING (ELECTRODE) ×1 IMPLANT
PENCIL SMOKE EVACUATOR COATED (MISCELLANEOUS) ×3 IMPLANT
RELOAD PROXIMATE 75MM BLUE (ENDOMECHANICALS) ×9 IMPLANT
RELOAD STAPLE 75 3.8 BLU REG (ENDOMECHANICALS) IMPLANT
RETRACTOR WND ALEXIS-O 25 LRG (MISCELLANEOUS) ×2 IMPLANT
RTRCTR WOUND ALEXIS O 25CM LRG (MISCELLANEOUS) ×3
SHEET LAVH (DRAPES) ×1 IMPLANT
SPONGE GAUZE 4X4 12PLY (GAUZE/BANDAGES/DRESSINGS) ×1 IMPLANT
SPONGE T-LAP 18X18 ~~LOC~~+RFID (SPONGE) ×8 IMPLANT
STAPLER CIRC CVD 29MM 37CM (STAPLE) ×1 IMPLANT
STAPLER GUN LINEAR PROX 60 (STAPLE) ×1 IMPLANT
STAPLER PROXIMATE 75MM BLUE (STAPLE) ×1 IMPLANT
STAPLER RELOADABLE 65 2-0 SUT (MISCELLANEOUS) IMPLANT
STAPLER SYS INTERNAL RELOAD SS (MISCELLANEOUS) ×3 IMPLANT
STAPLER VISISTAT (STAPLE) ×3 IMPLANT
SUT ETHILON 3 0 FSL (SUTURE) ×1 IMPLANT
SUT PDS AB CT VIOLET #0 27IN (SUTURE) ×15 IMPLANT
SUT SILK 3 0 SH CR/8 (SUTURE) ×6 IMPLANT
SUT VIC AB 0 CT1 27 (SUTURE) ×3
SUT VIC AB 0 CT1 27XCR 8 STRN (SUTURE) ×3 IMPLANT
SUT VIC AB 2-0 CT2 27 (SUTURE) ×2 IMPLANT
SUT VIC AB 3-0 SH 27 (SUTURE) ×30
SUT VIC AB 3-0 SH 27X BRD (SUTURE) IMPLANT
SYR BULB IRRIG 60ML STRL (SYRINGE) ×3 IMPLANT
TOWEL ~~LOC~~+RFID 17X26 BLUE (SPONGE) ×1 IMPLANT
TRAY FOLEY SLVR 16FR LF STAT (SET/KITS/TRAYS/PACK) ×1 IMPLANT
YANKAUER SUCT BULB TIP 10FT TU (MISCELLANEOUS) ×5 IMPLANT

## 2021-12-25 NOTE — Progress Notes (Signed)
[facility]  Updated his wife and mother about surgery.  Scheduled tylenol PRN narcotics IS, OOB Binder  NG in place, and hold the suction with meds Home meds  Foley in place, LR @ 100 Labs in the AM SCDs, heparin sq Telemetry   Family aware that Dr. Lovell Sheehan seeing him over the weekend.  Algis Greenhouse, MD [facility] [address] [place], [place] [address] [phone] (office)

## 2021-12-25 NOTE — OR Nursing (Signed)
Per Dr Henreitta Leber request, Pt's mother- Talbert Forest updated on Pt's condition and surgery.

## 2021-12-25 NOTE — Anesthesia Postprocedure Evaluation (Signed)
Anesthesia Post Note  Patient: [name] [name]  Procedure(s) Performed: COLOSTOMY REVERSAL (Abdomen) INCISIONAL HERNIA REPAIR WITH ABSORBABLE MESH (Abdomen) PARASTOMAL HERNIA REPAIR WITH ABSORBABLE MESH (Left: Abdomen)  Anesthesia Type: General Anesthetic complications: no   No notable events documented.   Last Vitals:  Vitals:   12/25/21 1415 12/25/21 1446  BP: 103/77 131/78  Pulse: 99 96  Resp: 12 16  Temp:  36.6 C  SpO2: 96% 94%    Last Pain:  Vitals:   12/25/21 1446  TempSrc: Axillary  PainSc: 8                  Windell Norfolk

## 2021-12-25 NOTE — Transfer of Care (Signed)
Immediate Anesthesia Transfer of Care Note  Patient: [name] [name]  Procedure(s) Performed: COLOSTOMY REVERSAL (Abdomen) INCISIONAL HERNIA REPAIR WITH ABSORBABLE MESH (Abdomen) PARASTOMAL HERNIA REPAIR WITH ABSORBABLE MESH (Left: Abdomen)  Patient Location: PACU  Anesthesia Type:General  Level of Consciousness: awake, alert  and oriented  Airway & Oxygen Therapy: Patient Spontanous Breathing and Patient connected to face mask oxygen  Post-op Assessment: Report given to RN, Post -op Vital signs reviewed and stable, Patient moving all extremities X 4 and Patient able to stick tongue midline  Post vital signs: Reviewed  Last Vitals:  Vitals Value Taken Time  BP 129/85 12/25/21 1325  Temp 37 C 12/25/21 1325  Pulse 99 12/25/21 1329  Resp 11 12/25/21 1329  SpO2 100 % 12/25/21 1329  Vitals shown include unvalidated device data.  Last Pain:  Vitals:   12/25/21 0645  TempSrc: Oral  PainSc: 8       Patients Stated Pain Goal: 8 (12/25/21 0645)  Complications: No notable events documented.

## 2021-12-25 NOTE — Anesthesia Procedure Notes (Signed)
Procedure Name: Intubation Date/Time: 12/25/2021 7:44 AM Performed by: Cy Blamer, CRNA Pre-anesthesia Checklist: Patient identified, Emergency Drugs available, Suction available and Patient being monitored Patient Re-evaluated:Patient Re-evaluated prior to induction Oxygen Delivery Method: Circle system utilized Preoxygenation: Pre-oxygenation with 100% oxygen Induction Type: IV induction Ventilation: Mask ventilation without difficulty Laryngoscope Size: Miller and 2 Grade View: Grade II Tube type: Oral Tube size: 7.5 mm Number of attempts: 1 Airway Equipment and Method: Stylet and Bite block Placement Confirmation: ETT inserted through vocal cords under direct vision, positive ETCO2 and breath sounds checked- equal and bilateral Secured at: 23 cm Tube secured with: Tape Dental Injury: Teeth and Oropharynx as per pre-operative assessment

## 2021-12-25 NOTE — Anesthesia Preprocedure Evaluation (Signed)
Anesthesia Evaluation  Patient identified by MRN, date of birth, ID band Patient awake    Reviewed: Allergy & Precautions, H&P , NPO status , Patient's Chart, lab work & pertinent test results, reviewed documented beta blocker date and time   Airway Mallampati: II  TM Distance: >3 FB Neck ROM: full    Dental no notable dental hx.    Pulmonary neg pulmonary ROS, Current Smoker and Patient abstained from smoking.,    Pulmonary exam normal breath sounds clear to auscultation       Cardiovascular Exercise Tolerance: Good hypertension, negative cardio ROS   Rhythm:regular Rate:Normal     Neuro/Psych PSYCHIATRIC DISORDERS Anxiety Depression Bipolar Disorder  Neuromuscular disease    GI/Hepatic negative GI ROS, Neg liver ROS,   Endo/Other  negative endocrine ROS  Renal/GU negative Renal ROS  negative genitourinary   Musculoskeletal   Abdominal   Peds  Hematology negative hematology ROS (+)   Anesthesia Other Findings   Reproductive/Obstetrics negative OB ROS                             Anesthesia Physical Anesthesia Plan  ASA: 2  Anesthesia Plan: General and General ETT   Post-op Pain Management:    Induction:   PONV Risk Score and Plan: Ondansetron  Airway Management Planned:   Additional Equipment:   Intra-op Plan:   Post-operative Plan:   Informed Consent: I have reviewed the patients History and Physical, chart, labs and discussed the procedure including the risks, benefits and alternatives for the proposed anesthesia with the patient or authorized representative who has indicated his/her understanding and acceptance.     Dental Advisory Given  Plan Discussed with: CRNA  Anesthesia Plan Comments:         Anesthesia Quick Evaluation

## 2021-12-25 NOTE — Interval H&P Note (Signed)
History and Physical Interval Note:  12/25/2021 7:27 AM  [name] [name]  has presented today for surgery, with the diagnosis of Colostomy in place.  The various methods of treatment have been discussed with the patient and family. After consideration of risks, benefits and other options for treatment, the patient has consented to  Procedure(s): COLOSTOMY REVERSAL (N/A) as a surgical intervention.  The patient's history has been reviewed, patient examined, no change in status, stable for surgery.  I have reviewed the patient's chart and labs.  Questions were answered to the patient's satisfaction.    Bowel prep completed. Understands attempt at closing hernia with absorbable mesh will likely result in some degree of recurrence. Understands risk of bleeding, leak, infection, injury to other organs, has tried to quit smoking and is greatly reduced his intake. Understands this puts him at higher risk for above.   Lucretia Roers

## 2021-12-25 NOTE — Op Note (Signed)
[facility] Operative Note  12/25/21  Preoperative Diagnosis:  Colostomy in place, parastomal hernia, incisional hernia    Postoperative Diagnosis: Same   Procedure(s) Performed: Colostomy reversal, small bowel resection, parastomal hernia repair with rectro-rectus Phasix ST Mesh (11cm circular trimmed), and Incisional Hernia repair with partial primary closure and bridging Phasix ST Mesh 15X20cm    Surgeon: Leatrice Jewels. Henreitta Leber, MD   Assistants: Theophilus Kinds, DO   Anesthesia: General endotracheal   Anesthesiologist: Windell Norfolk, MD    Specimens: End colostomy   Estimated Blood Loss: 150cc    Blood Replacement: None    Complications: None   Wound Class: Contaminated    Operative Indications:  [name] [name] is a 55 yo who had an end colostomy in place after a rectal perforation. He has a ventral hernia and a parastomal hernia too noted on CT scan.  We discussed that we would reverse his colostomy and the risk of bleeding, infection, injury to other organs, leak, and attempt to repair the hernia to with an absorbable material with likely recurrence and need for a more permanent repair in the future.   Findings: Adhesions of the small bowel to the anterior abdominal wall; parastomal hernia with colon, ventral hernia with small bowel, large hernia sac, right side rectus muscle retracted with no obvious anterior or posterior sheath noted.    Procedure: The patient was taken to the operating room and placed supine. General endotracheal anesthesia was induced. Intravenous antibiotics were administered per protocol.  An orogastric tube positioned to decompress the stomach. A foley catheter was placed. He was then placed in lithotomy position with all pressure points padded.  The colostomy was oversewed with a running, locked 3-0 silk suture to prevent spillage. The abdomen and perineum were prepared and draped in the usual sterile fashion.   The midline incision  was opened and carried down to the subcutaneous tissue with care. On the superior portion of the incision, the hernia sac was noted and the fascia, and the hernia sac was entered with care with scissors. The remaining fascia and hernia sac were opened down the length of the incision.  Adhesions of omentum and small bowel were taken down from the anterior abdominal wall with sharp dissection with scissors and cautery. A serosal tear was noted on the antimesenteric aspect of one of the loops of bowel that was about 2cm in length, this was oversewn with lembert 3-0 Silk to prevent it from becoming full thickness until this could be resected.    The small bowel was freed up, and attention was turned to the left sided colostomy site. An elliptical skin incision was made around the closed colostomy and carried down into the subcutaneous tissue. Care was taken to not enter the colon, and the colon was dissected out from the colostomy site and the parastomal hernia sac.  A 75 linear cutting stapler was used to come across the end of the colostomy to prevent any spillage, and this was specimen was passed off the field.  The end of the colon was freed from the rectus muscle and fascia and delivered into he abdomen. There was sufficient length to reach into the pelvis with no tension.  The small bowel and colon were packed into the abdomen, and the patient was placed into Trendelenburg position to get better exposure of the deep and narrow pelvis. Based on prior CT imaging with rectal contrast the rectal stump was noted to be at least 10 cm long and was  curving up the pelvis.  There was some difficulty getting the rectal stump freed up due to redundant peritoneal layers that had encased the rectum.  My partner went below and used EEA sizers to help demonstrate the true portions of the rectum as I dissected it out with blunt dissection and sharp dissection.  The rectal stump was freed up sufficiently to allow for a 29 EEA  to be placed to a clear region anterior to the staple line. During this time the retroperitoneal structures were not disturbed.   The packing was removed and the end of the colon was transected taking about 5cm of the distal end given a concern for possible diverticula in the area.  Another 75 mm linear cutting stapler was used to come across the point of transection. The automatic pursestringer was then used to and the anvil of the EEA 29 was placed into the proximal end and secured down.  An additional 3-0 Nylon pursestring suture was placed around the anvil. The end of the colon was cleared of extra fat.  The small bowel was packed into the right upper quadrant, and my partner placed the EEA into the anus with gentle manipulation.  The EEA spike was deployed and the anvil and spike were joined. The stapler was tightened down ensuring nothing came into the staple line. The stapler was fired and the anvil was removed along with the stapler. A leak test was performed and was negative. Two good intact donuts were noted.    The packing was removed. The small bowel was ran and the serosal tear was resected in the standard fashion using 2 linear 75 mm cutting staplers, and the ligasure was used to take the mesentery. The two ends were stapled in the side to side fashion using a third 75 mm linear cutting stapler and a TA 60 to close the common enterotomy. The staple line was oversewn with 3-0 Lembert suture and the mesentery was closed with 3-0 Vicryl.  An NG was placed in exchange for the OG given the small bowel resection, and was verified.   Irrigation was performed. The colostomy site was investigated. The posterior rectus sheath was identifiable and this was dissected out and closed with 0 Vicryl interrupted sutures in the transverse fashion to prevent any lateral traction for the midline closure. A Phasix ST mesh 11 cm circular was trimmed down to about 8cm and placed over the closure and secured with 0 PDS  sutures to the posterior fascia. The anterior fascia was then closed with 0 Vicryl interrupted sutures to cover the mesh.    The entire team changed gowns and gloves and new closing equipment was used.  The right sided rectus was retracted laterally and the anterior and posterior fascia were retracted. Given that this is a contaminated case with likelihood for hernia recurrence, I felt like mobilization of the rectus sheaths would set the patient up for inability to get a more formal component separation in the future, so I opted to avoid this approach.  The defect measured 10X15cm in size.   A Phasix ST mesh 15X20cm was used as a bridge to give the patient some domain without destroying his chances of a permanent mesh repair in the future. The mesh was tacked to the abdominal wall along the edges using 0 PDS suture in various spots to prevent any bowel from becoming incarcerated.  Using 0 PDS the fascia superiorly and inferiorly was brought together with a figure of 8 suture.  Likely  shrinking the vertical extent of the hernia by 6cm.   Given that the entire length of the anterior fascia could not be closed over the mesh, the hernia sac was saved in order to provide a layer over the mesh.  The hernia sac coming from the right side was sutured down to close the dead space over the rectus on the right and the secured to the left sided fascia to close the layer from the inferior to superior fascial sutures. This was done with 2-0 Vicryl sutures.  More irrigation was performed in both sites.  The midline and colostomy site were closed deep with 3-0 Vicryl interrupted suture to close the dead space from the hernia defects.  The skin of the colostomy and midline were closed with staples and dressings were placed.   The foley catheter and NG will remain in place.  Dr. Theophilus Kinds, DO was assisting throughout the procedure and was present for the critical portions of the case.   Final inspection  revealed acceptable hemostasis. All counts were correct at the end of the case. The patient was awakened from anesthesia and extubated without complication.  The patient went to the PACU in stable condition.   [name] Greenhouse, MD [facility] [address] [place], [place] [address] [phone] (office)

## 2021-12-26 LAB — CBC WITH DIFFERENTIAL/PLATELET
Abs Immature Granulocytes: 0.11 10*3/uL — ABNORMAL HIGH (ref 0.00–0.07)
Basophils Absolute: 0 10*3/uL (ref 0.0–0.1)
Basophils Relative: 0 %
Eosinophils Absolute: 0 10*3/uL (ref 0.0–0.5)
Eosinophils Relative: 0 %
HCT: 40.9 % (ref 39.0–52.0)
Hemoglobin: 14.3 g/dL (ref 13.0–17.0)
Immature Granulocytes: 1 %
Lymphocytes Relative: 9 %
Lymphs Abs: 1.6 10*3/uL (ref 0.7–4.0)
MCH: 30.5 pg (ref 26.0–34.0)
MCHC: 35 g/dL (ref 30.0–36.0)
MCV: 87.2 fL (ref 80.0–100.0)
Monocytes Absolute: 1.2 10*3/uL — ABNORMAL HIGH (ref 0.1–1.0)
Monocytes Relative: 7 %
Neutro Abs: 14.7 10*3/uL — ABNORMAL HIGH (ref 1.7–7.7)
Neutrophils Relative %: 83 %
Platelets: 227 10*3/uL (ref 150–400)
RBC: 4.69 MIL/uL (ref 4.22–5.81)
RDW: 13 % (ref 11.5–15.5)
WBC: 17.7 10*3/uL — ABNORMAL HIGH (ref 4.0–10.5)
nRBC: 0 % (ref 0.0–0.2)

## 2021-12-26 LAB — BASIC METABOLIC PANEL
Anion gap: 10 (ref 5–15)
BUN: 22 mg/dL — ABNORMAL HIGH (ref 6–20)
CO2: 24 mmol/L (ref 22–32)
Calcium: 8.2 mg/dL — ABNORMAL LOW (ref 8.9–10.3)
Chloride: 100 mmol/L (ref 98–111)
Creatinine, Ser: 1.14 mg/dL (ref 0.61–1.24)
GFR, Estimated: >60 mL/min (ref >60)
Glucose, Bld: 130 mg/dL — ABNORMAL HIGH (ref 70–99)
Potassium: 4.3 mmol/L (ref 3.5–5.1)
Sodium: 134 mmol/L — ABNORMAL LOW (ref 135–145)

## 2021-12-26 LAB — MAGNESIUM: Magnesium: 1.7 mg/dL (ref 1.7–2.4)

## 2021-12-26 LAB — PHOSPHORUS: Phosphorus: 3.9 mg/dL (ref 2.5–4.6)

## 2021-12-26 MED ORDER — LACTATED RINGERS IV BOLUS
1000.0000 mL | Freq: Once | INTRAVENOUS | Status: AC
  Administered 2021-12-26: 1000 mL via INTRAVENOUS

## 2021-12-26 MED ORDER — CHLORHEXIDINE GLUCONATE CLOTH 2 % EX PADS
6.0000 | MEDICATED_PAD | Freq: Every day | CUTANEOUS | Status: DC
  Administered 2021-12-27 – 2021-12-30 (×4): 6 via TOPICAL

## 2021-12-26 MED ORDER — MENTHOL 3 MG MT LOZG: 1.0000 | LOZENGE | OROMUCOSAL | Status: DC | PRN

## 2021-12-26 NOTE — Progress Notes (Signed)
1 Day Post-Op  Subjective: Patient having minimal incisional pain.  It is well controlled with medication.  No bowel movement or flatus yet.  Objective: Vital signs in last 24 hours: Temp:  [97.8 F (36.6 C)-99.2 F (37.3 C)] 98 F (36.7 C) (12/31 0446) Pulse Rate:  [90-112] 96 (12/31 0446) Resp:  [12-21] 18 (12/31 0446) BP: (98-131)/(71-90) 117/78 (12/31 0446) SpO2:  [94 %-100 %] 96 % (12/31 0446) Weight:  [112.8 kg] 112.8 kg (12/31 0519)    Intake/Output from previous day: 12/30 0701 - 12/31 0700 In: 4011.6 [I.V.:3858.3; IV Piggyback:153.2] Out: 680 [Urine:530; Blood:150] Intake/Output this shift: No intake/output data recorded.  General appearance: alert, cooperative, and no distress Resp: clear to auscultation bilaterally Cardio: regular rate and rhythm, S1, S2 normal, no murmur, click, rub or gallop GI: Soft, incisions healing well.  Minimal bowel sounds appreciated.  Lab Results:  Recent Labs    12/23/21 1155 12/26/21 0626  WBC 11.1* 17.7*  HGB 15.6 14.3  HCT 44.4 40.9  PLT 256 227   BMET Recent Labs    12/23/21 1155 12/26/21 0626  NA 133* 134*  K 3.8 4.3  CL 102 100  CO2 23 24  GLUCOSE 136* 130*  BUN 6 22*  CREATININE 0.82 1.14  CALCIUM 8.4* 8.2*   PT/INR No results for input(s): LABPROT, INR in the last 72 hours.  Studies/Results: No results found.  Anti-infectives: Anti-infectives (From admission, onward)    Start     Dose/Rate Route Frequency Ordered Stop   12/25/21 1600  clindamycin (CLEOCIN) IVPB 900 mg        900 mg 100 mL/hr over 30 Minutes Intravenous Every 8 hours 12/25/21 1443 12/26/21 0051   12/24/21 0951  clindamycin (CLEOCIN) IVPB 900 mg       See Hyperspace for full Linked Orders Report.   900 mg 100 mL/hr over 30 Minutes Intravenous 60 min pre-op 12/24/21 0951 12/25/21 0745   12/24/21 0951  gentamicin (GARAMYCIN) 440 mg in dextrose 5 % 100 mL IVPB       See Hyperspace for full Linked Orders Report.   5 mg/kg  88.5 kg  (Adjusted) 111 mL/hr over 60 Minutes Intravenous 60 min pre-op 12/24/21 0951 12/25/21 0755       Assessment/Plan: s/p Procedure(s): COLOSTOMY REVERSAL INCISIONAL HERNIA REPAIR WITH ABSORBABLE MESH PARASTOMAL HERNIA REPAIR WITH ABSORBABLE MESH Impression: Stable on postoperative day 1.  Urine output marginal.  We will give fluid bolus.  We will get patient up to chair.  Continue Foley placement for now.  Continue NG tube for now.  Awaiting return of bowel function.  LOS: 1 day    Jocob Dambach 12/26/2021

## 2021-12-27 ENCOUNTER — Other Ambulatory Visit: Payer: Self-pay

## 2021-12-27 LAB — CBC
HCT: 36.8 % — ABNORMAL LOW (ref 39.0–52.0)
Hemoglobin: 12.5 g/dL — ABNORMAL LOW (ref 13.0–17.0)
MCH: 29.8 pg (ref 26.0–34.0)
MCHC: 34 g/dL (ref 30.0–36.0)
MCV: 87.6 fL (ref 80.0–100.0)
Platelets: 178 10*3/uL (ref 150–400)
RBC: 4.2 MIL/uL — ABNORMAL LOW (ref 4.22–5.81)
RDW: 12.8 % (ref 11.5–15.5)
WBC: 12.3 10*3/uL — ABNORMAL HIGH (ref 4.0–10.5)
nRBC: 0 % (ref 0.0–0.2)

## 2021-12-27 LAB — BASIC METABOLIC PANEL
Anion gap: 9 (ref 5–15)
BUN: 24 mg/dL — ABNORMAL HIGH (ref 6–20)
CO2: 24 mmol/L (ref 22–32)
Calcium: 7.8 mg/dL — ABNORMAL LOW (ref 8.9–10.3)
Chloride: 100 mmol/L (ref 98–111)
Creatinine, Ser: 0.99 mg/dL (ref 0.61–1.24)
GFR, Estimated: >60 mL/min (ref >60)
Glucose, Bld: 101 mg/dL — ABNORMAL HIGH (ref 70–99)
Potassium: 3.6 mmol/L (ref 3.5–5.1)
Sodium: 133 mmol/L — ABNORMAL LOW (ref 135–145)

## 2021-12-27 LAB — MAGNESIUM: Magnesium: 1.7 mg/dL (ref 1.7–2.4)

## 2021-12-27 LAB — PHOSPHORUS: Phosphorus: 2.9 mg/dL (ref 2.5–4.6)

## 2021-12-27 MED ORDER — BOOST / RESOURCE BREEZE PO LIQD CUSTOM
1.0000 | Freq: Three times a day (TID) | ORAL | Status: DC
  Administered 2021-12-27 – 2021-12-30 (×5): 1 via ORAL

## 2021-12-27 NOTE — Progress Notes (Signed)
Pt was able to ambulate to bathroom . Pt is able to make needs known.Pt takes medication whole with sips of water and is still NPO. Will continue to monitor. Call bell placed in reach. NG Tube secure, and set to intermittent suction.

## 2021-12-27 NOTE — Progress Notes (Signed)
°   12/27/21 1357  Vitals  Temp 98.7 F (37.1 C)  Temp Source Oral  BP 138/90  MAP (mmHg) 104  Pulse Rate (!) 120  Pulse Rate Source Left;Radial  Resp 20  MEWS COLOR  MEWS Score Color Yellow  Oxygen Therapy  SpO2 96 %  O2 Device Room Air  MEWS Score  MEWS Temp 0  MEWS Systolic 0  MEWS Pulse 2  MEWS RR 0  MEWS LOC 0  MEWS Score 2  Provider Notification  Provider Name/Title MD Arnoldo Morale  Date Provider Notified 12/27/21  Time Provider Notified [phone]   Awaiting provider response.

## 2021-12-27 NOTE — Plan of Care (Signed)
  Problem: Education: Goal: Knowledge of General Education information will improve Description Including pain rating scale, medication(s)/side effects and non-pharmacologic comfort measures Outcome: Progressing   Problem: Health Behavior/Discharge Planning: Goal: Ability to manage health-related needs will improve Outcome: Progressing   

## 2021-12-27 NOTE — Progress Notes (Addendum)
Pt had three bowel movements on previous shift that was reported and documented on prior shift.Send on call message for fyi. No new orders at this time. Will continue to monitor.

## 2021-12-27 NOTE — Progress Notes (Signed)
2 Days Post-Op  Subjective: Patient is passing flatus and has had multiple bowel movements.  Denies any significant abdominal pain.  Objective: Vital signs in last 24 hours: Temp:  [97.9 F (36.6 C)-98.2 F (36.8 C)] 97.9 F (36.6 C) (12/31 2251) Pulse Rate:  [82-97] 97 (12/31 2251) Resp:  [20] 20 (12/31 2251) BP: (120-126)/(80-87) 120/80 (12/31 2251) SpO2:  [93 %-97 %] 93 % (12/31 2251) Weight:  [177 kg] 112 kg (01/01 0508) Last BM Date: 12/26/21 (X3)  Intake/Output from previous day: 12/31 0701 - 01/01 0700 In: -  Out: 2370 [Urine:1520; Emesis/NG output:850] Intake/Output this shift: Total I/O In: -  Out: 700 [Emesis/NG output:700]  General appearance: alert, cooperative, and no distress Resp: clear to auscultation bilaterally Cardio: regular rate and rhythm, S1, S2 normal, no murmur, click, rub or gallop GI: Soft, bowel sounds present.  Incision healing well.  Lab Results:  Recent Labs    12/26/21 0626 12/27/21 0301  WBC 17.7* 12.3*  HGB 14.3 12.5*  HCT 40.9 36.8*  PLT 227 178   BMET Recent Labs    12/26/21 0626 12/27/21 0301  NA 134* 133*  K 4.3 3.6  CL 100 100  CO2 24 24  GLUCOSE 130* 101*  BUN 22* 24*  CREATININE 1.14 0.99  CALCIUM 8.2* 7.8*   PT/INR No results for input(s): LABPROT, INR in the last 72 hours.  Studies/Results: No results found.  Anti-infectives: Anti-infectives (From admission, onward)    Start     Dose/Rate Route Frequency Ordered Stop   12/25/21 1600  clindamycin (CLEOCIN) IVPB 900 mg        900 mg 100 mL/hr over 30 Minutes Intravenous Every 8 hours 12/25/21 1443 12/26/21 0051   12/24/21 0951  clindamycin (CLEOCIN) IVPB 900 mg       See Hyperspace for full Linked Orders Report.   900 mg 100 mL/hr over 30 Minutes Intravenous 60 min pre-op 12/24/21 0951 12/25/21 0745   12/24/21 0951  gentamicin (GARAMYCIN) 440 mg in dextrose 5 % 100 mL IVPB       See Hyperspace for full Linked Orders Report.   5 mg/kg  88.5 kg  (Adjusted) 111 mL/hr over 60 Minutes Intravenous 60 min pre-op 12/24/21 0951 12/25/21 0755       Assessment/Plan: s/p Procedure(s): COLOSTOMY REVERSAL INCISIONAL HERNIA REPAIR WITH ABSORBABLE MESH PARASTOMAL HERNIA REPAIR WITH ABSORBABLE MESH Impression: Continuing to recover well.  Urine output improved.  GI function returning.  Has no significant complaints. Plan: Will DC telemetry, NG tube, and Foley.  Advance to full liquid diet.  Continue to ambulate and sit in a chair.  LOS: 2 days    Merritt Mccravy 12/27/2021

## 2021-12-28 LAB — CBC
HCT: 32.3 % — ABNORMAL LOW (ref 39.0–52.0)
Hemoglobin: 11.3 g/dL — ABNORMAL LOW (ref 13.0–17.0)
MCH: 30.5 pg (ref 26.0–34.0)
MCHC: 35 g/dL (ref 30.0–36.0)
MCV: 87.3 fL (ref 80.0–100.0)
Platelets: 185 10*3/uL (ref 150–400)
RBC: 3.7 MIL/uL — ABNORMAL LOW (ref 4.22–5.81)
RDW: 12.4 % (ref 11.5–15.5)
WBC: 9.3 10*3/uL (ref 4.0–10.5)
nRBC: 0 % (ref 0.0–0.2)

## 2021-12-28 LAB — BASIC METABOLIC PANEL
Anion gap: 8 (ref 5–15)
BUN: 10 mg/dL (ref 6–20)
CO2: 27 mmol/L (ref 22–32)
Calcium: 8 mg/dL — ABNORMAL LOW (ref 8.9–10.3)
Chloride: 102 mmol/L (ref 98–111)
Creatinine, Ser: 0.77 mg/dL (ref 0.61–1.24)
GFR, Estimated: >60 mL/min (ref >60)
Glucose, Bld: 102 mg/dL — ABNORMAL HIGH (ref 70–99)
Potassium: 3.5 mmol/L (ref 3.5–5.1)
Sodium: 137 mmol/L (ref 135–145)

## 2021-12-28 LAB — PHOSPHORUS: Phosphorus: 2.7 mg/dL (ref 2.5–4.6)

## 2021-12-28 LAB — MAGNESIUM: Magnesium: 1.7 mg/dL (ref 1.7–2.4)

## 2021-12-28 MED ORDER — ENOXAPARIN SODIUM 60 MG/0.6ML IJ SOSY
60.0000 mg | PREFILLED_SYRINGE | Freq: Every day | INTRAMUSCULAR | Status: DC
  Administered 2021-12-28 – 2021-12-30 (×3): 60 mg via SUBCUTANEOUS
  Filled 2021-12-28 (×4): qty 0.6

## 2021-12-28 MED ORDER — MAGNESIUM SULFATE 2 GM/50ML IV SOLN
2.0000 g | Freq: Once | INTRAVENOUS | Status: AC
  Administered 2021-12-28: 2 g via INTRAVENOUS
  Filled 2021-12-28: qty 50

## 2021-12-28 MED ORDER — TRAMADOL HCL 50 MG PO TABS
50.0000 mg | ORAL_TABLET | Freq: Four times a day (QID) | ORAL | Status: DC | PRN
  Administered 2021-12-28 – 2021-12-29 (×2): 100 mg via ORAL
  Administered 2021-12-30: 50 mg via ORAL
  Filled 2021-12-28: qty 1
  Filled 2021-12-28 (×2): qty 2

## 2021-12-28 NOTE — TOC Progression Note (Signed)
Transition of Care [facility]) - Progression Note    Patient Details  Name: [name] [name] MRN: [id number] Date of Birth: 09-May-1966  Transition of Care [facility]) CM/SW Contact  Karn Cassis, [place] Phone Number: 12/28/2021, 10:17 AM  Clinical Narrative:   Transition of Care (TOC) Screening Note   Patient Details  Name: [name] [name] Date of Birth: 1966-05-02   Transition of Care [facility]) CM/SW Contact:    Karn Cassis, LCSW Phone Number: 12/28/2021, 10:18 AM    Transition of Care Department [facility]) has reviewed patient and no TOC needs have been identified at this time. We will continue to monitor patient advancement through interdisciplinary progression rounds. If new patient transition needs arise, please place a TOC consult.            Expected Discharge Plan and Services                                                 Social Determinants of Health (SDOH) Interventions    Readmission Risk Interventions Readmission Risk Prevention Plan 08/06/2019  Post Dischage Appt Complete  Medication Screening Complete  Transportation Screening Complete  Some recent data might be hidden

## 2021-12-28 NOTE — Progress Notes (Addendum)
[facility] Progress Note  3 Days Post-Op  Subjective: Resting, feels like he has more pain today than before. Otherwise no nausea and having Bms.   Objective: Vital signs in last 24 hours: Temp:  [98.1 F (36.7 C)-98.7 F (37.1 C)] 98.2 F (36.8 C) (01/02 0534) Pulse Rate:  [84-120] 105 (01/02 0534) Resp:  [16-20] 19 (01/02 0534) BP: (130-138)/(84-90) 130/88 (01/02 0534) SpO2:  [91 %-96 %] 92 % (01/02 0534) Weight:  [115.5 kg] 115.5 kg (01/02 0535) Last BM Date: 12/27/21  Intake/Output from previous day: 01/01 0701 - 01/02 0700 In: 850 [P.O.:850] Out: 3100 [Urine:2400; Emesis/NG output:700] Intake/Output this shift: No intake/output data recorded.  General appearance: alert and no distress Resp: normal work of breathing GI: soft, mildly distended, appropriately tender, RLQ area where prior hernia is is full, likely from seroma formation, binder placed on lower part of abdomen and encouraged to wear, dressing c/d/I with minimal old blood, no erythema or drainage  Lab Results:  Recent Labs    12/27/21 0301 12/28/21 0554  WBC 12.3* 9.3  HGB 12.5* 11.3*  HCT 36.8* 32.3*  PLT 178 185   BMET Recent Labs    12/27/21 0301 12/28/21 0554  NA 133* 137  K 3.6 3.5  CL 100 102  CO2 24 27  GLUCOSE 101* 102*  BUN 24* 10  CREATININE 0.99 0.77  CALCIUM 7.8* 8.0*   PT/INR No results for input(s): LABPROT, INR in the last 72 hours.  Studies/Results: No results found.  Anti-infectives: Anti-infectives (From admission, onward)    Start     Dose/Rate Route Frequency Ordered Stop   12/25/21 1600  clindamycin (CLEOCIN) IVPB 900 mg        900 mg 100 mL/hr over 30 Minutes Intravenous Every 8 hours 12/25/21 1443 12/26/21 0051   12/24/21 0951  clindamycin (CLEOCIN) IVPB 900 mg       See Hyperspace for full Linked Orders Report.   900 mg 100 mL/hr over 30 Minutes Intravenous 60 min pre-op 12/24/21 0951 12/25/21 0745   12/24/21 0951  gentamicin (GARAMYCIN)  440 mg in dextrose 5 % 100 mL IVPB       See Hyperspace for full Linked Orders Report.   5 mg/kg  88.5 kg (Adjusted) 111 mL/hr over 60 Minutes Intravenous 60 min pre-op 12/24/21 8372 12/25/21 0755       Assessment/Plan: Patient s/p colostomy reversal and repair of parastomal and incisional hernia with phasix mesh. Doing well overall.  Toradol scheduled, tylenol scheduled PRN tramadol increased to 50-100 q6 PRN, says dilaudid does not work IS, OOB Ambulate HD good Soft diet UOP good, Cr back to baseline, LR off Heparin sq changed to lovenox sq SCDs CBC in AM given drift H&H, no signs of bleeding   Family and RN updated at bedside.   LOS: 3 days    Lucretia Roers 12/28/2021

## 2021-12-28 NOTE — Progress Notes (Signed)
PHARMACIST - PHYSICIAN COMMUNICATION  CONCERNING:  Enoxaparin (Lovenox) for DVT Prophylaxis    RECOMMENDATION: Patient was prescribed enoxaprin 40mg  q24 hours for VTE prophylaxis.   Filed Weights   12/27/21 0500 12/27/21 0508 12/28/21 0535  Weight: 112 kg (247 lb) 112 kg (247 lb) 115.5 kg (254 lb 10.1 oz)    Body mass index is 35.51 kg/m.  Estimated Creatinine Clearance: 134.9 mL/min (by C-G formula based on SCr of 0.77 mg/dL).   Based on [facility] patient is candidate for enoxaparin 0.5mg /kg TBW SQ every 24 hours based on BMI being >30.  DESCRIPTION: Pharmacy has adjusted enoxaparin dose per [facility] policy.  Patient is now receiving enoxaparin 60 mg every 24 hours    Ena Dawley, PharmD Clinical Pharmacist  12/28/2021 10:43 AM

## 2021-12-29 ENCOUNTER — Encounter (HOSPITAL_COMMUNITY): Payer: Self-pay | Admitting: General Surgery

## 2021-12-29 LAB — CBC WITH DIFFERENTIAL/PLATELET
Abs Immature Granulocytes: 0.06 K/uL (ref 0.00–0.07)
Basophils Absolute: 0 K/uL (ref 0.0–0.1)
Basophils Relative: 0 %
Eosinophils Absolute: 0.4 K/uL (ref 0.0–0.5)
Eosinophils Relative: 4 %
HCT: 33.9 % — ABNORMAL LOW (ref 39.0–52.0)
Hemoglobin: 11.5 g/dL — ABNORMAL LOW (ref 13.0–17.0)
Immature Granulocytes: 1 %
Lymphocytes Relative: 26 %
Lymphs Abs: 2.5 K/uL (ref 0.7–4.0)
MCH: 29.6 pg (ref 26.0–34.0)
MCHC: 33.9 g/dL (ref 30.0–36.0)
MCV: 87.4 fL (ref 80.0–100.0)
Monocytes Absolute: 0.5 K/uL (ref 0.1–1.0)
Monocytes Relative: 5 %
Neutro Abs: 6.4 K/uL (ref 1.7–7.7)
Neutrophils Relative %: 64 %
Platelets: 225 K/uL (ref 150–400)
RBC: 3.88 MIL/uL — ABNORMAL LOW (ref 4.22–5.81)
RDW: 12.3 % (ref 11.5–15.5)
WBC: 9.8 K/uL (ref 4.0–10.5)
nRBC: 0 % (ref 0.0–0.2)

## 2021-12-29 LAB — SURGICAL PATHOLOGY

## 2021-12-29 MED ORDER — IBUPROFEN 600 MG PO TABS
600.0000 mg | ORAL_TABLET | Freq: Four times a day (QID) | ORAL | Status: DC | PRN
  Administered 2021-12-29 (×2): 600 mg via ORAL
  Filled 2021-12-29 (×2): qty 1

## 2021-12-29 NOTE — Plan of Care (Signed)

## 2021-12-29 NOTE — Progress Notes (Signed)
[facility] Progress Note  4 Days Post-Op  Subjective: No major issues. Tolerated soft diet. Having Bms.   Objective: Vital signs in last 24 hours: Temp:  [98.4 F (36.9 C)-98.8 F (37.1 C)] 98.4 F (36.9 C) (01/03 0513) Pulse Rate:  [87-101] 93 (01/03 0513) Resp:  [16-18] 18 (01/03 0513) BP: (116-134)/(77-88) 116/88 (01/03 0513) SpO2:  [89 %-95 %] 89 % (01/03 0513) Weight:  [110.4 kg] 110.4 kg (01/03 0517) Last BM Date: 12/27/21  Intake/Output from previous day: 01/02 0701 - 01/03 0700 In: 960 [P.O.:960] Out: 700 [Urine:700] Intake/Output this shift: No intake/output data recorded.  General appearance: alert and no distress Resp: normal work of breathing GI: soft, appropriately tender, staples c/d/I without erythema or drainage, dressing replaced, bruising seroma in the right lower abdomen where prior hernia sac was located    Lab Results:  Recent Labs    12/28/21 0554 12/29/21 0529  WBC 9.3 9.8  HGB 11.3* 11.5*  HCT 32.3* 33.9*  PLT 185 225   BMET Recent Labs    12/27/21 0301 12/28/21 0554  NA 133* 137  K 3.6 3.5  CL 100 102  CO2 24 27  GLUCOSE 101* 102*  BUN 24* 10  CREATININE 0.99 0.77  CALCIUM 7.8* 8.0*   PT/INR No results for input(s): LABPROT, INR in the last 72 hours.  Studies/Results: No results found.  Anti-infectives: Anti-infectives (From admission, onward)    Start     Dose/Rate Route Frequency Ordered Stop   12/25/21 1600  clindamycin (CLEOCIN) IVPB 900 mg        900 mg 100 mL/hr over 30 Minutes Intravenous Every 8 hours 12/25/21 1443 12/26/21 0051   12/24/21 0951  clindamycin (CLEOCIN) IVPB 900 mg       See Hyperspace for full Linked Orders Report.   900 mg 100 mL/hr over 30 Minutes Intravenous 60 min pre-op 12/24/21 0951 12/25/21 0745   12/24/21 0951  gentamicin (GARAMYCIN) 440 mg in dextrose 5 % 100 mL IVPB       See Hyperspace for full Linked Orders Report.   5 mg/kg  88.5 kg (Adjusted) 111 mL/hr over 60  Minutes Intravenous 60 min pre-op 12/24/21 3403 12/25/21 0755       Assessment/Plan: Patient s/p colostomy reversal and repair of parastomal and incisional hernia with phasix mesh. Doing well overall.  Dc the IV  Tramadol 50-100 q6 PRN IS, OOB Ambulate HD good Soft diet Lovenox sq  SCDs H&H stable  Home tomorrow after not being on any IV pain meds   LOS: 4 days    [name] [name] 12/29/2021

## 2021-12-29 NOTE — Progress Notes (Signed)
Patient noted to have hematuria with clots in it , notified Dr. Constance Haw, patient had a foley for two days and for now will monitor patient output.

## 2021-12-29 NOTE — Care Management Important Message (Signed)
Important Message  Patient Details  Name: [name] [name] MRN: [id number] Date of Birth: 1966-09-06   Medicare Important Message Given:  Yes     Corey Harold 12/29/2021, 12:29 PM

## 2021-12-30 ENCOUNTER — Other Ambulatory Visit: Payer: Self-pay

## 2021-12-30 ENCOUNTER — Ambulatory Visit (INDEPENDENT_AMBULATORY_CARE_PROVIDER_SITE_OTHER): Payer: PPO

## 2021-12-30 DIAGNOSIS — Z0100 Encounter for examination of eyes and vision without abnormal findings: Secondary | ICD-10-CM | POA: Diagnosis not present

## 2021-12-30 DIAGNOSIS — Z789 Other specified health status: Secondary | ICD-10-CM | POA: Diagnosis not present

## 2021-12-30 DIAGNOSIS — Z0000 Encounter for general adult medical examination without abnormal findings: Secondary | ICD-10-CM

## 2021-12-30 MED ORDER — TRAMADOL HCL 50 MG PO TABS: 50.0000 mg | ORAL_TABLET | Freq: Four times a day (QID) | ORAL | 0 refills | Status: DC | PRN

## 2021-12-30 MED ORDER — ONDANSETRON HCL 4 MG PO TABS: 4.0000 mg | ORAL_TABLET | Freq: Four times a day (QID) | ORAL | 0 refills | Status: DC | PRN

## 2021-12-30 NOTE — Discharge Summary (Signed)
Physician Discharge Summary  Patient ID: [name] [name] MRN: [id number] DOB/AGE: May 11, 1966 56 y.o.  Admit date: 12/25/2021 Discharge date: 12/30/2021  Admission Diagnoses: Colostomy and ventral hernia  Discharge Diagnoses:  Principal Problem:   Colostomy in place [facility]) Active Problems:   Incisional hernia, without obstruction or gangrene   History of colostomy reversal Acute kidney injury with elevation in creatinine transiently   Discharged Condition: good  Hospital Course: [name] [name] is a 56 yo who had a colostomy after a rectal perforation. He also had a ventral hernia and parastomal hernia. He was reversed and had absorbable mesh placed for hernia repair. He was admitted and did well post operatively. He had return of bowel function and had his NG removed. He was started on a diet and was advanced. He was ambulating and had pain control post operatively. He did have some mild acute kidney injury likely from his bowel preparation and this resolved during his hospital stay with normalization of his creatinine.  Prior to discharge he was having Bms and was urinating. He did have a foley for 2 days and did pass a clot likely from the foley. The urine had cleared up prior to discharge.   Updated [facility] about the additional tramadol prescription of 50-100 mg every 6 hours PRN # 20 for him to use with his chronic prescription of tramadol 50 four times a day #120 he filled on 11/26/2021.   Consults: None  Significant Diagnostic Studies:   Lab Results  Component Value Date   NA 137 12/28/2021   K 3.5 12/28/2021   CO2 27 12/28/2021   GLUCOSE 102 (H) 12/28/2021   BUN 10 12/28/2021   CREATININE 0.77 12/28/2021   CALCIUM 8.0 (L) 12/28/2021   EGFR 96 06/01/2021   GFRNONAA >60 12/28/2021    Lab Results  Component Value Date   WBC 9.8 12/29/2021   HGB 11.5 (L) 12/29/2021   HCT 33.9 (L) 12/29/2021   MCV 87.4 12/29/2021   PLT 225 12/29/2021    Treatments: Colostomy reversal,  with small bowel resection, parastomal and incisional hernia repair with phasix absorbable mesh 12/25/2021  Discharge Exam: Blood pressure 133/79, pulse 96, temperature 98.6 F (37 C), resp. rate 18, height _0  (1.803 m), weight 113 kg, SpO2 92 %. General appearance: alert and no distress Resp: normal work of breathing GI: soft, appropriately tender, incisions c/d/I with no erythema and minimal SS drainage , seroma developing in RLQ area where hernia sac was located  Disposition: Discharge disposition: 01-Home or Self Care       Discharge Instructions     Call MD for:  difficulty breathing, headache or visual disturbances   Complete by: As directed    Call MD for:  extreme fatigue   Complete by: As directed    Call MD for:  persistant dizziness or light-headedness   Complete by: As directed    Call MD for:  persistant nausea and vomiting   Complete by: As directed    Call MD for:  redness, tenderness, or signs of infection (pain, swelling, redness, odor or green/yellow discharge around incision site)   Complete by: As directed    Call MD for:  severe uncontrolled pain   Complete by: As directed    Call MD for:  temperature >100.4   Complete by: As directed    Increase activity slowly   Complete by: As directed       Allergies as of 12/30/2021       Reactions  Codeine Shortness Of Breath, Swelling   Divalproex Sodium Diarrhea, Itching, Rash, Shortness Of Breath, Swelling   Erythromycin Hives, Itching, Rash, Swelling   Iodinated Contrast Media Swelling   Patient states he received "red Dye" 15-20 years ago and become red/flushed. No other symptoms. Patient did do 13 hour pre meds for CT 09/17/2021   Latex Itching, Rash   Morphine And Related Shortness Of Breath, Swelling   Sulfa Antibiotics Diarrhea, Itching, Rash, Shortness Of Breath, Swelling   Wheat Bran Hives, Itching, Rash, Shortness Of Breath, Swelling   Demerol [meperidine]    Body over heats and pouring sweat    Levofloxacin    Pt does not recall reaction    Niacin And Related Rash   Penicillins Swelling, Rash   REACTION: Rash and facial swelling at age 43        Medication List     STOP taking these medications    methylPREDNISolone 4 MG Tbpk tablet Commonly known as: MEDROL DOSEPAK       TAKE these medications    acetaminophen 500 MG tablet Commonly known as: TYLENOL Take 500 mg by mouth every 6 (six) hours as needed for moderate pain.   albuterol 108 (90 Base) MCG/ACT inhaler Commonly known as: VENTOLIN HFA Inhale 2 puffs into the lungs every 6 (six) hours as needed for wheezing or shortness of breath.   benzonatate 100 MG capsule Commonly known as: TESSALON Take 1 capsule (100 mg total) by mouth 2 (two) times daily as needed for cough.   busPIRone 7.5 MG tablet Commonly known as: BUSPAR Take 1 tablet (7.5 mg total) by mouth daily.   cyclobenzaprine 10 MG tablet Commonly known as: FLEXERIL Take 10 mg by mouth 2 (two) times daily.   DULoxetine 60 MG capsule Commonly known as: CYMBALTA Take 60 mg by mouth daily.   gabapentin 300 MG capsule Commonly known as: Neurontin Take 1 capsule (300 mg total) by mouth 3 (three) times daily.   hydrOXYzine 25 MG tablet Commonly known as: ATARAX BID IF NEEDED FOR ANXIETY   nicotine 21 mg/24hr patch Commonly known as: NICODERM CQ - dosed in mg/24 hours Place 21 mg onto the skin daily.   Norel AD 4-10-325 MG Tabs Generic drug: Chlorphen-PE-Acetaminophen Take 1 tablet by mouth 3 (three) times daily as needed (Nasal congestion).   ondansetron 4 MG tablet Commonly known as: ZOFRAN Take 1 tablet (4 mg total) by mouth every 6 (six) hours as needed for nausea.   risperidone 4 MG tablet Commonly known as: RISPERDAL TAKE (1) TABLET BY MOUTH AT BEDTIME.   telmisartan 20 MG tablet Commonly known as: MICARDIS TAKE (1) TABLET BY MOUTH ONCE A DAY.   traMADol 50 MG tablet Commonly known as: ULTRAM Take 50 mg by mouth 4 (four)  times daily. What changed: Another medication with the same name was added. Make sure you understand how and when to take each.   traMADol 50 MG tablet Commonly known as: ULTRAM Take 1-2 tablets (50-100 mg total) by mouth every 6 (six) hours as needed for severe pain. What changed: You were already taking a medication with the same name, and this prescription was added. Make sure you understand how and when to take each.        Follow up 01/06/2022 for staple removal with Dr. Constance Haw.   Future Appointments  Date Time Provider [place]  02/02/2022  1:00 PM Lindell Spar, MD [facility]  02/24/2022  9:30 AM Merian Capron, MD [facility] None  Signed: Virl Cagey 12/30/2021, 10:15 AM

## 2021-12-30 NOTE — Discharge Instructions (Addendum)
Can use ABD pads or Hygiene pads for drainage as needed. Reinforce dressing with tape.  Discharge Open Abdominal Surgery Instructions:  Common Complaints: Pain at the incision site is common. This will improve with time. Take your pain medications as described below. Some nausea is common and poor appetite. The main goal is to stay hydrated the first few days after surgery.   Diet/ Activity: Diet as tolerated. You have started and tolerated a diet in the hospital, and should continue to increase what you are able to eat.   You may not have a large appetite, but it is important to stay hydrated. Drink 64 ounces of water a day. Your appetite will return with time.  Keep a dry dressing in place over your staples daily or as needed. Some minor pink/ blood tinged drainage is expected. This will stop in a few days after surgery.  Shower per your regular routine daily.  Do not take hot showers. Take warm showers that are less than 10 minutes. Path the incision dry. Wear an abdominal binder daily with activity. You do not have to wear this while sleeping or sitting.  Rest and listen to your body, but do not remain in bed all day.  Walk everyday for at least 15-20 minutes. Deep cough and move around every 1-2 hours in the first few days after surgery.  Do not lift > 10 lbs, perform excessive bending, pushing, pulling, squatting for 6-8 weeks after surgery.  The activity restrictions and the abdominal binder are to prevent hernia formation at your incision while you are healing.  Do not place lotions or balms on your incision unless instructed to specifically by Dr. Henreitta Leber.   Pain Expectations and Narcotics: -After surgery you will have pain associated with your incisions and this is normal. The pain is muscular and nerve pain, and will get better with time. -You are encouraged and expected to take non narcotic medications like tylenol and ibuprofen (when able) to treat pain as multiple modalities can  aid with pain treatment. -Narcotics are only used when pain is severe or there is breakthrough pain. -You are not expected to have a pain score of 0 after surgery, as we cannot prevent pain. A pain score of 3-4 that allows you to be functional, move, walk, and tolerate some activity is the goal. The pain will continue to improve over the days after surgery and is dependent on your surgery. -Due to [place] law, we are only able to give a certain amount of pain medication to treat post operative pain, and we only give additional narcotics on a patient by patient basis.  -For most laparoscopic surgery, studies have shown that the majority of patients only need 10-15 narcotic pills, and for open surgeries most patients only need 15-20.   -Having appropriate expectations of pain and knowledge of pain management with non narcotics is important as we do not want anyone to become addicted to narcotic pain medication.  -Using ice packs in the first 48 hours and heating pads after 48 hours, wearing an abdominal binder (when recommended), and using over the counter medications are all ways to help with pain management.   -Simple acts like meditation and mindfulness practices after surgery can also help with pain control and research has proven the benefit of these practices.  Medication: Take tylenol and ibuprofen as needed for pain control, alternating every 4-6 hours.  Example:  Tylenol 1000mg  @ 6am, 12noon, 6pm, (Do not exceed 4000mg  of tylenol a  day). Ibuprofen 800mg  @ 9am, 3pm, 9pm, 3am (Do not exceed 3600mg  of ibuprofen a day).  Take Tramadol 50-100mg  for breakthrough pain every 4-6 hours.  Take Colace for constipation related to narcotic pain medication. If you do not have a bowel movement in 2 days, take Miralax over the counter.  Drink plenty of water to also prevent constipation.   Contact Information: If you have questions or concerns, please call our office, [phone], Monday- Thursday  8AM-5PM and Friday 8AM-12Noon.  If it is after hours or on the weekend, please call [facility], [phone], [phone], and ask to speak to the surgeon on call for Dr. [phone] at [facility].

## 2021-12-30 NOTE — Progress Notes (Signed)
Subjective:   [name] [name] is a 56 y.o. male who presents for an Initial Medicare Annual Wellness Visit. I connected with  Enos Aikins Haltiwanger on 12/30/21 by a audio enabled telemedicine application and verified that I am speaking with the correct person using two identifiers.  Patient Location: Home  Provider Location: Office/Clinic  I discussed the limitations of evaluation and management by telemedicine. The patient expressed understanding and agreed to proceed.  Review of Systems    Defer to PCP Cardiac Risk Factors include: male gender;advanced age (>85men, >77 women);smoking/ tobacco exposure;hypertension     Objective:    Today's Vitals   12/30/21 1407  PainSc: 6    There is no height or weight on file to calculate BMI.  Advanced Directives 12/30/2021 12/25/2021 12/23/2021 12/04/2021 10/21/2021 10/19/2021 04/06/2021  Does Patient Have a Medical Advance Directive? No No No No No No No  Would patient like information on creating a medical advance directive? Yes (ED - Information included in AVS) No - Patient declined No - Patient declined No - Patient declined No - Patient declined No - Patient declined No - Patient declined  Some encounter information is confidential and restricted. Go to Review Flowsheets activity to see all data.    Current Medications (verified) Outpatient Encounter Medications as of 12/30/2021  Medication Sig   acetaminophen (TYLENOL) 500 MG tablet Take 500 mg by mouth every 6 (six) hours as needed for moderate pain.   busPIRone (BUSPAR) 7.5 MG tablet Take 1 tablet (7.5 mg total) by mouth daily.   cyclobenzaprine (FLEXERIL) 10 MG tablet Take 10 mg by mouth 2 (two) times daily.   DULoxetine (CYMBALTA) 60 MG capsule Take 60 mg by mouth daily.   gabapentin (NEURONTIN) 300 MG capsule Take 1 capsule (300 mg total) by mouth 3 (three) times daily.   hydrOXYzine (ATARAX) 25 MG tablet BID IF NEEDED FOR ANXIETY   nicotine (NICODERM CQ - DOSED IN MG/24 HOURS) 21 mg/24hr  patch Place 21 mg onto the skin daily.   ondansetron (ZOFRAN) 4 MG tablet Take 1 tablet (4 mg total) by mouth every 6 (six) hours as needed for nausea.   risperidone (RISPERDAL) 4 MG tablet TAKE (1) TABLET BY MOUTH AT BEDTIME.   telmisartan (MICARDIS) 20 MG tablet TAKE (1) TABLET BY MOUTH ONCE A DAY.   traMADol (ULTRAM) 50 MG tablet Take 1-2 tablets (50-100 mg total) by mouth every 6 (six) hours as needed for severe pain.   albuterol (VENTOLIN HFA) 108 (90 Base) MCG/ACT inhaler Inhale 2 puffs into the lungs every 6 (six) hours as needed for wheezing or shortness of breath. (Patient not taking: Reported on 12/30/2021)   benzonatate (TESSALON) 100 MG capsule Take 1 capsule (100 mg total) by mouth 2 (two) times daily as needed for cough. (Patient not taking: Reported on 12/30/2021)   Chlorphen-PE-Acetaminophen (NOREL AD) 4-10-325 MG TABS Take 1 tablet by mouth 3 (three) times daily as needed (Nasal congestion). (Patient not taking: Reported on 12/30/2021)   traMADol (ULTRAM) 50 MG tablet Take 50 mg by mouth 4 (four) times daily. (Patient not taking: Reported on 12/30/2021)   [DISCONTINUED] carbamazepine (TEGRETOL) 100 MG chewable tablet Take one a day, generic   [DISCONTINUED] diphenhydrAMINE (BENADRYL) 50 MG tablet Take 1 tablet (50 mg total) by mouth as directed. Take 50 mg of benadryl 1 hour before your CT scan   [DISCONTINUED] acetaminophen (TYLENOL) tablet 1,000 mg    [DISCONTINUED] albuterol (VENTOLIN HFA) 108 (90 Base) MCG/ACT inhaler 2 puff    [  DISCONTINUED] benzonatate (TESSALON) capsule 100 mg    [DISCONTINUED] busPIRone (BUSPAR) tablet 7.5 mg    [DISCONTINUED] Chlorhexidine Gluconate Cloth 2 % PADS 6 each    [DISCONTINUED] cyclobenzaprine (FLEXERIL) tablet 10 mg    [DISCONTINUED] diphenhydrAMINE (BENADRYL) 12.5 MG/5ML elixir 12.5 mg    [DISCONTINUED] diphenhydrAMINE (BENADRYL) injection 12.5 mg    [DISCONTINUED] DULoxetine (CYMBALTA) DR capsule 60 mg    [DISCONTINUED] enoxaparin (LOVENOX)  injection 60 mg    [DISCONTINUED] feeding supplement (BOOST / RESOURCE BREEZE) liquid 1 Container    [DISCONTINUED] gabapentin (NEURONTIN) capsule 300 mg    [DISCONTINUED] hydrOXYzine (ATARAX) tablet 25 mg    [DISCONTINUED] ibuprofen (ADVIL) tablet 600 mg    [DISCONTINUED] irbesartan (AVAPRO) tablet 75 mg    [DISCONTINUED] menthol-cetylpyridinium (CEPACOL) lozenge 3 mg    [DISCONTINUED] metoprolol tartrate (LOPRESSOR) injection 5 mg    [DISCONTINUED] nicotine (NICODERM CQ - dosed in mg/24 hours) patch 21 mg    [DISCONTINUED] ondansetron (ZOFRAN) injection 4 mg    [DISCONTINUED] ondansetron (ZOFRAN) tablet 4 mg    [DISCONTINUED] risperiDONE (RISPERDAL) tablet 4 mg    [DISCONTINUED] simethicone (MYLICON) chewable tablet 40 mg    [DISCONTINUED] traMADol (ULTRAM) tablet 50-100 mg    No facility-administered encounter medications on file as of 12/30/2021.    Allergies (verified) Codeine, Divalproex sodium, Erythromycin, Iodinated contrast media, Latex, Morphine and related, Sulfa antibiotics, Wheat bran, Demerol [meperidine], Levofloxacin, Niacin and related, and Penicillins   History: Past Medical History:  Diagnosis Date   Anxiety    Bipolar 1 disorder (HCC)    Chronic fatigue    Chronic pain    Complete intestinal obstruction ([place])    Depression    Phreesia 12/03/2020   Fecal peritonitis ([place]) 08/03/2019   Fibromyalgia    Hernia of abdominal wall    Hyperlipidemia    Phreesia 12/03/2020   Hypertension    Ileus, postoperative ([place])    Perforated rectum ([place]) 08/03/2019   Sleep apnea    Past Surgical History:  Procedure Laterality Date   COLON RESECTION SIGMOID  08/03/2019   Procedure: COLON RESECTION SIGMOID;  Surgeon: Virl Cagey, MD;  Location: [facility];  Service: General;;   COLONOSCOPY WITH PROPOFOL N/A 10/21/2021   Procedure: COLONOSCOPY WITH PROPOFOL;  Surgeon: Rogene Houston, MD;  Location: [facility];  Service: Endoscopy;  Laterality: N/A;  955   COLOSTOMY  N/A 08/03/2019   Procedure: COLOSTOMY;  Surgeon: Virl Cagey, MD;  Location: [facility];  Service: General;  Laterality: N/A;   COLOSTOMY REVERSAL N/A 12/25/2021   Procedure: COLOSTOMY REVERSAL;  Surgeon: Virl Cagey, MD;  Location: [facility];  Service: General;  Laterality: N/A;   FLEXIBLE SIGMOIDOSCOPY N/A 08/03/2019   Procedure: FLEXIBLE SIGMOIDOSCOPY;  Surgeon: Daneil Dolin, MD;  Location[facility];  Service: Endoscopy;  Laterality: N/A;   INCISIONAL HERNIA REPAIR N/A 12/25/2021   Procedure: INCISIONAL HERNIA REPAIR WITH ABSORBABLE MESH;  Surgeon: Virl Cagey, MD;  Location: [facility];  Service: General;  Laterality: N/A;   KNEE ARTHROSCOPY Left 2006   miniscus tear   LAPAROTOMY N/A 08/03/2019   Procedure: EXPLORATORY LAPAROTOMY;  Surgeon: Virl Cagey, MD;  Location: [facility];  Service: General;  Laterality: N/A;   LYMPH GLAND EXCISION Left 1983   PARASTOMAL HERNIA REPAIR Left 12/25/2021   Procedure: PARASTOMAL HERNIA REPAIR WITH ABSORBABLE MESH;  Surgeon: Virl Cagey, MD;  Location: [facility];  Service: General;  Laterality: Left;   SMALL INTESTINE SURGERY N/A  Phreesia 12/03/2020   TONSILLECTOMY     Family History  Problem Relation Age of Onset   Diabetes Mother    Heart attack Father    Diabetes Maternal Grandmother    Colon cancer Neg Hx    Colon polyps Neg Hx    Social History   Socioeconomic History   Marital status: Married    Spouse name: [name]   Number of children: 0   Years of education: 12   Highest education level: Some college, no degree  Occupational History   Not on file  Tobacco Use   Smoking status: Every Day    Packs/day: 1.00    Years: 10.00    Pack years: 10.00    Types: Cigarettes   Smokeless tobacco: Never  Vaping Use   Vaping Use: Never used  Substance and Sexual Activity   Alcohol use: Not Currently    Comment: denied 10/02/19   Drug use: Not Currently    Types: Cocaine    Comment: last used in December 2020.     Sexual activity: Not Currently  Other Topics Concern   Not on file  Social History Narrative   Pt lives alone and is on disability. Counts his mother, sister and aunt as his social supports.    Social Determinants of Health   Financial Resource Strain: Low Risk    Difficulty of Paying Living Expenses: Not hard at all  Food Insecurity: No Food Insecurity   Worried About Programme researcher, broadcasting/film/video in the Last Year: Never true   Barista in the Last Year: Never true  Transportation Needs: Unmet Transportation Needs   Lack of Transportation (Medical): Not on file   Lack of Transportation (Non-Medical): Yes  Physical Activity: Inactive   Days of Exercise per Week: 0 days   Minutes of Exercise per Session: 0 min  Stress: No Stress Concern Present   Feeling of Stress : Only a little  Social Connections: Moderately Integrated   Frequency of Communication with Friends and Family: More than three times a week   Frequency of Social Gatherings with Friends and Family: Once a week   Attends Religious Services: 1 to 4 times per year   Active Member of [employer] or Organizations: No   Attends Engineer, structural: Never   Marital Status: Married    Tobacco Counseling Ready to quit: Not Answered Counseling given: Not Answered   Clinical Intake:  Pre-visit preparation completed: No  Pain : 0-10 Pain Score: 6  (Pt just release from hospital had surgery 4 days ago) Faces Pain Scale: Hurts even more Pain Type: Acute pain Pain Location: Abdomen Pain Orientation: Lower Pain Descriptors / Indicators: Jabbing, Tender Pain Onset: In the past 7 days Pain Frequency: Constant Pain Relieving Factors: pain meds due to surgery  Faces Pain Scale: Hurts even more Pain Relieving Factors: pain meds due to surgery  Nutritional Risks: None Diabetes: No  How often do you need to have someone help you when you read instructions, pamphlets, or other written materials from your doctor or  pharmacy?: 1 - Never What is the last grade level you completed in school?: 12th  Diabetic?no  Interpreter Needed?: No  Information entered by :: Cordelia Pen   Activities of Daily Living In your present state of health, do you have any difficulty performing the following activities: 12/30/2021 12/27/2021  Hearing? N N  Comment - -  Vision? N N  Difficulty concentrating or making decisions? N N  Walking or  climbing stairs? Y N  Dressing or bathing? N N  Doing errands, shopping? N [place] and eating ? N -  Using the Toilet? N -  In the past six months, have you accidently leaked urine? N -  Do you have problems with loss of bowel control? N -  Managing your Medications? N -  Managing your Finances? N -  Housekeeping or managing your Housekeeping? N -  Some encounter information is confidential and restricted. Go to Review Flowsheets activity to see all data.  Some recent data might be hidden    Patient Care Team: Lindell Spar, MD as PCP - General (Internal Medicine) Susy Frizzle, PTA as Physical Therapy Assistant (Physical Therapy) Lonna Cobb, PT (Inactive) as Physical Therapist (Physical Therapy)  Indicate any recent Medical Services you may have received from other than [facility] providers in the past year (date may be approximate).     Assessment:   This is a routine wellness examination for [employer].  Hearing/Vision screen No results found.  Dietary issues and exercise activities discussed: Current Exercise Habits: The patient does not participate in regular exercise at present, Exercise limited by: orthopedic condition(s);psychological condition(s);neurologic condition(s)   Goals Addressed   None   Depression Screen PHQ 2/9 Scores 12/08/2021 11/30/2021 09/28/2021 07/17/2021 05/27/2021 05/07/2021 03/04/2021  PHQ - 2 Score 0 0 0 1 1 0 0  PHQ- 9 Score - - - - - - -  Some encounter information is confidential and restricted. Go to Review  Flowsheets activity to see all data.    Fall Risk Fall Risk  12/30/2021 12/08/2021 11/30/2021 09/28/2021 09/15/2021  Falls in the past year? 0 0 0 0 0  Number falls in past yr: - 0 0 0 -  Injury with Fall? 0 0 0 0 -  Risk for fall due to : Orthopedic patient No Fall Risks No Fall Risks No Fall Risks -  Follow up Falls evaluation completed Falls evaluation completed Falls evaluation completed Falls evaluation completed Falls evaluation completed    [place]:  Any stairs in or around the home? Yes  If so, are there any without handrails? Yes  Home free of loose throw rugs in walkways, pet beds, electrical cords, etc? Yes  Adequate lighting in your home to reduce risk of falls? Yes   ASSISTIVE DEVICES UTILIZED TO PREVENT FALLS:  Life alert? No  Use of a cane, walker or w/c? No  Grab bars in the bathroom? No  Shower chair or bench in shower? No  Elevated toilet seat or a handicapped toilet? No     Cognitive Function:     6CIT Screen 12/30/2021  What Year? 0 points  What month? 0 points  What time? 0 points  Count back from 20 0 points  Months in reverse 0 points  Repeat phrase 0 points  Total Score 0    Immunizations Immunization History  Administered Date(s) Administered   Influenza-Unspecified 10/25/2020    TDAP status: Due, Education has been provided regarding the importance of this vaccine. Advised may receive this vaccine at local pharmacy or Health Dept. Aware to provide a copy of the vaccination record if obtained from local pharmacy or Health Dept. Verbalized acceptance and understanding.  Flu Vaccine status: Due, Education has been provided regarding the importance of this vaccine. Advised may receive this vaccine at local pharmacy or Health Dept. Aware to provide a copy of the vaccination record if  obtained from local pharmacy or Health Dept. Verbalized acceptance and understanding.  Pneumococcal vaccine status: Due, Education has  been provided regarding the importance of this vaccine. Advised may receive this vaccine at local pharmacy or Health Dept. Aware to provide a copy of the vaccination record if obtained from local pharmacy or Health Dept. Verbalized acceptance and understanding.  Covid-19 vaccine status: Information provided on how to obtain vaccines.   Qualifies for Shingles Vaccine? Yes   Zostavax completed No   Shingrix Completed?: No.    Education has been provided regarding the importance of this vaccine. Patient has been advised to call insurance company to determine out of pocket expense if they have not yet received this vaccine. Advised may also receive vaccine at local pharmacy or Health Dept. Verbalized acceptance and understanding.  Screening Tests Health Maintenance  Topic Date Due   COVID-19 Vaccine (1) Never done   Pneumococcal Vaccine 72-68 Years old (1 - PCV) Never done   Zoster Vaccines- Shingrix (1 of 2) Never done   INFLUENZA VACCINE  07/27/2021   TETANUS/TDAP  05/27/2022 (Originally 02/03/1985)   COLONOSCOPY (Pts 45-3yrs Insurance coverage will need to be confirmed)  10/22/2031   Hepatitis C Screening  Completed   HIV Screening  Completed   HPV VACCINES  Aged Out    Health Maintenance  Health Maintenance Due  Topic Date Due   COVID-19 Vaccine (1) Never done   Pneumococcal Vaccine 70-43 Years old (1 - PCV) Never done   Zoster Vaccines- Shingrix (1 of 2) Never done   INFLUENZA VACCINE  07/27/2021    Colorectal cancer screening: Type of screening: Colonoscopy. Completed 10/21/2021. Repeat every 10 years  Lung Cancer Screening: (Low Dose CT Chest recommended if Age 39-80 years, 30 pack-year currently smoking OR have quit w/in 15years.) does not qualify.   Lung Cancer Screening Referral: n/a  Additional Screening:  Hepatitis C Screening: does qualify; Completed 08/03/2007  Vision Screening: Recommended annual ophthalmology exams for early detection of glaucoma and other  disorders of the eye. Is the patient up to date with their annual eye exam?  No  Who is the provider or what is the name of the office in which the patient attends annual eye exams? [facility] If pt is not established with a provider, would they like to be referred to a provider to establish care? Yes .   Dental Screening: Recommended annual dental exams for proper oral hygiene  Community Resource Referral / Chronic Care Management: CRR required this visit?  Yes   CCM required this visit?  No      Plan:     I have personally reviewed and noted the following in the patients chart:   Medical and social history Use of alcohol, tobacco or illicit drugs  Current medications and supplements including opioid prescriptions. Patient is currently taking opioid prescriptions. Information provided to patient regarding non-opioid alternatives. Patient advised to discuss non-opioid treatment plan with their provider. Functional ability and status Nutritional status Physical activity Advanced directives List of other physicians Hospitalizations, surgeries, and ER visits in previous 12 months Vitals Screenings to include cognitive, depression, and falls Referrals and appointments  In addition, I have reviewed and discussed with patient certain preventive protocols, quality metrics, and best practice recommendations. A written personalized care plan for preventive services as well as general preventive health recommendations were provided to patient.     Earline [place], [place]   12/30/2021   Nurse Notes:  Mr. Andaya , Thank you  for taking time to come for your Medicare Wellness Visit. I appreciate your ongoing commitment to your health goals. Please review the following plan we discussed and let me know if I can assist you in the future.   These are the goals we discussed:  Goals   None     This is a list of the screening recommended for you and due dates:  Health Maintenance  Topic Date  Due   COVID-19 Vaccine (1) Never done   Pneumococcal Vaccination (1 - PCV) Never done   Zoster (Shingles) Vaccine (1 of 2) Never done   Flu Shot  07/27/2021   Tetanus Vaccine  05/27/2022*   Colon Cancer Screening  10/22/2031   Hepatitis C Screening: USPSTF Recommendation to screen - Ages 18-79 yo.  Completed   HIV Screening  Completed   HPV Vaccine  Aged Out  *Topic was postponed. The date shown is not the original due date.

## 2021-12-30 NOTE — Patient Instructions (Addendum)
[name] , Thank you for taking time to come for your Medicare Wellness Visit. I appreciate your ongoing commitment to your health goals. Please review the following plan we discussed and let me know if I can assist you in the future.   These are the goals we discussed:  Goals   None     This is a list of the screening recommended for you and due dates:  Health Maintenance  Topic Date Due   COVID-19 Vaccine (1) Never done   Pneumococcal Vaccination (1 - PCV) Never done   Zoster (Shingles) Vaccine (1 of 2) Never done   Flu Shot  07/27/2021   Tetanus Vaccine  05/27/2022*   Colon Cancer Screening  10/22/2031   Hepatitis C Screening: USPSTF Recommendation to screen - Ages 18-79 yo.  Completed   HIV Screening  Completed   HPV Vaccine  Aged Out  *Topic was postponed. The date shown is not the original due date.    Health Maintenance, Male Adopting a healthy lifestyle and getting preventive care are important in promoting health and wellness. Ask your health care provider about: The right schedule for you to have regular tests and exams. Things you can do on your own to prevent diseases and keep yourself healthy. What should I know about diet, weight, and exercise? Eat a healthy diet  Eat a diet that includes plenty of vegetables, fruits, low-fat dairy products, and lean protein. Do not eat a lot of foods that are high in solid fats, added sugars, or sodium. Maintain a healthy weight Body mass index (BMI) is a measurement that can be used to identify possible weight problems. It estimates body fat based on height and weight. Your health care provider can help determine your BMI and help you achieve or maintain a healthy weight. Get regular exercise Get regular exercise. This is one of the most important things you can do for your health. Most adults should: Exercise for at least 150 minutes each week. The exercise should increase your heart rate and make you sweat (moderate-intensity  exercise). Do strengthening exercises at least twice a week. This is in addition to the moderate-intensity exercise. Spend less time sitting. Even light physical activity can be beneficial. Watch cholesterol and blood lipids Have your blood tested for lipids and cholesterol at 56 years of age, then have this test every 5 years. You may need to have your cholesterol levels checked more often if: Your lipid or cholesterol levels are high. You are older than 56 years of age. You are at high risk for heart disease. What should I know about cancer screening? Many types of cancers can be detected early and may often be prevented. Depending on your health history and family history, you may need to have cancer screening at various ages. This may include screening for: Colorectal cancer. Prostate cancer. Skin cancer. Lung cancer. What should I know about heart disease, diabetes, and high blood pressure? Blood pressure and heart disease High blood pressure causes heart disease and increases the risk of stroke. This is more likely to develop in people who have high blood pressure readings or are overweight. Talk with your health care provider about your target blood pressure readings. Have your blood pressure checked: Every 3-5 years if you are 95-28 years of age. Every year if you are 33 years old or older. If you are between the ages of 60 and 105 and are a current or former smoker, ask your health care provider if  you should have a one-time screening for abdominal aortic aneurysm (AAA). Diabetes Have regular diabetes screenings. This checks your fasting blood sugar level. Have the screening done: Once every three years after age 44 if you are at a normal weight and have a low risk for diabetes. More often and at a younger age if you are overweight or have a high risk for diabetes. What should I know about preventing infection? Hepatitis B If you have a higher risk for hepatitis B, you should be  screened for this virus. Talk with your health care provider to find out if you are at risk for hepatitis B infection. Hepatitis C Blood testing is recommended for: Everyone born from [birth year] through 1965. Anyone with known risk factors for hepatitis C. Sexually transmitted infections (STIs) You should be screened each year for STIs, including gonorrhea and chlamydia, if: You are sexually active and are younger than 56 years of age. You are older than 56 years of age and your health care provider tells you that you are at risk for this type of infection. Your sexual activity has changed since you were last screened, and you are at increased risk for chlamydia or gonorrhea. Ask your health care provider if you are at risk. Ask your health care provider about whether you are at high risk for HIV. Your health care provider may recommend a prescription medicine to help prevent HIV infection. If you choose to take medicine to prevent HIV, you should first get tested for HIV. You should then be tested every 3 months for as long as you are taking the medicine. Follow these instructions at home: Alcohol use Do not drink alcohol if your health care provider tells you not to drink. If you drink alcohol: Limit how much you have to 0-2 drinks a day. Know how much alcohol is in your drink. In the [place] one drink equals one 12 oz bottle of beer (355 mL), one 5 oz glass of wine (148 mL), or one 1 oz glass of hard liquor (44 mL). Lifestyle Do not use any products that contain nicotine or tobacco. These products include cigarettes, chewing tobacco, and vaping devices, such as e-cigarettes. If you need help quitting, ask your health care provider. Do not use street drugs. Do not share needles. Ask your health care provider for help if you need support or information about quitting drugs. General instructions Schedule regular health, dental, and eye exams. Stay current with your vaccines. Tell your health care  provider if: You often feel depressed. You have ever been abused or do not feel safe at home. Summary Adopting a healthy lifestyle and getting preventive care are important in promoting health and wellness. Follow your health care provider's instructions about healthy diet, exercising, and getting tested or screened for diseases. Follow your health care provider's instructions on monitoring your cholesterol and blood pressure. This information is not intended to replace advice given to you by your health care provider. Make sure you discuss any questions you have with your health care provider. Document Revised: 05/04/2021 Document Reviewed: 05/04/2021 Elsevier Patient Education  2022 [employer].

## 2021-12-31 ENCOUNTER — Telehealth: Payer: Self-pay

## 2021-12-31 NOTE — Telephone Encounter (Signed)
Transition Care Management Unsuccessful Follow-up Telephone Call  Date of discharge and from where:  12/30/21 [facility]   Attempts:  1st Attempt  Reason for unsuccessful TCM follow-up call:  Unable to reach patient

## 2022-01-06 ENCOUNTER — Encounter: Payer: Self-pay | Admitting: General Surgery

## 2022-01-06 ENCOUNTER — Ambulatory Visit (INDEPENDENT_AMBULATORY_CARE_PROVIDER_SITE_OTHER): Payer: PPO | Admitting: General Surgery

## 2022-01-06 ENCOUNTER — Other Ambulatory Visit: Payer: Self-pay

## 2022-01-06 VITALS — BP 131/88 | HR 95 | Temp 98.7°F | Resp 18 | Ht 71.0 in | Wt 241.0 lb

## 2022-01-06 DIAGNOSIS — K432 Incisional hernia without obstruction or gangrene: Secondary | ICD-10-CM

## 2022-01-06 DIAGNOSIS — Z933 Colostomy status: Secondary | ICD-10-CM

## 2022-01-06 NOTE — Patient Instructions (Signed)
No heavy lifting > 10 lbs, excessive bending, pushing, pulling, or squatting for 6-8 weeks after surgery.  Steristrips will peel off in the next 5-7 days. You can remove them once they are peeling off. It is ok to shower. Pat the area dry.  Wear binder. Keep stoops regular and soft. Continue to avoid inflammatory foods like beef, pork.  Soft, low fiber diet is best immediately post op.

## 2022-01-06 NOTE — Progress Notes (Signed)
[facility]  Doing well and moving around good. Tolerating diet and having Bms. Says his pain is better than it has been in a long time.  BP 131/88    Pulse 95    Temp 98.7 F (37.1 C) (Temporal)    Resp 18    Ht 5\' 11"  (1.803 m)    Wt 241 lb (109.3 kg)    SpO2 95%    BMI 33.61 kg/m  Healing incisions and staples without erythema or drainage, removed, steri strips placed  No obvious hernia, some bruising in RLQ area but nothing concerning  Patient s/p colostomy reversal and phasix mesh placement for temporary abdominal closure, discussed that he will likely develop a midline hernia given the defect size and this was a absorbable mesh that will be fully absorbed by 12 months.  No heavy lifting > 10 lbs, excessive bending, pushing, pulling, or squatting for 6-8 weeks after surgery.  Steristrips will peel off in the next 5-7 days. You can remove them once they are peeling off. It is ok to shower. Pat the area dry.  Wear binder. Keep stoops regular and soft. Continue to avoid inflammatory foods like beef, pork.  Soft, low fiber diet is best immediately post op.   Curlene Labrum, MD [facility] [address] [place], [place] [address] [phone] (office)

## 2022-01-07 ENCOUNTER — Telehealth: Payer: Self-pay

## 2022-01-07 NOTE — Telephone Encounter (Signed)
° °  Telephone encounter was:  Unsuccessful.  01/07/2022 Name: [name] [name] MRN: [id number] DOB: 11/19/1966  Unsuccessful outbound call made today to assist with:  Transportation Needs   Outreach Attempt:  1st Attempt  A HIPAA compliant voice message was left requesting a return call.  Instructed patient to call back at [phone].    Lenard Forth Care Guide, Embedded Care Coordination [facility], Care Management  [phone] [address] [place], [place], [place] [address] Phone: [phone] Email: [name]

## 2022-01-07 NOTE — Telephone Encounter (Signed)
° °  Telephone encounter was:  Successful.  01/07/2022 Name: [name] [name] MRN: [id number] DOB: Jun 14, 1966  [name] [name] is a 56 y.o. year old male who is a primary care patient of Anabel Halon, MD . The community resource team was consulted for assistance with Transportation Needs   Care guide performed the following interventions: Patient provided with information about care guide support team and interviewed to confirm resource needs.Patient returned call and said he no longer needs transportation that he has a way to his appointment  Follow Up Plan:  No further follow up planned at this time. The patient has been provided with needed resources.    Lenard Forth Care Guide, Embedded Care Coordination [facility], Care Management  [phone] [address] [place], [place], [place] [address] Phone: [phone] Email: [name]

## 2022-01-08 ENCOUNTER — Other Ambulatory Visit: Payer: Self-pay

## 2022-01-08 ENCOUNTER — Ambulatory Visit (INDEPENDENT_AMBULATORY_CARE_PROVIDER_SITE_OTHER): Payer: PPO | Admitting: Nurse Practitioner

## 2022-01-08 ENCOUNTER — Encounter: Payer: Self-pay | Admitting: Nurse Practitioner

## 2022-01-08 VITALS — BP 122/83 | HR 122 | Ht 71.0 in | Wt 241.0 lb

## 2022-01-08 DIAGNOSIS — M25561 Pain in right knee: Secondary | ICD-10-CM | POA: Diagnosis not present

## 2022-01-08 DIAGNOSIS — Z09 Encounter for follow-up examination after completed treatment for conditions other than malignant neoplasm: Secondary | ICD-10-CM

## 2022-01-08 DIAGNOSIS — I10 Essential (primary) hypertension: Secondary | ICD-10-CM | POA: Diagnosis not present

## 2022-01-08 DIAGNOSIS — Z9889 Other specified postprocedural states: Secondary | ICD-10-CM | POA: Diagnosis not present

## 2022-01-08 DIAGNOSIS — G609 Hereditary and idiopathic neuropathy, unspecified: Secondary | ICD-10-CM | POA: Diagnosis not present

## 2022-01-08 MED ORDER — GABAPENTIN 300 MG PO CAPS: 300.0000 mg | ORAL_CAPSULE | Freq: Three times a day (TID) | ORAL | 3 refills | Status: DC

## 2022-01-08 NOTE — Assessment & Plan Note (Signed)
DASH diet and commitment to daily physical activity for a minimum of 30 minutes discussed and encouraged, as a part of hypertension management. The importance of attaining a healthy weight is also discussed.  BP/Weight 01/08/2022 01/06/2022 12/30/2021 12/25/2021 11/17/2021 10/21/2021 10/19/2021  Systolic BP 122 131 133 - 159 335 142  Diastolic BP 83 88 79 - 89 74 95  Wt. (Lbs) 241 241 249.12 - 239 - 230  BMI 33.61 33.61 - 34.75 33.33 - 32.08  Some encounter information is confidential and restricted. Go to Review Flowsheets activity to see all data.  well controlled on telmisartan.

## 2022-01-08 NOTE — Progress Notes (Signed)
° °  DYLIN BREEDEN     MRN: [id number]      DOB: [birth year]   HPI Mr. Bickley is here for follow up for hospital admission.   Pt had an ostomy reversal surgery 12/30.He saw the surgeon 1/11 for post op visit, surgeon states wound is healing well. He has been moving his bowel ok without problems. Has another visit with surgeon on Feb 9th. Has dull pain 7/10. Taking PRN pain medications.  Pain c/o right knee pain that started 4 months ago, he has been seeing othopedica for this.Pain is dull 8/10.   ROS Denies recent fever or chills. Denies sinus pressure, nasal congestion, ear pain or sore throat. Denies chest congestion, productive cough or wheezing. Denies chest pains, palpitations and leg swelling Denies abdominal pain, nausea, vomiting,diarrhea or constipation.   Denies dysuria, frequency, hesitancy or incontinence. Has chronic right knee joint pain, no swelling and limitation in mobility. Denies headaches, seizures, numbness, or tingling. Denies depression, anxiety or insomnia. Denies skin break down or rash.   PE  BP 122/83 (BP Location: Right Arm, Patient Position: Sitting, Cuff Size: Normal)    Pulse (!) 122    Ht 5\' 11"  (1.803 m)    Wt 241 lb (109.3 kg)    SpO2 94%    BMI 33.61 kg/m   Patient alert and oriented and in no cardiopulmonary distress.  HEENT: No facial asymmetry, EOMI,     Neck supple .  Chest: Clear to auscultation bilaterally.  CVS: S1, S2 no murmurs, no S3.Regular rate.  ABD: Soft non tender. Has steri stripes on surgical site with abdominal binder. Stomach is soft and tender, no sign of infection noted.   Ext: No edema  MS: Adequate ROM spine, shoulders, hips and knees. Right knee tenderness on palpation  Skin: Intact, no ulcerations or rash noted.  Psych: Good eye contact, normal affect. Memory intact not anxious or depressed appearing.  CNS: CN 2-12 intact, power,  normal throughout.no focal deficits noted.   Assessment & Plan

## 2022-01-08 NOTE — Assessment & Plan Note (Signed)
S/p colostomy reversal . He has had post up visit with surgeon . States he is doing well after surgery, taking PRN pain med. Has another f/u with surgeon in Feb.

## 2022-01-08 NOTE — Patient Instructions (Signed)

## 2022-01-08 NOTE — Assessment & Plan Note (Signed)
Colostomy reversal on 12/25/21.  doing well after surgery, moving  BM per rectum, tolerating diet well.  Signs of infection discussed with pt. steristripes on abdominal incision, no sign of infection noted. Abdomen soft and tender

## 2022-01-08 NOTE — Assessment & Plan Note (Signed)
Well controlled Gabapentin refilld

## 2022-01-08 NOTE — Assessment & Plan Note (Signed)
He was told he need total knee replacement but he want a second opinion, he would like to go to [facility].  Tylenol prn, flexeril prn, tamadol prn.

## 2022-01-13 ENCOUNTER — Other Ambulatory Visit (HOSPITAL_COMMUNITY): Payer: Self-pay | Admitting: Psychiatry

## 2022-01-13 ENCOUNTER — Other Ambulatory Visit: Payer: Self-pay | Admitting: General Surgery

## 2022-01-15 DIAGNOSIS — M25561 Pain in right knee: Secondary | ICD-10-CM | POA: Diagnosis not present

## 2022-01-18 ENCOUNTER — Other Ambulatory Visit (HOSPITAL_COMMUNITY): Payer: Self-pay | Admitting: Psychiatry

## 2022-01-18 DIAGNOSIS — F419 Anxiety disorder, unspecified: Secondary | ICD-10-CM

## 2022-01-20 ENCOUNTER — Ambulatory Visit (INDEPENDENT_AMBULATORY_CARE_PROVIDER_SITE_OTHER): Payer: PPO | Admitting: *Deleted

## 2022-01-20 ENCOUNTER — Other Ambulatory Visit: Payer: Self-pay

## 2022-01-20 ENCOUNTER — Ambulatory Visit (INDEPENDENT_AMBULATORY_CARE_PROVIDER_SITE_OTHER): Payer: PPO | Admitting: Internal Medicine

## 2022-01-20 ENCOUNTER — Encounter: Payer: Self-pay | Admitting: Internal Medicine

## 2022-01-20 DIAGNOSIS — Z2821 Immunization not carried out because of patient refusal: Secondary | ICD-10-CM

## 2022-01-20 DIAGNOSIS — J069 Acute upper respiratory infection, unspecified: Secondary | ICD-10-CM

## 2022-01-20 DIAGNOSIS — Z20822 Contact with and (suspected) exposure to covid-19: Secondary | ICD-10-CM

## 2022-01-20 LAB — POCT INFLUENZA A/B
Influenza A, POC: NEGATIVE
Influenza B, POC: NEGATIVE

## 2022-01-20 NOTE — Progress Notes (Signed)
Virtual Visit via Telephone Note   This visit type was conducted due to national recommendations for restrictions regarding the COVID-19 Pandemic (e.g. social distancing) in an effort to limit this patient's exposure and mitigate transmission in our community.  Due to his co-morbid illnesses, this patient is at least at moderate risk for complications without adequate follow up.  This format is felt to be most appropriate for this patient at this time.  The patient did not have access to video technology/had technical difficulties with video requiring transitioning to audio format only (telephone).  All issues noted in this document were discussed and addressed.  No physical exam could be performed with this format.  Evaluation Performed:  Follow-up visit  Date:  01/20/2022   ID:  [name] [name], DOB 10-Apr-1966, MRN [id number]  Patient Location: Home Provider Location: Office/Clinic  Participants: Patient Location of Patient: Home Location of Provider: Telehealth Consent was obtain for visit to be over via telehealth. I verified that I am speaking with the correct person using two identifiers.  PCP:  Lindell Spar, MD   Chief Complaint: Cough and nasal congestion  History of Present Illness:    [name] [name] is a 56 y.o. male who has a televisit for complaint of nasal congestion, cough and low-grade fever for the last 2 days.  He also reports fatigue and myalgias.  Denies any dyspnea or wheezing currently.  The patient does have symptoms concerning for COVID-19 infection (fever, chills, cough, or new shortness of breath).   Past Medical, Surgical, Social History, Allergies, and Medications have been Reviewed.  Past Medical History:  Diagnosis Date   Anxiety    Bipolar 1 disorder ([place])    Chronic fatigue    Chronic pain    Complete intestinal obstruction ([place])    Depression    Phreesia 12/03/2020   Fecal peritonitis ([place]) 08/03/2019   Fibromyalgia    Hernia of abdominal  wall    Hyperlipidemia    Phreesia 12/03/2020   Hypertension    Ileus, postoperative ([place])    Perforated rectum ([place]) 08/03/2019   Sleep apnea    Past Surgical History:  Procedure Laterality Date   COLON RESECTION SIGMOID  08/03/2019   Procedure: COLON RESECTION SIGMOID;  Surgeon: Virl Cagey, MD;  Location: [facility];  Service: General;;   COLONOSCOPY WITH PROPOFOL N/A 10/21/2021   Procedure: COLONOSCOPY WITH PROPOFOL;  Surgeon: Rogene Houston, MD;  Location: [facility];  Service: Endoscopy;  Laterality: N/A;  955   COLOSTOMY N/A 08/03/2019   Procedure: COLOSTOMY;  Surgeon: Virl Cagey, MD;  Location: [facility];  Service: General;  Laterality: N/A;   COLOSTOMY REVERSAL N/A 12/25/2021   Procedure: COLOSTOMY REVERSAL;  Surgeon: Virl Cagey, MD;  Location: [facility];  Service: General;  Laterality: N/A;   FLEXIBLE SIGMOIDOSCOPY N/A 08/03/2019   Procedure: FLEXIBLE SIGMOIDOSCOPY;  Surgeon: Daneil Dolin, MD;  Location: [facility];  Service: Endoscopy;  Laterality: N/A;   INCISIONAL HERNIA REPAIR N/A 12/25/2021   Procedure: INCISIONAL HERNIA REPAIR WITH ABSORBABLE MESH;  Surgeon: Virl Cagey, MD;  Location: [facility];  Service: General;  Laterality: N/A;   KNEE ARTHROSCOPY Left 2006   miniscus tear   LAPAROTOMY N/A 08/03/2019   Procedure: EXPLORATORY LAPAROTOMY;  Surgeon: Virl Cagey, MD;  Location: [facility];  Service: General;  Laterality: N/A;   LYMPH GLAND EXCISION Left 1983   PARASTOMAL HERNIA REPAIR Left 12/25/2021   Procedure: PARASTOMAL HERNIA REPAIR  WITH ABSORBABLE MESH;  Surgeon: Virl Cagey, MD;  Location: [facility];  Service: General;  Laterality: Left;   SMALL INTESTINE SURGERY N/A    Phreesia 12/03/2020   TONSILLECTOMY       Current Meds  Medication Sig   acetaminophen (TYLENOL) 500 MG tablet Take 500 mg by mouth every 6 (six) hours as needed for moderate pain.   busPIRone (BUSPAR) 7.5 MG tablet TAKE 1 TABLET BY MOUTH DAILY   cyclobenzaprine  (FLEXERIL) 10 MG tablet Take 10 mg by mouth 2 (two) times daily.   DULoxetine (CYMBALTA) 60 MG capsule Take 60 mg by mouth daily.   gabapentin (NEURONTIN) 300 MG capsule Take 1 capsule (300 mg total) by mouth 3 (three) times daily.   hydrOXYzine (VISTARIL) 25 MG capsule TAKE 1 TABLET TWICE DAILY AS NEEDED FOR ANXIETY.   nicotine (NICODERM CQ - DOSED IN MG/24 HOURS) 21 mg/24hr patch Place 21 mg onto the skin daily.   ondansetron (ZOFRAN) 4 MG tablet Take 1 tablet (4 mg total) by mouth every 6 (six) hours as needed for nausea.   risperidone (RISPERDAL) 4 MG tablet TAKE (1) TABLET BY MOUTH AT BEDTIME.   telmisartan (MICARDIS) 20 MG tablet TAKE (1) TABLET BY MOUTH ONCE A DAY.   traMADol (ULTRAM) 50 MG tablet Take 50 mg by mouth 4 (four) times daily.     Allergies:   Codeine, Divalproex sodium, Erythromycin, Iodinated contrast media, Latex, Morphine and related, Sulfa antibiotics, Wheat bran, Demerol [meperidine], Levofloxacin, Niacin and related, and Penicillins   ROS:   Please see the history of present illness.     All other systems reviewed and are negative.   Labs/Other Tests and Data Reviewed:    Recent Labs: 06/01/2021: ALT 16; TSH 1.470 12/28/2021: BUN 10; Creatinine, Ser 0.77; Magnesium 1.7; Potassium 3.5; Sodium 137 12/29/2021: Hemoglobin 11.5; Platelets 225   Recent Lipid Panel Lab Results  Component Value Date/Time   CHOL 180 06/01/2021 08:33 AM   TRIG 235 (H) 06/01/2021 08:33 AM   HDL 48 06/01/2021 08:33 AM   CHOLHDL 3.8 06/01/2021 08:33 AM   CHOLHDL 4.2 04/09/2021 06:38 AM   LDLCALC 92 06/01/2021 08:33 AM    Wt Readings from Last 3 Encounters:  01/08/22 241 lb (109.3 kg)  01/06/22 241 lb (109.3 kg)  12/30/21 249 lb 1.9 oz (113 kg)     ASSESSMENT & PLAN:    URTI Suspected COVID-19 infection Check rapid flu test and COVID RT-PCR Continue symptomatic treatment with Mucinex Advised to maintain adequate hydration If persistent symptoms despite negative viral testing,  will start antibiotic  Time:   Today, I have spent 9 minutes reviewing the chart, including problem list, medications, and with the patient with telehealth technology discussing the above problems.   Medication Adjustments/Labs and Tests Ordered: Current medicines are reviewed at length with the patient today.  Concerns regarding medicines are outlined above.   Tests Ordered: No orders of the defined types were placed in this encounter.   Medication Changes: No orders of the defined types were placed in this encounter.    Note: This dictation was prepared with Dragon dictation along with smaller phrase technology. Similar sounding words can be transcribed inadequately or may not be corrected upon review. Any transcriptional errors that result from this process are unintentional.      Disposition:  Follow up  Signed, Lindell Spar, MD  01/20/2022 3:08 PM     [facility]

## 2022-01-20 NOTE — Patient Instructions (Signed)
Please come to office for rapid flu and COVID test.

## 2022-01-22 LAB — SARS-COV-2, NAA 2 DAY TAT

## 2022-01-22 LAB — NOVEL CORONAVIRUS, NAA: SARS-CoV-2, NAA: NOT DETECTED

## 2022-02-02 ENCOUNTER — Encounter: Payer: PPO | Admitting: Internal Medicine

## 2022-02-04 ENCOUNTER — Encounter: Payer: Self-pay | Admitting: General Surgery

## 2022-02-04 ENCOUNTER — Other Ambulatory Visit: Payer: Self-pay

## 2022-02-04 ENCOUNTER — Ambulatory Visit (INDEPENDENT_AMBULATORY_CARE_PROVIDER_SITE_OTHER): Payer: PPO | Admitting: General Surgery

## 2022-02-04 VITALS — BP 126/87 | HR 83 | Temp 97.5°F | Resp 16 | Ht 71.0 in | Wt 248.0 lb

## 2022-02-04 DIAGNOSIS — K432 Incisional hernia without obstruction or gangrene: Secondary | ICD-10-CM

## 2022-02-04 NOTE — Progress Notes (Signed)
[facility]  Healing and having no issues. Regular Bms. Has not been lifting.  BP 126/87    Pulse 83    Temp (!) 97.5 F (36.4 C) (Other (Comment))    Resp 16    Ht 5\' 11"  (1.803 m)    Wt 248 lb (112.5 kg)    SpO2 97%    BMI 34.59 kg/m  Steri strip still in place, removed, incisions c/d/I without erythema or drainage, no signs of hernia, the LLQ seroma area is less obvious    Patient s/p colostomy reversal and hernia repair. Doing great.  No heavy lifting > 10 lbs, excessive bending, pushing, pulling, or squatting for 6-8 weeks after surgery.  Keep stools soft and regular.  Ok to not wear binder if not super active.   Future Appointments  Date Time Provider [place]  02/24/2022  9:30 AM Merian Capron, MD [facility] None  03/18/2022  2:15 PM Virl Cagey, MD [facility] None  05/06/2022 11:00 AM Lindell Spar, MD [facility]  12/31/2022  3:00 PM [facility] NURSE [facility]   Curlene Labrum, MD [facility] [address] [place], [place] [address] [phone] (office)

## 2022-02-04 NOTE — Patient Instructions (Signed)
No heavy lifting > 10 lbs, excessive bending, pushing, pulling, or squatting for 6-8 weeks after surgery.  Keep stools soft and regular.  Ok to not wear binder if not super active.

## 2022-02-08 ENCOUNTER — Other Ambulatory Visit: Payer: Self-pay

## 2022-02-08 ENCOUNTER — Ambulatory Visit (INDEPENDENT_AMBULATORY_CARE_PROVIDER_SITE_OTHER): Payer: PPO | Admitting: Internal Medicine

## 2022-02-08 ENCOUNTER — Encounter: Payer: Self-pay | Admitting: Internal Medicine

## 2022-02-08 DIAGNOSIS — Z2821 Immunization not carried out because of patient refusal: Secondary | ICD-10-CM | POA: Diagnosis not present

## 2022-02-08 DIAGNOSIS — G894 Chronic pain syndrome: Secondary | ICD-10-CM

## 2022-02-08 DIAGNOSIS — M1711 Unilateral primary osteoarthritis, right knee: Secondary | ICD-10-CM | POA: Diagnosis not present

## 2022-02-08 MED ORDER — MELOXICAM 7.5 MG PO TABS: 7.5000 mg | ORAL_TABLET | Freq: Every day | ORAL | 2 refills | Status: DC

## 2022-02-08 NOTE — Patient Instructions (Signed)
Please start taking meloxicam as prescribed.  Please continue to take tramadol as needed for moderate to severe pain.

## 2022-02-08 NOTE — Assessment & Plan Note (Signed)
On gabapentin, Cymbalta and tramadol as needed Followed by pain clinic

## 2022-02-08 NOTE — Assessment & Plan Note (Signed)
Chronic right knee pain Needs TKA, has had orthopedic surgeon eval -would avoid surgery for now as he recently had colostomy reversal surgery Added Mobic for pain and inflammation Continue tramadol for chronic pain, followed by Jorge Mandril, NP

## 2022-02-08 NOTE — Progress Notes (Signed)
Virtual Visit via Telephone Note   This visit type was conducted due to national recommendations for restrictions regarding the COVID-19 Pandemic (e.g. social distancing) in an effort to limit this patient's exposure and mitigate transmission in our community.  Due to his co-morbid illnesses, this patient is at least at moderate risk for complications without adequate follow up.  This format is felt to be most appropriate for this patient at this time.  The patient did not have access to video technology/had technical difficulties with video requiring transitioning to audio format only (telephone).  All issues noted in this document were discussed and addressed.  No physical exam could be performed with this format.  Evaluation Performed:  Follow-up visit  Date:  02/08/2022   ID:  [name] [name], DOB [birth year], MRN [id number]  Patient Location: Home Provider Location: Office/Clinic  Participants: Patient Location of Patient: Home Location of Provider: Telehealth Consent was obtain for visit to be over via telehealth. I verified that I am speaking with the correct person using two identifiers.  PCP:  Anabel Halon, MD   Chief Complaint: Knee pain and diffuse muscle aches  History of Present Illness:    [name] [name] is a 56 y.o. male who has a televisit for complaint of b/l knee pain and diffuse muscle aches.  He has been taking tramadol for chronic pain, followed by pain clinic.  He states that Tylenol helps him better than tramadol for his knee pain and muscle aches.  He denies any recent fever, chills, cough, night sweats or weight loss.  He has been doing well since the reversal of colostomy.  The patient does not have symptoms concerning for COVID-19 infection (fever, chills, cough, or new shortness of breath).   Past Medical, Surgical, Social History, Allergies, and Medications have been Reviewed.  Past Medical History:  Diagnosis Date   Anxiety    Bipolar 1 disorder (HCC)     Chronic fatigue    Chronic pain    Complete intestinal obstruction (HCC)    Depression    Phreesia 12/03/2020   Fecal peritonitis (HCC) 08/03/2019   Fibromyalgia    Hernia of abdominal wall    Hyperlipidemia    Phreesia 12/03/2020   Hypertension    Ileus, postoperative (HCC)    Perforated rectum (HCC) 08/03/2019   Sleep apnea    Past Surgical History:  Procedure Laterality Date   COLON RESECTION SIGMOID  08/03/2019   Procedure: COLON RESECTION SIGMOID;  Surgeon: Lucretia Roers, MD;  [facility];  Service: General;;   COLONOSCOPY WITH PROPOFOL N/A 10/21/2021   Procedure: COLONOSCOPY WITH PROPOFOL;  Surgeon: Malissa Hippo, MD;  [facility];  Service: Endoscopy;  Laterality: N/A;  955   COLOSTOMY N/A 08/03/2019   Procedure: COLOSTOMY;  Surgeon: Lucretia Roers, MD;  [facility];  Service: General;  Laterality: N/A;   COLOSTOMY REVERSAL N/A 12/25/2021   Procedure: COLOSTOMY REVERSAL;  Surgeon: Lucretia Roers, MD;  [facility];  Service: General;  Laterality: N/A;   FLEXIBLE SIGMOIDOSCOPY N/A 08/03/2019   Procedure: FLEXIBLE SIGMOIDOSCOPY;  Surgeon: Corbin Ade, MD;  [facility];  Service: Endoscopy;  Laterality: N/A;   INCISIONAL HERNIA REPAIR N/A 12/25/2021   Procedure: INCISIONAL HERNIA REPAIR WITH ABSORBABLE MESH;  Surgeon: Lucretia Roers, MD;  [facility];  Service: General;  Laterality: N/A;   KNEE ARTHROSCOPY Left 2006   miniscus tear   LAPAROTOMY N/A 08/03/2019  Procedure: EXPLORATORY LAPAROTOMY;  Surgeon: Lucretia Roers, MD;  [facility];  Service: General;  Laterality: N/A;   LYMPH GLAND EXCISION Left 1983   PARASTOMAL HERNIA REPAIR Left 12/25/2021   Procedure: PARASTOMAL HERNIA REPAIR WITH ABSORBABLE MESH;  Surgeon: Lucretia Roers, MD;  [facility];  Service: General;  Laterality: Left;   SMALL INTESTINE SURGERY N/A    Phreesia 12/03/2020   TONSILLECTOMY       Current Meds  Medication Sig    acetaminophen (TYLENOL) 500 MG tablet Take 500 mg by mouth every 6 (six) hours as needed for moderate pain.   busPIRone (BUSPAR) 7.5 MG tablet TAKE 1 TABLET BY MOUTH DAILY   cyclobenzaprine (FLEXERIL) 10 MG tablet Take 10 mg by mouth 2 (two) times daily.   DULoxetine (CYMBALTA) 60 MG capsule Take 60 mg by mouth daily.   gabapentin (NEURONTIN) 300 MG capsule Take 1 capsule (300 mg total) by mouth 3 (three) times daily.   hydrOXYzine (VISTARIL) 25 MG capsule TAKE 1 TABLET TWICE DAILY AS NEEDED FOR ANXIETY.   meloxicam (MOBIC) 7.5 MG tablet Take 1 tablet (7.5 mg total) by mouth daily.   nicotine (NICODERM CQ - DOSED IN MG/24 HOURS) 21 mg/24hr patch Place 21 mg onto the skin daily.   ondansetron (ZOFRAN) 4 MG tablet Take 1 tablet (4 mg total) by mouth every 6 (six) hours as needed for nausea.   risperidone (RISPERDAL) 4 MG tablet TAKE (1) TABLET BY MOUTH AT BEDTIME.   telmisartan (MICARDIS) 20 MG tablet TAKE (1) TABLET BY MOUTH ONCE A DAY.   traMADol (ULTRAM) 50 MG tablet Take 50 mg by mouth 4 (four) times daily.     Allergies:   Codeine, Divalproex sodium, Erythromycin, Iodinated contrast media, Latex, Morphine and related, Sulfa antibiotics, Wheat bran, Demerol [meperidine], Levofloxacin, Niacin and related, and Penicillins   ROS:   Please see the history of present illness.     All other systems reviewed and are negative.   Labs/Other Tests and Data Reviewed:    Recent Labs: 06/01/2021: ALT 16; TSH 1.470 12/28/2021: BUN 10; Creatinine, Ser 0.77; Magnesium 1.7; Potassium 3.5; Sodium 137 12/29/2021: Hemoglobin 11.5; Platelets 225   Recent Lipid Panel Lab Results  Component Value Date/Time   CHOL 180 06/01/2021 08:33 AM   TRIG 235 (H) 06/01/2021 08:33 AM   HDL 48 06/01/2021 08:33 AM   CHOLHDL 3.8 06/01/2021 08:33 AM   CHOLHDL 4.2 04/09/2021 06:38 AM   LDLCALC 92 06/01/2021 08:33 AM    Wt Readings from Last 3 Encounters:  02/04/22 248 lb (112.5 kg)  01/08/22 241 lb (109.3 kg)   01/06/22 241 lb (109.3 kg)     ASSESSMENT & PLAN:    Osteoarthritis of right knee Chronic right knee pain Needs TKA, has had orthopedic surgeon eval -would avoid surgery for now as he recently had colostomy reversal surgery Added Mobic for pain and inflammation Continue tramadol for chronic pain, followed by Jorge Mandril, NP  Chronic pain syndrome On gabapentin, Cymbalta and tramadol as needed Followed by pain clinic   Time:   Today, I have spent 15 minutes reviewing the chart, including problem list, medications, and with the patient with telehealth technology discussing the above problems.   Medication Adjustments/Labs and Tests Ordered: Current medicines are reviewed at length with the patient today.  Concerns regarding medicines are outlined above.   Tests Ordered: No orders of the defined types were placed in this encounter.   Medication Changes: Meds ordered this encounter  Medications   meloxicam (MOBIC) 7.5 MG tablet    Sig: Take 1 tablet (7.5 mg total) by mouth daily.    Dispense:  30 tablet    Refill:  2     Note: This dictation was prepared with Dragon dictation along with smaller phrase technology. Similar sounding words can be transcribed inadequately or may not be corrected upon review. Any transcriptional errors that result from this process are unintentional.      Disposition:  Follow up  Signed, Anabel Halon, MD  02/08/2022 9:05 AM     [facility]

## 2022-02-24 ENCOUNTER — Telehealth (INDEPENDENT_AMBULATORY_CARE_PROVIDER_SITE_OTHER): Payer: PPO | Admitting: Psychiatry

## 2022-02-24 ENCOUNTER — Encounter (HOSPITAL_COMMUNITY): Payer: Self-pay | Admitting: Psychiatry

## 2022-02-24 DIAGNOSIS — F3112 Bipolar disorder, current episode manic without psychotic features, moderate: Secondary | ICD-10-CM

## 2022-02-24 DIAGNOSIS — F419 Anxiety disorder, unspecified: Secondary | ICD-10-CM

## 2022-02-24 DIAGNOSIS — F5102 Adjustment insomnia: Secondary | ICD-10-CM | POA: Diagnosis not present

## 2022-02-24 MED ORDER — RISPERIDONE 4 MG PO TABS: ORAL_TABLET | ORAL | 1 refills | Status: DC

## 2022-02-24 MED ORDER — BUSPIRONE HCL 7.5 MG PO TABS: 7.5000 mg | ORAL_TABLET | Freq: Every day | ORAL | 1 refills | Status: DC

## 2022-02-24 NOTE — Progress Notes (Signed)
[facility] Followup visit   Patient Identification: [name] [name] MRN:  [id number] Date of Evaluation:  02/24/2022 Referral Source: [facility] Discharge Chief Complaint:  Follow up bipolar  Visit Diagnosis:    ICD-10-CM   1. Bipolar disorder, curr episode manic w/o psychotic features, moderate (HCC)  F31.12     2. Anxiety  F41.9     3. Adjustment insomnia  F51.02      Virtual Visit via Video Note  I connected with [name] [name] on 02/24/22 at  9:30 AM EST by a video enabled telemedicine application and verified that I am speaking with the correct person using two identifiers.  Location: Patient: home Provider: home office   I discussed the limitations of evaluation and management by telemedicine and the availability of in person appointments. The patient expressed understanding and agreed to proceed.     I discussed the assessment and treatment plan with the patient. The patient was provided an opportunity to ask questions and all were answered. The patient agreed with the plan and demonstrated an understanding of the instructions.   The patient was advised to call back or seek an in-person evaluation if the symptoms worsen or if the condition fails to improve as anticipated.  I provided 15 minutes of non-face-to-face time during this encounter.    History of Present Illness:  Patient initially referred from discharge during April 2021 admission Admitted with mania and psychosis, in past , non compliance and alcohol use  Continues to do well and mood wise managing without agitation Worries related to pending knee replacment surgery and hernia suregery recovery Remains sober   Takes risperdal at night, also on gaba Hydroxyzine helps with anxiety, also on buspar  Primary care started him on cymbalta for pain and anxiety  Aggravating factors; drug use per history , non compliance , knee pain and medical co morbidities Modifying factors; mother, wife Duration: adult life Severity mood  wise not worse  Past Psychiatric History: bipolar disorder  Previous Psychotropic Medications: Yes   Substance Abuse History in the last 12 months:  No.  Consequences of Substance Abuse: not gives clear history of using drugs or minimizes use  Past Medical History:  Past Medical History:  Diagnosis Date   Anxiety    Bipolar 1 disorder (HCC)    Chronic fatigue    Chronic pain    Complete intestinal obstruction (HCC)    Depression    Phreesia 12/03/2020   Fecal peritonitis (HCC) 08/03/2019   Fibromyalgia    Hernia of abdominal wall    Hyperlipidemia    Phreesia 12/03/2020   Hypertension    Ileus, postoperative (HCC)    Perforated rectum (HCC) 08/03/2019   Sleep apnea     Past Surgical History:  Procedure Laterality Date   COLON RESECTION SIGMOID  08/03/2019   Procedure: COLON RESECTION SIGMOID;  Surgeon: Lucretia Roers, MD;  [facility];  Service: General;;   COLONOSCOPY WITH PROPOFOL N/A 10/21/2021   Procedure: COLONOSCOPY WITH PROPOFOL;  Surgeon: Malissa Hippo, MD;  [facility];  Service: Endoscopy;  Laterality: N/A;  955   COLOSTOMY N/A 08/03/2019   Procedure: COLOSTOMY;  Surgeon: Lucretia Roers, MD;  [facility]: [facility];  Service: General;  Laterality: N/A;   COLOSTOMY REVERSAL N/A 12/25/2021   Procedure: COLOSTOMY REVERSAL;  Surgeon: Lucretia Roers, MD;  [facility]: [facility];  Service: General;  Laterality: N/A;   FLEXIBLE SIGMOIDOSCOPY N/A 08/03/2019   Procedure: FLEXIBLE SIGMOIDOSCOPY;  Surgeon: Corbin Ade,  MD;  [facility];  Service: Endoscopy;  Laterality: N/A;   INCISIONAL HERNIA REPAIR N/A 12/25/2021   Procedure: INCISIONAL HERNIA REPAIR WITH ABSORBABLE MESH;  Surgeon: Lucretia Roers, MD;  [facility]: [facility];  Service: General;  Laterality: N/A;   KNEE ARTHROSCOPY Left 2006   miniscus tear   LAPAROTOMY N/A 08/03/2019   Procedure: EXPLORATORY LAPAROTOMY;  Surgeon: Lucretia Roers, MD;  Location: [facility];  Service: General;   Laterality: N/A;   LYMPH GLAND EXCISION Left 1983   PARASTOMAL HERNIA REPAIR Left 12/25/2021   Procedure: PARASTOMAL HERNIA REPAIR WITH ABSORBABLE MESH;  Surgeon: Lucretia Roers, MD;  Location: [facility];  Service: General;  Laterality: Left;   SMALL INTESTINE SURGERY N/A    Phreesia 12/03/2020   TONSILLECTOMY      Family Psychiatric History: denies  Family History:  Family History  Problem Relation Age of Onset   Diabetes Mother    Heart attack Father    Diabetes Maternal Grandmother    Colon cancer Neg Hx    Colon polyps Neg Hx     Social History:   Social History   Socioeconomic History   Marital status: Married    Spouse name: [name]   Number of children: 0   Years of education: 12   Highest education level: Some college, no degree  Occupational History   Not on file  Tobacco Use   Smoking status: Every Day    Packs/day: 1.00    Years: 10.00    Pack years: 10.00    Types: Cigarettes   Smokeless tobacco: Never  Vaping Use   Vaping Use: Never used  Substance and Sexual Activity   Alcohol use: Not Currently    Comment: denied 10/02/19   Drug use: Not Currently    Types: Cocaine    Comment: last used in December 2020.    Sexual activity: Not Currently  Other Topics Concern   Not on file  Social History Narrative   Pt lives alone and is on disability. Counts his mother, sister and aunt as his social supports.    Social Determinants of Health   Financial Resource Strain: Low Risk    Difficulty of Paying Living Expenses: Not hard at all  Food Insecurity: No Food Insecurity   Worried About Programme researcher, broadcasting/film/video in the Last Year: Never true   Barista in the Last Year: Never true  Transportation Needs: Unmet Transportation Needs   Lack of Transportation (Medical): Not on file   Lack of Transportation (Non-Medical): Yes  Physical Activity: Inactive   Days of Exercise per Week: 0 days   Minutes of Exercise per Session: 0 min  Stress: No Stress Concern  Present   Feeling of Stress : Only a little  Social Connections: Moderately Integrated   Frequency of Communication with Friends and Family: More than three times a week   Frequency of Social Gatherings with Friends and Family: Once a week   Attends Religious Services: 1 to 4 times per year   Active Member of [employer] or Organizations: No   Attends Banker Meetings: Never   Marital Status: Married     Allergies:   Allergies  Allergen Reactions   Codeine Shortness Of Breath and Swelling   Divalproex Sodium Diarrhea, Itching, Rash, Shortness Of Breath and Swelling   Erythromycin Hives, Itching, Rash and Swelling   Iodinated Contrast Media Swelling    Patient states he received "red Dye" 15-20 years ago  and become red/flushed. No other symptoms. Patient did do 13 hour pre meds for CT 09/17/2021   Latex Itching and Rash   Morphine And Related Shortness Of Breath and Swelling   Sulfa Antibiotics Diarrhea, Itching, Rash, Shortness Of Breath and Swelling   Wheat Bran Hives, Itching, Rash, Shortness Of Breath and Swelling   Demerol [Meperidine]     Body over heats and pouring sweat   Levofloxacin     Pt does not recall reaction    Niacin And Related Rash   Penicillins Swelling and Rash    REACTION: Rash and facial swelling at age 59    Metabolic Disorder Labs: Lab Results  Component Value Date   HGBA1C 5.6 12/23/2021   MPG 114.02 12/23/2021   MPG 108.28 04/07/2021   No results found for: PROLACTIN Lab Results  Component Value Date   CHOL 180 06/01/2021   TRIG 235 (H) 06/01/2021   HDL 48 06/01/2021   CHOLHDL 3.8 06/01/2021   VLDL 32 04/09/2021   LDLCALC 92 06/01/2021   LDLCALC 135 (H) 04/09/2021   Lab Results  Component Value Date   TSH 1.470 06/01/2021    Therapeutic Level Labs: Lab Results  Component Value Date   LITHIUM <0.06 (L) 09/20/2019   Lab Results  Component Value Date   CBMZ 6.7 04/19/2021   No results found for: VALPROATE  Current  Medications: Current Outpatient Medications  Medication Sig Dispense Refill   acetaminophen (TYLENOL) 500 MG tablet Take 500 mg by mouth every 6 (six) hours as needed for moderate pain.     busPIRone (BUSPAR) 7.5 MG tablet Take 1 tablet (7.5 mg total) by mouth daily. 30 tablet 1   cyclobenzaprine (FLEXERIL) 10 MG tablet Take 10 mg by mouth 2 (two) times daily.     DULoxetine (CYMBALTA) 60 MG capsule Take 60 mg by mouth daily.     gabapentin (NEURONTIN) 300 MG capsule Take 1 capsule (300 mg total) by mouth 3 (three) times daily. 90 capsule 3   hydrOXYzine (VISTARIL) 25 MG capsule TAKE 1 TABLET TWICE DAILY AS NEEDED FOR ANXIETY. 60 capsule 1   meloxicam (MOBIC) 7.5 MG tablet Take 1 tablet (7.5 mg total) by mouth daily. 30 tablet 2   nicotine (NICODERM CQ - DOSED IN MG/24 HOURS) 21 mg/24hr patch Place 21 mg onto the skin daily.     ondansetron (ZOFRAN) 4 MG tablet Take 1 tablet (4 mg total) by mouth every 6 (six) hours as needed for nausea. 20 tablet 0   risperidone (RISPERDAL) 4 MG tablet TAKE (1) TABLET BY MOUTH AT BEDTIME. 30 tablet 1   telmisartan (MICARDIS) 20 MG tablet TAKE (1) TABLET BY MOUTH ONCE A DAY. 90 tablet 0   traMADol (ULTRAM) 50 MG tablet Take 50 mg by mouth 4 (four) times daily.     No current facility-administered medications for this visit.      Psychiatric Specialty Exam: Review of Systems  Cardiovascular:  Negative for chest pain.  Psychiatric/Behavioral:  Negative for agitation, hallucinations and self-injury.    There were no vitals taken for this visit.There is no height or weight on file to calculate BMI.  General Appearance: casual  Eye Contact: fair  Speech:  more clear  Volume:  Normal  Mood:  Euthymic  Affectfair  Thought Process:  Coherent  Orientation:  Full (Time, Place, and Person)  Thought Content:  Rumination  Suicidal Thoughts:  No  Homicidal Thoughts:  No  Memory:  Immediate;   Fair Recent;  Fair  Judgement:  Other:  limited  Insight:   Shallow  Psychomotor Activity:  Normal  Concentration:  Concentration: Fair and Attention Span: Fair  Recall:  FiservFair  Fund of Knowledge:Fair  Language: Fair  Akathisia:  No  Handed:    AIMS (if indicated):no involuntary movements noteiceable  Assets:  Desire for Improvement  ADL's:  Intact  Cognition: WNL  Sleep:  Fair   Screenings: AIMS    Flowsheet Row [facility]  AIMS Total Score 0      AUDIT    Flowsheet Row [facility]  Alcohol Use Disorder Identification Test Final Score (AUDIT) 0      GAD-7    Flowsheet Row Counselor [facility]  Total GAD-7 Score 4      PHQ2-9    Flowsheet Row [facility]  PHQ-2 Total Score 0 0 0 0 0      Flowsheet Row [facility]) [facility] Pre-Ad[facility] Testing 60 [facility]-Ad[facility] Testing 60 [facility]  C-SSRS RISK CATEGORY No Risk No Risk No Risk       Assessment and Plan: as follows  Prior documentation reviewed  Bipolar current episode manic with psychosis: doing fair, not manic, continue risperdal 4mg . No worsening of side effects  Vistariil helps with anxiety and restlessness  Now off tegretol as well Avoid klonopine  Adjustment insomnia  : doing fair   Anxiety disorder unspecified ; worries related to surgery, continue buspar, cymbalta, gabapentin also on vistaril. Provided supportive therpay  Meds renewed  Fu 2 - 585m  or earlier if needed   Thresa RossNadeem Jenica Costilow, MD 3/1/20239:45 AM

## 2022-02-26 ENCOUNTER — Other Ambulatory Visit: Payer: Self-pay | Admitting: Internal Medicine

## 2022-02-26 DIAGNOSIS — I10 Essential (primary) hypertension: Secondary | ICD-10-CM

## 2022-03-01 DIAGNOSIS — F419 Anxiety disorder, unspecified: Secondary | ICD-10-CM | POA: Diagnosis not present

## 2022-03-01 DIAGNOSIS — I10 Essential (primary) hypertension: Secondary | ICD-10-CM | POA: Diagnosis not present

## 2022-03-01 DIAGNOSIS — M797 Fibromyalgia: Secondary | ICD-10-CM | POA: Diagnosis not present

## 2022-03-01 DIAGNOSIS — M25561 Pain in right knee: Secondary | ICD-10-CM | POA: Diagnosis not present

## 2022-03-01 DIAGNOSIS — G894 Chronic pain syndrome: Secondary | ICD-10-CM | POA: Diagnosis not present

## 2022-03-04 ENCOUNTER — Encounter: Payer: PPO | Admitting: General Surgery

## 2022-03-16 ENCOUNTER — Ambulatory Visit (INDEPENDENT_AMBULATORY_CARE_PROVIDER_SITE_OTHER): Payer: PPO | Admitting: General Surgery

## 2022-03-16 ENCOUNTER — Other Ambulatory Visit: Payer: Self-pay

## 2022-03-16 ENCOUNTER — Encounter: Payer: Self-pay | Admitting: General Surgery

## 2022-03-16 VITALS — BP 154/103 | HR 105 | Temp 97.6°F | Resp 16 | Ht 71.0 in | Wt 248.0 lb

## 2022-03-16 DIAGNOSIS — K432 Incisional hernia without obstruction or gangrene: Secondary | ICD-10-CM

## 2022-03-16 DIAGNOSIS — Z933 Colostomy status: Secondary | ICD-10-CM

## 2022-03-16 NOTE — Patient Instructions (Signed)
Diet and activity as tolerated. ?Can lift more everyday. Go slow.  ?Continue with your scheduled colonoscopies. ?Call if you develop a hernia and need a more permanent repair.  ?

## 2022-03-17 NOTE — Progress Notes (Signed)
[facility] ? ?Looks great. No issues. Having Bms. ? ?BP (!) 154/103   Pulse (!) 105   Temp 97.6 ?F (36.4 ?C) (Other (Comment))   Resp 16   Ht 5\' 11"  (1.803 m)   Wt 248 lb (112.5 kg)   SpO2 94%   BMI 34.59 kg/m?  ?Midline healed, no hernia noted, ostomy site healed, no hernia ? ?Patient s/p colostomy reversal and hernia repair with phasix. This mesh is not permanent and he will likely form another hernia. He is aware and will call in the future with issues. ? ?Diet and activity as tolerated. ?Can lift more everyday. Go slow.  ?Continue with your scheduled colonoscopies. ?Call if you develop a hernia and need a more permanent repair.  ? ?PRN Follow up ? ? , MD ?[facility] ?[address] ?[place], [place] ?[phone] (office) ? ?

## 2022-03-18 ENCOUNTER — Encounter: Payer: PPO | Admitting: General Surgery

## 2022-03-25 ENCOUNTER — Other Ambulatory Visit (HOSPITAL_COMMUNITY): Payer: Self-pay | Admitting: Psychiatry

## 2022-03-25 DIAGNOSIS — F419 Anxiety disorder, unspecified: Secondary | ICD-10-CM

## 2022-03-31 ENCOUNTER — Telehealth: Payer: Self-pay | Admitting: Internal Medicine

## 2022-03-31 ENCOUNTER — Encounter: Payer: Self-pay | Admitting: Internal Medicine

## 2022-03-31 ENCOUNTER — Ambulatory Visit (INDEPENDENT_AMBULATORY_CARE_PROVIDER_SITE_OTHER): Payer: PPO | Admitting: Internal Medicine

## 2022-03-31 DIAGNOSIS — K047 Periapical abscess without sinus: Secondary | ICD-10-CM

## 2022-03-31 MED ORDER — AMOXICILLIN-POT CLAVULANATE 875-125 MG PO TABS: 1.0000 | ORAL_TABLET | Freq: Two times a day (BID) | ORAL | 0 refills | Status: DC

## 2022-03-31 NOTE — Telephone Encounter (Signed)
Appt scheduled pt aware 

## 2022-03-31 NOTE — Telephone Encounter (Signed)
Patient called in regard to gum infection . ? ?Patient has infection in roots / gums . Oral surgery has already prescribed 10 day antibiotics and patient has finished the course . ? ?Infection still present  ? ?Dentist  does not want to prescribe another antibiotic. ? ?Patient has been running fever 100-101  ? ?Wants a call back in regard, wants to know what will help to keep fever down. Patient has been taking tylenol . ? ? ?

## 2022-03-31 NOTE — Telephone Encounter (Signed)
Please schedule virtual appt with patel  ?

## 2022-03-31 NOTE — Patient Instructions (Signed)
Please start taking Augmentin as prescribed. ? ?If you have recurrent fever with chills, you need to go to ER for blood culture. ?

## 2022-03-31 NOTE — Progress Notes (Signed)
?  ? ?Virtual Visit via Telephone Note  ? ?This visit type was conducted due to national recommendations for restrictions regarding the COVID-19 Pandemic (e.g. social distancing) in an effort to limit this patient's exposure and mitigate transmission in our community.  Due to his co-morbid illnesses, this patient is at least at moderate risk for complications without adequate follow up.  This format is felt to be most appropriate for this patient at this time.  The patient did not have access to video technology/had technical difficulties with video requiring transitioning to audio format only (telephone).  All issues noted in this document were discussed and addressed.  No physical exam could be performed with this format. ? ?Evaluation Performed:  Follow-up visit ? ?Date:  03/31/2022  ? ?ID:  [name] [name], DOB 01/22/1966, MRN [id number] ? ?Patient Location: Home ?Provider Location: Office/Clinic ? ?Participants: Patient ?Location of Patient: Home ?Location of Provider: Telehealth ?Consent was obtain for visit to be over via telehealth. ?I verified that I am speaking with the correct person using two identifiers. ? ?PCP:  Anabel HalonPatel, Grant Swager K, MD  ? ?Chief Complaint: Dental pain and fever ? ?History of Present Illness:   ? ?[name] [name] is a 56 y.o. male who has a televisit for complaint of dental pain and fever for about 2 weeks, after his dental extraction procedure.  He states that he had clindamycin after the dental procedure, which was controlling his fever.  But he has recurrent fever episodes since she has run out of his clindamycin about 3 days ago.  He currently denies any dental bleeding.  He has been taking Tylenol to relieve fever. ? ?The patient does not have symptoms concerning for COVID-19 infection (fever, chills, cough, or new shortness of breath).  ? ?Past Medical, Surgical, Social History, Allergies, and Medications have been Reviewed. ? ?Past Medical History:  ?Diagnosis Date  ? Anxiety   ? Bipolar 1  disorder (HCC)   ? Chronic fatigue   ? Chronic pain   ? Complete intestinal obstruction (HCC)   ? Depression   ? Phreesia 12/03/2020  ? Fecal peritonitis (HCC) 08/03/2019  ? Fibromyalgia   ? Hernia of abdominal wall   ? Hyperlipidemia   ? Phreesia 12/03/2020  ? Hypertension   ? Ileus, postoperative (HCC)   ? Perforated rectum (HCC) 08/03/2019  ? Sleep apnea   ? ?Past Surgical History:  ?Procedure Laterality Date  ? COLON RESECTION SIGMOID  08/03/2019  ? Procedure: COLON RESECTION SIGMOID;  Surgeon: Lucretia RoersBridges, Lindsay C, MD;  Location: [facility];  Service: General;;  ? COLONOSCOPY WITH PROPOFOL N/A 10/21/2021  ? Procedure: COLONOSCOPY WITH PROPOFOL;  Surgeon: Malissa Hippoehman, Najeeb U, MD;  [facility];  Service: Endoscopy;  Laterality: N/A;  955  ? COLOSTOMY N/A 08/03/2019  ? Procedure: COLOSTOMY;  Surgeon: Lucretia RoersBridges, Lindsay C, MD;  Location: [facility];  Service: General;  Laterality: N/A;  ? COLOSTOMY REVERSAL N/A 12/25/2021  ? Procedure: COLOSTOMY REVERSAL;  Surgeon: Lucretia RoersBridges, Lindsay C, MD;  Location: [facility];  Service: General;  Laterality: N/A;  ? FLEXIBLE SIGMOIDOSCOPY N/A 08/03/2019  ? Procedure: FLEXIBLE SIGMOIDOSCOPY;  Surgeon: Corbin Adeourk, Robert M, MD;  [facility];  Service: Endoscopy;  Laterality: N/A;  ? INCISIONAL HERNIA REPAIR N/A 12/25/2021  ? Procedure: INCISIONAL HERNIA REPAIR WITH ABSORBABLE MESH;  Surgeon: Lucretia RoersBridges, Lindsay C, MD;  Location: [facility];  Service: General;  Laterality: N/A;  ? KNEE ARTHROSCOPY Left 2006  ? miniscus tear  ? LAPAROTOMY N/A 08/03/2019  ?  Procedure: EXPLORATORY LAPAROTOMY;  Surgeon: Lucretia Roers, MD;  Location: [facility];  Service: General;  Laterality: N/A;  ? LYMPH GLAND EXCISION Left 1983  ? PARASTOMAL HERNIA REPAIR Left 12/25/2021  ? Procedure: PARASTOMAL HERNIA REPAIR WITH ABSORBABLE MESH;  Surgeon: Lucretia Roers, MD;  Location: [facility];  Service: General;  Laterality: Left;  ? SMALL INTESTINE SURGERY N/A   ? Phreesia 12/03/2020  ? TONSILLECTOMY    ?  ? ?Current Meds   ?Medication Sig  ? acetaminophen (TYLENOL) 500 MG tablet Take 500 mg by mouth every 6 (six) hours as needed for moderate pain.  ? amoxicillin-clavulanate (AUGMENTIN) 875-125 MG tablet Take 1 tablet by mouth 2 (two) times daily.  ? busPIRone (BUSPAR) 7.5 MG tablet Take 1 tablet (7.5 mg total) by mouth daily.  ? cyclobenzaprine (FLEXERIL) 10 MG tablet Take 10 mg by mouth 2 (two) times daily.  ? DULoxetine (CYMBALTA) 60 MG capsule Take 60 mg by mouth daily.  ? hydrOXYzine (VISTARIL) 25 MG capsule TAKE 1 CAPSULE TWICE DAILY AS NEEDED FOR ANXIETY.  ? meloxicam (MOBIC) 7.5 MG tablet Take 1 tablet (7.5 mg total) by mouth daily.  ? nicotine (NICODERM CQ - DOSED IN MG/24 HOURS) 21 mg/24hr patch Place 21 mg onto the skin daily.  ? ondansetron (ZOFRAN) 4 MG tablet Take 1 tablet (4 mg total) by mouth every 6 (six) hours as needed for nausea.  ? pregabalin (LYRICA) 75 MG capsule Take 75 mg by mouth 3 (three) times daily.  ? risperidone (RISPERDAL) 4 MG tablet TAKE (1) TABLET BY MOUTH AT BEDTIME.  ? telmisartan (MICARDIS) 20 MG tablet TAKE (1) TABLET BY MOUTH ONCE A DAY.  ? traMADol (ULTRAM) 50 MG tablet Take 50 mg by mouth 4 (four) times daily.  ?  ? ?Allergies:   Codeine, Divalproex sodium, Erythromycin, Iodinated contrast media, Latex, Morphine and related, Sulfa antibiotics, Wheat bran, Demerol [meperidine], Levofloxacin, Niacin and related, and Penicillins  ? ?ROS:   ?Please see the history of present illness.    ? ?All other systems reviewed and are negative. ? ? ?Labs/Other Tests and Data Reviewed:   ? ?Recent Labs: ?06/01/2021: ALT 16; TSH 1.470 ?12/28/2021: BUN 10; Creatinine, Ser 0.77; Magnesium 1.7; Potassium 3.5; Sodium 137 ?12/29/2021: Hemoglobin 11.5; Platelets 225  ? ?Recent Lipid Panel ?Lab Results  ?Component Value Date/Time  ? CHOL 180 06/01/2021 08:33 AM  ? TRIG 235 (H) 06/01/2021 08:33 AM  ? HDL 48 06/01/2021 08:33 AM  ? CHOLHDL 3.8 06/01/2021 08:33 AM  ? CHOLHDL 4.2 04/09/2021 06:38 AM  ? LDLCALC 92 06/01/2021  08:33 AM  ? ? ?Wt Readings from Last 3 Encounters:  ?03/16/22 248 lb (112.5 kg)  ?02/04/22 248 lb (112.5 kg)  ?01/08/22 241 lb (109.3 kg)  ?  ? ?ASSESSMENT & PLAN:   ? ?Dental infection ?Started empiric Augmentin for now -chart shows penicillin allergy, but he states that he is only allergic to basic penicillin G and has tolerated other penicillin based antibiotic ?Advised patient to go to ER if he has recurrent fever despite taking antibiotic -May need blood cultures to rule out sepsis secondary to bacteremia from dental infection ?Tylenol as needed for pain and fever ?Continue to follow-up with oral surgeon ? ?Time:   ?Today, I have spent 15 minutes reviewing the chart, including problem list, medications, and with the patient with telehealth technology discussing the above problems. ? ? ?Medication Adjustments/Labs and Tests Ordered: ?Current medicines are reviewed at length with the patient today.  Concerns regarding  medicines are outlined above.  ? ?Tests Ordered: ?No orders of the defined types were placed in this encounter. ? ? ?Medication Changes: ?Meds ordered this encounter  ?Medications  ? amoxicillin-clavulanate (AUGMENTIN) 875-125 MG tablet  ?  Sig: Take 1 tablet by mouth 2 (two) times daily.  ?  Dispense:  14 tablet  ?  Refill:  0  ? ? ? ?Note: This dictation was prepared with Dragon dictation along with smaller phrase technology. Similar sounding words can be transcribed inadequately or may not be corrected upon review. Any transcriptional errors that result from this process are unintentional.  ?  ? ? ?Disposition:  Follow up  ?Signed, ?Anabel Halon, MD  ?03/31/2022 4:26 PM    ? ?[facility] ?[facility] ?

## 2022-05-01 ENCOUNTER — Other Ambulatory Visit: Payer: Self-pay | Admitting: Internal Medicine

## 2022-05-01 ENCOUNTER — Other Ambulatory Visit (HOSPITAL_COMMUNITY): Payer: Self-pay | Admitting: Psychiatry

## 2022-05-01 DIAGNOSIS — I10 Essential (primary) hypertension: Secondary | ICD-10-CM

## 2022-05-06 ENCOUNTER — Encounter: Payer: PPO | Admitting: Internal Medicine

## 2022-05-12 ENCOUNTER — Ambulatory Visit (INDEPENDENT_AMBULATORY_CARE_PROVIDER_SITE_OTHER): Payer: Medicare HMO | Admitting: Internal Medicine

## 2022-05-12 ENCOUNTER — Encounter: Payer: Self-pay | Admitting: Internal Medicine

## 2022-05-12 VITALS — BP 132/84 | HR 100 | Resp 18 | Ht 71.0 in | Wt 256.6 lb

## 2022-05-12 DIAGNOSIS — E782 Mixed hyperlipidemia: Secondary | ICD-10-CM | POA: Diagnosis not present

## 2022-05-12 DIAGNOSIS — Z720 Tobacco use: Secondary | ICD-10-CM | POA: Diagnosis not present

## 2022-05-12 DIAGNOSIS — Z125 Encounter for screening for malignant neoplasm of prostate: Secondary | ICD-10-CM | POA: Diagnosis not present

## 2022-05-12 DIAGNOSIS — F312 Bipolar disorder, current episode manic severe with psychotic features: Secondary | ICD-10-CM

## 2022-05-12 DIAGNOSIS — G894 Chronic pain syndrome: Secondary | ICD-10-CM | POA: Diagnosis not present

## 2022-05-12 DIAGNOSIS — R69 Illness, unspecified: Secondary | ICD-10-CM | POA: Diagnosis not present

## 2022-05-12 DIAGNOSIS — Z0001 Encounter for general adult medical examination with abnormal findings: Secondary | ICD-10-CM | POA: Diagnosis not present

## 2022-05-12 DIAGNOSIS — E559 Vitamin D deficiency, unspecified: Secondary | ICD-10-CM | POA: Diagnosis not present

## 2022-05-12 DIAGNOSIS — I10 Essential (primary) hypertension: Secondary | ICD-10-CM | POA: Diagnosis not present

## 2022-05-12 NOTE — Assessment & Plan Note (Signed)
On Lyrica, Cymbalta and tramadol as needed ?Followed by pain clinic - [facility] ?

## 2022-05-12 NOTE — Assessment & Plan Note (Signed)
BP Readings from Last 1 Encounters:  ?05/12/22 132/84  ? ?Well-controlled with Telmisartan ?Counseled for compliance with the medications ?Advised DASH diet and moderate exercise/walking, at least 150 mins/week ? ?

## 2022-05-12 NOTE — Assessment & Plan Note (Signed)
Smokes about 0.5 pack/day  Asked about quitting: confirms that he/she currently smokes cigarettes Advise to quit smoking: Educated about QUITTING to reduce the risk of cancer, cardio and cerebrovascular disease. Assess willingness: Unwilling to quit at this time, but is working on cutting back. Assist with counseling and pharmacotherapy: Counseled for 5 minutes and literature provided. Arrange for follow up: follow up in 3 months and continue to offer help. 

## 2022-05-12 NOTE — Assessment & Plan Note (Signed)
Ordered PSA after discussing its limitations for prostate cancer screening, including false positive results leading additional investigations. 

## 2022-05-12 NOTE — Assessment & Plan Note (Signed)

## 2022-05-12 NOTE — Assessment & Plan Note (Addendum)
BMI Readings from Last 1 Encounters:  ?05/12/22 35.79 kg/m?  ? ?With HLD, depression and chronic pain ?Diet modification and moderate exercise advised ? ?

## 2022-05-12 NOTE — Progress Notes (Signed)
? ?Established Patient Office Visit ? ?Subjective:  ?Patient ID: [name] [name], male    DOB: 12/01/1966  Age: 56 y.o. MRN: [id number] ? ?CC:  ?Chief Complaint  ?Patient presents with  ? Annual Exam  ?  Annual exam   ? ? ?HPI ?[name] [name] is a 56 y.o. male with past medical history of bipolar disorder, chronic pain syndrome, polysubstance abuse, and perforated rectum s/p partial colectomy and colostomy who presents for annual physical. ? ?HTN: His BP is well-controlled. He takes Telmisartan 20 mg QD. Denies any chest pain, dyspnea or palpitations. ?  ?Bipolar disorder: He had Psychiatrist visit recently. He is to continue Risperidone, Atarax and Buspirone for now. He appears to be more attentive and in stable mood today compared to previous visits. He states that he has started living with his wife now. ? ?Chronic pain syndrome: He has chronic neuropathic pain, for which he takes Lyrica and Tramadol, followed by [facility]. He has seen [facility] for right knee OA, and was advised TKA. He wants to wait for now as he needs to have dental procedure. ? ? ?Past Medical History:  ?Diagnosis Date  ? Anxiety   ? Bipolar 1 disorder (HCC)   ? Chronic fatigue   ? Chronic pain   ? Colostomy in place [facility](HCC) 09/27/2019  ? Complete intestinal obstruction (HCC)   ? Depression   ? Phreesia 12/03/2020  ? Fecal peritonitis (HCC) 08/03/2019  ? Fibromyalgia   ? Hernia of abdominal wall   ? Hyperlipidemia   ? Phreesia 12/03/2020  ? Hypertension   ? Ileus, postoperative (HCC)   ? Perforated rectum (HCC) 08/03/2019  ? Rectal perforation (HCC) 12/23/2019  ? Sleep apnea   ? ? ?Past Surgical History:  ?Procedure Laterality Date  ? COLON RESECTION SIGMOID  08/03/2019  ? Procedure: COLON RESECTION SIGMOID;  Surgeon: Lucretia RoersBridges, Lindsay C, MD;  [facility]: [facility];  Service: General;;  ? COLONOSCOPY WITH PROPOFOL N/A 10/21/2021  ? Procedure: COLONOSCOPY WITH PROPOFOL;  Surgeon: Malissa Hippoehman, Najeeb U, MD;  [facility];   Service: Endoscopy;  Laterality: N/A;  955  ? COLOSTOMY N/A 08/03/2019  ? Procedure: COLOSTOMY;  Surgeon: Lucretia RoersBridges, Lindsay C, MD;  Location: [facility];  Service: General;  Laterality: N/A;  ? COLOSTOMY REVERSAL N/A 12/25/2021  ? Procedure: COLOSTOMY REVERSAL;  Surgeon: Lucretia RoersBridges, Lindsay C, MD;  Location: [facility];  Service: General;  Laterality: N/A;  ? FLEXIBLE SIGMOIDOSCOPY N/A 08/03/2019  ? Procedure: FLEXIBLE SIGMOIDOSCOPY;  Surgeon: Corbin Adeourk, Robert M, MD;  [facility];  Service: Endoscopy;  Laterality: N/A;  ? INCISIONAL HERNIA REPAIR N/A 12/25/2021  ? Procedure: INCISIONAL HERNIA REPAIR WITH ABSORBABLE MESH;  Surgeon: Lucretia RoersBridges, Lindsay C, MD;  Location: [facility];  Service: General;  Laterality: N/A;  ? KNEE ARTHROSCOPY Left 2006  ? miniscus tear  ? LAPAROTOMY N/A 08/03/2019  ? Procedure: EXPLORATORY LAPAROTOMY;  Surgeon: Lucretia RoersBridges, Lindsay C, MD;  Location: [facility];  Service: General;  Laterality: N/A;  ? LYMPH GLAND EXCISION Left 1983  ? PARASTOMAL HERNIA REPAIR Left 12/25/2021  ? Procedure: PARASTOMAL HERNIA REPAIR WITH ABSORBABLE MESH;  Surgeon: Lucretia RoersBridges, Lindsay C, MD;  Location: [facility];  Service: General;  Laterality: Left;  ? SMALL INTESTINE SURGERY N/A   ? Phreesia 12/03/2020  ? TONSILLECTOMY    ? ? ?Family History  ?Problem Relation Age of Onset  ? Diabetes Mother   ? Heart attack Father   ? Diabetes Maternal Grandmother   ? Colon cancer Neg  Hx   ? Colon polyps Neg Hx   ? ? ?Social History  ? ?Socioeconomic History  ? Marital status: Married  ?  Spouse name: [name]  ? Number of children: 0  ? Years of education: 29  ? Highest education level: Some college, no degree  ?Occupational History  ? Not on file  ?Tobacco Use  ? Smoking status: Every Day  ?  Packs/day: 1.00  ?  Years: 10.00  ?  Pack years: 10.00  ?  Types: Cigarettes  ? Smokeless tobacco: Never  ?Vaping Use  ? Vaping Use: Never used  ?Substance and Sexual Activity  ? Alcohol use: Not Currently  ?  Comment: denied 10/02/19  ? Drug use: Not Currently  ?   Types: Cocaine  ?  Comment: last used in December 2020.   ? Sexual activity: Not Currently  ?Other Topics Concern  ? Not on file  ?Social History Narrative  ? Pt lives alone and is on disability. Counts his mother, sister and aunt as his social supports.   ? ?Social Determinants of Health  ? ?Financial Resource Strain: Low Risk   ? Difficulty of Paying Living Expenses: Not hard at all  ?Food Insecurity: No Food Insecurity  ? Worried About Programme researcher, broadcasting/film/video in the Last Year: Never true  ? Ran Out of Food in the Last Year: Never true  ?Transportation Needs: Unmet Transportation Needs  ? Lack of Transportation (Medical): Not on file  ? Lack of Transportation (Non-Medical): Yes  ?Physical Activity: Inactive  ? Days of Exercise per Week: 0 days  ? Minutes of Exercise per Session: 0 min  ?Stress: No Stress Concern Present  ? Feeling of Stress : Only a little  ?Social Connections: Moderately Integrated  ? Frequency of Communication with Friends and Family: More than three times a week  ? Frequency of Social Gatherings with Friends and Family: Once a week  ? Attends Religious Services: 1 to 4 times per year  ? Active Member of Clubs or Organizations: No  ? Attends Banker Meetings: Never  ? Marital Status: Married  ?Intimate Partner Violence: Not At Risk  ? Fear of Current or Ex-Partner: No  ? Emotionally Abused: No  ? Physically Abused: No  ? Sexually Abused: No  ? ? ?Outpatient Medications Prior to Visit  ?Medication Sig Dispense Refill  ? acetaminophen (TYLENOL) 500 MG tablet Take 500 mg by mouth every 6 (six) hours as needed for moderate pain.    ? busPIRone (BUSPAR) 7.5 MG tablet Take 1 tablet (7.5 mg total) by mouth daily. 30 tablet 1  ? cyclobenzaprine (FLEXERIL) 10 MG tablet Take 10 mg by mouth 2 (two) times daily.    ? DULoxetine (CYMBALTA) 60 MG capsule Take 60 mg by mouth daily.    ? hydrOXYzine (VISTARIL) 25 MG capsule TAKE 1 CAPSULE TWICE DAILY AS NEEDED FOR ANXIETY. 60 capsule 0  ? nicotine  (NICODERM CQ - DOSED IN MG/24 HOURS) 21 mg/24hr patch Place 21 mg onto the skin daily.    ? ondansetron (ZOFRAN) 4 MG tablet Take 1 tablet (4 mg total) by mouth every 6 (six) hours as needed for nausea. 20 tablet 0  ? pregabalin (LYRICA) 75 MG capsule Take 75 mg by mouth 3 (three) times daily.    ? risperidone (RISPERDAL) 4 MG tablet TAKE (1) TABLET BY MOUTH AT BEDTIME. 30 tablet 0  ? telmisartan (MICARDIS) 20 MG tablet TAKE (1) TABLET BY MOUTH ONCE A DAY. 90 tablet  0  ? traMADol (ULTRAM) 50 MG tablet Take 50 mg by mouth 4 (four) times daily.    ? meloxicam (MOBIC) 7.5 MG tablet Take 1 tablet (7.5 mg total) by mouth daily. 30 tablet 2  ? amoxicillin-clavulanate (AUGMENTIN) 875-125 MG tablet Take 1 tablet by mouth 2 (two) times daily. (Patient not taking: Reported on 05/12/2022) 14 tablet 0  ? ?No facility-administered medications prior to visit.  ? ? ?Allergies  ?Allergen Reactions  ? Codeine Shortness Of Breath and Swelling  ? Divalproex Sodium Diarrhea, Itching, Rash, Shortness Of Breath and Swelling  ? Erythromycin Hives, Itching, Rash and Swelling  ? Iodinated Contrast Media Swelling  ?  Patient states he received "red Dye" 15-20 years ago and become red/flushed. No other symptoms. Patient did do 13 hour pre meds for CT 09/17/2021  ? Latex Itching and Rash  ? Morphine And Related Shortness Of Breath and Swelling  ? Sulfa Antibiotics Diarrhea, Itching, Rash, Shortness Of Breath and Swelling  ? Wheat Bran Hives, Itching, Rash, Shortness Of Breath and Swelling  ? Demerol [Meperidine]   ?  Body over heats and pouring sweat  ? Levofloxacin   ?  Pt does not recall reaction   ? Niacin And Related Rash  ? Penicillins Swelling and Rash  ?  REACTION: Rash and facial swelling at age 46  ? ? ?ROS ?Review of Systems  ?Constitutional:  Negative for chills and fever.  ?HENT:  Negative for congestion and sore throat.   ?Eyes:  Negative for pain and discharge.  ?Respiratory:  Negative for cough and shortness of breath.    ?Cardiovascular:  Negative for chest pain and palpitations.  ?Gastrointestinal:  Negative for constipation, diarrhea, nausea and vomiting.  ?Endocrine: Negative for polydipsia and polyuria.  ?Genitourinary:  Linna Caprice

## 2022-05-12 NOTE — Patient Instructions (Addendum)
Please continue taking medications as prescribed. ? ?Please follow low carb diet and ambulate as tolerated. ? ?Please consider getting Shingrix vaccine at your local pharmacy. ?

## 2022-05-14 ENCOUNTER — Telehealth: Payer: Self-pay | Admitting: Internal Medicine

## 2022-05-14 ENCOUNTER — Other Ambulatory Visit: Payer: Self-pay | Admitting: Internal Medicine

## 2022-05-14 DIAGNOSIS — E782 Mixed hyperlipidemia: Secondary | ICD-10-CM

## 2022-05-14 LAB — CBC WITH DIFFERENTIAL/PLATELET
Basophils Absolute: 0 10*3/uL (ref 0.0–0.2)
Basos: 0 %
EOS (ABSOLUTE): 0.2 10*3/uL (ref 0.0–0.4)
Eos: 2 %
Hematocrit: 44.2 % (ref 37.5–51.0)
Hemoglobin: 15.1 g/dL (ref 13.0–17.7)
Immature Grans (Abs): 0 10*3/uL (ref 0.0–0.1)
Immature Granulocytes: 0 %
Lymphocytes Absolute: 2.4 10*3/uL (ref 0.7–3.1)
Lymphs: 22 %
MCH: 27.5 pg (ref 26.6–33.0)
MCHC: 34.2 g/dL (ref 31.5–35.7)
MCV: 80 fL (ref 79–97)
Monocytes Absolute: 0.5 10*3/uL (ref 0.1–0.9)
Monocytes: 5 %
Neutrophils Absolute: 7.9 10*3/uL — ABNORMAL HIGH (ref 1.4–7.0)
Neutrophils: 71 %
Platelets: 304 10*3/uL (ref 150–450)
RBC: 5.5 x10E6/uL (ref 4.14–5.80)
RDW: 14.6 % (ref 11.6–15.4)
WBC: 11 10*3/uL — ABNORMAL HIGH (ref 3.4–10.8)

## 2022-05-14 LAB — LIPID PANEL
Chol/HDL Ratio: 7.9 ratio — ABNORMAL HIGH (ref 0.0–5.0)
Cholesterol, Total: 238 mg/dL — ABNORMAL HIGH (ref 100–199)
HDL: 30 mg/dL — ABNORMAL LOW (ref >39)
LDL Chol Calc (NIH): 132 mg/dL — ABNORMAL HIGH (ref 0–99)
Triglycerides: 417 mg/dL — ABNORMAL HIGH (ref 0–149)
VLDL Cholesterol Cal: 76 mg/dL — ABNORMAL HIGH (ref 5–40)

## 2022-05-14 LAB — CMP14+EGFR
ALT: 15 IU/L (ref 0–44)
AST: 14 IU/L (ref 0–40)
Albumin/Globulin Ratio: 1.4 (ref 1.2–2.2)
Albumin: 4.2 g/dL (ref 3.8–4.9)
Alkaline Phosphatase: 124 IU/L — ABNORMAL HIGH (ref 44–121)
BUN/Creatinine Ratio: 11 (ref 9–20)
BUN: 11 mg/dL (ref 6–24)
Bilirubin Total: 0.3 mg/dL (ref 0.0–1.2)
CO2: 21 mmol/L (ref 20–29)
Calcium: 9 mg/dL (ref 8.7–10.2)
Chloride: 97 mmol/L (ref 96–106)
Creatinine, Ser: 1.03 mg/dL (ref 0.76–1.27)
Globulin, Total: 2.9 g/dL (ref 1.5–4.5)
Glucose: 141 mg/dL — ABNORMAL HIGH (ref 70–99)
Potassium: 4.4 mmol/L (ref 3.5–5.2)
Sodium: 135 mmol/L (ref 134–144)
Total Protein: 7.1 g/dL (ref 6.0–8.5)
eGFR: 85 mL/min/{1.73_m2} (ref >59)

## 2022-05-14 LAB — HEMOGLOBIN A1C
Est. average glucose Bld gHb Est-mCnc: 134 mg/dL
Hgb A1c MFr Bld: 6.3 % — ABNORMAL HIGH (ref 4.8–5.6)

## 2022-05-14 LAB — PSA: Prostate Specific Ag, Serum: 0.4 ng/mL (ref 0.0–4.0)

## 2022-05-14 LAB — TSH: TSH: 1.49 u[IU]/mL (ref 0.450–4.500)

## 2022-05-14 LAB — VITAMIN D 25 HYDROXY (VIT D DEFICIENCY, FRACTURES): Vit D, 25-Hydroxy: 13.1 ng/mL — ABNORMAL LOW (ref 30.0–100.0)

## 2022-05-14 MED ORDER — ROSUVASTATIN CALCIUM 10 MG PO TABS: 10.0000 mg | ORAL_TABLET | Freq: Every day | ORAL | 1 refills | Status: DC

## 2022-05-14 NOTE — Telephone Encounter (Signed)
Pt called back for lab results. Pt states to please call him back in the afternoon.

## 2022-05-17 ENCOUNTER — Telehealth: Payer: Self-pay | Admitting: Internal Medicine

## 2022-05-17 ENCOUNTER — Encounter: Payer: Self-pay | Admitting: *Deleted

## 2022-05-17 NOTE — Telephone Encounter (Signed)
LVM for pt to call the office regarding lab results for 3rd attempt letter sent through [facility]

## 2022-05-17 NOTE — Telephone Encounter (Signed)
Letter has been mailed to pt regarding lab results   4th attempt to give lab results LVM

## 2022-05-17 NOTE — Telephone Encounter (Signed)
Pt called back for labs 

## 2022-06-07 ENCOUNTER — Telehealth (HOSPITAL_COMMUNITY): Payer: Medicare HMO | Admitting: Psychiatry

## 2022-06-10 ENCOUNTER — Encounter (HOSPITAL_COMMUNITY): Payer: Self-pay | Admitting: Psychiatry

## 2022-06-10 ENCOUNTER — Telehealth (INDEPENDENT_AMBULATORY_CARE_PROVIDER_SITE_OTHER): Payer: Medicare HMO | Admitting: Psychiatry

## 2022-06-10 DIAGNOSIS — F419 Anxiety disorder, unspecified: Secondary | ICD-10-CM | POA: Diagnosis not present

## 2022-06-10 DIAGNOSIS — F3112 Bipolar disorder, current episode manic without psychotic features, moderate: Secondary | ICD-10-CM

## 2022-06-10 DIAGNOSIS — F5102 Adjustment insomnia: Secondary | ICD-10-CM | POA: Diagnosis not present

## 2022-06-10 DIAGNOSIS — R69 Illness, unspecified: Secondary | ICD-10-CM | POA: Diagnosis not present

## 2022-06-10 MED ORDER — DULOXETINE HCL 60 MG PO CPEP
60.0000 mg | ORAL_CAPSULE | Freq: Every day | ORAL | 1 refills | Status: DC
Start: 2022-06-10 — End: 2022-10-08

## 2022-06-10 MED ORDER — RISPERIDONE 4 MG PO TABS: ORAL_TABLET | ORAL | 0 refills | Status: DC

## 2022-06-10 MED ORDER — BUSPIRONE HCL 7.5 MG PO TABS: 7.5000 mg | ORAL_TABLET | Freq: Every day | ORAL | 1 refills | Status: DC

## 2022-06-10 NOTE — Progress Notes (Signed)
[facility] Followup visit   Patient Identification: [name] [name] MRN:  [id number] Date of Evaluation:  06/10/2022 Referral Source: [facility] Discharge Chief Complaint:  Follow up bipolar  Visit Diagnosis:    ICD-10-CM   1. Bipolar disorder, curr episode manic w/o psychotic features, moderate (HCC)  F31.12     2. Anxiety  F41.9     3. Adjustment insomnia  F51.02      Virtual Visit via Video Note  I connected with [name] [name] on 06/10/22 at  3:00 PM EDT by a video enabled telemedicine application and verified that I am speaking with the correct person using two identifiers.  Location: Patient: home Provider: home office   I discussed the limitations of evaluation and management by telemedicine and the availability of in person appointments. The patient expressed understanding and agreed to proceed.     I discussed the assessment and treatment plan with the patient. The patient was provided an opportunity to ask questions and all were answered. The patient agreed with the plan and demonstrated an understanding of the instructions.   The patient was advised to call back or seek an in-person evaluation if the symptoms worsen or if the condition fails to improve as anticipated.  I provided 15 minutes of non-face-to-face time during this encounter.    History of Present Illness:  Patient initially referred from discharge during April 2021 admission Admitted with mania and psychosis, in past , non compliance and alcohol use  Handling meds, mood is balanced, worries related to getting tooth fixed and medical co morbidites.including planning knee surgery  Lyrica helping pain as well Vistaril for sleep and anxiety   Aggravating factors; drug use per history , non compliance , knee pain and medical co morbidities Modifying factors; mother, wife Duration: adult life Severity mood wise not worse  Past Psychiatric History: bipolar disorder  Previous Psychotropic Medications: Yes   Substance  Abuse History in the last 12 months:  No.  Consequences of Substance Abuse: not gives clear history of using drugs or minimizes use  Past Medical History:  Past Medical History:  Diagnosis Date   Anxiety    Bipolar 1 disorder (HCC)    Chronic fatigue    Chronic pain    Colostomy in place (HCC) 09/27/2019   Complete intestinal obstruction (HCC)    Depression    Phreesia 12/03/2020   Fecal peritonitis (HCC) 08/03/2019   Fibromyalgia    Hernia of abdominal wall    Hyperlipidemia    Phreesia 12/03/2020   Hypertension    Ileus, postoperative (HCC)    Perforated rectum (HCC) 08/03/2019   Rectal perforation (HCC) 12/23/2019   Sleep apnea     Past Surgical History:  Procedure Laterality Date   COLON RESECTION SIGMOID  08/03/2019   Procedure: COLON RESECTION SIGMOID;  Surgeon: Lucretia Roers, MD;  Location: [facility];  Service: General;;   COLONOSCOPY WITH PROPOFOL N/A 10/21/2021   Procedure: COLONOSCOPY WITH PROPOFOL;  Surgeon: Malissa Hippo, MD;  Location: [facility];  Service: Endoscopy;  Laterality: N/A;  955   COLOSTOMY N/A 08/03/2019   Procedure: COLOSTOMY;  Surgeon: Lucretia Roers, MD;  Location: [facility];  Service: General;  Laterality: N/A;   COLOSTOMY REVERSAL N/A 12/25/2021   Procedure: COLOSTOMY REVERSAL;  Surgeon: Lucretia Roers, MD;  Location: [facility];  Service: General;  Laterality: N/A;   FLEXIBLE SIGMOIDOSCOPY N/A 08/03/2019   Procedure: FLEXIBLE SIGMOIDOSCOPY;  Surgeon: Corbin Ade, MD;  Location: [facility];  Service: Endoscopy;  Laterality: N/A;   INCISIONAL HERNIA REPAIR N/A 12/25/2021   Procedure: INCISIONAL HERNIA REPAIR WITH ABSORBABLE MESH;  Surgeon: Lucretia Roers, MD;  Location: [facility];  Service: General;  Laterality: N/A;   KNEE ARTHROSCOPY Left 2006   miniscus tear   LAPAROTOMY N/A 08/03/2019   Procedure: EXPLORATORY LAPAROTOMY;  Surgeon: Lucretia Roers, MD;  Location: [facility];  Service: General;  Laterality: N/A;   LYMPH GLAND  EXCISION Left 1983   PARASTOMAL HERNIA REPAIR Left 12/25/2021   Procedure: PARASTOMAL HERNIA REPAIR WITH ABSORBABLE MESH;  Surgeon: Lucretia Roers, MD;  Location: [facility];  Service: General;  Laterality: Left;   SMALL INTESTINE SURGERY N/A    Phreesia 12/03/2020   TONSILLECTOMY      Family Psychiatric History: denies  Family History:  Family History  Problem Relation Age of Onset   Diabetes Mother    Heart attack Father    Diabetes Maternal Grandmother    Colon cancer Neg Hx    Colon polyps Neg Hx     Social History:   Social History   Socioeconomic History   Marital status: Married    Spouse name: [name]   Number of children: 0   Years of education: 12   Highest education level: Some college, no degree  Occupational History   Not on file  Tobacco Use   Smoking status: Every Day    Packs/day: 1.00    Years: 10.00    Total pack years: 10.00    Types: Cigarettes   Smokeless tobacco: Never  Vaping Use   Vaping Use: Never used  Substance and Sexual Activity   Alcohol use: Not Currently    Comment: denied 10/02/19   Drug use: Not Currently    Types: Cocaine    Comment: last used in December 2020.    Sexual activity: Not Currently  Other Topics Concern   Not on file  Social History Narrative   Pt lives alone and is on disability. Counts his mother, sister and aunt as his social supports.    Social Determinants of Health   Financial Resource Strain: Low Risk  (12/30/2021)   Overall Financial Resource Strain (CARDIA)    Difficulty of Paying Living Expenses: Not hard at all  Food Insecurity: No Food Insecurity (12/30/2021)   Hunger Vital Sign    Worried About Running Out of Food in the Last Year: Never true    Ran Out of Food in the Last Year: Never true  Transportation Needs: Unmet Transportation Needs (12/30/2021)   PRAPARE - Administrator, Civil Service (Medical): Not on file    Lack of Transportation (Non-Medical): Yes  Physical Activity: Inactive  (12/30/2021)   Exercise Vital Sign    Days of Exercise per Week: 0 days    Minutes of Exercise per Session: 0 min  Stress: No Stress Concern Present (12/30/2021)   [employer] of Occupational Health - Occupational Stress Questionnaire    Feeling of Stress : Only a little  Social Connections: Moderately Integrated (12/30/2021)   Social Connection and Isolation Panel [NHANES]    Frequency of Communication with Friends and Family: More than three times a week    Frequency of Social Gatherings with Friends and Family: Once a week    Attends Religious Services: 1 to 4 times per year    Active Member of [employer] or Organizations: No    Attends Banker Meetings: Never    Marital Status: Married  Allergies:   Allergies  Allergen Reactions   Codeine Shortness Of Breath and Swelling   Divalproex Sodium Diarrhea, Itching, Rash, Shortness Of Breath and Swelling   Erythromycin Hives, Itching, Rash and Swelling   Iodinated Contrast Media Swelling    Patient states he received "red Dye" 15-20 years ago and become red/flushed. No other symptoms. Patient did do 13 hour pre meds for CT 09/17/2021   Latex Itching and Rash   Morphine And Related Shortness Of Breath and Swelling   Sulfa Antibiotics Diarrhea, Itching, Rash, Shortness Of Breath and Swelling   Wheat Bran Hives, Itching, Rash, Shortness Of Breath and Swelling   Demerol [Meperidine]     Body over heats and pouring sweat   Levofloxacin     Pt does not recall reaction    Niacin And Related Rash   Penicillins Swelling and Rash    REACTION: Rash and facial swelling at age 52    Metabolic Disorder Labs: Lab Results  Component Value Date   HGBA1C 6.3 (H) 05/12/2022   MPG 114.02 12/23/2021   MPG 108.28 04/07/2021   No results found for: "PROLACTIN" Lab Results  Component Value Date   CHOL 238 (H) 05/12/2022   TRIG 417 (H) 05/12/2022   HDL 30 (L) 05/12/2022   CHOLHDL 7.9 (H) 05/12/2022   VLDL 32 04/09/2021   LDLCALC  132 (H) 05/12/2022   LDLCALC 92 06/01/2021   Lab Results  Component Value Date   TSH 1.490 05/12/2022    Therapeutic Level Labs: Lab Results  Component Value Date   LITHIUM <0.06 (L) 09/20/2019   Lab Results  Component Value Date   CBMZ 6.7 04/19/2021   No results found for: "VALPROATE"  Current Medications: Current Outpatient Medications  Medication Sig Dispense Refill   acetaminophen (TYLENOL) 500 MG tablet Take 500 mg by mouth every 6 (six) hours as needed for moderate pain.     busPIRone (BUSPAR) 7.5 MG tablet Take 1 tablet (7.5 mg total) by mouth daily. 30 tablet 1   cyclobenzaprine (FLEXERIL) 10 MG tablet Take 10 mg by mouth 2 (two) times daily.     DULoxetine (CYMBALTA) 60 MG capsule Take 1 capsule (60 mg total) by mouth daily. 30 capsule 1   hydrOXYzine (VISTARIL) 25 MG capsule TAKE 1 CAPSULE TWICE DAILY AS NEEDED FOR ANXIETY. 60 capsule 0   nicotine (NICODERM CQ - DOSED IN MG/24 HOURS) 21 mg/24hr patch Place 21 mg onto the skin daily.     ondansetron (ZOFRAN) 4 MG tablet Take 1 tablet (4 mg total) by mouth every 6 (six) hours as needed for nausea. 20 tablet 0   pregabalin (LYRICA) 75 MG capsule Take 75 mg by mouth 3 (three) times daily.     risperidone (RISPERDAL) 4 MG tablet Take at night 30 tablet 0   rosuvastatin (CRESTOR) 10 MG tablet Take 1 tablet (10 mg total) by mouth daily. 90 tablet 1   telmisartan (MICARDIS) 20 MG tablet TAKE (1) TABLET BY MOUTH ONCE A DAY. 90 tablet 0   traMADol (ULTRAM) 50 MG tablet Take 50 mg by mouth 4 (four) times daily.     No current facility-administered medications for this visit.      Psychiatric Specialty Exam: Review of Systems  Cardiovascular:  Negative for chest pain.  Musculoskeletal:  Positive for arthralgias.  Psychiatric/Behavioral:  Negative for agitation, hallucinations and self-injury.     There were no vitals taken for this visit.There is no height or weight on file to calculate BMI.  General Appearance: casual   Eye Contact: fair  Speech:  more clear  Volume:  Normal  Mood:  Euthymic  Affectfair  Thought Process:  Coherent  Orientation:  Full (Time, Place, and Person)  Thought Content:  Rumination  Suicidal Thoughts:  No  Homicidal Thoughts:  No  Memory:  Immediate;   Fair Recent;   Fair  Judgement:  Other:  limited  Insight:  Shallow  Psychomotor Activity:  Normal  Concentration:  Concentration: Fair and Attention Span: Fair  Recall:  [employer] of Knowledge:Fair  Language: Fair  Akathisia:  No  Handed:    AIMS (if indicated):no involuntary movements noteiceable  Assets:  Desire for Improvement  ADL's:  Intact  Cognition: WNL  Sleep:  Fair   Screenings: AIMS    Flowsheet Row [facility]  AIMS Total Score 0      AUDIT    Flowsheet Row [facility]  Alcohol Use Disorder Identification Test Final Score (AUDIT) 0      GAD-7    Flowsheet Row Counselor [facility]  Total GAD-7 Score 4      PHQ2-9    Flowsheet Row Office Visit from 05/12/2022 in [facility] Office Visit from 03/31/2022 in [facility] Office Visit from 02/08/2022 in [facility] Office Visit from 01/20/2022 in [facility] Office Visit from 01/08/2022 in [facility]  PHQ-2 Total Score 0 0 0 0 0      Flowsheet Row [facility]/2022 [facility] [place] Video Visit from 02[facility]24/2022 in [facility] [place] Admission (Discharged) from 12/25/2021 in [facility]  C-SSRS RISK CATEGORY No Risk No Risk No Risk       Assessment and Plan: as follows  Prior documentation reviewed   Bipolar current episode manic with psychosis: doing fair, continue risperdal at  night 54m Also on lyrica discussed its a mood stabilizer as well  Avoid klonopine  Adjustment insomnia  : doing fair manageable on vistaril and sleep hygiene, risperdal  No tremors  Anxiety disorder unspecified ; worries related to surgery, continue buspar, cymbalta   Meds renewed  Fu 2 - 74m or earlier if needed   Thresa Ross, MD 6/15/20233:15 PM

## 2022-07-02 ENCOUNTER — Telehealth: Payer: Self-pay | Admitting: Internal Medicine

## 2022-07-02 ENCOUNTER — Other Ambulatory Visit: Payer: Self-pay | Admitting: *Deleted

## 2022-07-02 ENCOUNTER — Encounter: Payer: PPO | Admitting: Internal Medicine

## 2022-07-02 DIAGNOSIS — I10 Essential (primary) hypertension: Secondary | ICD-10-CM

## 2022-07-02 MED ORDER — TELMISARTAN 20 MG PO TABS: ORAL_TABLET | ORAL | 0 refills | Status: DC

## 2022-07-02 NOTE — Telephone Encounter (Signed)
Medication sent to pharmacy  

## 2022-07-02 NOTE — Telephone Encounter (Signed)
Pt called stating he is needing a new rx for his bp med sent to his new phar. Can you please refill with new rx?  telmisartan (MICARDIS) 20 MG tablet   [facility] [place]

## 2022-07-13 ENCOUNTER — Other Ambulatory Visit (HOSPITAL_COMMUNITY): Payer: Self-pay

## 2022-07-13 DIAGNOSIS — F419 Anxiety disorder, unspecified: Secondary | ICD-10-CM

## 2022-07-13 MED ORDER — RISPERIDONE 4 MG PO TABS: ORAL_TABLET | ORAL | 1 refills | Status: DC

## 2022-07-13 MED ORDER — HYDROXYZINE PAMOATE 25 MG PO CAPS: ORAL_CAPSULE | ORAL | 1 refills | Status: DC

## 2022-07-23 ENCOUNTER — Telehealth (HOSPITAL_COMMUNITY): Payer: Self-pay | Admitting: Psychiatry

## 2022-07-23 DIAGNOSIS — Z79899 Other long term (current) drug therapy: Secondary | ICD-10-CM | POA: Diagnosis not present

## 2022-07-23 DIAGNOSIS — M545 Low back pain, unspecified: Secondary | ICD-10-CM | POA: Diagnosis not present

## 2022-07-23 DIAGNOSIS — G894 Chronic pain syndrome: Secondary | ICD-10-CM | POA: Diagnosis not present

## 2022-07-23 DIAGNOSIS — M79606 Pain in leg, unspecified: Secondary | ICD-10-CM | POA: Diagnosis not present

## 2022-07-23 NOTE — Telephone Encounter (Signed)
Patient called stating he had seen neurologist (? FNP Jorge Mandril) today and she instructed him to call provider and request stopping risperidone due to involuntary tongue movements for past 1.5 months, and to ask about a different medication to start. Last dose was last night.

## 2022-07-26 ENCOUNTER — Encounter (HOSPITAL_COMMUNITY): Payer: Self-pay | Admitting: Psychiatry

## 2022-07-26 ENCOUNTER — Telehealth (INDEPENDENT_AMBULATORY_CARE_PROVIDER_SITE_OTHER): Payer: Medicare HMO | Admitting: Psychiatry

## 2022-07-26 DIAGNOSIS — R29818 Other symptoms and signs involving the nervous system: Secondary | ICD-10-CM | POA: Diagnosis not present

## 2022-07-26 DIAGNOSIS — F5102 Adjustment insomnia: Secondary | ICD-10-CM | POA: Diagnosis not present

## 2022-07-26 DIAGNOSIS — R69 Illness, unspecified: Secondary | ICD-10-CM | POA: Diagnosis not present

## 2022-07-26 DIAGNOSIS — F3112 Bipolar disorder, current episode manic without psychotic features, moderate: Secondary | ICD-10-CM | POA: Diagnosis not present

## 2022-07-26 NOTE — Progress Notes (Signed)
[facility] Followup visit   Patient Identification: [name] [name] MRN:  [id number] Date of Evaluation:  07/26/2022 Referral Source: [facility] Discharge Chief Complaint:  Follow up bipolar , tongue movements Visit Diagnosis:    ICD-10-CM   1. Bipolar disorder, curr episode manic w/o psychotic features, moderate (HCC)  F31.12     2. Adjustment insomnia  F51.02     3. Extrapyramidal symptom  R29.818      Virtual Visit via Video Note  I connected with [name] [name] on 07/26/22 at  3:45 PM EDT by a video enabled telemedicine application and verified that I am speaking with the correct person using two identifiers.  Location: Patient: home Provider: home office   I discussed the limitations of evaluation and management by telemedicine and the availability of in person appointments. The patient expressed understanding and agreed to proceed.     I discussed the assessment and treatment plan with the patient. The patient was provided an opportunity to ask questions and all were answered. The patient agreed with the plan and demonstrated an understanding of the instructions.   The patient was advised to call back or seek an in-person evaluation if the symptoms worsen or if the condition fails to improve as anticipated.  I provided 15 minutes of non-face-to-face time during this encounter.    History of Present Illness:  Patient initially referred from discharge during April 2021 admission Admitted with mania and psychosis, in past , non compliance and alcohol use  Has been doing fair, lyrica changed to gabapentin Early appointment as was having tongue movements and neurology suggested to review risperdal. We did lower to half of 4mg  after the call Some better, less bothersome He feels we can continue lower risperdal while they adjust gabapentin. Understands gaba can also be used as mood stabilizer  Has been unstable in past and despite risk of being on risperdal or side effects has done better,     Aggravating factors; drug use per history , non compliance , knee pain and medical co morbidities Modifying factors; mother, wife Duration: adult life Severity mood wise not worse  Past Psychiatric History: bipolar disorder  Previous Psychotropic Medications: Yes   Substance Abuse History in the last 12 months:  No.  Consequences of Substance Abuse: not gives clear history of using drugs or minimizes use  Past Medical History:  Past Medical History:  Diagnosis Date   Anxiety    Bipolar 1 disorder (HCC)    Chronic fatigue    Chronic pain    Colostomy in place (HCC) 09/27/2019   Complete intestinal obstruction (HCC)    Depression    Phreesia 12/03/2020   Fecal peritonitis (HCC) 08/03/2019   Fibromyalgia    Hernia of abdominal wall    Hyperlipidemia    Phreesia 12/03/2020   Hypertension    Ileus, postoperative (HCC)    Perforated rectum (HCC) 08/03/2019   Rectal perforation (HCC) 12/23/2019   Sleep apnea     Past Surgical History:  Procedure Laterality Date   COLON RESECTION SIGMOID  08/03/2019   Procedure: COLON RESECTION SIGMOID;  Surgeon: 10/03/2019, MD;  Location: [facility];  Service: General;;   COLONOSCOPY WITH PROPOFOL N/A 10/21/2021   Procedure: COLONOSCOPY WITH PROPOFOL;  Surgeon: 10/23/2021, MD;  Location: [facility];  Service: Endoscopy;  Laterality: N/A;  955   COLOSTOMY N/A 08/03/2019   Procedure: COLOSTOMY;  Surgeon: 10/03/2019, MD;  Location: [facility];  Service: General;  Laterality: N/A;  COLOSTOMY REVERSAL N/A 12/25/2021   Procedure: COLOSTOMY REVERSAL;  Surgeon: Lucretia Roers, MD;  Location: [facility];  Service: General;  Laterality: N/A;   FLEXIBLE SIGMOIDOSCOPY N/A 08/03/2019   Procedure: FLEXIBLE SIGMOIDOSCOPY;  Surgeon: Corbin Ade, MD;  Location[facility];  Service: Endoscopy;  Laterality: N/A;   INCISIONAL HERNIA REPAIR N/A 12/25/2021   Procedure: INCISIONAL HERNIA REPAIR WITH ABSORBABLE MESH;  Surgeon: Lucretia Roers, MD;  Location: [facility];  Service: General;  Laterality: N/A;   KNEE ARTHROSCOPY Left 2006   miniscus tear   LAPAROTOMY N/A 08/03/2019   Procedure: EXPLORATORY LAPAROTOMY;  Surgeon: Lucretia Roers, MD;  Location: [facility];  Service: General;  Laterality: N/A;   LYMPH GLAND EXCISION Left 1983   PARASTOMAL HERNIA REPAIR Left 12/25/2021   Procedure: PARASTOMAL HERNIA REPAIR WITH ABSORBABLE MESH;  Surgeon: Lucretia Roers, MD;  Location: [facility];  Service: General;  Laterality: Left;   SMALL INTESTINE SURGERY N/A    Phreesia 12/03/2020   TONSILLECTOMY      Family Psychiatric History: denies  Family History:  Family History  Problem Relation Age of Onset   Diabetes Mother    Heart attack Father    Diabetes Maternal Grandmother    Colon cancer Neg Hx    Colon polyps Neg Hx     Social History:   Social History   Socioeconomic History   Marital status: Married    Spouse name: [name]   Number of children: 0   Years of education: 12   Highest education level: Some college, no degree  Occupational History   Not on file  Tobacco Use   Smoking status: Every Day    Packs/day: 1.00    Years: 10.00    Total pack years: 10.00    Types: Cigarettes   Smokeless tobacco: Never  Vaping Use   Vaping Use: Never used  Substance and Sexual Activity   Alcohol use: Not Currently    Comment: denied 10/02/19   Drug use: Not Currently    Types: Cocaine    Comment: last used in December 2020.    Sexual activity: Not Currently  Other Topics Concern   Not on file  Social History Narrative   Pt lives alone and is on disability. Counts his mother, sister and aunt as his social supports.    Social Determinants of Health   Financial Resource Strain: Low Risk  (12/30/2021)   Overall Financial Resource Strain (CARDIA)    Difficulty of Paying Living Expenses: Not hard at all  Food Insecurity: No Food Insecurity (12/30/2021)   Hunger Vital Sign    Worried About Running Out of Food in the Last  Year: Never true    Ran Out of Food in the Last Year: Never true  Transportation Needs: Unmet Transportation Needs (12/30/2021)   PRAPARE - Administrator, Civil Service (Medical): Not on file    Lack of Transportation (Non-Medical): Yes  Physical Activity: Inactive (12/30/2021)   Exercise Vital Sign    Days of Exercise per Week: 0 days    Minutes of Exercise per Session: 0 min  Stress: No Stress Concern Present (12/30/2021)   [employer] of Occupational Health - Occupational Stress Questionnaire    Feeling of Stress : Only a little  Social Connections: Moderately Integrated (12/30/2021)   Social Connection and Isolation Panel [NHANES]    Frequency of Communication with Friends and Family: More than three times a week    Frequency of  Social Gatherings with Friends and Family: Once a week    Attends Religious Services: 1 to 4 times per year    Active Member of [employer] or Organizations: No    Attends Banker Meetings: Never    Marital Status: Married     Allergies:   Allergies  Allergen Reactions   Codeine Shortness Of Breath and Swelling   Divalproex Sodium Diarrhea, Itching, Rash, Shortness Of Breath and Swelling   Erythromycin Hives, Itching, Rash and Swelling   Iodinated Contrast Media Swelling    Patient states he received "red Dye" 15-20 years ago and become red/flushed. No other symptoms. Patient did do 13 hour pre meds for CT 09/17/2021   Latex Itching and Rash   Morphine And Related Shortness Of Breath and Swelling   Sulfa Antibiotics Diarrhea, Itching, Rash, Shortness Of Breath and Swelling   Wheat Bran Hives, Itching, Rash, Shortness Of Breath and Swelling   Demerol [Meperidine]     Body over heats and pouring sweat   Levofloxacin     Pt does not recall reaction    Niacin And Related Rash   Penicillins Swelling and Rash    REACTION: Rash and facial swelling at age 79    Metabolic Disorder Labs: Lab Results  Component Value Date   HGBA1C 6.3  (H) 05/12/2022   MPG 114.02 12/23/2021   MPG 108.28 04/07/2021   No results found for: "PROLACTIN" Lab Results  Component Value Date   CHOL 238 (H) 05/12/2022   TRIG 417 (H) 05/12/2022   HDL 30 (L) 05/12/2022   CHOLHDL 7.9 (H) 05/12/2022   VLDL 32 04/09/2021   LDLCALC 132 (H) 05/12/2022   LDLCALC 92 06/01/2021   Lab Results  Component Value Date   TSH 1.490 05/12/2022    Therapeutic Level Labs: Lab Results  Component Value Date   LITHIUM <0.06 (L) 09/20/2019   Lab Results  Component Value Date   CBMZ 6.7 04/19/2021   No results found for: "VALPROATE"  Current Medications: Current Outpatient Medications  Medication Sig Dispense Refill   acetaminophen (TYLENOL) 500 MG tablet Take 500 mg by mouth every 6 (six) hours as needed for moderate pain.     busPIRone (BUSPAR) 7.5 MG tablet Take 1 tablet (7.5 mg total) by mouth daily. 30 tablet 1   cyclobenzaprine (FLEXERIL) 10 MG tablet Take 10 mg by mouth 2 (two) times daily.     DULoxetine (CYMBALTA) 60 MG capsule Take 1 capsule (60 mg total) by mouth daily. 30 capsule 1   hydrOXYzine (VISTARIL) 25 MG capsule TAKE 1 CAPSULE TWICE DAILY AS NEEDED FOR ANXIETY. 60 capsule 1   nicotine (NICODERM CQ - DOSED IN MG/24 HOURS) 21 mg/24hr patch Place 21 mg onto the skin daily.     ondansetron (ZOFRAN) 4 MG tablet Take 1 tablet (4 mg total) by mouth every 6 (six) hours as needed for nausea. 20 tablet 0   pregabalin (LYRICA) 75 MG capsule Take 75 mg by mouth 3 (three) times daily.     risperidone (RISPERDAL) 4 MG tablet Take at night 30 tablet 1   rosuvastatin (CRESTOR) 10 MG tablet Take 1 tablet (10 mg total) by mouth daily. 90 tablet 1   telmisartan (MICARDIS) 20 MG tablet TAKE (1) TABLET BY MOUTH ONCE A DAY. 90 tablet 0   traMADol (ULTRAM) 50 MG tablet Take 50 mg by mouth 4 (four) times daily.     No current facility-administered medications for this visit.      Psychiatric  Specialty Exam: Review of Systems  Cardiovascular:   Negative for chest pain.  Psychiatric/Behavioral:  Negative for agitation, hallucinations and self-injury.     There were no vitals taken for this visit.There is no height or weight on file to calculate BMI.  General Appearance: casual  Eye Contact: fair  Speech: clear  Volume:  Normal  Mood:  Euthymic  Affectfair  Thought Process:  Coherent  Orientation:  Full (Time, Place, and Person)  Thought Content:  Rumination  Suicidal Thoughts:  No  Homicidal Thoughts:  No  Memory:  Immediate;   Fair Recent;   Fair  Judgement:  Other:  limited  Insight:  Shallow  Psychomotor Activity:  Normal  Concentration:  Concentration: Fair and Attention Span: Fair  Recall:  [employer] of Knowledge:Fair  Language: Fair  Akathisia:  No  Handed:    AIMS (if indicated):no involuntary movements noteiceable  Assets:  Desire for Improvement  ADL's:  Intact  Cognition: WNL  Sleep:  Fair   Screenings: AIMS    Flowsheet Row [facility]  AIMS Total Score 0      AUDIT    Flowsheet Row [facility]  Alcohol Use Disorder Identification Test Final Score (AUDIT) 0      GAD-7    Flowsheet Row Counsel[facility]  Total GAD-7 Score 4      PHQ2-9    Flowsheet Row Office Visit from 05/12/2022 in [facility] Office Visit from 03/31/2022 in [facility] Office Visit from 02/08/2022 in [facility] Office Visit from 01/20/2022 in [facility] Office Visit from 01/08/2022 in [facility]  PHQ-2 Total Score 0 0 0 0 0      Flowsheet Row Video Visit from 07/26/2022 [facility] [place] Video Visit from 06/10/2022 in [facility] [place] Video Visit from 02/24/2022 in [facility] [place]  C-SSRS RISK CATEGORY No Risk No Risk No Risk       Assessment and Plan: as follows   Prior documentation reviewed   Bipolar current episode manic with psychosis:doing fair, understands risperdal has helped stability, will keep lower dose of 2mg  or half of 4mg  for another month or 60 days. While gaba is being adjusted, other options discussed but considering his stability and being off drugs and hospital will keep lower risperdal dose for now and evlauate Remains to have some paranoia  Avoid klonopine  Adjustment insomnia  : fair, reviewed sleep hygiene,   No tremors  Anxiety disorder unspecified ; fluctuates, continue cymbalta, gabapentin    Fu 68m or earlier if needed   , MD 7/31/20234:17 PM

## 2022-08-23 ENCOUNTER — Telehealth (HOSPITAL_COMMUNITY): Payer: Self-pay

## 2022-08-23 NOTE — Telephone Encounter (Signed)
Patient would like for you to call him something in for his bipolar. He says he has tapered down the Risperdal because of involuntary tongue movements but needs something for his bipolar. Patient uses [facility] [address] in [place] Last appt 07/31 Next appt 09/27

## 2022-08-24 MED ORDER — ASENAPINE MALEATE 5 MG SL SUBL: 5.0000 mg | SUBLINGUAL_TABLET | Freq: Every day | SUBLINGUAL | 0 refills | Status: DC

## 2022-08-24 NOTE — Telephone Encounter (Signed)
Patient informed. 

## 2022-08-31 ENCOUNTER — Telehealth (HOSPITAL_COMMUNITY): Payer: Self-pay | Admitting: Psychiatry

## 2022-08-31 MED ORDER — RISPERIDONE 2 MG PO TABS: ORAL_TABLET | ORAL | 0 refills | Status: DC

## 2022-08-31 MED ORDER — LAMOTRIGINE 25 MG PO TABS: 25.0000 mg | ORAL_TABLET | Freq: Every day | ORAL | 0 refills | Status: DC

## 2022-08-31 NOTE — Telephone Encounter (Signed)
Called Around to the 2 Rx's on patients Preferred List: [facility]-- Doesn't carry Generic Abilify  [facility]  -- Generic Abilify  30/Day Supply $ 665

## 2022-08-31 NOTE — Telephone Encounter (Signed)
Called & spoke with patient concerning provider note : sent risperdal 2mg  at night to continue for now till seen in next 2  months  Adding lamictal start medication =.  Patient informed that he  has stopped taking risperdal due to involuntary  movement. Stated that he tapered off & that he was told  not to   take any psychotic drugs  per providers @ the pain mgmt clinic

## 2022-08-31 NOTE — Telephone Encounter (Signed)
Pt left a Voicemail.  He tried to pick up the new bipolar medication that we called in at pharmacy (asenapine).  It would cost him over $100 and he can not afford this. Would like something different called in.   [facility].   Next Visit - 9/11 Last Visit - 7/31

## 2022-09-01 NOTE — Telephone Encounter (Signed)
Pt has a Appt on 09/06/22 but he's very anxious about what he was told by the pain mgmt provider  & what you are suggesting. Patient noted that since tapering down on the Risperdal he's going to the bathroom with abdominal issues a lot less & having a lot less tongue movement -- PATIENT REQUESTED TO SPEAK WITH PROVIDER

## 2022-09-02 NOTE — Telephone Encounter (Signed)
I spoke with the patient and told him what Dr. Gilmore Laroche stated in previous message. He stated his understanding.

## 2022-09-06 ENCOUNTER — Encounter (HOSPITAL_COMMUNITY): Payer: Self-pay | Admitting: Psychiatry

## 2022-09-06 ENCOUNTER — Telehealth (INDEPENDENT_AMBULATORY_CARE_PROVIDER_SITE_OTHER): Payer: Medicare HMO | Admitting: Psychiatry

## 2022-09-06 DIAGNOSIS — F5102 Adjustment insomnia: Secondary | ICD-10-CM

## 2022-09-06 DIAGNOSIS — R69 Illness, unspecified: Secondary | ICD-10-CM | POA: Diagnosis not present

## 2022-09-06 DIAGNOSIS — F3112 Bipolar disorder, current episode manic without psychotic features, moderate: Secondary | ICD-10-CM

## 2022-09-06 DIAGNOSIS — R29818 Other symptoms and signs involving the nervous system: Secondary | ICD-10-CM

## 2022-09-06 DIAGNOSIS — F419 Anxiety disorder, unspecified: Secondary | ICD-10-CM | POA: Diagnosis not present

## 2022-09-06 MED ORDER — HYDROXYZINE PAMOATE 25 MG PO CAPS: ORAL_CAPSULE | ORAL | 1 refills | Status: DC

## 2022-09-06 MED ORDER — LAMOTRIGINE 25 MG PO TABS: 25.0000 mg | ORAL_TABLET | Freq: Every day | ORAL | 0 refills | Status: DC

## 2022-09-06 MED ORDER — BUSPIRONE HCL 7.5 MG PO TABS: 7.5000 mg | ORAL_TABLET | Freq: Every day | ORAL | 1 refills | Status: DC

## 2022-09-06 MED ORDER — RISPERIDONE 2 MG PO TABS: ORAL_TABLET | ORAL | 0 refills | Status: DC

## 2022-09-06 NOTE — Progress Notes (Signed)
[facility] Followup visit   Patient Identification: [name] [name] MRN:  [id number] Date of Evaluation:  09/06/2022 Referral Source: [facility] Discharge Chief Complaint:  Follow up bipolar , tongue movements Visit Diagnosis:    ICD-10-CM   1. Bipolar disorder, curr episode manic w/o psychotic features, moderate (HCC)  F31.12     2. Anxiety  F41.9 hydrOXYzine (VISTARIL) 25 MG capsule    3. Adjustment insomnia  F51.02     4. Extrapyramidal symptom  R29.818      Virtual Visit via Video Note  I connected with [name] [name] on 09/06/22 at  3:00 PM EDT by a video enabled telemedicine application and verified that I am speaking with the correct person using two identifiers.  Location: Patient: home Provider: home office   I discussed the limitations of evaluation and management by telemedicine and the availability of in person appointments. The patient expressed understanding and agreed to proceed.     I discussed the assessment and treatment plan with the patient. The patient was provided an opportunity to ask questions and all were answered. The patient agreed with the plan and demonstrated an understanding of the instructions.   The patient was advised to call back or seek an in-person evaluation if the symptoms worsen or if the condition fails to improve as anticipated.  I provided 15 - 20 min of non-face-to-face time during this encounter.    History of Present Illness:  Patient initially referred from discharge during April 2021 admission Admitted with mania and psychosis, in past , non compliance and alcohol use   Has called earlier as was having tongue movements and is on risperdal, we have cut dose down, felt more alert Primary care has increased gabapentin . Lyric made him to gain weight Have sent lamictal but he didn't pick up yet  Overall doing better, anxiety manageable better on buspar, vistaril and cymbalta  Also on gabapentin which dose incresed now to 600mg  tid  Is off  risperdal, tongue movements are better and improving Saphris was too expensive, coudnt tolerate seroquel in the past  Has gone unstable in the past without second generation antipsychotics Has now kept away from drugs    Aggravating factors; drug use per history , non compliance history, knee pain and medical co morbidities Modifying factors; mother, wife Duration: adult life Severity fair but fears decompensation  Past Psychiatric History: bipolar disorder  Previous Psychotropic Medications: Yes   Substance Abuse History in the last 12 months:  No.  Consequences of Substance Abuse: not gives clear history of using drugs or minimizes use  Past Medical History:  Past Medical History:  Diagnosis Date   Anxiety    Bipolar 1 disorder (HCC)    Chronic fatigue    Chronic pain    Colostomy in place (HCC) 09/27/2019   Complete intestinal obstruction (HCC)    Depression    Phreesia 12/03/2020   Fecal peritonitis (HCC) 08/03/2019   Fibromyalgia    Hernia of abdominal wall    Hyperlipidemia    Phreesia 12/03/2020   Hypertension    Ileus, postoperative (HCC)    Perforated rectum (HCC) 08/03/2019   Rectal perforation (HCC) 12/23/2019   Sleep apnea     Past Surgical History:  Procedure Laterality Date   COLON RESECTION SIGMOID  08/03/2019   Procedure: COLON RESECTION SIGMOID;  Surgeon: 10/03/2019, MD;  Location: [facility];  Service: General;;   COLONOSCOPY WITH PROPOFOL N/A 10/21/2021   Procedure: COLONOSCOPY WITH PROPOFOL;  Surgeon: 10/23/2021,  Joline Maxcy, MD;  Location: [facility];  Service: Endoscopy;  Laterality: N/A;  955   COLOSTOMY N/A 08/03/2019   Procedure: COLOSTOMY;  Surgeon: Lucretia Roers, MD;  Location: [facility];  Service: General;  Laterality: N/A;   COLOSTOMY REVERSAL N/A 12/25/2021   Procedure: COLOSTOMY REVERSAL;  Surgeon: Lucretia Roers, MD;  Location: [facility];  Service: General;  Laterality: N/A;   FLEXIBLE SIGMOIDOSCOPY N/A 08/03/2019   Procedure: FLEXIBLE  SIGMOIDOSCOPY;  Surgeon: Corbin Ade, MD;  Location: [facility];  Service: Endoscopy;  Laterality: N/A;   INCISIONAL HERNIA REPAIR N/A 12/25/2021   Procedure: INCISIONAL HERNIA REPAIR WITH ABSORBABLE MESH;  Surgeon: Lucretia Roers, MD;  Location: [facility];  Service: General;  Laterality: N/A;   KNEE ARTHROSCOPY Left 2006   miniscus tear   LAPAROTOMY N/A 08/03/2019   Procedure: EXPLORATORY LAPAROTOMY;  Surgeon: Lucretia Roers, MD;  Location: [facility];  Service: General;  Laterality: N/A;   LYMPH GLAND EXCISION Left 1983   PARASTOMAL HERNIA REPAIR Left 12/25/2021   Procedure: PARASTOMAL HERNIA REPAIR WITH ABSORBABLE MESH;  Surgeon: Lucretia Roers, MD;  Location: [facility];  Service: General;  Laterality: Left;   SMALL INTESTINE SURGERY N/A    Phreesia 12/03/2020   TONSILLECTOMY      Family Psychiatric History: denies  Family History:  Family History  Problem Relation Age of Onset   Diabetes Mother    Heart attack Father    Diabetes Maternal Grandmother    Colon cancer Neg Hx    Colon polyps Neg Hx     Social History:   Social History   Socioeconomic History   Marital status: Married    Spouse name: [name]   Number of children: 0   Years of education: 12   Highest education level: Some college, no degree  Occupational History   Not on file  Tobacco Use   Smoking status: Every Day    Packs/day: 1.00    Years: 10.00    Total pack years: 10.00    Types: Cigarettes   Smokeless tobacco: Never  Vaping Use   Vaping Use: Never used  Substance and Sexual Activity   Alcohol use: Not Currently    Comment: denied 10/02/19   Drug use: Not Currently    Types: Cocaine    Comment: last used in December 2020.    Sexual activity: Not Currently  Other Topics Concern   Not on file  Social History Narrative   Pt lives alone and is on disability. Counts his mother, sister and aunt as his social supports.    Social Determinants of Health   Financial Resource Strain: Low  Risk  (12/30/2021)   Overall Financial Resource Strain (CARDIA)    Difficulty of Paying Living Expenses: Not hard at all  Food Insecurity: No Food Insecurity (12/30/2021)   Hunger Vital Sign    Worried About Running Out of Food in the Last Year: Never true    Ran Out of Food in the Last Year: Never true  Transportation Needs: Unmet Transportation Needs (12/30/2021)   PRAPARE - Administrator, Civil Service (Medical): Not on file    Lack of Transportation (Non-Medical): Yes  Physical Activity: Inactive (12/30/2021)   Exercise Vital Sign    Days of Exercise per Week: 0 days    Minutes of Exercise per Session: 0 min  Stress: No Stress Concern Present (12/30/2021)   [employer] of Occupational Health - Occupational Stress Questionnaire  Feeling of Stress : Only a little  Social Connections: Moderately Integrated (12/30/2021)   Social Connection and Isolation Panel [NHANES]    Frequency of Communication with Friends and Family: More than three times a week    Frequency of Social Gatherings with Friends and Family: Once a week    Attends Religious Services: 1 to 4 times per year    Active Member of [employer] or Organizations: No    Attends Banker Meetings: Never    Marital Status: Married     Allergies:   Allergies  Allergen Reactions   Codeine Shortness Of Breath and Swelling   Divalproex Sodium Diarrhea, Itching, Rash, Shortness Of Breath and Swelling   Erythromycin Hives, Itching, Rash and Swelling   Iodinated Contrast Media Swelling    Patient states he received "red Dye" 15-20 years ago and become red/flushed. No other symptoms. Patient did do 13 hour pre meds for CT 09/17/2021   Latex Itching and Rash   Morphine And Related Shortness Of Breath and Swelling   Sulfa Antibiotics Diarrhea, Itching, Rash, Shortness Of Breath and Swelling   Wheat Bran Hives, Itching, Rash, Shortness Of Breath and Swelling   Demerol [Meperidine]     Body over heats and pouring sweat    Levofloxacin     Pt does not recall reaction    Niacin And Related Rash   Penicillins Swelling and Rash    REACTION: Rash and facial swelling at age 54    Metabolic Disorder Labs: Lab Results  Component Value Date   HGBA1C 6.3 (H) 05/12/2022   MPG 114.02 12/23/2021   MPG 108.28 04/07/2021   No results found for: "PROLACTIN" Lab Results  Component Value Date   CHOL 238 (H) 05/12/2022   TRIG 417 (H) 05/12/2022   HDL 30 (L) 05/12/2022   CHOLHDL 7.9 (H) 05/12/2022   VLDL 32 04/09/2021   LDLCALC 132 (H) 05/12/2022   LDLCALC 92 06/01/2021   Lab Results  Component Value Date   TSH 1.490 05/12/2022    Therapeutic Level Labs: Lab Results  Component Value Date   LITHIUM <0.06 (L) 09/20/2019   Lab Results  Component Value Date   CBMZ 6.7 04/19/2021   No results found for: "VALPROATE"  Current Medications: Current Outpatient Medications  Medication Sig Dispense Refill   acetaminophen (TYLENOL) 500 MG tablet Take 500 mg by mouth every 6 (six) hours as needed for moderate pain.     busPIRone (BUSPAR) 7.5 MG tablet Take 1 tablet (7.5 mg total) by mouth daily. 30 tablet 1   cyclobenzaprine (FLEXERIL) 10 MG tablet Take 10 mg by mouth 2 (two) times daily.     DULoxetine (CYMBALTA) 60 MG capsule Take 1 capsule (60 mg total) by mouth daily. 30 capsule 1   gabapentin (NEURONTIN) 600 MG tablet Take 600 mg by mouth every 8 (eight) hours.     hydrOXYzine (VISTARIL) 25 MG capsule TAKE 1 CAPSULE TWICE DAILY AS NEEDED FOR ANXIETY. 60 capsule 1   lamoTRIgine (LAMICTAL) 25 MG tablet Take 1 tablet (25 mg total) by mouth daily. Take one tablet daily for a week and then start taking 2 tablets. 60 tablet 0   nicotine (NICODERM CQ - DOSED IN MG/24 HOURS) 21 mg/24hr patch Place 21 mg onto the skin daily.     ondansetron (ZOFRAN) 4 MG tablet Take 1 tablet (4 mg total) by mouth every 6 (six) hours as needed for nausea. 20 tablet 0   rosuvastatin (CRESTOR) 10 MG tablet Take 1  tablet (10 mg total)  by mouth daily. 90 tablet 1   telmisartan (MICARDIS) 20 MG tablet TAKE (1) TABLET BY MOUTH ONCE A DAY. 90 tablet 0   traMADol (ULTRAM) 50 MG tablet Take 50 mg by mouth 4 (four) times daily.     No current facility-administered medications for this visit.      Psychiatric Specialty Exam: Review of Systems  Cardiovascular:  Negative for chest pain.  Psychiatric/Behavioral:  Negative for agitation, hallucinations and self-injury.     There were no vitals taken for this visit.There is no height or weight on file to calculate BMI.  General Appearance: casual  Eye Contact: fair  Speech: clear  Volume:  Normal  Mood:  Euthymic  Affectfair  Thought Process:  Coherent  Orientation:  Full (Time, Place, and Person)  Thought Content:  Rumination  Suicidal Thoughts:  No  Homicidal Thoughts:  No  Memory:  Immediate;   Fair Recent;   Fair  Judgement:  Other:  limited  Insight:  Shallow  Psychomotor Activity:  Normal  Concentration:  Concentration: Fair and Attention Span: Fair  Recall:  [employer] of Knowledge:Fair  Language: Fair  Akathisia:  No  Handed:    AIMS (if indicated):no involuntary movements noteiceable  Assets:  Desire for Improvement  ADL's:  Intact  Cognition: WNL  Sleep:  Fair   Screenings: AIMS    Flowsheet Row [facility]  AIMS Total Score 0      AUDIT    Flowsheet Row Ad[facility]  Alcohol Use Disorder Identification Test Final Score (AUDIT) 0      GAD-7    Flowsheet Row Counsel[facility]  Total GAD-7 Score 4      PHQ2-9    Flowsheet Row Office Visit from 05/12/2022 in [facility] Office Visit from 03/31/2022 in [facility] Office Visit from 02/08/2022 in [facility] Office Visit from 01/20/2022 in  [facility] Office Visit from 01/08/2022 in [facility]  PHQ-2 Total Score 0 0 0 0 0      Flowsheet Row Video [facility]06[facility]2022 [facility] [place] Video Visit from 07/26/2022 in [facility] [place] Video Visit from 06/10/2022 in [facility] [place]  C-SSRS RISK CATEGORY No Risk No Risk No Risk       Assessment and Plan: as follows   Prior documentation reviewed   Bipolar current episode manic with psychosis: Doing fair will start lamictal for bipolar, rash explained, keep off risperdal Not paranoid   Avoid klonopine  Adjustment insomnia  : fair work on sleep hygiene, takes vistaril if needed at night  Anxiety disorder unspecified ;manageable continue cymbalta, also on gabapentin from primary care  Also takes buspar not regularly EPS; improved, not on risperdal      Fu 33m or earlier if needed   Thresa Ross, MD 9/11/20233:16 PM

## 2022-09-07 ENCOUNTER — Other Ambulatory Visit (HOSPITAL_COMMUNITY): Payer: Self-pay

## 2022-09-07 MED ORDER — LAMOTRIGINE 25 MG PO TABS: 25.0000 mg | ORAL_TABLET | Freq: Every day | ORAL | 0 refills | Status: DC

## 2022-09-10 ENCOUNTER — Ambulatory Visit: Payer: Medicare HMO | Admitting: Internal Medicine

## 2022-09-13 ENCOUNTER — Ambulatory Visit (INDEPENDENT_AMBULATORY_CARE_PROVIDER_SITE_OTHER): Payer: Medicare HMO | Admitting: Internal Medicine

## 2022-09-13 ENCOUNTER — Encounter: Payer: Self-pay | Admitting: Internal Medicine

## 2022-09-13 ENCOUNTER — Ambulatory Visit: Payer: Medicare HMO | Admitting: Internal Medicine

## 2022-09-13 VITALS — BP 166/98 | HR 88 | Ht 71.0 in | Wt 259.2 lb

## 2022-09-13 DIAGNOSIS — E782 Mixed hyperlipidemia: Secondary | ICD-10-CM

## 2022-09-13 DIAGNOSIS — E1169 Type 2 diabetes mellitus with other specified complication: Secondary | ICD-10-CM

## 2022-09-13 DIAGNOSIS — M1711 Unilateral primary osteoarthritis, right knee: Secondary | ICD-10-CM | POA: Diagnosis not present

## 2022-09-13 DIAGNOSIS — E119 Type 2 diabetes mellitus without complications: Secondary | ICD-10-CM | POA: Insufficient documentation

## 2022-09-13 DIAGNOSIS — Z23 Encounter for immunization: Secondary | ICD-10-CM | POA: Diagnosis not present

## 2022-09-13 DIAGNOSIS — R69 Illness, unspecified: Secondary | ICD-10-CM | POA: Diagnosis not present

## 2022-09-13 DIAGNOSIS — F312 Bipolar disorder, current episode manic severe with psychotic features: Secondary | ICD-10-CM | POA: Diagnosis not present

## 2022-09-13 DIAGNOSIS — I10 Essential (primary) hypertension: Secondary | ICD-10-CM | POA: Diagnosis not present

## 2022-09-13 DIAGNOSIS — G609 Hereditary and idiopathic neuropathy, unspecified: Secondary | ICD-10-CM

## 2022-09-13 LAB — POCT GLYCOSYLATED HEMOGLOBIN (HGB A1C)
HbA1c POC (<> result, manual entry): 6.7 % (ref 4.0–5.6)
HbA1c, POC (controlled diabetic range): 6.7 % (ref 0.0–7.0)

## 2022-09-13 MED ORDER — ROSUVASTATIN CALCIUM 10 MG PO TABS: 10.0000 mg | ORAL_TABLET | Freq: Every day | ORAL | 1 refills | Status: DC

## 2022-09-13 MED ORDER — METFORMIN HCL 500 MG PO TABS: 500.0000 mg | ORAL_TABLET | Freq: Every day | ORAL | 1 refills | Status: DC

## 2022-09-13 MED ORDER — TELMISARTAN 40 MG PO TABS: 40.0000 mg | ORAL_TABLET | Freq: Every day | ORAL | 2 refills | Status: DC

## 2022-09-13 NOTE — Assessment & Plan Note (Addendum)
Has had inpatient Psychiatric treatment for acute mania Appears to be more stable now Now on Lamictal, Atarax, BuSpar and Cymbalta - explained to the patient about importance of compliance to the treatment. Follow up with [facility] therapist and Psychiatry

## 2022-09-13 NOTE — Assessment & Plan Note (Addendum)
Well-controlled On Gabapentin 600 mg q8h and Cymbalta 60 mg QD Followed by [facility]

## 2022-09-13 NOTE — Assessment & Plan Note (Signed)
BMI Readings from Last 1 Encounters:  09/13/22 36.15 kg/m   With HLD, depression and chronic pain Diet modification and moderate exercise advised

## 2022-09-13 NOTE — Progress Notes (Signed)
Established Patient Office Visit  Subjective:  Patient ID: [name] [name], male    DOB: 1966/02/01  Age: 56 y.o. MRN: [id number]  CC:  Chief Complaint  Patient presents with   Follow-up    Follow up HTN patient has been out of bp meds for 3 days lost medication in a move     HPI [name] [name] is a 56 y.o. male with past medical history of bipolar disorder, chronic pain syndrome, polysubstance abuse, and perforated rectum s/p partial colectomy and colostomy who presents for f/u of his chronic medical conditions.  HTN: His BP was elevated today as he had lost his telmisartan while moving.  He denies any headache, dizziness, chest pain, dyspnea or palpitations.  Type II DM with HLD: His HbA1c has increased to 6.7 now.  He has been trying to follow better diet to avoid weight gain, but has gained about 27 lbs in the last year.  He denies any polyuria or polyphagia currently.  He has history of peripheral neuropathy and takes gabapentin and Cymbalta for it.  He also takes tramadol as needed for chronic pain.  He has not started taking Crestor yet.  He reports chronic right knee pain, for which he has seen orthopedic surgeon at [facility] clinic.  He was advised to get TKA.     Past Medical History:  Diagnosis Date   Anxiety    Bipolar 1 disorder ([place])    Chronic fatigue    Chronic pain    Colostomy in place [facility]) 09/27/2019   Complete intestinal obstruction ([place])    Depression    Phreesia 12/03/2020   Fecal peritonitis ([place]) 08/03/2019   Fibromyalgia    Hernia of abdominal wall    Hyperlipidemia    Phreesia 12/03/2020   Hypertension    Ileus, postoperative (HCC)    Perforated rectum ([place]) 08/03/2019   Rectal perforation ([place]) 12/23/2019   Sleep apnea     Past Surgical History:  Procedure Laterality Date   COLON RESECTION SIGMOID  08/03/2019   Procedure: COLON RESECTION SIGMOID;  Surgeon: Virl Cagey, MD;  Location: [facility];  Service: General;;   COLONOSCOPY WITH  PROPOFOL N/A 10/21/2021   Procedure: COLONOSCOPY WITH PROPOFOL;  Surgeon: Rogene Houston, MD;  Location: [facility];  Service: Endoscopy;  Laterality: N/A;  955   COLOSTOMY N/A 08/03/2019   Procedure: COLOSTOMY;  Surgeon: Virl Cagey, MD;  Location: [facility];  Service: General;  Laterality: N/A;   COLOSTOMY REVERSAL N/A 12/25/2021   Procedure: COLOSTOMY REVERSAL;  Surgeon: Virl Cagey, MD;  Location: [facility];  Service: General;  Laterality: N/A;   FLEXIBLE SIGMOIDOSCOPY N/A 08/03/2019   Procedure: FLEXIBLE SIGMOIDOSCOPY;  Surgeon: Daneil Dolin, MD;  Location: [facility];  Service: Endoscopy;  Laterality: N/A;   INCISIONAL HERNIA REPAIR N/A 12/25/2021   Procedure: INCISIONAL HERNIA REPAIR WITH ABSORBABLE MESH;  Surgeon: Virl Cagey, MD;  Location: [facility];  Service: General;  Laterality: N/A;   KNEE ARTHROSCOPY Left 2006   miniscus tear   LAPAROTOMY N/A 08/03/2019   Procedure: EXPLORATORY LAPAROTOMY;  Surgeon: Virl Cagey, MD;  Location: [facility];  Service: General;  Laterality: N/A;   LYMPH GLAND EXCISION Left 1983   PARASTOMAL HERNIA REPAIR Left 12/25/2021   Procedure: PARASTOMAL HERNIA REPAIR WITH ABSORBABLE MESH;  Surgeon: Virl Cagey, MD;  Location: [facility];  Service: General;  Laterality: Left;   SMALL INTESTINE SURGERY N/A    Phreesia 12/03/2020  TONSILLECTOMY      Family History  Problem Relation Age of Onset   Diabetes Mother    Heart attack Father    Diabetes Maternal Grandmother    Colon cancer Neg Hx    Colon polyps Neg Hx     Social History   Socioeconomic History   Marital status: Married    Spouse name: Levada Dy   Number of children: 0   Years of education: 12   Highest education level: Some college, no degree  Occupational History   Not on file  Tobacco Use   Smoking status: Every Day    Packs/day: 1.00    Years: 10.00    Total pack years: 10.00    Types: Cigarettes   Smokeless tobacco: Never  Vaping Use   Vaping Use:  Never used  Substance and Sexual Activity   Alcohol use: Not Currently    Comment: denied 10/02/19   Drug use: Not Currently    Types: Cocaine    Comment: last used in December 2020.    Sexual activity: Not Currently  Other Topics Concern   Not on file  Social History Narrative   Pt lives alone and is on disability. Counts his mother, sister and aunt as his social supports.    Social Determinants of Health   Financial Resource Strain: Low Risk  (12/30/2021)   Overall Financial Resource Strain (CARDIA)    Difficulty of Paying Living Expenses: Not hard at all  Food Insecurity: No Food Insecurity (12/30/2021)   Hunger Vital Sign    Worried About Running Out of Food in the Last Year: Never true    Ran Out of Food in the Last Year: Never true  Transportation Needs: Unmet Transportation Needs (12/30/2021)   PRAPARE - Hydrologist (Medical): Not on file    Lack of Transportation (Non-Medical): Yes  Physical Activity: Inactive (12/30/2021)   Exercise Vital Sign    Days of Exercise per Week: 0 days    Minutes of Exercise per Session: 0 min  Stress: No Stress Concern Present (12/30/2021)   [place]    Feeling of Stress : Only a little  Social Connections: Moderately Integrated (12/30/2021)   Social Connection and Isolation Panel [NHANES]    Frequency of Communication with Friends and Family: More than three times a week    Frequency of Social Gatherings with Friends and Family: Once a week    Attends Religious Services: 1 to 4 times per year    Active Member of [employer] or Organizations: No    Attends Archivist Meetings: Never    Marital Status: Married  Human resources officer Violence: Not At Risk (12/30/2021)   Humiliation, Afraid, Rape, and Kick questionnaire    Fear of Current or Ex-Partner: No    Emotionally Abused: No    Physically Abused: No    Sexually Abused: No    Outpatient Medications  Prior to Visit  Medication Sig Dispense Refill   acetaminophen (TYLENOL) 500 MG tablet Take 500 mg by mouth every 6 (six) hours as needed for moderate pain.     busPIRone (BUSPAR) 7.5 MG tablet Take 1 tablet (7.5 mg total) by mouth daily. 30 tablet 1   DULoxetine (CYMBALTA) 60 MG capsule Take 1 capsule (60 mg total) by mouth daily. 30 capsule 1   gabapentin (NEURONTIN) 600 MG tablet Take 600 mg by mouth every 8 (eight) hours.     hydrOXYzine (VISTARIL)  25 MG capsule TAKE 1 CAPSULE TWICE DAILY AS NEEDED FOR ANXIETY. 60 capsule 1   lamoTRIgine (LAMICTAL) 25 MG tablet Take 1 tablet (25 mg total) by mouth daily. Take one tablet daily for a week and then start taking 2 tablets. 60 tablet 0   traMADol (ULTRAM) 50 MG tablet Take 50 mg by mouth 4 (four) times daily.     cyclobenzaprine (FLEXERIL) 10 MG tablet Take 10 mg by mouth 2 (two) times daily. (Patient not taking: Reported on 09/13/2022)     nicotine (NICODERM CQ - DOSED IN MG/24 HOURS) 21 mg/24hr patch Place 21 mg onto the skin daily. (Patient not taking: Reported on 09/13/2022)     ondansetron (ZOFRAN) 4 MG tablet Take 1 tablet (4 mg total) by mouth every 6 (six) hours as needed for nausea. (Patient not taking: Reported on 09/13/2022) 20 tablet 0   rosuvastatin (CRESTOR) 10 MG tablet Take 1 tablet (10 mg total) by mouth daily. (Patient not taking: Reported on 09/13/2022) 90 tablet 1   telmisartan (MICARDIS) 20 MG tablet TAKE (1) TABLET BY MOUTH ONCE A DAY. (Patient not taking: Reported on 09/13/2022) 90 tablet 0   No facility-administered medications prior to visit.    Allergies  Allergen Reactions   Codeine Shortness Of Breath and Swelling   Divalproex Sodium Diarrhea, Itching, Rash, Shortness Of Breath and Swelling   Erythromycin Hives, Itching, Rash and Swelling   Iodinated Contrast Media Swelling    Patient states he received "red Dye" 15-20 years ago and become red/flushed. No other symptoms. Patient did do 13 hour pre meds for CT 09/17/2021    Latex Itching and Rash   Morphine And Related Shortness Of Breath and Swelling   Sulfa Antibiotics Diarrhea, Itching, Rash, Shortness Of Breath and Swelling   Wheat Bran Hives, Itching, Rash, Shortness Of Breath and Swelling   Demerol [Meperidine]     Body over heats and pouring sweat   Levofloxacin     Pt does not recall reaction    Niacin And Related Rash   Penicillins Swelling and Rash    REACTION: Rash and facial swelling at age 42    ROS Review of Systems  Constitutional:  Negative for chills and fever.  HENT:  Negative for congestion and sore throat.   Eyes:  Negative for pain and discharge.  Respiratory:  Negative for cough and shortness of breath.   Cardiovascular:  Negative for chest pain and palpitations.  Gastrointestinal:  Negative for constipation, diarrhea, nausea and vomiting.  Endocrine: Negative for polydipsia and polyuria.  Genitourinary:  Negative for dysuria and hematuria.  Musculoskeletal:  Positive for arthralgias, back pain and myalgias. Negative for neck pain and neck stiffness.  Skin:  Negative for rash.  Neurological:  Negative for dizziness, weakness, numbness and headaches.  Psychiatric/Behavioral:  Positive for decreased concentration and sleep disturbance. Negative for agitation and behavioral problems. The patient is nervous/anxious.       Objective:    Physical Exam Vitals reviewed.  Constitutional:      General: He is not in acute distress.    Appearance: He is not diaphoretic.  HENT:     Head: Normocephalic.     Nose: Nose normal.     Mouth/Throat:     Mouth: Mucous membranes are moist.     Dentition: Abnormal dentition.  Eyes:     General: No scleral icterus.    Extraocular Movements: Extraocular movements intact.  Cardiovascular:     Rate and Rhythm: Normal rate and regular  rhythm.     Pulses: Normal pulses.     Heart sounds: Normal heart sounds. No murmur heard. Pulmonary:     Breath sounds: Normal breath sounds. No wheezing  or rales.  Musculoskeletal:     Cervical back: Neck supple. No tenderness.     Right lower leg: No edema.     Left lower leg: No edema.     Comments: Right knee orthotic present  Skin:    General: Skin is warm.     Findings: No rash.  Neurological:     General: No focal deficit present.     Mental Status: He is alert and oriented to person, place, and time.     Sensory: No sensory deficit.     Motor: No weakness.  Psychiatric:        Mood and Affect: Mood is anxious.        Behavior: Behavior is hyperactive.        Thought Content: Thought content does not include homicidal or suicidal ideation.     BP (!) 166/98 (BP Location: Right Arm, Cuff Size: Normal)   Pulse 88   Ht _0  (1.803 m)   Wt 259 lb 3.2 oz (117.6 kg)   SpO2 97%   BMI 36.15 kg/m  Wt Readings from Last 3 Encounters:  09/13/22 259 lb 3.2 oz (117.6 kg)  05/12/22 256 lb 9.6 oz (116.4 kg)  03/16/22 248 lb (112.5 kg)    Lab Results  Component Value Date   TSH 1.490 05/12/2022   Lab Results  Component Value Date   WBC 11.0 (H) 05/12/2022   HGB 15.1 05/12/2022   HCT 44.2 05/12/2022   MCV 80 05/12/2022   PLT 304 05/12/2022   Lab Results  Component Value Date   NA 135 05/12/2022   K 4.4 05/12/2022   CO2 21 05/12/2022   GLUCOSE 141 (H) 05/12/2022   BUN 11 05/12/2022   CREATININE 1.03 05/12/2022   BILITOT 0.3 05/12/2022   ALKPHOS 124 (H) 05/12/2022   AST 14 05/12/2022   ALT 15 05/12/2022   PROT 7.1 05/12/2022   ALBUMIN 4.2 05/12/2022   CALCIUM 9.0 05/12/2022   ANIONGAP 8 12/28/2021   EGFR 85 05/12/2022   Lab Results  Component Value Date   CHOL 238 (H) 05/12/2022   Lab Results  Component Value Date   HDL 30 (L) 05/12/2022   Lab Results  Component Value Date   LDLCALC 132 (H) 05/12/2022   Lab Results  Component Value Date   TRIG 417 (H) 05/12/2022   Lab Results  Component Value Date   CHOLHDL 7.9 (H) 05/12/2022   Lab Results  Component Value Date   HGBA1C 6.7 09/13/2022    HGBA1C 6.7 09/13/2022      Assessment & Plan:   Problem List Items Addressed This Visit       Cardiovascular and Mediastinum   HTN (hypertension)    BP Readings from Last 1 Encounters:  09/13/22 (!) 166/98  Uncontrolled Has run out of telmisartan, but 20 mg would be inadequate for his BP control Increased dose of telmisartan to 40 mg daily Counseled for compliance with the medications Advised DASH diet and moderate exercise/walking, at least 150 mins/week       Relevant Medications   telmisartan (MICARDIS) 40 MG tablet   rosuvastatin (CRESTOR) 10 MG tablet     Endocrine   Diabetes mellitus ([place]) - Primary    Lab Results  Component Value Date   HGBA1C 6.7  09/13/2022   HGBA1C 6.7 09/13/2022   New onset Started metformin 500 mg daily Advised to follow diabetic diet On ARB and statin, needs to be compliant F/u CMP and lipid panel Diabetic eye exam: Advised to follow up with Ophthalmology for diabetic eye exam      Relevant Medications   telmisartan (MICARDIS) 40 MG tablet   rosuvastatin (CRESTOR) 10 MG tablet   metFORMIN (GLUCOPHAGE) 500 MG tablet   Other Relevant Orders   POCT glycosylated hemoglobin (Hb A1C) (Completed)     Nervous and Auditory   Idiopathic peripheral neuropathy    Well-controlled On Gabapentin 600 mg q8h and Cymbalta 60 mg QD Followed by [facility] Neurology        Musculoskeletal and Integument   Osteoarthritis of right knee    Chronic right knee pain Needs TKA, has had orthopedic surgeon eval Continue tramadol for chronic pain, followed by Barton Fanny, NP        Other   Mixed hyperlipidemia    Has started Crestor, but has not started taking it Needs to start Crestor      Relevant Medications   telmisartan (MICARDIS) 40 MG tablet   rosuvastatin (CRESTOR) 10 MG tablet   Bipolar affective disorder ([place])    Has had inpatient Psychiatric treatment for acute mania Appears to be more stable now Now on Lamictal, Atarax, BuSpar and  Cymbalta - explained to the patient about importance of compliance to the treatment. Follow up with [facility] therapist and Psychiatry      Morbid obesity ([place])    BMI Readings from Last 1 Encounters:  09/13/22 36.15 kg/m  With HLD, depression and chronic pain Diet modification and moderate exercise advised       Relevant Medications   metFORMIN (GLUCOPHAGE) 500 MG tablet   Other Visit Diagnoses     Need for immunization against influenza       Relevant Orders   Flu Vaccine QUAD 71moIM (Fluarix, Fluzone & Alfiuria Quad PF) (Completed)       Meds ordered this encounter  Medications   telmisartan (MICARDIS) 40 MG tablet    Sig: Take 1 tablet (40 mg total) by mouth daily.    Dispense:  30 tablet    Refill:  2    Dose change   rosuvastatin (CRESTOR) 10 MG tablet    Sig: Take 1 tablet (10 mg total) by mouth daily.    Dispense:  90 tablet    Refill:  1   metFORMIN (GLUCOPHAGE) 500 MG tablet    Sig: Take 1 tablet (500 mg total) by mouth daily with breakfast.    Dispense:  90 tablet    Refill:  1    Follow-up: Return in about 4 weeks (around 10/11/2022) for HTN.    RLindell Spar MD

## 2022-09-13 NOTE — Assessment & Plan Note (Signed)
Chronic right knee pain Needs TKA, has had orthopedic surgeon eval Continue tramadol for chronic pain, followed by Barton Fanny, NP

## 2022-09-13 NOTE — Assessment & Plan Note (Signed)
Has started Crestor, but has not started taking it Needs to start Crestor

## 2022-09-13 NOTE — Patient Instructions (Addendum)
Please start taking Telmisartan 40 mg instead of 20 mg.  Please take Rosuvastatin for cholesterol.  Please start taking Metformin once daily for diabetes.  Please follow DASH diet and perform moderate exercise/walking at least 150 mins/week.  Please consider getting Shingrix and Tdap vaccines at local pharmacy.

## 2022-09-13 NOTE — Assessment & Plan Note (Addendum)
BP Readings from Last 1 Encounters:  09/13/22 (!) 166/98   Uncontrolled Has run out of telmisartan, but 20 mg would be inadequate for his BP control Increased dose of telmisartan to 40 mg daily Counseled for compliance with the medications Advised DASH diet and moderate exercise/walking, at least 150 mins/week

## 2022-09-13 NOTE — Assessment & Plan Note (Signed)
Lab Results  Component Value Date   HGBA1C 6.7 09/13/2022   HGBA1C 6.7 09/13/2022    New onset Started metformin 500 mg daily Advised to follow diabetic diet On ARB and statin, needs to be compliant F/u CMP and lipid panel Diabetic eye exam: Advised to follow up with Ophthalmology for diabetic eye exam

## 2022-09-22 ENCOUNTER — Telehealth (HOSPITAL_COMMUNITY): Payer: Medicare HMO | Admitting: Psychiatry

## 2022-10-08 ENCOUNTER — Encounter (HOSPITAL_COMMUNITY): Payer: Self-pay | Admitting: Psychiatry

## 2022-10-08 ENCOUNTER — Telehealth (INDEPENDENT_AMBULATORY_CARE_PROVIDER_SITE_OTHER): Payer: Medicare HMO | Admitting: Psychiatry

## 2022-10-08 DIAGNOSIS — F419 Anxiety disorder, unspecified: Secondary | ICD-10-CM | POA: Diagnosis not present

## 2022-10-08 DIAGNOSIS — F3112 Bipolar disorder, current episode manic without psychotic features, moderate: Secondary | ICD-10-CM

## 2022-10-08 DIAGNOSIS — R29818 Other symptoms and signs involving the nervous system: Secondary | ICD-10-CM

## 2022-10-08 DIAGNOSIS — R69 Illness, unspecified: Secondary | ICD-10-CM | POA: Diagnosis not present

## 2022-10-08 MED ORDER — HYDROXYZINE PAMOATE 25 MG PO CAPS: ORAL_CAPSULE | ORAL | 1 refills | Status: DC

## 2022-10-08 MED ORDER — DULOXETINE HCL 60 MG PO CPEP: 60.0000 mg | ORAL_CAPSULE | Freq: Every day | ORAL | 1 refills | Status: DC

## 2022-10-08 MED ORDER — LAMOTRIGINE 25 MG PO TABS: 50.0000 mg | ORAL_TABLET | Freq: Every day | ORAL | 1 refills | Status: DC

## 2022-10-08 NOTE — Progress Notes (Signed)
[facility] Followup visit   Patient Identification: [name] [name] MRN:  [id number] Date of Evaluation:  10/08/2022 Referral Source: [facility] Discharge Chief Complaint:  Follow up bipolar , tongue movements Visit Diagnosis:    ICD-10-CM   1. Bipolar disorder, curr episode manic w/o psychotic features, moderate (HCC)  F31.12     2. Anxiety  F41.9 hydrOXYzine (VISTARIL) 25 MG capsule    3. Extrapyramidal symptom  R29.818      Virtual Visit via Video Note  I connected with [name] [name] on 10/08/22 at  9:30 AM EDT by a video enabled telemedicine application and verified that I am speaking with the correct person using two identifiers.  Location: Patient:  home Provider: home office   I discussed the limitations of evaluation and management by telemedicine and the availability of in person appointments. The patient expressed understanding and agreed to proceed.     I discussed the assessment and treatment plan with the patient. The patient was provided an opportunity to ask questions and all were answered. The patient agreed with the plan and demonstrated an understanding of the instructions.   The patient was advised to call back or seek an in-person evaluation if the symptoms worsen or if the condition fails to improve as anticipated.  I provided 15 plus minutes of non-face-to-face time during this encounter.    History of Present Illness:  Patient initially referred from discharge during April 2021 admission Admitted with mania and psychosis, in past , non compliance and alcohol use   Has been doing well on lamictal less tongue movements, risperdal discontinued  Following with providers to work on diabetes, weight and healthy habits  Remains sober off drugs and mood more better and alert,  Lamictal small dose no rash  Also on cymbalta, gabapentin     Aggravating factors; drug use per history , non compliance history, knee pain and medical co morbidities Modifying factors; mother,  wife Duration: adult life Severity better  Past Psychiatric History: bipolar disorder  Previous Psychotropic Medications: Yes   Substance Abuse History in the last 12 months:  No.  Consequences of Substance Abuse: not gives clear history of using drugs or minimizes use  Past Medical History:  Past Medical History:  Diagnosis Date   Anxiety    Bipolar 1 disorder (HCC)    Chronic fatigue    Chronic pain    Colostomy in place (HCC) 09/27/2019   Complete intestinal obstruction (HCC)    Depression    Phreesia 12/03/2020   Fecal peritonitis (HCC) 08/03/2019   Fibromyalgia    Hernia of abdominal wall    Hyperlipidemia    Phreesia 12/03/2020   Hypertension    Ileus, postoperative (HCC)    Perforated rectum (HCC) 08/03/2019   Rectal perforation (HCC) 12/23/2019   Sleep apnea     Past Surgical History:  Procedure Laterality Date   COLON RESECTION SIGMOID  08/03/2019   Procedure: COLON RESECTION SIGMOID;  Surgeon: Lucretia Roers, MD;  Location: [facility];  Service: General;;   COLONOSCOPY WITH PROPOFOL N/A 10/21/2021   Procedure: COLONOSCOPY WITH PROPOFOL;  Surgeon: Malissa Hippo, MD;  Location: [facility];  Service: Endoscopy;  Laterality: N/A;  955   COLOSTOMY N/A 08/03/2019   Procedure: COLOSTOMY;  Surgeon: Lucretia Roers, MD;  Location: [facility];  Service: General;  Laterality: N/A;   COLOSTOMY REVERSAL N/A 12/25/2021   Procedure: COLOSTOMY REVERSAL;  Surgeon: Lucretia Roers, MD;  Location: [facility];  Service: General;  Laterality:  N/A;   FLEXIBLE SIGMOIDOSCOPY N/A 08/03/2019   Procedure: FLEXIBLE SIGMOIDOSCOPY;  Surgeon: Corbin Ade, MD;  Location: [facility];  Service: Endoscopy;  Laterality: N/A;   INCISIONAL HERNIA REPAIR N/A 12/25/2021   Procedure: INCISIONAL HERNIA REPAIR WITH ABSORBABLE MESH;  Surgeon: Lucretia Roers, MD;  Location: [facility];  Service: General;  Laterality: N/A;   KNEE ARTHROSCOPY Left 2006   miniscus tear   LAPAROTOMY N/A 08/03/2019    Procedure: EXPLORATORY LAPAROTOMY;  Surgeon: Lucretia Roers, MD;  Location: [facility];  Service: General;  Laterality: N/A;   LYMPH GLAND EXCISION Left 1983   PARASTOMAL HERNIA REPAIR Left 12/25/2021   Procedure: PARASTOMAL HERNIA REPAIR WITH ABSORBABLE MESH;  Surgeon: Lucretia Roers, MD;  Location: [facility];  Service: General;  Laterality: Left;   SMALL INTESTINE SURGERY N/A    Phreesia 12/03/2020   TONSILLECTOMY      Family Psychiatric History: denies  Family History:  Family History  Problem Relation Age of Onset   Diabetes Mother    Heart attack Father    Diabetes Maternal Grandmother    Colon cancer Neg Hx    Colon polyps Neg Hx     Social History:   Social History   Socioeconomic History   Marital status: Married    Spouse name: [name]   Number of children: 0   Years of education: 12   Highest education level: Some college, no degree  Occupational History   Not on file  Tobacco Use   Smoking status: Every Day    Packs/day: 1.00    Years: 10.00    Total pack years: 10.00    Types: Cigarettes   Smokeless tobacco: Never  Vaping Use   Vaping Use: Never used  Substance and Sexual Activity   Alcohol use: Not Currently    Comment: denied 10/02/19   Drug use: Not Currently    Types: Cocaine    Comment: last used in December 2020.    Sexual activity: Not Currently  Other Topics Concern   Not on file  Social History Narrative   Pt lives alone and is on disability. Counts his mother, sister and aunt as his social supports.    Social Determinants of Health   Financial Resource Strain: Low Risk  (12/30/2021)   Overall Financial Resource Strain (CARDIA)    Difficulty of Paying Living Expenses: Not hard at all  Food Insecurity: No Food Insecurity (12/30/2021)   Hunger Vital Sign    Worried About Running Out of Food in the Last Year: Never true    Ran Out of Food in the Last Year: Never true  Transportation Needs: Unmet Transportation Needs (12/30/2021)   PRAPARE -  Administrator, Civil Service (Medical): Not on file    Lack of Transportation (Non-Medical): Yes  Physical Activity: Inactive (12/30/2021)   Exercise Vital Sign    Days of Exercise per Week: 0 days    Minutes of Exercise per Session: 0 min  Stress: No Stress Concern Present (12/30/2021)   [employer] of Occupational Health - Occupational Stress Questionnaire    Feeling of Stress : Only a little  Social Connections: Moderately Integrated (12/30/2021)   Social Connection and Isolation Panel [NHANES]    Frequency of Communication with Friends and Family: More than three times a week    Frequency of Social Gatherings with Friends and Family: Once a week    Attends Religious Services: 1 to 4 times per year  Active Member of Clubs or Organizations: No    Attends Banker Meetings: Never    Marital Status: Married     Allergies:   Allergies  Allergen Reactions   Codeine Shortness Of Breath and Swelling   Divalproex Sodium Diarrhea, Itching, Rash, Shortness Of Breath and Swelling   Erythromycin Hives, Itching, Rash and Swelling   Iodinated Contrast Media Swelling    Patient states he received "red Dye" 15-20 years ago and become red/flushed. No other symptoms. Patient did do 13 hour pre meds for CT 09/17/2021   Latex Itching and Rash   Morphine And Related Shortness Of Breath and Swelling   Sulfa Antibiotics Diarrhea, Itching, Rash, Shortness Of Breath and Swelling   Wheat Bran Hives, Itching, Rash, Shortness Of Breath and Swelling   Demerol [Meperidine]     Body over heats and pouring sweat   Levofloxacin     Pt does not recall reaction    Niacin And Related Rash   Penicillins Swelling and Rash    REACTION: Rash and facial swelling at age 56    Metabolic Disorder Labs: Lab Results  Component Value Date   HGBA1C 6.7 09/13/2022   HGBA1C 6.7 09/13/2022   MPG 114.02 12/23/2021   MPG 108.28 04/07/2021   No results found for: "PROLACTIN" Lab Results   Component Value Date   CHOL 238 (H) 05/12/2022   TRIG 417 (H) 05/12/2022   HDL 30 (L) 05/12/2022   CHOLHDL 7.9 (H) 05/12/2022   VLDL 32 04/09/2021   LDLCALC 132 (H) 05/12/2022   LDLCALC 92 06/01/2021   Lab Results  Component Value Date   TSH 1.490 05/12/2022    Therapeutic Level Labs: Lab Results  Component Value Date   LITHIUM <0.06 (L) 09/20/2019   Lab Results  Component Value Date   CBMZ 6.7 04/19/2021   No results found for: "VALPROATE"  Current Medications: Current Outpatient Medications  Medication Sig Dispense Refill   acetaminophen (TYLENOL) 500 MG tablet Take 500 mg by mouth every 6 (six) hours as needed for moderate pain.     busPIRone (BUSPAR) 7.5 MG tablet Take 1 tablet (7.5 mg total) by mouth daily. 30 tablet 1   cyclobenzaprine (FLEXERIL) 10 MG tablet Take 10 mg by mouth 2 (two) times daily. (Patient not taking: Reported on 09/13/2022)     DULoxetine (CYMBALTA) 60 MG capsule Take 1 capsule (60 mg total) by mouth daily. 30 capsule 1   gabapentin (NEURONTIN) 600 MG tablet Take 600 mg by mouth every 8 (eight) hours.     hydrOXYzine (VISTARIL) 25 MG capsule TAKE 1 CAPSULE TWICE DAILY AS NEEDED FOR ANXIETY. 60 capsule 1   lamoTRIgine (LAMICTAL) 25 MG tablet Take 2 tablets (50 mg total) by mouth daily. Take 2 a day 60 tablet 1   metFORMIN (GLUCOPHAGE) 500 MG tablet Take 1 tablet (500 mg total) by mouth daily with breakfast. 90 tablet 1   rosuvastatin (CRESTOR) 10 MG tablet Take 1 tablet (10 mg total) by mouth daily. 90 tablet 1   telmisartan (MICARDIS) 40 MG tablet Take 1 tablet (40 mg total) by mouth daily. 30 tablet 2   traMADol (ULTRAM) 50 MG tablet Take 50 mg by mouth 4 (four) times daily.     No current facility-administered medications for this visit.      Psychiatric Specialty Exam: Review of Systems  Cardiovascular:  Negative for chest pain.  Psychiatric/Behavioral:  Negative for agitation, hallucinations and self-injury.     There were no vitals  taken for this visit.There is no height or weight on file to calculate BMI.  General Appearance: casual  Eye Contact: fair  Speech: clear  Volume:  Normal  Mood:  Euthymic  Affectfair  Thought Process:  Coherent  Orientation:  Full (Time, Place, and Person)  Thought Content:  Rumination  Suicidal Thoughts:  No  Homicidal Thoughts:  No  Memory:  Immediate;   Fair Recent;   Fair  Judgement:  Other:  limited  Insight:  Shallow  Psychomotor Activity:  Normal  Concentration:  Concentration: Fair and Attention Span: Fair  Recall:  [employer] of Knowledge:Fair  Language: Fair  Akathisia:  No  Handed:    AIMS (if indicated):no involuntary movements noteiceable  Assets:  Desire for Improvement  ADL's:  Intact  Cognition: WNL  Sleep:  Fair   Screenings: AIMS    Flowsheet Row Admission (Discharged) from 04/07/2021 in [facility] [facility]  AIMS Total Score 0      AUDIT    Flowsheet Row Admission (Discharged) from 04/07/2021 in [place] [facility]  Alcohol Use Disorder Identification Test Final Score (AUDIT) 0      GAD-7    Flowsheet Row Counselor from 05/05/2021 in [facility]  Total GAD-7 Score 4      PHQ2-9    [place] Office Visit from 09/13/2022 in [facility] Office Visit from 05/12/2022 in [facility] Office Visit from 03/31/2022 in [facility] Office Visit from 02/08/2022 in [facility] Office Visit from 01/20/2022 in [facility]  PHQ-2 Total Score 0 0 0 0 0      Flowsheet Row Video Visit from 10/08/2022 in [place] Video Visit from 09/06/2022 in [place] Video Visit from 07/26/2022 in [place] No Risk No Risk No Risk       Assessment and Plan: as  follows   Prior documentation reviewed   Bipolar current episode manic with psychosis: Doing better, increase lamictal to 50mg  considering 25mg  small dose, no rash    Avoid klonopine  Adjustment insomnia  : fair work on sleep hygiene,  No rash on lamictal   Anxiety disorder unspecified ;improved, continue cymbalta  EPS; better and now off risperdal    Fu 2- 73m .m or earlier if needed   Merian Capron, MD 10/13/20239:36 AM

## 2022-10-11 DIAGNOSIS — M25569 Pain in unspecified knee: Secondary | ICD-10-CM | POA: Diagnosis not present

## 2022-10-11 DIAGNOSIS — M79606 Pain in leg, unspecified: Secondary | ICD-10-CM | POA: Diagnosis not present

## 2022-10-11 DIAGNOSIS — Z79899 Other long term (current) drug therapy: Secondary | ICD-10-CM | POA: Diagnosis not present

## 2022-10-11 DIAGNOSIS — M545 Low back pain, unspecified: Secondary | ICD-10-CM | POA: Diagnosis not present

## 2022-10-12 ENCOUNTER — Encounter: Payer: Self-pay | Admitting: Internal Medicine

## 2022-10-12 ENCOUNTER — Ambulatory Visit (INDEPENDENT_AMBULATORY_CARE_PROVIDER_SITE_OTHER): Payer: Medicare HMO | Admitting: Internal Medicine

## 2022-10-12 VITALS — BP 136/86 | HR 105 | Resp 18 | Ht 71.0 in | Wt 250.6 lb

## 2022-10-12 DIAGNOSIS — Z720 Tobacco use: Secondary | ICD-10-CM | POA: Diagnosis not present

## 2022-10-12 DIAGNOSIS — I10 Essential (primary) hypertension: Secondary | ICD-10-CM

## 2022-10-12 DIAGNOSIS — E782 Mixed hyperlipidemia: Secondary | ICD-10-CM | POA: Diagnosis not present

## 2022-10-12 DIAGNOSIS — K529 Noninfective gastroenteritis and colitis, unspecified: Secondary | ICD-10-CM | POA: Diagnosis not present

## 2022-10-12 DIAGNOSIS — E1169 Type 2 diabetes mellitus with other specified complication: Secondary | ICD-10-CM

## 2022-10-12 NOTE — Patient Instructions (Addendum)
Please continue taking medications as prescribed.  Please continue to follow low carb diet and perform moderate exercise/walking at least 150 mins/week.  Please consider getting Shingrix and Tdap vaccines at local pharmacy.

## 2022-10-12 NOTE — Progress Notes (Signed)
Established Patient Office Visit  Subjective:  Patient ID: [name] [name], male    DOB: 07-03-1966  Age: 56 y.o. MRN: [id number]  CC:  Chief Complaint  Patient presents with   Follow-up    4 week follow up HTN has had fever diarrhea since 09-28-22 no diarrhea just fever and feel bad now     HPI [name] [name] is a 56 y.o. male with past medical history of bipolar disorder, chronic pain syndrome, polysubstance abuse, and perforated rectum s/p partial colectomy and colostomy who presents for f/u of her chronic medical conditions.  HTN: BP is well-controlled. Takes medications regularly. Patient denies headache, dizziness, chest pain, dyspnea or palpitations.  Type II DM: He has started taking metformin now.  His blood glucose was 94 today.  He denies any polyuria or polyphagia currently.  He has chronic fatigue.  He had fever and diarrhea for about a week, which has resolved about 2 days ago.  He denies any nausea or vomiting currently.     Past Medical History:  Diagnosis Date   Anxiety    Bipolar 1 disorder ([place])    Chronic fatigue    Chronic pain    Colostomy in place [facility]) 09/27/2019   Complete intestinal obstruction ([place])    Depression    Phreesia 12/03/2020   Fecal peritonitis ([place]) 08/03/2019   Fibromyalgia    Hernia of abdominal wall    Hyperlipidemia    Phreesia 12/03/2020   Hypertension    Ileus, postoperative ([facility])    Perforated rectum ([name] [name]) 08/03/2019   Rectal perforation ([name]) 12/23/2019   Sleep apnea     Past Surgical History:  Procedure Laterality Date   COLON RESECTION SIGMOID  08/03/2019   Procedure: COLON RESECTION SIGMOID;  Surgeon: Virl Cagey, MD;  Location: [facility];  Service: General;;   COLONOSCOPY WITH PROPOFOL N/A 10/21/2021   Procedure: COLONOSCOPY WITH PROPOFOL;  Surgeon: Rogene Houston, MD;  Location: [facility];  Service: Endoscopy;  Laterality: N/A;  955   COLOSTOMY N/A 08/03/2019   Procedure: COLOSTOMY;  Surgeon: Virl Cagey,  MD;  Location: [facility];  Service: General;  Laterality: N/A;   COLOSTOMY REVERSAL N/A 12/25/2021   Procedure: COLOSTOMY REVERSAL;  Surgeon: Virl Cagey, MD;  Location: [facility];  Service: General;  Laterality: N/A;   FLEXIBLE SIGMOIDOSCOPY N/A 08/03/2019   Procedure: FLEXIBLE SIGMOIDOSCOPY;  Surgeon: Daneil Dolin, MD;  Location: [facility];  Service: Endoscopy;  Laterality: N/A;   INCISIONAL HERNIA REPAIR N/A 12/25/2021   Procedure: INCISIONAL HERNIA REPAIR WITH ABSORBABLE MESH;  Surgeon: Virl Cagey, MD;  Location: [facility];  Service: General;  Laterality: N/A;   KNEE ARTHROSCOPY Left 2006   miniscus tear   LAPAROTOMY N/A 08/03/2019   Procedure: EXPLORATORY LAPAROTOMY;  Surgeon: Virl Cagey, MD;  Location: [facility];  Service: General;  Laterality: N/A;   LYMPH GLAND EXCISION Left 1983   PARASTOMAL HERNIA REPAIR Left 12/25/2021   Procedure: PARASTOMAL HERNIA REPAIR WITH ABSORBABLE MESH;  Surgeon: Virl Cagey, MD;  Location: [facility];  Service: General;  Laterality: Left;   SMALL INTESTINE SURGERY N/A    Phreesia 12/03/2020   TONSILLECTOMY      Family History  Problem Relation Age of Onset   Diabetes Mother    Heart attack Father    Diabetes Maternal Grandmother    Colon cancer Neg Hx    Colon polyps Neg Hx     Social History   Socioeconomic  History   Marital status: Married    Spouse name: Levada Dy   Number of children: 0   Years of education: 12   Highest education level: Some college, no degree  Occupational History   Not on file  Tobacco Use   Smoking status: Every Day    Packs/day: 1.00    Years: 10.00    Total pack years: 10.00    Types: Cigarettes   Smokeless tobacco: Never  Vaping Use   Vaping Use: Never used  Substance and Sexual Activity   Alcohol use: Not Currently    Comment: denied 10/02/19   Drug use: Not Currently    Types: Cocaine    Comment: last used in December 2020.    Sexual activity: Not Currently  Other Topics Concern    Not on file  Social History Narrative   Pt lives alone and is on disability. Counts his mother, sister and aunt as his social supports.    Social Determinants of Health   Financial Resource Strain: Low Risk  (12/30/2021)   Overall Financial Resource Strain (CARDIA)    Difficulty of Paying Living Expenses: Not hard at all  Food Insecurity: No Food Insecurity (12/30/2021)   Hunger Vital Sign    Worried About Running Out of Food in the Last Year: Never true    Ran Out of Food in the Last Year: Never true  Transportation Needs: Unmet Transportation Needs (12/30/2021)   PRAPARE - Hydrologist (Medical): Not on file    Lack of Transportation (Non-Medical): Yes  Physical Activity: Inactive (12/30/2021)   Exercise Vital Sign    Days of Exercise per Week: 0 days    Minutes of Exercise per Session: 0 min  Stress: No Stress Concern Present (12/30/2021)   [place]    Feeling of Stress : Only a little  Social Connections: Moderately Integrated (12/30/2021)   Social Connection and Isolation Panel [NHANES]    Frequency of Communication with Friends and Family: More than three times a week    Frequency of Social Gatherings with Friends and Family: Once a week    Attends Religious Services: 1 to 4 times per year    Active Member of [employer] or Organizations: No    Attends Archivist Meetings: Never    Marital Status: Married  Human resources officer Violence: Not At Risk (12/30/2021)   Humiliation, Afraid, Rape, and Kick questionnaire    Fear of Current or Ex-Partner: No    Emotionally Abused: No    Physically Abused: No    Sexually Abused: No    Outpatient Medications Prior to Visit  Medication Sig Dispense Refill   acetaminophen (TYLENOL) 500 MG tablet Take 500 mg by mouth every 6 (six) hours as needed for moderate pain.     busPIRone (BUSPAR) 7.5 MG tablet Take 1 tablet (7.5 mg total) by mouth daily. 30  tablet 1   cyclobenzaprine (FLEXERIL) 10 MG tablet Take 10 mg by mouth 2 (two) times daily.     DULoxetine (CYMBALTA) 60 MG capsule Take 1 capsule (60 mg total) by mouth daily. 30 capsule 1   gabapentin (NEURONTIN) 600 MG tablet Take 600 mg by mouth every 8 (eight) hours.     hydrOXYzine (VISTARIL) 25 MG capsule TAKE 1 CAPSULE TWICE DAILY AS NEEDED FOR ANXIETY. 60 capsule 1   lamoTRIgine (LAMICTAL) 25 MG tablet Take 2 tablets (50 mg total) by mouth daily. Take 2 a day  60 tablet 1   metFORMIN (GLUCOPHAGE) 500 MG tablet Take 1 tablet (500 mg total) by mouth daily with breakfast. 90 tablet 1   rosuvastatin (CRESTOR) 10 MG tablet Take 1 tablet (10 mg total) by mouth daily. 90 tablet 1   telmisartan (MICARDIS) 40 MG tablet Take 1 tablet (40 mg total) by mouth daily. 30 tablet 2   traMADol (ULTRAM) 50 MG tablet Take 50 mg by mouth 4 (four) times daily.     No facility-administered medications prior to visit.    Allergies  Allergen Reactions   Codeine Shortness Of Breath and Swelling   Divalproex Sodium Diarrhea, Itching, Rash, Shortness Of Breath and Swelling   Erythromycin Hives, Itching, Rash and Swelling   Iodinated Contrast Media Swelling    Patient states he received "red Dye" 15-20 years ago and become red/flushed. No other symptoms. Patient did do 13 hour pre meds for CT 09/17/2021   Latex Itching and Rash   Morphine And Related Shortness Of Breath and Swelling   Sulfa Antibiotics Diarrhea, Itching, Rash, Shortness Of Breath and Swelling   Wheat Bran Hives, Itching, Rash, Shortness Of Breath and Swelling   Demerol [Meperidine]     Body over heats and pouring sweat   Levofloxacin     Pt does not recall reaction    Niacin And Related Rash   Penicillins Swelling and Rash    REACTION: Rash and facial swelling at age 30    ROS Review of Systems  Constitutional:  Negative for chills and fever.  HENT:  Negative for congestion and sore throat.   Eyes:  Negative for pain and discharge.   Respiratory:  Negative for cough and shortness of breath.   Cardiovascular:  Negative for chest pain and palpitations.  Gastrointestinal:  Negative for constipation, diarrhea, nausea and vomiting.  Endocrine: Negative for polydipsia and polyuria.  Genitourinary:  Negative for dysuria and hematuria.  Musculoskeletal:  Positive for arthralgias, back pain and myalgias. Negative for neck pain and neck stiffness.  Skin:  Negative for rash.  Neurological:  Negative for dizziness, weakness, numbness and headaches.  Psychiatric/Behavioral:  Positive for decreased concentration and sleep disturbance. Negative for agitation and behavioral problems. The patient is nervous/anxious.       Objective:    Physical Exam Vitals reviewed.  Constitutional:      General: He is not in acute distress.    Appearance: He is not diaphoretic.  HENT:     Head: Normocephalic.     Nose: Nose normal.     Mouth/Throat:     Mouth: Mucous membranes are moist.     Dentition: Abnormal dentition.  Eyes:     General: No scleral icterus.    Extraocular Movements: Extraocular movements intact.  Cardiovascular:     Rate and Rhythm: Normal rate and regular rhythm.     Pulses: Normal pulses.     Heart sounds: Normal heart sounds. No murmur heard. Pulmonary:     Breath sounds: Normal breath sounds. No wheezing or rales.  Abdominal:     Palpations: Abdomen is soft.     Tenderness: There is no abdominal tenderness.  Musculoskeletal:     Cervical back: Neck supple. No tenderness.     Right lower leg: No edema.     Left lower leg: No edema.     Comments: Right knee orthotic present  Skin:    General: Skin is warm.     Findings: No rash.  Neurological:     General: No focal  deficit present.     Mental Status: He is alert and oriented to person, place, and time.     Sensory: No sensory deficit.     Motor: No weakness.  Psychiatric:        Mood and Affect: Mood is anxious.        Behavior: Behavior is  hyperactive.        Thought Content: Thought content does not include homicidal or suicidal ideation.     BP 136/86 (BP Location: Right Arm, Patient Position: Sitting, Cuff Size: Normal)   Pulse (!) 105   Resp 18   Ht $R'5\' 11"'Sv$  (1.803 m)   Wt 250 lb 9.6 oz (113.7 kg)   SpO2 98%   BMI 34.95 kg/m  Wt Readings from Last 3 Encounters:  10/12/22 250 lb 9.6 oz (113.7 kg)  09/13/22 259 lb 3.2 oz (117.6 kg)  05/12/22 256 lb 9.6 oz (116.4 kg)    Lab Results  Component Value Date   TSH 1.490 05/12/2022   Lab Results  Component Value Date   WBC 11.0 (H) 05/12/2022   HGB 15.1 05/12/2022   HCT 44.2 05/12/2022   MCV 80 05/12/2022   PLT 304 05/12/2022   Lab Results  Component Value Date   NA 135 05/12/2022   K 4.4 05/12/2022   CO2 21 05/12/2022   GLUCOSE 141 (H) 05/12/2022   BUN 11 05/12/2022   CREATININE 1.03 05/12/2022   BILITOT 0.3 05/12/2022   ALKPHOS 124 (H) 05/12/2022   AST 14 05/12/2022   ALT 15 05/12/2022   PROT 7.1 05/12/2022   ALBUMIN 4.2 05/12/2022   CALCIUM 9.0 05/12/2022   ANIONGAP 8 12/28/2021   EGFR 85 05/12/2022   Lab Results  Component Value Date   CHOL 238 (H) 05/12/2022   Lab Results  Component Value Date   HDL 30 (L) 05/12/2022   Lab Results  Component Value Date   LDLCALC 132 (H) 05/12/2022   Lab Results  Component Value Date   TRIG 417 (H) 05/12/2022   Lab Results  Component Value Date   CHOLHDL 7.9 (H) 05/12/2022   Lab Results  Component Value Date   HGBA1C 6.7 09/13/2022   HGBA1C 6.7 09/13/2022      Assessment & Plan:   Problem List Items Addressed This Visit       Cardiovascular and Mediastinum   HTN (hypertension) - Primary    BP Readings from Last 1 Encounters:  10/12/22 136/86  Well controlled with telmisartan to 40 mg daily Counseled for compliance with the medications Advised DASH diet and moderate exercise/walking, at least 150 mins/week        Endocrine   Diabetes mellitus ([place])    Lab Results  Component Value  Date   HGBA1C 6.7 09/13/2022   HGBA1C 6.7 09/13/2022   New onset Started metformin 500 mg daily Advised to follow diabetic diet On ARB and statin, needs to be compliant F/u CMP and lipid panel Diabetic foot exam: Today Diabetic eye exam: Advised to follow up with Ophthalmology for diabetic eye exam      Relevant Orders   Urine Microalbumin w/creat. ratio   CMP14+EGFR   Hemoglobin A1c     Other   Mixed hyperlipidemia    Has started Crestor now Check lipid profile      Relevant Orders   Lipid Profile   Tobacco abuse    Smokes about 0.5 pack/day  Asked about quitting: confirms that he/she currently smokes cigarettes Advise to quit smoking:  Educated about QUITTING to reduce the risk of cancer, cardio and cerebrovascular disease. Assess willingness: Unwilling to quit at this time, but is working on cutting back. Assist with counseling and pharmacotherapy: Counseled for 5 minutes and literature provided. Arrange for follow up: follow up in 3 months and continue to offer help.      Other Visit Diagnoses     Acute gastroenteritis     Recent episode of fever and diarrhea-resolved now, likely acute viral or bacterial gastroenteritis Usually self resolving Advised to maintain adequate hydration       No orders of the defined types were placed in this encounter.   Follow-up: Return in about 3 months (around 01/12/2023) for DM and HTN.    Lindell Spar, MD

## 2022-10-12 NOTE — Assessment & Plan Note (Signed)
Has started Crestor now Check lipid profile

## 2022-10-12 NOTE — Assessment & Plan Note (Addendum)
BP Readings from Last 1 Encounters:  10/12/22 136/86   Well controlled with telmisartan 40 mg daily Counseled for compliance with the medications Advised DASH diet and moderate exercise/walking, at least 150 mins/week

## 2022-10-12 NOTE — Assessment & Plan Note (Signed)
Smokes about 0.5 pack/day  Asked about quitting: confirms that he/she currently smokes cigarettes Advise to quit smoking: Educated about QUITTING to reduce the risk of cancer, cardio and cerebrovascular disease. Assess willingness: Unwilling to quit at this time, but is working on cutting back. Assist with counseling and pharmacotherapy: Counseled for 5 minutes and literature provided. Arrange for follow up: follow up in 3 months and continue to offer help. 

## 2022-10-12 NOTE — Assessment & Plan Note (Signed)
Lab Results  Component Value Date   HGBA1C 6.7 09/13/2022   HGBA1C 6.7 09/13/2022    New onset Started metformin 500 mg daily Advised to follow diabetic diet On ARB and statin, needs to be compliant F/u CMP and lipid panel Diabetic foot exam: Today Diabetic eye exam: Advised to follow up with Ophthalmology for diabetic eye exam

## 2022-10-14 LAB — MICROALBUMIN / CREATININE URINE RATIO
Creatinine, Urine: 210.3 mg/dL
Microalb/Creat Ratio: 2 mg/g creat (ref 0–29)
Microalbumin, Urine: 3.3 ug/mL

## 2022-11-22 ENCOUNTER — Other Ambulatory Visit: Payer: Self-pay

## 2022-11-22 DIAGNOSIS — I10 Essential (primary) hypertension: Secondary | ICD-10-CM

## 2022-11-22 MED ORDER — TELMISARTAN 40 MG PO TABS: 40.0000 mg | ORAL_TABLET | Freq: Every day | ORAL | 2 refills | Status: DC

## 2022-11-27 ENCOUNTER — Other Ambulatory Visit (HOSPITAL_COMMUNITY): Payer: Self-pay | Admitting: Psychiatry

## 2022-12-06 IMAGING — MR MR KNEE*R* W/O CM
7 series · 40 of 40 positions shown · non-contrast
Comparison: Radiograph 07/17/2021

CLINICAL DATA: Right knee pain for 2 months after striking knee on
a park binge.

EXAM:
MRI OF THE RIGHT KNEE WITHOUT CONTRAST
TECHNIQUE: Multiplanar, multisequence MR imaging of the knee was performed. No
intravenous contrast was administered.

[Series 8: T2 fat-sat · axial · right · 4.0mm · 0.47mm/px · z∈[-79,+45]mm · 7 of 26 slices shown (1 of 3)]
[im 1/26]
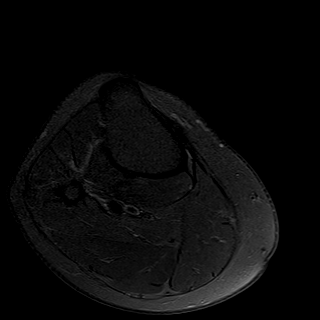
[im 5/26]
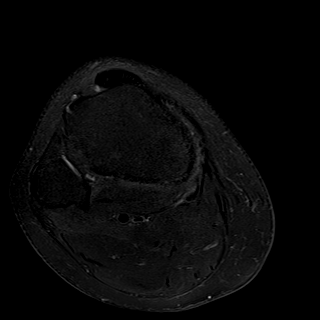
[im 9/26]
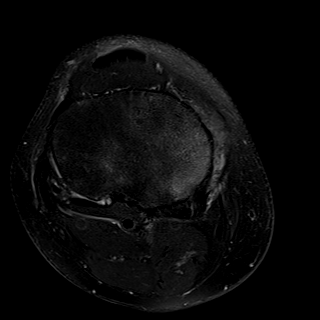
[im 13/26]
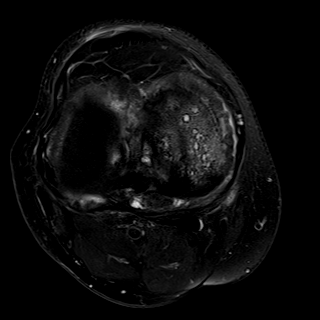
[im 17/26]
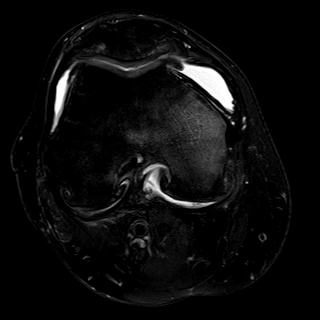
[im 21/26]
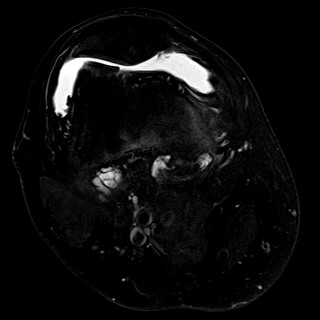
[im 26/26]
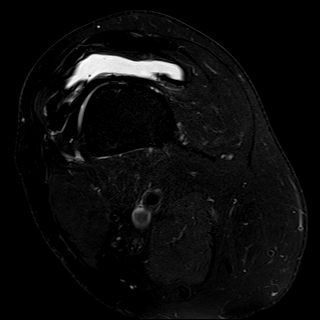

[Series 9: T1 · coronal · right · 4.0mm · 0.59mm/px · 5 of 24 slices shown]
[im 1/24]
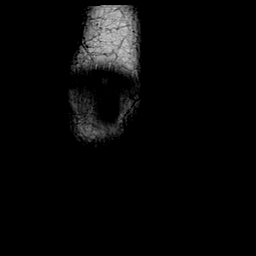
[im 6/24]
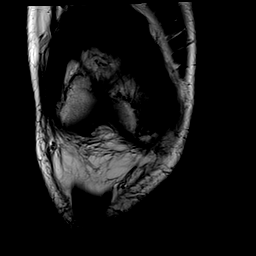
[im 12/24]
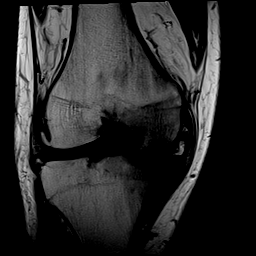
[im 18/24]
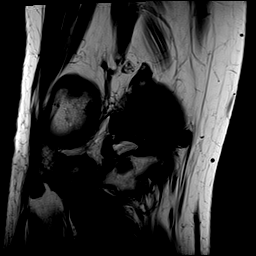
[im 24/24]
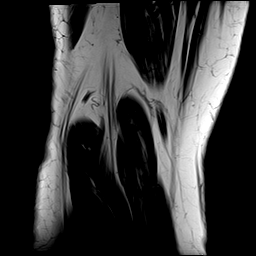

[Series 10: T2 fat-sat · coronal · right · 4.0mm · 0.59mm/px · 6 of 28 slices shown (2 of 3)]
[im 1/28]
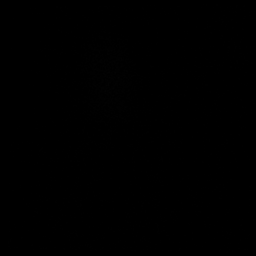
[im 6/28]
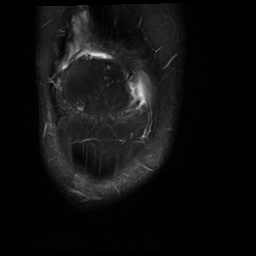
[im 11/28]
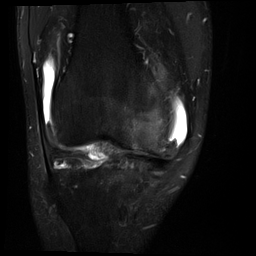
[im 17/28]
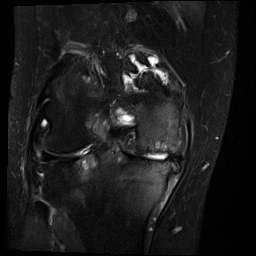
[im 22/28]
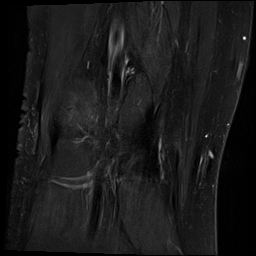
[im 28/28]
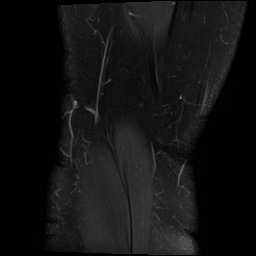

[Series 11: PD fat-sat · coronal · right · 4.0mm · 0.59mm/px · 6 of 28 slices shown (1 of 2)]
[im 1/28]
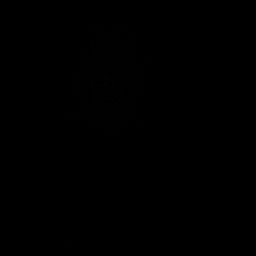
[im 6/28]
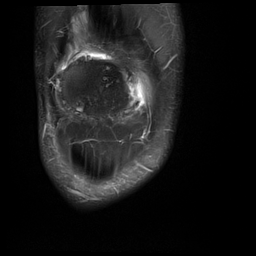
[im 11/28]
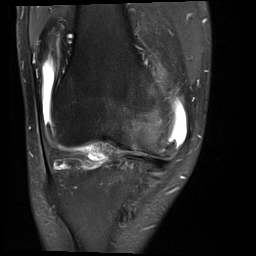
[im 17/28]
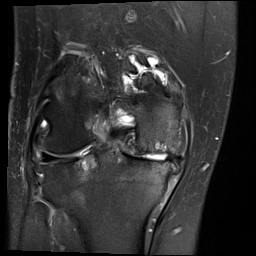
[im 22/28]
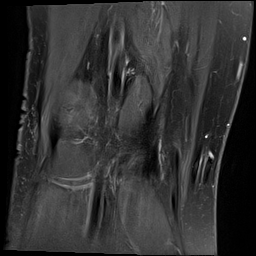
[im 28/28]
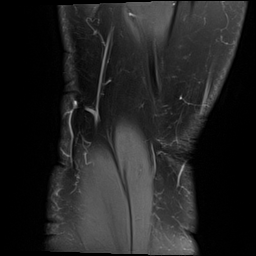

[Series 12: PD fat-sat · sagittal · right · 3.0mm · 0.52mm/px · 8 of 36 slices shown (2 of 2)]
[im 1/36]
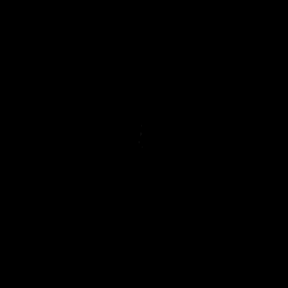
[im 6/36]
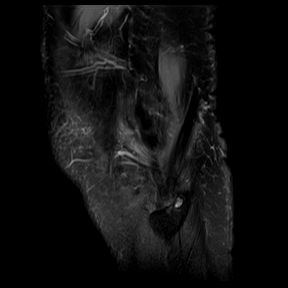
[im 11/36]
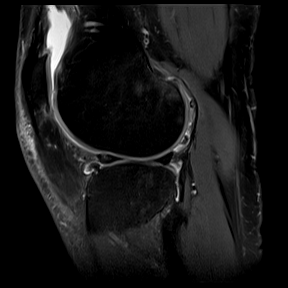
[im 16/36]
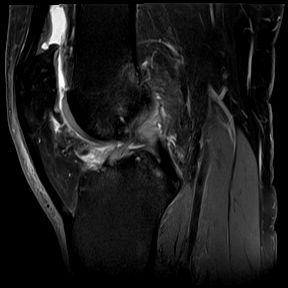
[im 21/36]
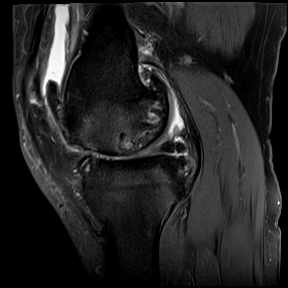
[im 26/36]
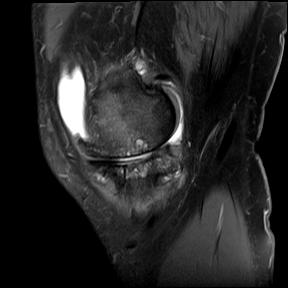
[im 31/36]
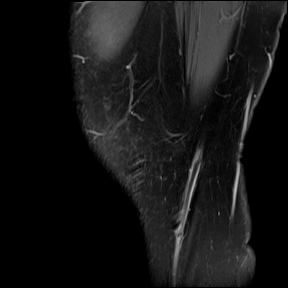
[im 36/36]
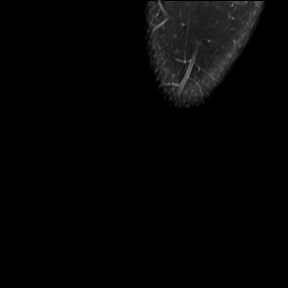

[Series 13: T2 fat-sat · sagittal · right · 3.0mm · 0.59mm/px · 6 of 28 slices shown (3 of 3)]
[im 1/28]
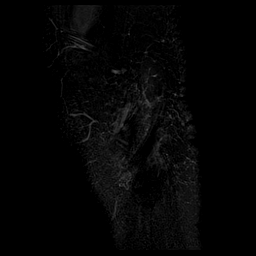
[im 6/28]
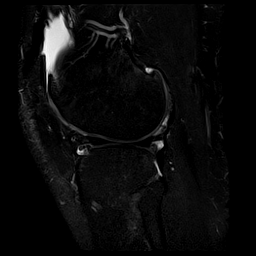
[im 11/28]
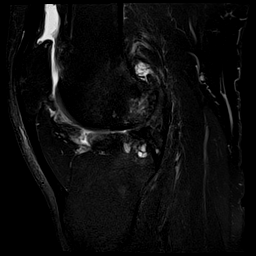
[im 17/28]
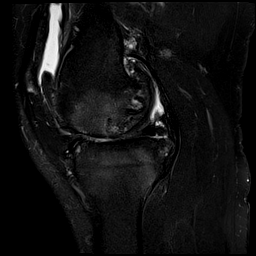
[im 22/28]
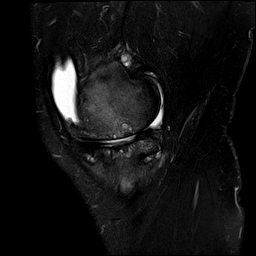
[im 28/28]
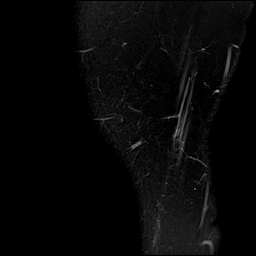

[Series 14: PD · coronal · right · 2.0mm · 0.47mm/px · 2 of 10 slices shown]
[im 1/10]
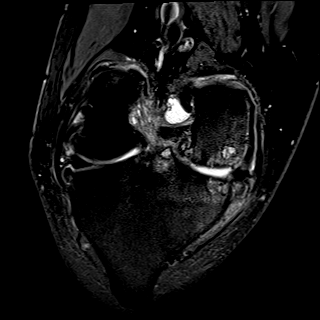
[im 10/10]
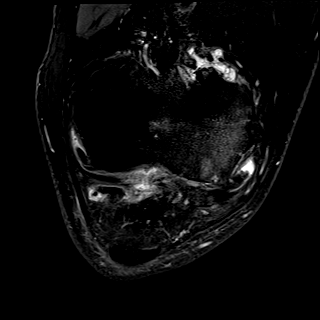

[40 of 40 positions shown; findings below may reference images not displayed]

FINDINGS: MENISCI

Medial meniscus: Degenerative tearing of the posterior horn along
the superior surface with oblique grade 3 signal involving the
superior surface on image 24 series 12 a and free edge irregularity
in the posterior horn probably reflecting some mild free edge
tearing. There is substantial extrusion of the medial meniscus due
to severe articular space narrowing and spurring. The midbody in
part of the anterior horn have somewhat flattened contour.

Lateral meniscus:  Unremarkable

LIGAMENTS

Cruciates:  Unremarkable

Collaterals: Mild thickening of the proximal MCL potentially from a
remote injury. Mild proximal popliteus tendinopathy.

CARTILAGE

Patellofemoral: Moderate chondral thinning along portions of the
medial patellar facet posterior patellar ridge. Substantial marginal
spurring. Moderate chondral thinning with some focal chondral
irregularity along the medial femoral trochlear groove inferiorly
for example on image 12 series 8.

Medial: Markedly severe full-thickness articular cartilage loss in
the medial compartment with prominent subcortical marrow edema,
confluent degenerative subcortical cysts, extensive marginal
spurring, and suspected subcortical sclerosis.

Lateral: Moderate degenerative chondral thinning with marginal
spurring.

Joint: Moderate knee effusion. Mild synovitis. Thickened medial
plica. Suspected clustered free osteochondral fragments posterior to
the posterior horn of the medial meniscus and PCL, possibly from
fragmented osteophytes.

Popliteal Fossa: A 1.2 by 0.5 by 0.9 cm cystic lesion along the
superficial margin of the posteromedial joint capsule on image 13 of
series [DATE] be a ganglion cyst or conceivably extension of a
parameniscal cyst associated with the posterior horn medial meniscal
tear.

Extensor Mechanism:  Unremarkable

Bones: Geodes posteriorly along the tibial spine. Geode or erosion
along the proximal fibular head for example on image 7 series 9.

Other: No supplemental non-categorized findings.
IMPRESSION: 1. Severe osteoarthritis particularly markedly severe in the medial
compartment and more moderate in the patellofemoral joint and
lateral compartment.
2. Degenerative tearing of the posterior horn medial meniscus with
grade 3 oblique and somewhat amorphous signal extending to the
superior surface. There is medial meniscal extrusion due to the
severe articular space narrowing and prominent marginal osteophytes.
3. Moderate knee effusion with mild synovitis and a thickened medial
plica.
4. Clustered free osteochondral fragments posterior to the posterior
horn medial meniscus and PCL, probably from fragmented osteophytes.
5. Small ganglion cyst or parameniscal cyst along the superficial
posteromedial joint capsule margin.
6. Geode or erosion along the proximal fibular head. Geodes noted
along the posterior tibial spine.
7. Mildly thickened proximal MCL possibly from a remote injury. No
surrounding edema currently.

## 2023-01-06 ENCOUNTER — Encounter (HOSPITAL_COMMUNITY): Payer: Self-pay

## 2023-01-06 ENCOUNTER — Telehealth (HOSPITAL_COMMUNITY): Payer: Medicare PPO | Admitting: Psychiatry

## 2023-01-13 ENCOUNTER — Ambulatory Visit: Payer: Medicare HMO | Admitting: Internal Medicine

## 2023-01-15 ENCOUNTER — Other Ambulatory Visit (HOSPITAL_COMMUNITY): Payer: Self-pay | Admitting: Psychiatry

## 2023-01-26 ENCOUNTER — Ambulatory Visit: Payer: Medicare HMO | Admitting: Internal Medicine

## 2023-02-10 ENCOUNTER — Other Ambulatory Visit (HOSPITAL_COMMUNITY): Payer: Self-pay | Admitting: Psychiatry

## 2023-02-10 DIAGNOSIS — F419 Anxiety disorder, unspecified: Secondary | ICD-10-CM

## 2023-02-15 ENCOUNTER — Telehealth (HOSPITAL_COMMUNITY): Payer: Self-pay | Admitting: *Deleted

## 2023-02-15 DIAGNOSIS — F419 Anxiety disorder, unspecified: Secondary | ICD-10-CM

## 2023-02-15 MED ORDER — HYDROXYZINE PAMOATE 25 MG PO CAPS: ORAL_CAPSULE | ORAL | 0 refills | Status: DC

## 2023-02-15 NOTE — Telephone Encounter (Signed)
PATIENT REQUESTED REFILL -- hydrOXYzine (VISTARIL) 25 MG capsule  PATIENT STATED THAT  TWICE HE TOOK MORE THAN THE PRESCRIBED --   TAKE 1 CAPSULE BY MOUTH TWICE DAILY  AS NEEDED FOR ANXIETY   WHEN ASKED WHY HE REPLIED FAMILY& LIFE STUFF  NEXT VISIT  02-16-23 LAST VISIT   10-08-22

## 2023-02-15 NOTE — Addendum Note (Signed)
Addended by: Merian Capron on: 02/15/2023 04:02 PM   Modules accepted: Orders

## 2023-02-16 ENCOUNTER — Encounter (HOSPITAL_COMMUNITY): Payer: Self-pay | Admitting: Psychiatry

## 2023-02-16 ENCOUNTER — Ambulatory Visit: Payer: Medicare HMO | Admitting: Internal Medicine

## 2023-02-16 ENCOUNTER — Telehealth (HOSPITAL_COMMUNITY): Payer: Medicare PPO | Admitting: Psychiatry

## 2023-02-19 ENCOUNTER — Other Ambulatory Visit: Payer: Self-pay | Admitting: Internal Medicine

## 2023-02-19 DIAGNOSIS — E1169 Type 2 diabetes mellitus with other specified complication: Secondary | ICD-10-CM

## 2023-02-24 ENCOUNTER — Encounter: Payer: Self-pay | Admitting: Radiology

## 2023-03-01 ENCOUNTER — Other Ambulatory Visit: Payer: Self-pay | Admitting: Internal Medicine

## 2023-03-01 DIAGNOSIS — I10 Essential (primary) hypertension: Secondary | ICD-10-CM

## 2023-03-02 ENCOUNTER — Ambulatory Visit: Payer: Medicare HMO | Admitting: Internal Medicine

## 2023-03-10 ENCOUNTER — Other Ambulatory Visit (HOSPITAL_COMMUNITY): Payer: Self-pay | Admitting: Psychiatry

## 2023-03-16 ENCOUNTER — Ambulatory Visit (INDEPENDENT_AMBULATORY_CARE_PROVIDER_SITE_OTHER): Payer: Medicare PPO | Admitting: Internal Medicine

## 2023-03-16 ENCOUNTER — Encounter: Payer: Self-pay | Admitting: Internal Medicine

## 2023-03-16 VITALS — BP 134/79 | HR 87 | Ht 70.0 in | Wt 228.0 lb

## 2023-03-16 DIAGNOSIS — F419 Anxiety disorder, unspecified: Secondary | ICD-10-CM | POA: Diagnosis not present

## 2023-03-16 DIAGNOSIS — E559 Vitamin D deficiency, unspecified: Secondary | ICD-10-CM | POA: Diagnosis not present

## 2023-03-16 DIAGNOSIS — F312 Bipolar disorder, current episode manic severe with psychotic features: Secondary | ICD-10-CM | POA: Diagnosis not present

## 2023-03-16 DIAGNOSIS — E782 Mixed hyperlipidemia: Secondary | ICD-10-CM | POA: Diagnosis not present

## 2023-03-16 DIAGNOSIS — E1169 Type 2 diabetes mellitus with other specified complication: Secondary | ICD-10-CM

## 2023-03-16 DIAGNOSIS — I10 Essential (primary) hypertension: Secondary | ICD-10-CM

## 2023-03-16 DIAGNOSIS — Z720 Tobacco use: Secondary | ICD-10-CM | POA: Diagnosis not present

## 2023-03-16 LAB — POCT GLYCOSYLATED HEMOGLOBIN (HGB A1C): HbA1c, POC (controlled diabetic range): 6.1 % (ref 0.0–7.0)

## 2023-03-16 MED ORDER — LANCET DEVICE MISC: 1.0000 | Freq: Three times a day (TID) | 0 refills | Status: AC

## 2023-03-16 MED ORDER — ROSUVASTATIN CALCIUM 10 MG PO TABS: 10.0000 mg | ORAL_TABLET | Freq: Every day | ORAL | 1 refills | Status: DC

## 2023-03-16 MED ORDER — LANCETS MISC. MISC: 1.0000 | Freq: Three times a day (TID) | 0 refills | Status: AC

## 2023-03-16 MED ORDER — BLOOD GLUCOSE TEST VI STRP: 1.0000 | ORAL_STRIP | Freq: Three times a day (TID) | 0 refills | Status: DC

## 2023-03-16 MED ORDER — TELMISARTAN 40 MG PO TABS: 40.0000 mg | ORAL_TABLET | Freq: Every day | ORAL | 1 refills | Status: DC

## 2023-03-16 MED ORDER — BLOOD GLUCOSE MONITORING SUPPL DEVI: 1.0000 | Freq: Three times a day (TID) | 0 refills | Status: DC

## 2023-03-16 MED ORDER — HYDROXYZINE PAMOATE 25 MG PO CAPS: ORAL_CAPSULE | ORAL | 3 refills | Status: DC

## 2023-03-16 MED ORDER — BUSPIRONE HCL 7.5 MG PO TABS: 7.5000 mg | ORAL_TABLET | Freq: Every day | ORAL | 3 refills | Status: DC

## 2023-03-16 MED ORDER — DULOXETINE HCL 60 MG PO CPEP: 60.0000 mg | ORAL_CAPSULE | Freq: Every day | ORAL | 3 refills | Status: DC

## 2023-03-16 NOTE — Assessment & Plan Note (Signed)
BMI Readings from Last 1 Encounters:  03/16/23 32.71 kg/m   With HLD, depression and chronic pain Diet modification and moderate exercise advised Has lost 22 lbs with low carb diet

## 2023-03-16 NOTE — Assessment & Plan Note (Signed)
Atarax PRN, refilled

## 2023-03-16 NOTE — Progress Notes (Addendum)
Established Patient Office Visit  Subjective:  Patient ID: [name] [name], male    DOB: [birth year]  Age: 57 y.o. MRN: [id number]  CC:  Chief Complaint  Patient presents with   Diabetes    Three month follow up for diabetes and hypertension     HPI [name] [name] is a 57 y.o. male with past medical history of bipolar disorder, chronic pain syndrome, polysubstance abuse, and perforated rectum s/p partial colectomy and colostomy who presents for f/u of her chronic medical conditions.  HTN: BP is well-controlled. Takes medications regularly. Patient denies headache, dizziness, chest pain, dyspnea or palpitations.  Type II DM: He has been taking metformin now.  His blood glucose had been around 90-120 at home.  He denies any polyuria or polyphagia currently. He has lost 22 lbs with low-carb diet.  Bipolar disorder: He has lost f/u with Dr. De Nurse now.  He was doing well with Lamictal, Cymbalta, BuSpar and Vistaril.  He still has his medicines, but needs refills.  He currently denies any SI or HI.  He is currently living with his wife.     Past Medical History:  Diagnosis Date   Anxiety    Bipolar 1 disorder ([place])    Chronic fatigue    Chronic pain    Colostomy in place [facility]) 09/27/2019   Complete intestinal obstruction ([place])    Depression    Phreesia 12/03/2020   Fecal peritonitis ([place]) 08/03/2019   Fibromyalgia    Hernia of abdominal wall    Hyperlipidemia    Phreesia 12/03/2020   Hypertension    Ileus, postoperative (HCC)    Perforated rectum ([place]) 08/03/2019   Rectal perforation ([place]) 12/23/2019   Sleep apnea     Past Surgical History:  Procedure Laterality Date   COLON RESECTION SIGMOID  08/03/2019   Procedure: COLON RESECTION SIGMOID;  Surgeon: Virl Cagey, MD;  Location: [facility];  Service: General;;   COLONOSCOPY WITH PROPOFOL N/A 10/21/2021   Procedure: COLONOSCOPY WITH PROPOFOL;  Surgeon: Rogene Houston, MD;  Location: [facility];  Service: Endoscopy;   Laterality: N/A;  955   COLOSTOMY N/A 08/03/2019   Procedure: COLOSTOMY;  Surgeon: Virl Cagey, MD;  Location: [facility];  Service: General;  Laterality: N/A;   COLOSTOMY REVERSAL N/A 12/25/2021   Procedure: COLOSTOMY REVERSAL;  Surgeon: Virl Cagey, MD;  Location: [facility];  Service: General;  Laterality: N/A;   FLEXIBLE SIGMOIDOSCOPY N/A 08/03/2019   Procedure: FLEXIBLE SIGMOIDOSCOPY;  Surgeon: Daneil Dolin, MD;  Location: [facility];  Service: Endoscopy;  Laterality: N/A;   INCISIONAL HERNIA REPAIR N/A 12/25/2021   Procedure: INCISIONAL HERNIA REPAIR WITH ABSORBABLE MESH;  Surgeon: Virl Cagey, MD;  Location: [facility];  Service: General;  Laterality: N/A;   KNEE ARTHROSCOPY Left 2006   miniscus tear   LAPAROTOMY N/A 08/03/2019   Procedure: EXPLORATORY LAPAROTOMY;  Surgeon: Virl Cagey, MD;  Location: [facility];  Service: General;  Laterality: N/A;   LYMPH GLAND EXCISION Left 1983   PARASTOMAL HERNIA REPAIR Left 12/25/2021   Procedure: PARASTOMAL HERNIA REPAIR WITH ABSORBABLE MESH;  Surgeon: Virl Cagey, MD;  Location: [facility];  Service: General;  Laterality: Left;   SMALL INTESTINE SURGERY N/A    Phreesia 12/03/2020   TONSILLECTOMY      Family History  Problem Relation Age of Onset   Diabetes Mother    Heart attack Father    Diabetes Maternal Grandmother    Colon cancer Neg  Hx    Colon polyps Neg Hx     Social History   Socioeconomic History   Marital status: Single    Spouse name: Levada Dy   Number of children: 0   Years of education: 12   Highest education level: Some college, no degree  Occupational History   Not on file  Tobacco Use   Smoking status: Every Day    Packs/day: 1.00    Years: 10.00    Additional pack years: 0.00    Total pack years: 10.00    Types: Cigarettes   Smokeless tobacco: Never  Vaping Use   Vaping Use: Never used  Substance and Sexual Activity   Alcohol use: Not Currently    Comment: denied 10/02/19   Drug use: Not  Currently    Types: Cocaine    Comment: last used in December 2020.    Sexual activity: Not Currently  Other Topics Concern   Not on file  Social History Narrative   Pt lives alone and is on disability. Counts his mother, sister and aunt as his social supports.    Social Determinants of Health   Financial Resource Strain: Low Risk  (12/30/2021)   Overall Financial Resource Strain (CARDIA)    Difficulty of Paying Living Expenses: Not hard at all  Food Insecurity: No Food Insecurity (12/30/2021)   Hunger Vital Sign    Worried About Running Out of Food in the Last Year: Never true    Ran Out of Food in the Last Year: Never true  Transportation Needs: Unmet Transportation Needs (12/30/2021)   PRAPARE - Hydrologist (Medical): Not on file    Lack of Transportation (Non-Medical): Yes  Physical Activity: Inactive (12/30/2021)   Exercise Vital Sign    Days of Exercise per Week: 0 days    Minutes of Exercise per Session: 0 min  Stress: No Stress Concern Present (12/30/2021)   [place]    Feeling of Stress : Only a little  Social Connections: Moderately Integrated (12/30/2021)   Social Connection and Isolation Panel [NHANES]    Frequency of Communication with Friends and Family: More than three times a week    Frequency of Social Gatherings with Friends and Family: Once a week    Attends Religious Services: 1 to 4 times per year    Active Member of [employer] or Organizations: No    Attends Archivist Meetings: Never    Marital Status: Married  Human resources officer Violence: Not At Risk (12/30/2021)   Humiliation, Afraid, Rape, and Kick questionnaire    Fear of Current or Ex-Partner: No    Emotionally Abused: No    Physically Abused: No    Sexually Abused: No    Outpatient Medications Prior to Visit  Medication Sig Dispense Refill   acetaminophen (TYLENOL) 500 MG tablet Take 500 mg by mouth every 6  (six) hours as needed for moderate pain.     cyclobenzaprine (FLEXERIL) 10 MG tablet Take 10 mg by mouth 2 (two) times daily.     gabapentin (NEURONTIN) 600 MG tablet Take 600 mg by mouth every 8 (eight) hours.     lamoTRIgine (LAMICTAL) 25 MG tablet Take 2 tablets by mouth once daily 60 tablet 0   metFORMIN (GLUCOPHAGE) 500 MG tablet Take 1 tablet by mouth once daily with breakfast 90 tablet 0   traMADol (ULTRAM) 50 MG tablet Take 50 mg by mouth 4 (four) times daily.  busPIRone (BUSPAR) 7.5 MG tablet Take 1 tablet by mouth once daily 30 tablet 0   DULoxetine (CYMBALTA) 60 MG capsule Take 1 capsule (60 mg total) by mouth daily. 30 capsule 1   hydrOXYzine (VISTARIL) 25 MG capsule TAKE 1 CAPSULE BY MOUTH TWICE DAILY AS NEEDED FOR ANXIETY 60 capsule 0   rosuvastatin (CRESTOR) 10 MG tablet Take 1 tablet (10 mg total) by mouth daily. 90 tablet 1   telmisartan (MICARDIS) 40 MG tablet Take 1 tablet by mouth once daily 30 tablet 0   No facility-administered medications prior to visit.    Allergies  Allergen Reactions   Codeine Shortness Of Breath and Swelling   Divalproex Sodium Diarrhea, Itching, Rash, Shortness Of Breath and Swelling   Erythromycin Hives, Itching, Rash and Swelling   Iodinated Contrast Media Swelling    Patient states he received "red Dye" 15-20 years ago and become red/flushed. No other symptoms. Patient did do 13 hour pre meds for CT 09/17/2021   Latex Itching and Rash   Morphine And Related Shortness Of Breath and Swelling   Sulfa Antibiotics Diarrhea, Itching, Rash, Shortness Of Breath and Swelling   Wheat Hives, Itching, Rash, Shortness Of Breath and Swelling   Demerol [Meperidine]     Body over heats and pouring sweat   Levofloxacin     Pt does not recall reaction    Niacin And Related Rash   Penicillins Swelling and Rash    REACTION: Rash and facial swelling at age 21    ROS Review of Systems  Constitutional:  Negative for chills and fever.  HENT:  Negative  for congestion and sore throat.   Eyes:  Negative for pain and discharge.  Respiratory:  Negative for cough and shortness of breath.   Cardiovascular:  Negative for chest pain and palpitations.  Gastrointestinal:  Negative for constipation, diarrhea, nausea and vomiting.  Endocrine: Negative for polydipsia and polyuria.  Genitourinary:  Negative for dysuria and hematuria.  Musculoskeletal:  Positive for arthralgias, back pain and myalgias. Negative for neck pain and neck stiffness.  Skin:  Negative for rash.  Neurological:  Negative for dizziness, weakness, numbness and headaches.  Psychiatric/Behavioral:  Positive for decreased concentration and sleep disturbance. Negative for agitation and behavioral problems. The patient is nervous/anxious.       Objective:    Physical Exam Vitals reviewed.  Constitutional:      General: He is not in acute distress.    Appearance: He is not diaphoretic.  HENT:     Head: Normocephalic.     Nose: Nose normal.     Mouth/Throat:     Mouth: Mucous membranes are moist.     Dentition: Abnormal dentition.  Eyes:     General: No scleral icterus.    Extraocular Movements: Extraocular movements intact.  Cardiovascular:     Rate and Rhythm: Normal rate and regular rhythm.     Pulses: Normal pulses.     Heart sounds: Normal heart sounds. No murmur heard. Pulmonary:     Breath sounds: Normal breath sounds. No wheezing or rales.  Abdominal:     Palpations: Abdomen is soft.     Tenderness: There is no abdominal tenderness.  Musculoskeletal:     Cervical back: Neck supple. No tenderness.     Right lower leg: No edema.     Left lower leg: No edema.  Skin:    General: Skin is warm.     Findings: No rash.  Neurological:     General: No  focal deficit present.     Mental Status: He is alert and oriented to person, place, and time.     Sensory: No sensory deficit.     Motor: No weakness.  Psychiatric:        Mood and Affect: Mood is anxious.         Behavior: Behavior is hyperactive.        Thought Content: Thought content does not include homicidal or suicidal ideation.     BP 134/79 (BP Location: Right Arm, Patient Position: Sitting, Cuff Size: Large)   Pulse 87   Ht 5\' 10"  (1.778 m)   Wt 228 lb (103.4 kg)   SpO2 98%   BMI 32.71 kg/m  Wt Readings from Last 3 Encounters:  03/16/23 228 lb (103.4 kg)  10/12/22 250 lb 9.6 oz (113.7 kg)  09/13/22 259 lb 3.2 oz (117.6 kg)    Lab Results  Component Value Date   TSH 1.490 05/12/2022   Lab Results  Component Value Date   WBC 11.0 (H) 05/12/2022   HGB 15.1 05/12/2022   HCT 44.2 05/12/2022   MCV 80 05/12/2022   PLT 304 05/12/2022   Lab Results  Component Value Date   NA 135 05/12/2022   K 4.4 05/12/2022   CO2 21 05/12/2022   GLUCOSE 141 (H) 05/12/2022   BUN 11 05/12/2022   CREATININE 1.03 05/12/2022   BILITOT 0.3 05/12/2022   ALKPHOS 124 (H) 05/12/2022   AST 14 05/12/2022   ALT 15 05/12/2022   PROT 7.1 05/12/2022   ALBUMIN 4.2 05/12/2022   CALCIUM 9.0 05/12/2022   ANIONGAP 8 12/28/2021   EGFR 85 05/12/2022   Lab Results  Component Value Date   CHOL 238 (H) 05/12/2022   Lab Results  Component Value Date   HDL 30 (L) 05/12/2022   Lab Results  Component Value Date   LDLCALC 132 (H) 05/12/2022   Lab Results  Component Value Date   TRIG 417 (H) 05/12/2022   Lab Results  Component Value Date   CHOLHDL 7.9 (H) 05/12/2022   Lab Results  Component Value Date   HGBA1C 6.1 03/16/2023      Assessment & Plan:   Problem List Items Addressed This Visit    Diabetes mellitus ([place]) Lab Results  Component Value Date   HGBA1C 6.1 03/16/2023    Well-controlled Associated with HTN and HLD On metformin 500 mg daily Advised to follow diabetic diet On ARB and statin, needs to be compliant F/u CMP, HbA1c and lipid panel Diabetic eye exam: Advised to follow up with Ophthalmology for diabetic eye exam  HTN (hypertension) BP Readings from Last 1 Encounters:   03/16/23 134/79   Well controlled with telmisartan 40 mg daily Counseled for compliance with the medications Advised DASH diet and moderate exercise/walking, at least 150 mins/week  Mixed hyperlipidemia Has started Crestor now Check lipid profile  Morbid obesity (HCC) BMI Readings from Last 1 Encounters:  03/16/23 32.71 kg/m   With HLD, depression and chronic pain Diet modification and moderate exercise advised Has lost 22 lbs with low carb diet  Tobacco abuse Smokes about 0.5 pack/day  Asked about quitting: confirms that he/she currently smokes cigarettes Advise to quit smoking: Educated about QUITTING to reduce the risk of cancer, cardio and cerebrovascular disease. Assess willingness: Unwilling to quit at this time, but is working on cutting back. Assist with counseling and pharmacotherapy: Counseled for 5 minutes and literature provided. Arrange for follow up: follow up in 3 months and  continue to offer help.  Bipolar affective disorder ([place]) Has had inpatient Psychiatric treatment for acute mania Appears to be more stable now Now on Lamictal, Atarax, BuSpar and Cymbalta - explained to the patient about importance of compliance to the treatment. Follow up with [facility] therapist and Psychiatry - lost f/u, new referral placed  Anxiety Atarax PRN, refilled     Meds ordered this encounter  Medications   telmisartan (MICARDIS) 40 MG tablet    Sig: Take 1 tablet (40 mg total) by mouth daily.    Dispense:  90 tablet    Refill:  1   rosuvastatin (CRESTOR) 10 MG tablet    Sig: Take 1 tablet (10 mg total) by mouth daily.    Dispense:  90 tablet    Refill:  1   DULoxetine (CYMBALTA) 60 MG capsule    Sig: Take 1 capsule (60 mg total) by mouth daily.    Dispense:  30 capsule    Refill:  3   busPIRone (BUSPAR) 7.5 MG tablet    Sig: Take 1 tablet (7.5 mg total) by mouth daily.    Dispense:  30 tablet    Refill:  3   hydrOXYzine (VISTARIL) 25 MG capsule    Sig: TAKE 1  CAPSULE BY MOUTH TWICE DAILY AS NEEDED FOR ANXIETY    Dispense:  60 capsule    Refill:  3   Blood Glucose Monitoring Suppl DEVI    Sig: 1 each by Does not apply route in the morning, at noon, and at bedtime. May substitute to any manufacturer covered by patient's insurance.    Dispense:  1 each    Refill:  0   Glucose Blood (BLOOD GLUCOSE TEST STRIPS) STRP    Sig: 1 each by In Vitro route in the morning, at noon, and at bedtime. May substitute to any manufacturer covered by patient's insurance.    Dispense:  100 strip    Refill:  0   Lancet Device MISC    Sig: 1 each by Does not apply route in the morning, at noon, and at bedtime. May substitute to any manufacturer covered by patient's insurance.    Dispense:  1 each    Refill:  0   Lancets Misc. MISC    Sig: 1 each by Does not apply route in the morning, at noon, and at bedtime. May substitute to any manufacturer covered by patient's insurance.    Dispense:  100 each    Refill:  0    Follow-up: Return in about 3 months (around 06/16/2023) for Annual physical.    Lindell Spar, MD

## 2023-03-16 NOTE — Assessment & Plan Note (Signed)
Smokes about 0.5 pack/day ? ?Asked about quitting: confirms that he/she currently smokes cigarettes ?Advise to quit smoking: Educated about QUITTING to reduce the risk of cancer, cardio and cerebrovascular disease. ?Assess willingness: Unwilling to quit at this time, but is working on cutting back. ?Assist with counseling and pharmacotherapy: Counseled for 5 minutes and literature provided. ?Arrange for follow up: follow up in 3 months and continue to offer help. ?

## 2023-03-16 NOTE — Assessment & Plan Note (Signed)
BP Readings from Last 1 Encounters:  03/16/23 134/79   Well controlled with telmisartan 40 mg daily Counseled for compliance with the medications Advised DASH diet and moderate exercise/walking, at least 150 mins/week

## 2023-03-16 NOTE — Assessment & Plan Note (Addendum)
Lab Results  Component Value Date   HGBA1C 6.1 03/16/2023    Well-controlled Associated with HTN and HLD On metformin 500 mg daily Advised to follow diabetic diet On ARB and statin, needs to be compliant F/u CMP, HbA1c and lipid panel Diabetic eye exam: Advised to follow up with Ophthalmology for diabetic eye exam

## 2023-03-16 NOTE — Patient Instructions (Addendum)
Please continue to take medications as prescribed.  Please continue to follow low carb diet and perform moderate exercise/walking at least 150 mins/week.  You are being referred to Psychiatry.  Please get fasting blood tests done before the next visit.

## 2023-03-16 NOTE — Assessment & Plan Note (Signed)
Has had inpatient Psychiatric treatment for acute mania Appears to be more stable now Now on Lamictal, Atarax, BuSpar and Cymbalta - explained to the patient about importance of compliance to the treatment. Follow up with [facility] therapist and Psychiatry - lost f/u, new referral placed

## 2023-03-16 NOTE — Assessment & Plan Note (Signed)
Has started Crestor now Check lipid profile

## 2023-03-22 ENCOUNTER — Ambulatory Visit (INDEPENDENT_AMBULATORY_CARE_PROVIDER_SITE_OTHER): Payer: Medicare PPO | Admitting: Licensed Clinical Social Worker

## 2023-03-22 DIAGNOSIS — Z8659 Personal history of other mental and behavioral disorders: Secondary | ICD-10-CM

## 2023-03-28 NOTE — BH Specialist Note (Signed)
Integrated Behavioral Health via Telemedicine Visit  03/28/2023 [name] [name] [id number]  Number of [place] Clinician visits: 1 Session Start time:  1015am Session End time: 1030am Total time in minutes: 15 mins via phone  Referring Provider: Dr. Posey Pronto  Patient/Family location: Home  [facility] Provider location: [place]  All persons participating in visit: Pt [name] [name] and LCSW  A Linton Rump  Types of Service: Telephone visit and [place] ([facility])  I connected with [name] [name] and/or [name] [name]'s n/a via  Telephone or Video Enabled Telemedicine Application  (Video is Caregility application) and verified that I am speaking with the correct person using two identifiers. Discussed confidentiality: Yes   I discussed the limitations of telemedicine and the availability of in person appointments.  Discussed there is a possibility of technology failure and discussed alternative modes of communication if that failure occurs.  I discussed that engaging in this telemedicine visit, they consent to the provision of behavioral healthcare and the services will be billed under their insurance.  Patient and/or legal guardian expressed understanding and consented to Telemedicine visit: Yes   Presenting Concerns: Patient and/or family reports the following symptoms/concerns: medication mgmt  Duration of problem: n/a; Severity of problem: n/a  Patient and/or Family's Strengths/Protective Factors: Concrete supports in place (healthy food, safe environments, etc.)  Goals Addressed: Patient will:  Reduce symptoms of: anxiety   Increase knowledge and/or ability of: coping skills   Demonstrate ability to: Increase healthy adjustment to current life circumstances  Progress towards Goals: Ongoing  Interventions: Interventions utilized:  Link to [employer] Standardized Assessments completed: Not Needed  Patient and/or Family Response: [name] reports not  interested in therapy and thought this appt was for medication mgmt. [name] reports he is comfortable with virtual visits.   Assessment: Patient reports history of bipolar depression.   Patient prefers in person appointment with psychiatry.  Plan: Follow up with behavioral health clinician on : as needed Behavioral recommendations: Collaborate with psychiatry Referral(s): Psychiatrist  I discussed the assessment and treatment plan with the patient and/or parent/guardian. They were provided an opportunity to ask questions and all were answered. They agreed with the plan and demonstrated an understanding of the instructions.   They were advised to call back or seek an in-person evaluation if the symptoms worsen or if the condition fails to improve as anticipated.  Lynnea Ferrier, LCSW

## 2023-04-04 ENCOUNTER — Other Ambulatory Visit: Payer: Self-pay | Admitting: Internal Medicine

## 2023-04-04 DIAGNOSIS — E1169 Type 2 diabetes mellitus with other specified complication: Secondary | ICD-10-CM

## 2023-04-18 ENCOUNTER — Telehealth: Payer: Self-pay | Admitting: Internal Medicine

## 2023-04-18 ENCOUNTER — Other Ambulatory Visit: Payer: Self-pay | Admitting: Internal Medicine

## 2023-04-18 DIAGNOSIS — E1169 Type 2 diabetes mellitus with other specified complication: Secondary | ICD-10-CM

## 2023-04-18 NOTE — Telephone Encounter (Signed)
Error

## 2023-04-19 ENCOUNTER — Telehealth: Payer: Self-pay | Admitting: Internal Medicine

## 2023-04-19 ENCOUNTER — Other Ambulatory Visit: Payer: Self-pay

## 2023-04-19 DIAGNOSIS — E1169 Type 2 diabetes mellitus with other specified complication: Secondary | ICD-10-CM

## 2023-04-19 MED ORDER — ACCU-CHEK GUIDE VI STRP
ORAL_STRIP | 0 refills | Status: DC
Start: 2023-04-19 — End: 2024-08-06

## 2023-04-19 NOTE — Telephone Encounter (Signed)
Patient called pharmacy not received this med yet ACCU-CHEK GUIDE test strip   Pharmacy  [facility] [address] [place] - [address], [place] [address] Phone: [phone]  Fax: [phone] DEA #: --  DAW Reason: --

## 2023-04-19 NOTE — Telephone Encounter (Signed)
Refill sent.

## 2023-05-04 ENCOUNTER — Encounter: Payer: Self-pay | Admitting: Internal Medicine

## 2023-05-04 ENCOUNTER — Ambulatory Visit (INDEPENDENT_AMBULATORY_CARE_PROVIDER_SITE_OTHER): Payer: Medicare PPO | Admitting: Internal Medicine

## 2023-05-04 DIAGNOSIS — Z0000 Encounter for general adult medical examination without abnormal findings: Secondary | ICD-10-CM | POA: Diagnosis not present

## 2023-05-04 NOTE — Progress Notes (Signed)
Subjective:  This is a telephone encounter between [name] [name] and [name] [name] on 05/04/2023 for AWV. The visit was conducted with the patient located at home and [name] [name] at [facility]. The patient's identity was confirmed using their DOB and current address. The patient has consented to being evaluated through a telephone encounter and understands the associated risks (an examination cannot be done and the patient may need to come in for an appointment) / benefits (allows the patient to remain at home, decreasing exposure to coronavirus).     [name] [name] is a 57 y.o. male who presents for Medicare Annual/Subsequent preventive examination.  Review of Systems    Review of Systems  All other systems reviewed and are negative.    Objective:    There were no vitals filed for this visit. There is no height or weight on file to calculate BMI.     05/04/2023    3:06 PM 12/30/2021    2:23 PM 12/25/2021    6:31 AM 12/23/2021   10:37 AM 12/04/2021   10:20 AM 10/21/2021    8:42 AM 10/19/2021   12:02 PM  Advanced Directives  Does Patient Have a Medical Advance Directive? No No No No No No No  Would patient like information on creating a medical advance directive? Yes (MAU/Ambulatory/Procedural Areas - Information given) Yes (ED - Information included in AVS) No - Patient declined No - Patient declined No - Patient declined No - Patient declined No - Patient declined    Current Medications (verified) Outpatient Encounter Medications as of 05/04/2023  Medication Sig   acetaminophen (TYLENOL) 500 MG tablet Take 500 mg by mouth every 6 (six) hours as needed for moderate pain.   Blood Glucose Monitoring Suppl DEVI 1 each by Does not apply route in the morning, at noon, and at bedtime. May substitute to any manufacturer covered by patient's insurance.   busPIRone (BUSPAR) 7.5 MG tablet Take 1 tablet (7.5 mg total) by mouth daily.   cyclobenzaprine (FLEXERIL) 10 MG tablet Take 10 mg by mouth 2 (two)  times daily.   DULoxetine (CYMBALTA) 60 MG capsule Take 1 capsule (60 mg total) by mouth daily.   gabapentin (NEURONTIN) 600 MG tablet Take 600 mg by mouth every 8 (eight) hours.   glucose blood (ACCU-CHEK GUIDE) test strip USE 1 TEST STRIP IN THE MORNING, AT NOON, AND AT BEDTIME   hydrOXYzine (VISTARIL) 25 MG capsule TAKE 1 CAPSULE BY MOUTH TWICE DAILY AS NEEDED FOR ANXIETY   lamoTRIgine (LAMICTAL) 25 MG tablet Take 2 tablets by mouth once daily   metFORMIN (GLUCOPHAGE) 500 MG tablet Take 1 tablet by mouth once daily with breakfast   rosuvastatin (CRESTOR) 10 MG tablet Take 1 tablet (10 mg total) by mouth daily.   telmisartan (MICARDIS) 40 MG tablet Take 1 tablet (40 mg total) by mouth daily.   traMADol (ULTRAM) 50 MG tablet Take 50 mg by mouth 4 (four) times daily.   [DISCONTINUED] asenapine (SAPHRIS) 5 MG SUBL 24 hr tablet Place 1 tablet (5 mg total) under the tongue at bedtime.   [DISCONTINUED] carbamazepine (TEGRETOL) 100 MG chewable tablet Take one a day, generic   [DISCONTINUED] diphenhydrAMINE (BENADRYL) 50 MG tablet Take 1 tablet (50 mg total) by mouth as directed. Take 50 mg of benadryl 1 hour before your CT scan   [DISCONTINUED] pregabalin (LYRICA) 75 MG capsule Take 75 mg by mouth 3 (three) times daily.   [DISCONTINUED] risperiDONE (RISPERDAL) 2 MG tablet Take at night  No facility-administered encounter medications on file as of 05/04/2023.    Allergies (verified) Codeine, Divalproex sodium, Erythromycin, Iodinated contrast media, Latex, Morphine and related, Sulfa antibiotics, Wheat, Demerol [meperidine], Levofloxacin, Niacin and related, and Penicillins   History: Past Medical History:  Diagnosis Date   Anxiety    Bipolar 1 disorder (HCC)    Chronic fatigue    Chronic pain    Colostomy in place (HCC) 09/27/2019   Complete intestinal obstruction (HCC)    Depression    Phreesia 12/03/2020   Fecal peritonitis (HCC) 08/03/2019   Fibromyalgia    Hernia of abdominal wall     Hyperlipidemia    Phreesia 12/03/2020   Hypertension    Ileus, postoperative (HCC)    Perforated rectum (HCC) 08/03/2019   Rectal perforation (HCC) 12/23/2019   Sleep apnea    Past Surgical History:  Procedure Laterality Date   COLON RESECTION SIGMOID  08/03/2019   Procedure: COLON RESECTION SIGMOID;  Surgeon: Lucretia Roers, MD;  Location: [facility];  Service: General;;   COLONOSCOPY WITH PROPOFOL N/A 10/21/2021   Procedure: COLONOSCOPY WITH PROPOFOL;  Surgeon: Malissa Hippo, MD;  Location: [facility];  Service: Endoscopy;  Laterality: N/A;  955   COLOSTOMY N/A 08/03/2019   Procedure: COLOSTOMY;  Surgeon: Lucretia Roers, MD;  Location: [facility];  Service: General;  Laterality: N/A;   COLOSTOMY REVERSAL N/A 12/25/2021   Procedure: COLOSTOMY REVERSAL;  Surgeon: Lucretia Roers, MD;  Location: [facility];  Service: General;  Laterality: N/A;   FLEXIBLE SIGMOIDOSCOPY N/A 08/03/2019   Procedure: FLEXIBLE SIGMOIDOSCOPY;  Surgeon: Corbin Ade, MD;  Location: [facility];  Service: Endoscopy;  Laterality: N/A;   INCISIONAL HERNIA REPAIR N/A 12/25/2021   Procedure: INCISIONAL HERNIA REPAIR WITH ABSORBABLE MESH;  Surgeon: Lucretia Roers, MD;  Location: [facility];  Service: General;  Laterality: N/A;   KNEE ARTHROSCOPY Left 2006   miniscus tear   LAPAROTOMY N/A 08/03/2019   Procedure: EXPLORATORY LAPAROTOMY;  Surgeon: Lucretia Roers, MD;  Location: [facility];  Service: General;  Laterality: N/A;   LYMPH GLAND EXCISION Left 1983   PARASTOMAL HERNIA REPAIR Left 12/25/2021   Procedure: PARASTOMAL HERNIA REPAIR WITH ABSORBABLE MESH;  Surgeon: Lucretia Roers, MD;  Location: [facility];  Service: General;  Laterality: Left;   SMALL INTESTINE SURGERY N/A    Phreesia 12/03/2020   TONSILLECTOMY     Family History  Problem Relation Age of Onset   Diabetes Mother    Heart attack Father    Diabetes Maternal Grandmother    Colon cancer Neg Hx    Colon polyps Neg Hx    Social History    Socioeconomic History   Marital status: Single    Spouse name: [name]   Number of children: 0   Years of education: 12   Highest education level: Some college, no degree  Occupational History   Not on file  Tobacco Use   Smoking status: Every Day    Packs/day: 1.00    Years: 10.00    Additional pack years: 0.00    Total pack years: 10.00    Types: Cigarettes   Smokeless tobacco: Never  Vaping Use   Vaping Use: Never used  Substance and Sexual Activity   Alcohol use: Not Currently    Comment: denied 10/02/19   Drug use: Not Currently    Types: Cocaine    Comment: last used in December 2020.    Sexual activity: Not Currently  Other Topics  Concern   Not on file  Social History Narrative   Pt lives alone and is on disability. Counts his mother, sister and aunt as his social supports.    Social Determinants of Health   Financial Resource Strain: Low Risk  (12/30/2021)   Overall Financial Resource Strain (CARDIA)    Difficulty of Paying Living Expenses: Not hard at all  Food Insecurity: No Food Insecurity (12/30/2021)   Hunger Vital Sign    Worried About Running Out of Food in the Last Year: Never true    Ran Out of Food in the Last Year: Never true  Transportation Needs: Unmet Transportation Needs (12/30/2021)   PRAPARE - Administrator, Civil Service (Medical): Not on file    Lack of Transportation (Non-Medical): Yes  Physical Activity: Inactive (12/30/2021)   Exercise Vital Sign    Days of Exercise per Week: 0 days    Minutes of Exercise per Session: 0 min  Stress: No Stress Concern Present (12/30/2021)   [employer] of Occupational Health - Occupational Stress Questionnaire    Feeling of Stress : Only a little  Social Connections: Moderately Integrated (12/30/2021)   Social Connection and Isolation Panel [NHANES]    Frequency of Communication with Friends and Family: More than three times a week    Frequency of Social Gatherings with Friends and Family: Once  a week    Attends Religious Services: 1 to 4 times per year    Active Member of [employer] or Organizations: No    Attends Engineer, structural: Never    Marital Status: Married    Tobacco Counseling Ready to quit: Not Answered Counseling given: Not Answered   Clinical Intake:  Pre-visit preparation completed: Yes  Pain : No/denies pain     Diabetes: Yes CBG done?: No Did pt. bring in CBG monitor from home?: No  How often do you need to have someone help you when you read instructions, pamphlets, or other written materials from your doctor or pharmacy?: 1 - Never What is the last grade level you completed in school?: 12th grade, 3 years of college   Activities of Daily Living    05/04/2023    3:08 PM  In your present state of health, do you have any difficulty performing the following activities:  Hearing? 0  Vision? 0  Difficulty concentrating or making decisions? 0  Walking or climbing stairs? 0  Dressing or bathing? 0  Doing errands, shopping? 0    Patient Care Team: Anabel Halon, MD as PCP - General (Internal Medicine) Fredricka Bonine, PTA as Physical Therapy Assistant (Physical Therapy) Kellie Shropshire, PT (Inactive) as Physical Therapist (Physical Therapy)  Indicate any recent Medical Services you may have received from other than [facility] providers in the past year (date may be approximate).     Assessment:   This is a routine wellness examination for [employer].  Hearing/Vision screen No results found.  Dietary issues and exercise activities discussed:     Goals Addressed   None    Depression Screen    05/04/2023    3:08 PM 03/16/2023    9:06 AM 10/12/2022    1:07 PM 09/13/2022    1:06 PM 05/12/2022    1:07 PM 03/31/2022    4:13 PM 02/08/2022    8:33 AM  PHQ 2/9 Scores  PHQ - 2 Score 0 0 0 0 0 0 0    Fall Risk    05/04/2023    3:08 PM 03/16/2023  9:06 AM 10/12/2022    1:07 PM 09/13/2022    1:06 PM 05/12/2022    1:07 PM  Fall Risk    Falls in the past year? 0 0 0 0 0  Number falls in past yr:  0 0 0 0  Injury with Fall?  0 0 0 0  Risk for fall due to :   No Fall Risks No Fall Risks No Fall Risks  Follow up   Falls evaluation completed Falls evaluation completed Falls evaluation completed    FALL RISK PREVENTION PERTAINING TO THE HOME:  Any stairs in or around the home? Yes  If so, are there any without handrails? Yes  Home free of loose throw rugs in walkways, pet beds, electrical cords, etc? Yes  Adequate lighting in your home to reduce risk of falls? Yes   ASSISTIVE DEVICES UTILIZED TO PREVENT FALLS:  Life alert? No  Use of a cane, walker or w/c? No  Grab bars in the bathroom? No  Shower chair or bench in shower? No  Elevated toilet seat or a handicapped toilet? No    Cognitive Function:        05/04/2023    3:09 PM 12/30/2021    2:25 PM  6CIT Screen  What Year? 0 points 0 points  What month? 0 points 0 points  What time? 0 points 0 points  Count back from 20 0 points 0 points  Months in reverse 0 points 0 points  Repeat phrase 0 points 0 points  Total Score 0 points 0 points    Immunizations Immunization History  Administered Date(s) Administered   Influenza,inj,Quad PF,6+ Mos 09/13/2022   Influenza-Unspecified 10/25/2020    TDAP status: Due, Education has been provided regarding the importance of this vaccine. Advised may receive this vaccine at local pharmacy or Health Dept. Aware to provide a copy of the vaccination record if obtained from local pharmacy or Health Dept. Verbalized acceptance and understanding.  Flu Vaccine status: Up to date  Pneumococcal vaccine status: Up to date  Covid-19 vaccine status: Information provided on how to obtain vaccines.   Qualifies for Shingles Vaccine? Yes   Zostavax completed No   Shingrix Completed?: No.    Education has been provided regarding the importance of this vaccine. Patient has been advised to call insurance company to determine out of  pocket expense if they have not yet received this vaccine. Advised may also receive vaccine at local pharmacy or Health Dept. Verbalized acceptance and understanding.  Screening Tests Health Maintenance  Topic Date Due   COVID-19 Vaccine (1) Never done   OPHTHALMOLOGY EXAM  Never done   DTaP/Tdap/Td (1 - Tdap) Never done   Zoster Vaccines- Shingrix (1 of 2) Never done   Medicare Annual Wellness (AWV)  12/30/2022   Diabetic kidney evaluation - eGFR measurement  05/13/2023   INFLUENZA VACCINE  07/28/2023   HEMOGLOBIN A1C  09/16/2023   Diabetic kidney evaluation - Urine ACR  10/13/2023   FOOT EXAM  10/13/2023   COLONOSCOPY (Pts 45-71yrs Insurance coverage will need to be confirmed)  10/22/2031   Hepatitis C Screening  Completed   HIV Screening  Completed   HPV VACCINES  Aged Out    Health Maintenance  Health Maintenance Due  Topic Date Due   COVID-19 Vaccine (1) Never done   OPHTHALMOLOGY EXAM  Never done   DTaP/Tdap/Td (1 - Tdap) Never done   Zoster Vaccines- Shingrix (1 of 2) Never done   [employer] Wellness (AWV)  12/30/2022   Diabetic kidney evaluation - eGFR measurement  05/13/2023    Colorectal cancer screening: Type of screening: Colonoscopy. Completed 10/21/2021. Repeat every 10 years  Lung Cancer Screening: (Low Dose CT Chest recommended if Age 15-80 years, 30 pack-year currently smoking OR have quit w/in 15years.) does not qualify.    Additional Screening:  Hepatitis C Screening: does not qualify; Completed 11/10/2007  Vision Screening: Recommended annual ophthalmology exams for early detection of glaucoma and other disorders of the eye. Is the patient up to date with their annual eye exam?  Yes  Who is the provider or what is the name of the office in which the patient attends annual eye exams? [facility] If pt is not established with a provider, would they like to be referred to a provider to establish care? No .   Dental Screening: Recommended  annual dental exams for proper oral hygiene  Community Resource Referral / Chronic Care Management: CRR required this visit?  No   CCM required this visit?  No      Plan:     I have personally reviewed and noted the following in the patient's chart:   Medical and social history Use of alcohol, tobacco or illicit drugs  Current medications and supplements including opioid prescriptions. Patient is not currently taking opioid prescriptions. Functional ability and status Nutritional status Physical activity Advanced directives List of other physicians Hospitalizations, surgeries, and ER visits in previous 12 months Vitals Screenings to include cognitive, depression, and falls Referrals and appointments  In addition, I have reviewed and discussed with patient certain preventive protocols, quality metrics, and best practice recommendations. A written personalized care plan for preventive services as well as general preventive health recommendations were provided to patient.     [name] Banister, MD   05/04/2023

## 2023-05-04 NOTE — Patient Instructions (Signed)
  [name] [name] , Thank you for taking time to come for your Medicare Wellness Visit. I appreciate your ongoing commitment to your health goals. Please review the following plan we discussed and let me know if I can assist you in the future.   These are the goals we discussed:  Goals      Set My Weight Loss Goal     Patient has made goal to get to 210 lbs. He is 89.  He has cut out all sweets and exercising daily.         This is a list of the screening recommended for you and due dates:  Health Maintenance  Topic Date Due   COVID-19 Vaccine (1) Never done   Eye exam for diabetics  Never done   DTaP/Tdap/Td vaccine (1 - Tdap) Never done   Zoster (Shingles) Vaccine (1 of 2) Never done   Medicare Annual Wellness Visit  12/30/2022   Yearly kidney function blood test for diabetes  05/13/2023   Flu Shot  07/28/2023   Hemoglobin A1C  09/16/2023   Yearly kidney health urinalysis for diabetes  10/13/2023   Complete foot exam   10/13/2023   Colon Cancer Screening  10/22/2031   Hepatitis C Screening: USPSTF Recommendation to screen - Ages 18-79 yo.  Completed   HIV Screening  Completed   HPV Vaccine  Aged Out

## 2023-05-19 ENCOUNTER — Other Ambulatory Visit: Payer: Self-pay

## 2023-05-19 ENCOUNTER — Telehealth: Payer: Self-pay | Admitting: Internal Medicine

## 2023-05-19 DIAGNOSIS — E1169 Type 2 diabetes mellitus with other specified complication: Secondary | ICD-10-CM

## 2023-05-19 MED ORDER — METFORMIN HCL 500 MG PO TABS
500.0000 mg | ORAL_TABLET | Freq: Every day | ORAL | 0 refills | Status: DC
Start: 2023-05-19 — End: 2023-09-15

## 2023-05-19 NOTE — Telephone Encounter (Signed)
Pt called and requested refills.    metFORMIN (GLUCOPHAGE) 500 MG tablet [[id number]]   [facility] [place]

## 2023-05-19 NOTE — Telephone Encounter (Signed)
Refills sent to pharmacy. 

## 2023-06-06 ENCOUNTER — Telehealth: Payer: Self-pay | Admitting: Internal Medicine

## 2023-06-06 NOTE — Telephone Encounter (Signed)
Mother advised.  

## 2023-06-06 NOTE — Telephone Encounter (Signed)
Mother came by office in regard to patient. Patient has been arrested and is now in custody.  Mother is asking if provider can write letter stating that patient needs to be ion behavioral health. Patient has not been taking medications and has not seen behavioral health.  Wants a call back.

## 2023-06-16 ENCOUNTER — Encounter: Payer: Medicare PPO | Admitting: Internal Medicine

## 2023-07-12 ENCOUNTER — Ambulatory Visit (INDEPENDENT_AMBULATORY_CARE_PROVIDER_SITE_OTHER): Payer: Medicare PPO | Admitting: Licensed Clinical Social Worker

## 2023-07-12 DIAGNOSIS — Z8659 Personal history of other mental and behavioral disorders: Secondary | ICD-10-CM | POA: Diagnosis not present

## 2023-07-12 NOTE — BH Specialist Note (Signed)
Integrated Behavioral Health via Telemedicine Visit  07/12/2023 [name] [name] [id number]  Number of Integrated Behavioral Health Clinician visits: 2 Session Start time:  9:50am Session End time: 10:27am Total time in minutes: 37 mins via phone. [name] [name] reports he not familiar with virtual visits and preferred phone visits.   Referring Provider: Dr. Allena Katz  Patient/Family location: Home  [facility] Provider location: [facility]  All persons participating in visit: [name] [name] and LCSW A. Gordy Goar  Types of Service: Individual psychotherapy and Telephone visit  I connected with [name] [name] and/or [name] [name] n/a via  Telephone or Video Enabled Telemedicine Application  (Video is Caregility application) and verified that I am speaking with the correct person using two identifiers. Discussed confidentiality: Yes   I discussed the limitations of telemedicine and the availability of in person appointments.  Discussed there is a possibility of technology failure and discussed alternative modes of communication if that failure occurs.  I discussed that engaging in this telemedicine visit, they consent to the provision of behavioral healthcare and the services will be billed under their insurance.  Patient and/or legal guardian expressed understanding and consented to Telemedicine visit: Yes   Presenting Concerns: Patient and/or family reports the following symptoms/concerns: history of bipolar disorder Duration of problem: n/a; Severity of problem: mild  Patient and/or Family's Strengths/Protective Factors: Concrete supports in place (healthy food, safe environments, etc.)  Goals Addressed: Patient will:  Reduce symptoms of: mood instability   Increase knowledge and/or ability of: coping skills   Demonstrate ability to: Increase healthy adjustment to current life circumstances  Progress towards Goals: Ongoing  Interventions: Interventions utilized:  Supportive Counseling Standardized  Assessments completed: Not Needed  Patient and/or Family Response: [name] [name] request refill for psychotropic medication. LCSW A Felton Clinton advise [name] [name] to fulfil request with pharmacy and Dr. Allena Katz will receive request. [name] [name] reports positive mood changes and self care.   Assessment: Patient currently experiencing history of bipolar disorder.   Patient may benefit from psychiatry.  Plan: Follow up with behavioral health clinician on : as needed  Behavioral recommendations: Contact pharmacy for prescription refills, engage in self care, and engage in positive social activities.  Referral(s): Integrated [employer] (In Clinic)  I discussed the assessment and treatment plan with the patient and/or parent/guardian. They were provided an opportunity to ask questions and all were answered. They agreed with the plan and demonstrated an understanding of the instructions.   They were advised to call back or seek an in-person evaluation if the symptoms worsen or if the condition fails to improve as anticipated.  Gwyndolyn Saxon, LCSW

## 2023-07-16 ENCOUNTER — Other Ambulatory Visit (HOSPITAL_COMMUNITY): Payer: Self-pay | Admitting: Psychiatry

## 2023-07-18 ENCOUNTER — Telehealth (HOSPITAL_COMMUNITY): Payer: Self-pay | Admitting: *Deleted

## 2023-07-18 NOTE — Telephone Encounter (Signed)
Rx Request -- [id number] - [place], [place] - [address]   lamoTRIgine (LAMICTAL) 25 MG tablet [[id number]]   Order Details  Dose: 50 mg Route: Oral Frequency: Daily  Dispense Quantity: 60 tablet Refills: 0        Sig: Take 2 tablets by mouth once daily       Last Appt--10/08/22  & 2 NO SHOWS 01/06/23 & 02/16/23 Next Appt--  07/25/23                  7 2  no shows

## 2023-07-25 ENCOUNTER — Ambulatory Visit (INDEPENDENT_AMBULATORY_CARE_PROVIDER_SITE_OTHER): Payer: Medicare PPO | Admitting: Internal Medicine

## 2023-07-25 ENCOUNTER — Encounter: Payer: Self-pay | Admitting: Internal Medicine

## 2023-07-25 VITALS — BP 113/75 | HR 88 | Ht 70.0 in | Wt 193.2 lb

## 2023-07-25 DIAGNOSIS — Z720 Tobacco use: Secondary | ICD-10-CM | POA: Diagnosis not present

## 2023-07-25 DIAGNOSIS — E559 Vitamin D deficiency, unspecified: Secondary | ICD-10-CM

## 2023-07-25 DIAGNOSIS — E1169 Type 2 diabetes mellitus with other specified complication: Secondary | ICD-10-CM | POA: Diagnosis not present

## 2023-07-25 DIAGNOSIS — Z125 Encounter for screening for malignant neoplasm of prostate: Secondary | ICD-10-CM | POA: Diagnosis not present

## 2023-07-25 DIAGNOSIS — Z0001 Encounter for general adult medical examination with abnormal findings: Secondary | ICD-10-CM

## 2023-07-25 DIAGNOSIS — F312 Bipolar disorder, current episode manic severe with psychotic features: Secondary | ICD-10-CM | POA: Diagnosis not present

## 2023-07-25 DIAGNOSIS — F419 Anxiety disorder, unspecified: Secondary | ICD-10-CM

## 2023-07-25 DIAGNOSIS — G894 Chronic pain syndrome: Secondary | ICD-10-CM | POA: Diagnosis not present

## 2023-07-25 DIAGNOSIS — I10 Essential (primary) hypertension: Secondary | ICD-10-CM

## 2023-07-25 DIAGNOSIS — E782 Mixed hyperlipidemia: Secondary | ICD-10-CM | POA: Diagnosis not present

## 2023-07-25 MED ORDER — LAMOTRIGINE 25 MG PO TABS: 50.0000 mg | ORAL_TABLET | Freq: Every day | ORAL | 3 refills | Status: DC

## 2023-07-25 MED ORDER — HYDROXYZINE PAMOATE 25 MG PO CAPS: ORAL_CAPSULE | ORAL | 3 refills | Status: DC

## 2023-07-25 MED ORDER — DULOXETINE HCL 60 MG PO CPEP: 60.0000 mg | ORAL_CAPSULE | Freq: Every day | ORAL | 3 refills | Status: DC

## 2023-07-25 MED ORDER — BUSPIRONE HCL 7.5 MG PO TABS: 7.5000 mg | ORAL_TABLET | Freq: Every day | ORAL | 3 refills | Status: DC

## 2023-07-25 NOTE — Patient Instructions (Signed)
Please continue to take medications as prescribed.  Please continue to follow low carb diet and perform moderate exercise/walking at least 150 mins/week.  Please consider getting Shingrix and Tdap vaccine at local pharmacy. 

## 2023-07-25 NOTE — Assessment & Plan Note (Signed)
Physical exam as documented. Fasting blood tests today. Advised to get Shingrix and Tdap vaccines at local pharmacy. 

## 2023-07-25 NOTE — Assessment & Plan Note (Signed)
BP Readings from Last 1 Encounters:  07/25/23 113/75   Well controlled with telmisartan 40 mg daily Counseled for compliance with the medications Advised DASH diet and moderate exercise/walking, at least 150 mins/week

## 2023-07-25 NOTE — Assessment & Plan Note (Addendum)
Overall well controlled On Cymbalta and buspirone Atarax PRN, refilled

## 2023-07-25 NOTE — Assessment & Plan Note (Signed)
Has had inpatient Psychiatric treatment for acute mania in the past Appears to be more stable now Now on Lamictal, Atarax, BuSpar and Cymbalta - explained to the patient about importance of compliance to the treatment. Follow up with [facility] therapist and Psychiatry - lost f/u, new referral had been placed, now sees [facility] therapist, but does not have psychiatrist Preferably he needs in person psychiatry, which has been challenging to find

## 2023-07-25 NOTE — Assessment & Plan Note (Signed)
Ordered PSA after discussing its limitations for prostate cancer screening, including false positive results leading to additional investigations. 

## 2023-07-25 NOTE — Assessment & Plan Note (Signed)
On gabapentin, Cymbalta and tramadol as needed Used to be followed by pain clinic [facility]

## 2023-07-25 NOTE — Assessment & Plan Note (Signed)
Lab Results  Component Value Date   HGBA1C 6.1 03/16/2023    Well-controlled Associated with HTN and HLD On metformin 500 mg daily Advised to follow diabetic diet On ARB and statin, needs to be compliant F/u CMP, HbA1c and lipid panel Diabetic eye exam: Advised to follow up with Ophthalmology for diabetic eye exam

## 2023-07-25 NOTE — Assessment & Plan Note (Signed)
Has started Crestor now Check lipid profile

## 2023-07-25 NOTE — Assessment & Plan Note (Signed)
Smokes about 0.5 pack/day  Asked about quitting: confirms that he/she currently smokes cigarettes Advise to quit smoking: Educated about QUITTING to reduce the risk of cancer, cardio and cerebrovascular disease. Assess willingness: Unwilling to quit at this time, but is working on cutting back. Assist with counseling and pharmacotherapy: Counseled for 5 minutes and literature provided. Arrange for follow up: follow up in 3 months and continue to offer help. 

## 2023-07-25 NOTE — Progress Notes (Signed)
Established Patient Office Visit  Subjective:  Patient ID: [name] [name], male    DOB: 08/04/1966  Age: 57 y.o. MRN: [id number]  CC:  Chief Complaint  Patient presents with   Annual Exam    HPI [name] [name] is a 57 y.o. male with past medical history of bipolar disorder, chronic pain syndrome, polysubstance abuse, and perforated rectum s/p partial colectomy and colostomy who presents for annual physical.  HTN: BP is well-controlled. Takes medications regularly. Patient denies headache, dizziness, chest pain, dyspnea or palpitations.   Type II DM: He has been taking metformin now.  His blood glucose had been around 90-120 at home.  He denies any polyuria or polyphagia currently. He has lost 22 lbs with low-carb diet.   Bipolar disorder: He has lost f/u with Dr. Gilmore Laroche now.  He was doing well with Lamictal, Cymbalta, BuSpar and Vistaril.  He still has his medicines, but needs refills.  He currently denies any SI or HI.  He is currently living by himself.  He was in jail for a month as his wife had accused him of domestic violence.     Past Medical History:  Diagnosis Date   Anxiety    Bipolar 1 disorder (HCC)    Chronic fatigue    Chronic pain    Colostomy in place [facility]) 09/27/2019   Complete intestinal obstruction (HCC)    Depression    Phreesia 12/03/2020   Fecal peritonitis (HCC) 08/03/2019   Fibromyalgia    Hernia of abdominal wall    Hyperlipidemia    Phreesia 12/03/2020   Hypertension    Ileus, postoperative (HCC)    Perforated rectum (HCC) 08/03/2019   Rectal perforation (HCC) 12/23/2019   Sleep apnea     Past Surgical History:  Procedure Laterality Date   COLON RESECTION SIGMOID  08/03/2019   Procedure: COLON RESECTION SIGMOID;  Surgeon: Lucretia Roers, MD;  Location: [facility];  Service: General;;   COLONOSCOPY WITH PROPOFOL N/A 10/21/2021   Procedure: COLONOSCOPY WITH PROPOFOL;  Surgeon: Malissa Hippo, MD;  Location: [facility];  Service: Endoscopy;   Laterality: N/A;  955   COLOSTOMY N/A 08/03/2019   Procedure: COLOSTOMY;  Surgeon: Lucretia Roers, MD;  Location: [facility];  Service: General;  Laterality: N/A;   COLOSTOMY REVERSAL N/A 12/25/2021   Procedure: COLOSTOMY REVERSAL;  Surgeon: Lucretia Roers, MD;  Location: [facility];  Service: General;  Laterality: N/A;   FLEXIBLE SIGMOIDOSCOPY N/A 08/03/2019   Procedure: FLEXIBLE SIGMOIDOSCOPY;  Surgeon: Corbin Ade, MD;  Location: [facility];  Service: Endoscopy;  Laterality: N/A;   INCISIONAL HERNIA REPAIR N/A 12/25/2021   Procedure: INCISIONAL HERNIA REPAIR WITH ABSORBABLE MESH;  Surgeon: Lucretia Roers, MD;  Location: [facility];  Service: General;  Laterality: N/A;   KNEE ARTHROSCOPY Left 2006   miniscus tear   LAPAROTOMY N/A 08/03/2019   Procedure: EXPLORATORY LAPAROTOMY;  Surgeon: Lucretia Roers, MD;  Location: [facility];  Service: General;  Laterality: N/A;   LYMPH GLAND EXCISION Left 1983   PARASTOMAL HERNIA REPAIR Left 12/25/2021   Procedure: PARASTOMAL HERNIA REPAIR WITH ABSORBABLE MESH;  Surgeon: Lucretia Roers, MD;  Location: [facility];  Service: General;  Laterality: Left;   SMALL INTESTINE SURGERY N/A    Phreesia 12/03/2020   TONSILLECTOMY      Family History  Problem Relation Age of Onset   Diabetes Mother    Heart attack Father    Diabetes Maternal Grandmother  Colon cancer Neg Hx    Colon polyps Neg Hx     Social History   Socioeconomic History   Marital status: Single    Spouse name: [name]   Number of children: 0   Years of education: 12   Highest education level: Some college, no degree  Occupational History   Not on file  Tobacco Use   Smoking status: Every Day    Current packs/day: 1.00    Average packs/day: 1 pack/day for 20.6 years (20.6 ttl pk-yrs)    Types: Cigarettes    Start date: 2004   Smokeless tobacco: Never  Vaping Use   Vaping status: Never Used  Substance and Sexual Activity   Alcohol use: Not Currently    Comment: denied  10/02/19   Drug use: Not Currently    Types: Cocaine    Comment: last used in December 2020.    Sexual activity: Not Currently  Other Topics Concern   Not on file  Social History Narrative   Pt lives alone and is on disability. Counts his mother, sister and aunt as his social supports.    Social Determinants of Health   Financial Resource Strain: Low Risk  (12/30/2021)   Overall Financial Resource Strain (CARDIA)    Difficulty of Paying Living Expenses: Not hard at all  Food Insecurity: No Food Insecurity (12/30/2021)   Hunger Vital Sign    Worried About Running Out of Food in the Last Year: Never true    Ran Out of Food in the Last Year: Never true  Transportation Needs: Unmet Transportation Needs (12/30/2021)   PRAPARE - Administrator, Civil Service (Medical): Not on file    Lack of Transportation (Non-Medical): Yes  Physical Activity: Inactive (12/30/2021)   Exercise Vital Sign    Days of Exercise per Week: 0 days    Minutes of Exercise per Session: 0 min  Stress: No Stress Concern Present (12/30/2021)   [employer] of Occupational Health - Occupational Stress Questionnaire    Feeling of Stress : Only a little  Social Connections: Moderately Integrated (12/30/2021)   Social Connection and Isolation Panel [NHANES]    Frequency of Communication with Friends and Family: More than three times a week    Frequency of Social Gatherings with Friends and Family: Once a week    Attends Religious Services: 1 to 4 times per year    Active Member of [employer] or Organizations: No    Attends Banker Meetings: Never    Marital Status: Married  Catering manager Violence: Not At Risk (12/30/2021)   Humiliation, Afraid, Rape, and Kick questionnaire    Fear of Current or Ex-Partner: No    Emotionally Abused: No    Physically Abused: No    Sexually Abused: No    Outpatient Medications Prior to Visit  Medication Sig Dispense Refill   acetaminophen (TYLENOL) 500 MG tablet  Take 500 mg by mouth every 6 (six) hours as needed for moderate pain.     Blood Glucose Monitoring Suppl DEVI 1 each by Does not apply route in the morning, at noon, and at bedtime. May substitute to any manufacturer covered by patient's insurance. 1 each 0   cyclobenzaprine (FLEXERIL) 10 MG tablet Take 10 mg by mouth 2 (two) times daily.     gabapentin (NEURONTIN) 600 MG tablet Take 600 mg by mouth every 8 (eight) hours.     glucose blood (ACCU-CHEK GUIDE) test strip USE 1 TEST STRIP IN THE MORNING,  AT NOON, AND AT BEDTIME 100 each 0   metFORMIN (GLUCOPHAGE) 500 MG tablet Take 1 tablet (500 mg total) by mouth daily with breakfast. 90 tablet 0   rosuvastatin (CRESTOR) 10 MG tablet Take 1 tablet (10 mg total) by mouth daily. 90 tablet 1   telmisartan (MICARDIS) 40 MG tablet Take 1 tablet (40 mg total) by mouth daily. 90 tablet 1   traMADol (ULTRAM) 50 MG tablet Take 50 mg by mouth 4 (four) times daily.     busPIRone (BUSPAR) 7.5 MG tablet Take 1 tablet (7.5 mg total) by mouth daily. 30 tablet 3   DULoxetine (CYMBALTA) 60 MG capsule Take 1 capsule (60 mg total) by mouth daily. 30 capsule 3   hydrOXYzine (VISTARIL) 25 MG capsule TAKE 1 CAPSULE BY MOUTH TWICE DAILY AS NEEDED FOR ANXIETY 60 capsule 3   lamoTRIgine (LAMICTAL) 25 MG tablet Take 2 tablets by mouth once daily 60 tablet 0   No facility-administered medications prior to visit.    Allergies  Allergen Reactions   Codeine Shortness Of Breath and Swelling   Divalproex Sodium Diarrhea, Itching, Rash, Shortness Of Breath and Swelling   Erythromycin Hives, Itching, Rash and Swelling   Iodinated Contrast Media Swelling    Patient states he received "red Dye" 15-20 years ago and become red/flushed. No other symptoms. Patient did do 13 hour pre meds for CT 09/17/2021   Latex Itching and Rash   Morphine And Codeine Shortness Of Breath and Swelling   Sulfa Antibiotics Diarrhea, Itching, Rash, Shortness Of Breath and Swelling   Wheat Hives,  Itching, Rash, Shortness Of Breath and Swelling   Demerol [Meperidine]     Body over heats and pouring sweat   Levofloxacin     Pt does not recall reaction    Niacin And Related Rash   Penicillins Swelling and Rash    REACTION: Rash and facial swelling at age 3    ROS Review of Systems  Constitutional:  Negative for chills and fever.  HENT:  Negative for congestion and sore throat.   Eyes:  Negative for pain and discharge.  Respiratory:  Negative for cough and shortness of breath.   Cardiovascular:  Negative for chest pain and palpitations.  Gastrointestinal:  Negative for constipation, diarrhea, nausea and vomiting.  Endocrine: Negative for polydipsia and polyuria.  Genitourinary:  Negative for dysuria and hematuria.  Musculoskeletal:  Positive for arthralgias, back pain and myalgias. Negative for neck pain and neck stiffness.  Skin:  Negative for rash.  Neurological:  Negative for dizziness, weakness, numbness and headaches.  Psychiatric/Behavioral:  Positive for decreased concentration and sleep disturbance. Negative for agitation and behavioral problems. The patient is nervous/anxious.       Objective:    Physical Exam Vitals reviewed.  Constitutional:      General: He is not in acute distress.    Appearance: He is not diaphoretic.  HENT:     Head: Normocephalic.     Nose: Nose normal.     Mouth/Throat:     Mouth: Mucous membranes are moist.     Dentition: Abnormal dentition.  Eyes:     General: No scleral icterus.    Extraocular Movements: Extraocular movements intact.  Cardiovascular:     Rate and Rhythm: Normal rate and regular rhythm.     Pulses: Normal pulses.     Heart sounds: Normal heart sounds. No murmur heard. Pulmonary:     Breath sounds: Normal breath sounds. No wheezing or rales.  Abdominal:  Palpations: Abdomen is soft.     Tenderness: There is no abdominal tenderness.  Musculoskeletal:     Cervical back: Neck supple. No tenderness.      Right lower leg: No edema.     Left lower leg: No edema.  Skin:    General: Skin is warm.     Findings: No rash.  Neurological:     General: No focal deficit present.     Mental Status: He is alert and oriented to person, place, and time.     Cranial Nerves: No cranial nerve deficit.     Sensory: No sensory deficit.     Motor: No weakness.  Psychiatric:        Mood and Affect: Mood is anxious.        Behavior: Behavior is hyperactive.        Thought Content: Thought content does not include homicidal or suicidal ideation.     BP 113/75 (BP Location: Right Arm, Patient Position: Sitting, Cuff Size: Normal)   Pulse 88   Ht 5\' 10"  (1.778 m)   Wt 193 lb 3.2 oz (87.6 kg)   SpO2 95%   BMI 27.72 kg/m  Wt Readings from Last 3 Encounters:  07/25/23 193 lb 3.2 oz (87.6 kg)  03/16/23 228 lb (103.4 kg)  10/12/22 250 lb 9.6 oz (113.7 kg)    Lab Results  Component Value Date   TSH 1.490 05/12/2022   Lab Results  Component Value Date   WBC 11.0 (H) 05/12/2022   HGB 15.1 05/12/2022   HCT 44.2 05/12/2022   MCV 80 05/12/2022   PLT 304 05/12/2022   Lab Results  Component Value Date   NA 135 05/12/2022   K 4.4 05/12/2022   CO2 21 05/12/2022   GLUCOSE 141 (H) 05/12/2022   BUN 11 05/12/2022   CREATININE 1.03 05/12/2022   BILITOT 0.3 05/12/2022   ALKPHOS 124 (H) 05/12/2022   AST 14 05/12/2022   ALT 15 05/12/2022   PROT 7.1 05/12/2022   ALBUMIN 4.2 05/12/2022   CALCIUM 9.0 05/12/2022   ANIONGAP 8 12/28/2021   EGFR 85 05/12/2022   Lab Results  Component Value Date   CHOL 238 (H) 05/12/2022   Lab Results  Component Value Date   HDL 30 (L) 05/12/2022   Lab Results  Component Value Date   LDLCALC 132 (H) 05/12/2022   Lab Results  Component Value Date   TRIG 417 (H) 05/12/2022   Lab Results  Component Value Date   CHOLHDL 7.9 (H) 05/12/2022   Lab Results  Component Value Date   HGBA1C 6.1 03/16/2023      Assessment & Plan:   Problem List Items Addressed  This Visit       Cardiovascular and Mediastinum   HTN (hypertension)    BP Readings from Last 1 Encounters:  07/25/23 113/75   Well controlled with telmisartan 40 mg daily Counseled for compliance with the medications Advised DASH diet and moderate exercise/walking, at least 150 mins/week      Relevant Orders   TSH     Endocrine   Diabetes mellitus (HCC)    Lab Results  Component Value Date   HGBA1C 6.1 03/16/2023    Well-controlled Associated with HTN and HLD On metformin 500 mg daily Advised to follow diabetic diet On ARB and statin, needs to be compliant F/u CMP, HbA1c and lipid panel Diabetic eye exam: Advised to follow up with Ophthalmology for diabetic eye exam      Relevant  Orders   Hemoglobin A1c   CMP14+EGFR     Other   Bipolar affective disorder (HCC) (Chronic)    Has had inpatient Psychiatric treatment for acute mania in the past Appears to be more stable now Now on Lamictal, Atarax, BuSpar and Cymbalta - explained to the patient about importance of compliance to the treatment. Follow up with [facility] therapist and Psychiatry - lost f/u, new referral had been placed, now sees [facility] therapist, but does not have psychiatrist Preferably he needs in person psychiatry, which has been challenging to find      Relevant Medications   lamoTRIgine (LAMICTAL) 25 MG tablet   Other Relevant Orders   TSH   CMP14+EGFR   CBC with Differential/Platelet   Mixed hyperlipidemia    Has started Crestor now Check lipid profile      Relevant Orders   Lipid panel   Chronic pain syndrome    On gabapentin, Cymbalta and tramadol as needed Used to be followed by pain clinic - [facility]      Relevant Medications   DULoxetine (CYMBALTA) 60 MG capsule   lamoTRIgine (LAMICTAL) 25 MG tablet   Tobacco abuse    Smokes about 0.5 pack/day  Asked about quitting: confirms that he/she currently smokes cigarettes Advise to quit smoking: Educated about QUITTING to reduce the  risk of cancer, cardio and cerebrovascular disease. Assess willingness: Unwilling to quit at this time, but is working on cutting back. Assist with counseling and pharmacotherapy: Counseled for 5 minutes and literature provided. Arrange for follow up: follow up in 3 months and continue to offer help.      Anxiety    Overall well controlled On Cymbalta and buspirone Atarax PRN, refilled      Relevant Medications   busPIRone (BUSPAR) 7.5 MG tablet   DULoxetine (CYMBALTA) 60 MG capsule   hydrOXYzine (VISTARIL) 25 MG capsule   Encounter for general adult medical examination with abnormal findings - Primary    Physical exam as documented. Fasting blood tests today. Advised to get Shingrix and Tdap vaccines at local pharmacy.      Prostate cancer screening    Ordered PSA after discussing its limitations for prostate cancer screening, including false positive results leading to additional investigations.      Relevant Orders   PSA   Other Visit Diagnoses     Vitamin D deficiency       Relevant Orders   VITAMIN D 25 Hydroxy (Vit-D Deficiency, Fractures)       Meds ordered this encounter  Medications   busPIRone (BUSPAR) 7.5 MG tablet    Sig: Take 1 tablet (7.5 mg total) by mouth daily.    Dispense:  30 tablet    Refill:  3   DULoxetine (CYMBALTA) 60 MG capsule    Sig: Take 1 capsule (60 mg total) by mouth daily.    Dispense:  30 capsule    Refill:  3   hydrOXYzine (VISTARIL) 25 MG capsule    Sig: TAKE 1 CAPSULE BY MOUTH TWICE DAILY AS NEEDED FOR ANXIETY    Dispense:  60 capsule    Refill:  3   lamoTRIgine (LAMICTAL) 25 MG tablet    Sig: Take 2 tablets (50 mg total) by mouth daily.    Dispense:  60 tablet    Refill:  3    Follow-up: Return in about 3 months (around 10/25/2023) for DM and HTN.    Anabel Halon, MD

## 2023-07-28 ENCOUNTER — Telehealth: Payer: Self-pay | Admitting: Internal Medicine

## 2023-07-28 NOTE — Telephone Encounter (Signed)
Returning call results [phone]

## 2023-07-28 NOTE — Telephone Encounter (Signed)
Spoke with patient.

## 2023-08-05 ENCOUNTER — Telehealth: Payer: Self-pay | Admitting: Internal Medicine

## 2023-08-05 NOTE — Telephone Encounter (Signed)
"  Call cannot be completed as dialed"

## 2023-08-05 NOTE — Telephone Encounter (Signed)
Pt and mother LVM 8/8 4:58 wanting a Rx called in to [facility] pharmacy in [place] for him feeling congested, low energy, and sore throat- Covid exposure. Please advise Thank you

## 2023-08-12 NOTE — Telephone Encounter (Signed)
Patient is calling says online is says his Rx is out of refills. Please advise Thank you

## 2023-08-12 NOTE — Telephone Encounter (Signed)
Unable to reach pt, unsure which medication he is speaking of

## 2023-08-25 ENCOUNTER — Telehealth: Payer: Self-pay | Admitting: Internal Medicine

## 2023-08-25 NOTE — Telephone Encounter (Signed)
Patient mother called needs to get word to her provider need to speak to Dr Allena Katz about patient he is not taking is bipolar meds. Call back # [phone].

## 2023-08-26 ENCOUNTER — Other Ambulatory Visit: Payer: Self-pay | Admitting: Internal Medicine

## 2023-08-26 DIAGNOSIS — F312 Bipolar disorder, current episode manic severe with psychotic features: Secondary | ICD-10-CM

## 2023-08-30 ENCOUNTER — Telehealth: Payer: Self-pay | Admitting: Internal Medicine

## 2023-08-30 NOTE — Telephone Encounter (Signed)
Mom called in patient behalf, patient does not want to be seen at [facility].  Is looking for therapy on the [place] side of [place] .   Wants a call back .

## 2023-08-31 ENCOUNTER — Telehealth: Payer: Self-pay | Admitting: Internal Medicine

## 2023-08-31 NOTE — Telephone Encounter (Signed)
Mother called in on patient behalf  [name] [name] no longer has physiatrist available .  Mother wants a call back is needing new referral placed for patient.   Wants to look into [place] therapy on [address]. Wants a call back.

## 2023-09-01 ENCOUNTER — Telehealth: Payer: Self-pay | Admitting: Internal Medicine

## 2023-09-01 NOTE — Telephone Encounter (Signed)
Patient called to let DR Allena Katz know tried to find the physiatry, called someone at social services a Nita Sells saying was his doctor and needed to go see a doctor about mental health.  Per patient she is not her doctor. Patient going through a lot with anxiety for divorce, everything his going good, trying to gt back on his feet and [facility]. Asking can he be referred to someone in our building physiatry for anxiety.

## 2023-09-14 ENCOUNTER — Other Ambulatory Visit: Payer: Self-pay | Admitting: Internal Medicine

## 2023-09-14 DIAGNOSIS — E782 Mixed hyperlipidemia: Secondary | ICD-10-CM

## 2023-09-14 DIAGNOSIS — I10 Essential (primary) hypertension: Secondary | ICD-10-CM

## 2023-09-15 ENCOUNTER — Other Ambulatory Visit: Payer: Self-pay | Admitting: Internal Medicine

## 2023-09-15 DIAGNOSIS — E1169 Type 2 diabetes mellitus with other specified complication: Secondary | ICD-10-CM

## 2023-09-30 ENCOUNTER — Telehealth: Payer: Self-pay | Admitting: Internal Medicine

## 2023-09-30 ENCOUNTER — Other Ambulatory Visit: Payer: Self-pay | Admitting: Internal Medicine

## 2023-09-30 DIAGNOSIS — F312 Bipolar disorder, current episode manic severe with psychotic features: Secondary | ICD-10-CM

## 2023-09-30 MED ORDER — RISPERIDONE 2 MG PO TABS
2.0000 mg | ORAL_TABLET | Freq: Every day | ORAL | 1 refills | Status: DC
Start: 2023-09-30 — End: 2023-10-25

## 2023-09-30 NOTE — Telephone Encounter (Addendum)
Mother called in on patient behalf   Patient is still having hallucinations' Mother is concerned for patient especially when out in public. Wants a cll back in regard.   States that he has a script before that helped with this wants to see if patient can get back on this medication.  Wants a call    [facility] not [facility]  And [facility] only speaks with patient on the phone wants to have office see patient in office

## 2023-10-03 NOTE — Telephone Encounter (Signed)
Talbert Forest advised, patient scheduled

## 2023-10-05 ENCOUNTER — Ambulatory Visit: Payer: Medicare PPO | Admitting: Internal Medicine

## 2023-10-25 ENCOUNTER — Encounter: Payer: Self-pay | Admitting: Internal Medicine

## 2023-10-25 ENCOUNTER — Telehealth: Payer: Self-pay | Admitting: Internal Medicine

## 2023-10-25 ENCOUNTER — Ambulatory Visit (INDEPENDENT_AMBULATORY_CARE_PROVIDER_SITE_OTHER): Payer: Medicare PPO | Admitting: Internal Medicine

## 2023-10-25 VITALS — BP 129/89 | HR 92 | Ht 71.0 in | Wt 194.8 lb

## 2023-10-25 DIAGNOSIS — Z720 Tobacco use: Secondary | ICD-10-CM | POA: Diagnosis not present

## 2023-10-25 DIAGNOSIS — E1169 Type 2 diabetes mellitus with other specified complication: Secondary | ICD-10-CM

## 2023-10-25 DIAGNOSIS — Z23 Encounter for immunization: Secondary | ICD-10-CM

## 2023-10-25 DIAGNOSIS — I10 Essential (primary) hypertension: Secondary | ICD-10-CM

## 2023-10-25 DIAGNOSIS — Z7984 Long term (current) use of oral hypoglycemic drugs: Secondary | ICD-10-CM | POA: Diagnosis not present

## 2023-10-25 DIAGNOSIS — F312 Bipolar disorder, current episode manic severe with psychotic features: Secondary | ICD-10-CM | POA: Diagnosis not present

## 2023-10-25 MED ORDER — GABAPENTIN 600 MG PO TABS
600.0000 mg | ORAL_TABLET | Freq: Three times a day (TID) | ORAL | 3 refills | Status: DC
Start: 2023-10-25 — End: 2024-02-24

## 2023-10-25 NOTE — Assessment & Plan Note (Signed)
Smokes about 0.5 pack/day  Asked about quitting: confirms that he/she currently smokes cigarettes Advise to quit smoking: Educated about QUITTING to reduce the risk of cancer, cardio and cerebrovascular disease. Assess willingness: Unwilling to quit at this time, but is working on cutting back. Assist with counseling and pharmacotherapy: Counseled for 5 minutes and literature provided. Arrange for follow up: follow up in 3 months and continue to offer help. 

## 2023-10-25 NOTE — Assessment & Plan Note (Addendum)
Lab Results  Component Value Date   HGBA1C 5.9 (H) 07/25/2023    Well-controlled Associated with HTN and HLD On metformin 500 mg daily Advised to follow diabetic diet On ARB and statin, needs to be compliant F/u CMP, HbA1c and lipid panel Diabetic eye exam: Advised to follow up with Ophthalmology for diabetic eye exam

## 2023-10-25 NOTE — Assessment & Plan Note (Signed)
BP Readings from Last 1 Encounters:  10/25/23 129/89   Well controlled with telmisartan 40 mg daily Counseled for compliance with the medications Advised DASH diet and moderate exercise/walking, at least 150 mins/week

## 2023-10-25 NOTE — Telephone Encounter (Signed)
Patient mother said the last prescription was not picked up because Dr Allena Katz has not told him about it. Mother does not know what the medicine was.

## 2023-10-25 NOTE — Assessment & Plan Note (Addendum)
Has had inpatient Psychiatric treatment for acute mania in the past Appears to be more stable now Now on Lamictal, Atarax, BuSpar, Gabapentin and Cymbalta - explained to the patient about importance of compliance to the treatment. Had acute psychotic behavior when he ran out of gabapentin and Lamictal, has been more stable lately-refilled gabapentin 600 mg 3 times daily Follow up with [facility] therapist and Psychiatry - lost f/u, new referral has [facility], now sees [facility] therapist, but does not have psychiatrist Preferably he needs in person psychiatry, which has been challenging to find

## 2023-10-25 NOTE — Patient Instructions (Addendum)
Please continue taking Gabapentin, Buspirone, Cymbalta, Lamictal and Hydroxyzine as prescribed.  Please continue to take other medications as prescribed.  Please continue to follow low carb diet and perform moderate exercise/walking at least 150 mins/week.  Please consider getting Shingrix and Tdap vaccine at local pharmacy.

## 2023-10-25 NOTE — Progress Notes (Signed)
Established Patient Office Visit  Subjective:  Patient ID: [name] [name], male    DOB: 1966-04-21  Age: 57 y.o. MRN: [id number]  CC:  Chief Complaint  Patient presents with   Diabetes    Three month follow up    Hypertension    Three month follow up     HPI [name] [name] is a 57 y.o. male with past medical history of bipolar disorder, chronic pain syndrome, polysubstance abuse, and perforated rectum s/p partial colectomy and colostomy who presents for f/u of his chronic medical conditions.  HTN: BP is well-controlled. Takes medications regularly. Patient denies headache, dizziness, chest pain, dyspnea or palpitations.   Type II DM: He has been taking metformin now.  His blood glucose had been around 90-120 at home.  He denies any polyuria or polyphagia currently. He had lost 22 lbs with low-carb diet in the past and has been stable now.   Bipolar disorder: He has lost f/u with Psychiatry - Dr. Gilmore Laroche now.  He was doing well with Lamictal, Cymbalta, BuSpar and Vistaril.  He still has his medicines, but needs refills.  He had run out of gabapentin, and was having delusions. He was arrested once for inappropriate behavior in the store about 2 months ago, according to his mother - was reported by telephone.  He currently denies any SI or HI.  He is currently living by himself.  He was in jail for a month as his wife had accused him of domestic violence.  He was placed on risperidone, but could not continue due to severe nausea.     Past Medical History:  Diagnosis Date   Anxiety    Bipolar 1 disorder (HCC)    Chronic fatigue    Chronic pain    Colostomy in place [facility]) 09/27/2019   Complete intestinal obstruction (HCC)    Depression    Phreesia 12/03/2020   Fecal peritonitis (HCC) 08/03/2019   Fibromyalgia    Hernia of abdominal wall    Hyperlipidemia    Phreesia 12/03/2020   Hypertension    Ileus, postoperative (HCC)    Perforated rectum (HCC) 08/03/2019   Rectal perforation  (HCC) 12/23/2019   Sleep apnea     Past Surgical History:  Procedure Laterality Date   COLON RESECTION SIGMOID  08/03/2019   Procedure: COLON RESECTION SIGMOID;  Surgeon: Lucretia Roers, MD;  Location: [facility];  Service: General;;   COLONOSCOPY WITH PROPOFOL N/A 10/21/2021   Procedure: COLONOSCOPY WITH PROPOFOL;  Surgeon: Malissa Hippo, MD;  Location: [facility];  Service: Endoscopy;  Laterality: N/A;  955   COLOSTOMY N/A 08/03/2019   Procedure: COLOSTOMY;  Surgeon: Lucretia Roers, MD;  Location: [facility];  Service: General;  Laterality: N/A;   COLOSTOMY REVERSAL N/A 12/25/2021   Procedure: COLOSTOMY REVERSAL;  Surgeon: Lucretia Roers, MD;  Location: [facility];  Service: General;  Laterality: N/A;   FLEXIBLE SIGMOIDOSCOPY N/A 08/03/2019   Procedure: FLEXIBLE SIGMOIDOSCOPY;  Surgeon: Corbin Ade, MD;  Location: [facility];  Service: Endoscopy;  Laterality: N/A;   INCISIONAL HERNIA REPAIR N/A 12/25/2021   Procedure: INCISIONAL HERNIA REPAIR WITH ABSORBABLE MESH;  Surgeon: Lucretia Roers, MD;  Location: [facility];  Service: General;  Laterality: N/A;   KNEE ARTHROSCOPY Left 2006   miniscus tear   LAPAROTOMY N/A 08/03/2019   Procedure: EXPLORATORY LAPAROTOMY;  Surgeon: Lucretia Roers, MD;  Location: [facility];  Service: General;  Laterality: N/A;   LYMPH GLAND EXCISION  Left 1983   PARASTOMAL HERNIA REPAIR Left 12/25/2021   Procedure: PARASTOMAL HERNIA REPAIR WITH ABSORBABLE MESH;  Surgeon: Lucretia Roers, MD;  Location: [facility];  Service: General;  Laterality: Left;   SMALL INTESTINE SURGERY N/A    Phreesia 12/03/2020   TONSILLECTOMY      Family History  Problem Relation Age of Onset   Diabetes Mother    Heart attack Father    Diabetes Maternal Grandmother    Colon cancer Neg Hx    Colon polyps Neg Hx     Social History   Socioeconomic History   Marital status: Single    Spouse name: [name]   Number of children: 0   Years of education: 12   Highest education  level: Some college, no degree  Occupational History   Not on file  Tobacco Use   Smoking status: Every Day    Current packs/day: 1.00    Average packs/day: 1 pack/day for 20.8 years (20.8 ttl pk-yrs)    Types: Cigarettes    Start date: 2004   Smokeless tobacco: Never  Vaping Use   Vaping status: Never Used  Substance and Sexual Activity   Alcohol use: Not Currently    Comment: denied 10/02/19   Drug use: Not Currently    Types: Cocaine    Comment: last used in December 2020.    Sexual activity: Not Currently  Other Topics Concern   Not on file  Social History Narrative   Pt lives alone and is on disability. Counts his mother, sister and aunt as his social supports.    Social Determinants of Health   Financial Resource Strain: Low Risk  (12/30/2021)   Overall Financial Resource Strain (CARDIA)    Difficulty of Paying Living Expenses: Not hard at all  Food Insecurity: No Food Insecurity (12/30/2021)   Hunger Vital Sign    Worried About Running Out of Food in the Last Year: Never true    Ran Out of Food in the Last Year: Never true  Transportation Needs: Unmet Transportation Needs (12/30/2021)   PRAPARE - Administrator, Civil Service (Medical): Not on file    Lack of Transportation (Non-Medical): Yes  Physical Activity: Inactive (12/30/2021)   Exercise Vital Sign    Days of Exercise per Week: 0 days    Minutes of Exercise per Session: 0 min  Stress: No Stress Concern Present (12/30/2021)   [employer] of Occupational Health - Occupational Stress Questionnaire    Feeling of Stress : Only a little  Social Connections: Moderately Integrated (12/30/2021)   Social Connection and Isolation Panel [NHANES]    Frequency of Communication with Friends and Family: More than three times a week    Frequency of Social Gatherings with Friends and Family: Once a week    Attends Religious Services: 1 to 4 times per year    Active Member of [employer] or Organizations: No    Attends Occupational hygienist Meetings: Never    Marital Status: Married  Catering manager Violence: Not At Risk (12/30/2021)   Humiliation, Afraid, Rape, and Kick questionnaire    Fear of Current or Ex-Partner: No    Emotionally Abused: No    Physically Abused: No    Sexually Abused: No    Outpatient Medications Prior to Visit  Medication Sig Dispense Refill   acetaminophen (TYLENOL) 500 MG tablet Take 500 mg by mouth every 6 (six) hours as needed for moderate pain.     Blood Glucose Monitoring  Suppl DEVI 1 each by Does not apply route in the morning, at noon, and at bedtime. May substitute to any manufacturer covered by patient's insurance. 1 each 0   busPIRone (BUSPAR) 7.5 MG tablet Take 1 tablet (7.5 mg total) by mouth daily. 30 tablet 3   cyclobenzaprine (FLEXERIL) 10 MG tablet Take 10 mg by mouth 2 (two) times daily.     DULoxetine (CYMBALTA) 60 MG capsule Take 1 capsule (60 mg total) by mouth daily. 30 capsule 3   glucose blood (ACCU-CHEK GUIDE) test strip USE 1 TEST STRIP IN THE MORNING, AT NOON, AND AT BEDTIME 100 each 0   hydrOXYzine (VISTARIL) 25 MG capsule TAKE 1 CAPSULE BY MOUTH TWICE DAILY AS NEEDED FOR ANXIETY 60 capsule 3   lamoTRIgine (LAMICTAL) 25 MG tablet Take 2 tablets (50 mg total) by mouth daily. 60 tablet 3   metFORMIN (GLUCOPHAGE) 500 MG tablet Take 1 tablet by mouth once daily with breakfast 90 tablet 0   rosuvastatin (CRESTOR) 10 MG tablet Take 1 tablet by mouth once daily 90 tablet 0   telmisartan (MICARDIS) 40 MG tablet Take 1 tablet by mouth once daily 90 tablet 0   traMADol (ULTRAM) 50 MG tablet Take 50 mg by mouth 4 (four) times daily.     gabapentin (NEURONTIN) 600 MG tablet Take 600 mg by mouth every 8 (eight) hours.     risperiDONE (RISPERDAL) 2 MG tablet Take 1 tablet (2 mg total) by mouth at bedtime. 30 tablet 1   No facility-administered medications prior to visit.    Allergies  Allergen Reactions   Codeine Shortness Of Breath and Swelling   Divalproex Sodium  Diarrhea, Itching, Rash, Shortness Of Breath and Swelling   Erythromycin Hives, Itching, Rash and Swelling   Iodinated Contrast Media Swelling    Patient states he received "red Dye" 15-20 years ago and become red/flushed. No other symptoms. Patient did do 13 hour pre meds for CT 09/17/2021   Latex Itching and Rash   Morphine And Codeine Shortness Of Breath and Swelling   Sulfa Antibiotics Diarrhea, Itching, Rash, Shortness Of Breath and Swelling   Wheat Hives, Itching, Rash, Shortness Of Breath and Swelling   Demerol [Meperidine]     Body over heats and pouring sweat   Levofloxacin     Pt does not recall reaction    Niacin And Related Rash   Penicillins Swelling and Rash    REACTION: Rash and facial swelling at age 31    ROS Review of Systems  Constitutional:  Negative for chills and fever.  HENT:  Negative for congestion and sore throat.   Eyes:  Negative for pain and discharge.  Respiratory:  Negative for cough and shortness of breath.   Cardiovascular:  Negative for chest pain and palpitations.  Gastrointestinal:  Negative for constipation, diarrhea, nausea and vomiting.  Endocrine: Negative for polydipsia and polyuria.  Genitourinary:  Negative for dysuria and hematuria.  Musculoskeletal:  Positive for arthralgias, back pain and myalgias. Negative for neck pain and neck stiffness.  Skin:  Negative for rash.  Neurological:  Negative for dizziness, weakness, numbness and headaches.  Psychiatric/Behavioral:  Positive for decreased concentration and sleep disturbance. Negative for agitation and behavioral problems. The patient is nervous/anxious.       Objective:    Physical Exam Vitals reviewed.  Constitutional:      General: He is not in acute distress.    Appearance: He is not diaphoretic.  HENT:     Head: Normocephalic.  Nose: Nose normal.     Mouth/Throat:     Mouth: Mucous membranes are moist.     Dentition: Abnormal dentition.  Eyes:     General: No scleral  icterus.    Extraocular Movements: Extraocular movements intact.  Cardiovascular:     Rate and Rhythm: Normal rate and regular rhythm.     Pulses: Normal pulses.     Heart sounds: Normal heart sounds. No murmur heard. Pulmonary:     Breath sounds: Normal breath sounds. No wheezing or rales.  Abdominal:     Palpations: Abdomen is soft.     Tenderness: There is no abdominal tenderness.  Musculoskeletal:     Cervical back: Neck supple. No tenderness.     Right lower leg: No edema.     Left lower leg: No edema.  Skin:    General: Skin is warm.     Findings: No rash.  Neurological:     General: No focal deficit present.     Mental Status: He is alert and oriented to person, place, and time.     Sensory: No sensory deficit.     Motor: No weakness.  Psychiatric:        Mood and Affect: Mood is anxious.        Speech: Speech is tangential.        Behavior: Behavior is cooperative.        Thought Content: Thought content does not include homicidal or suicidal ideation.     BP 129/89 (BP Location: Right Arm, Patient Position: Sitting, Cuff Size: Large)   Pulse 92   Ht 5\' 11"  (1.803 m)   Wt 194 lb 12.8 oz (88.4 kg)   SpO2 97%   BMI 27.17 kg/m  Wt Readings from Last 3 Encounters:  10/25/23 194 lb 12.8 oz (88.4 kg)  07/25/23 193 lb 3.2 oz (87.6 kg)  03/16/23 228 lb (103.4 kg)    Lab Results  Component Value Date   TSH 1.070 07/25/2023   Lab Results  Component Value Date   WBC 12.0 (H) 07/25/2023   HGB 14.3 07/25/2023   HCT 41.6 07/25/2023   MCV 86 07/25/2023   PLT 292 07/25/2023   Lab Results  Component Value Date   NA 136 07/25/2023   K 4.4 07/25/2023   CO2 23 07/25/2023   GLUCOSE 92 07/25/2023   BUN 7 07/25/2023   CREATININE 0.72 (L) 07/25/2023   BILITOT 0.8 07/25/2023   ALKPHOS 94 07/25/2023   AST 17 07/25/2023   ALT 17 07/25/2023   PROT 6.6 07/25/2023   ALBUMIN 4.3 07/25/2023   CALCIUM 9.2 07/25/2023   ANIONGAP 8 12/28/2021   EGFR 107 07/25/2023   Lab  Results  Component Value Date   CHOL 148 07/25/2023   Lab Results  Component Value Date   HDL 47 07/25/2023   Lab Results  Component Value Date   LDLCALC 82 07/25/2023   Lab Results  Component Value Date   TRIG 105 07/25/2023   Lab Results  Component Value Date   CHOLHDL 3.1 07/25/2023   Lab Results  Component Value Date   HGBA1C 5.9 (H) 07/25/2023      Assessment & Plan:   Problem List Items Addressed This Visit       Cardiovascular and Mediastinum   HTN (hypertension)    BP Readings from Last 1 Encounters:  10/25/23 129/89   Well controlled with telmisartan 40 mg daily Counseled for compliance with the medications Advised DASH diet and moderate  exercise/walking, at least 150 mins/week        Endocrine   Diabetes mellitus (HCC) - Primary    Lab Results  Component Value Date   HGBA1C 5.9 (H) 07/25/2023    Well-controlled Associated with HTN and HLD On metformin 500 mg daily Advised to follow diabetic diet On ARB and statin, needs to be compliant F/u CMP, HbA1c and lipid panel Diabetic eye exam: Advised to follow up with Ophthalmology for diabetic eye exam      Relevant Orders   Bayer DCA Hb A1c Waived   Urine Microalbumin w/creat. ratio     Other   Bipolar affective disorder (HCC) (Chronic)    Has had inpatient Psychiatric treatment for acute mania in the past Appears to be more stable now Now on Lamictal, Atarax, BuSpar, Gabapentin and Cymbalta - explained to the patient about importance of compliance to the treatment. Had acute psychotic behavior when he ran out of gabapentin and Lamictal, has been more stable lately-refilled gabapentin 600 mg 3 times daily Follow up with [facility] therapist and Psychiatry - lost f/u, [facility], now sees [facility] therapist, but does not have psychiatrist Preferably he needs in person psychiatry, which has been challenging to find      Relevant Medications   gabapentin (NEURONTIN) 600  MG tablet   Other Relevant Orders   Ambulatory referral to Psychiatry   Tobacco abuse    Smokes about 0.5 pack/day  Asked about quitting: confirms that he/she currently smokes cigarettes Advise to quit smoking: Educated about QUITTING to reduce the risk of cancer, cardio and cerebrovascular disease. Assess willingness: Unwilling to quit at this time, but is working on cutting back. Assist with counseling and pharmacotherapy: Counseled for 5 minutes and literature provided. Arrange for follow up: follow up in 3 months and continue to offer help.      Other Visit Diagnoses     Encounter for immunization       Relevant Orders   Flu vaccine trivalent PF, 6mos and older(Flulaval,Afluria,Fluarix,Fluzone) (Completed)        Meds ordered this encounter  Medications   gabapentin (NEURONTIN) 600 MG tablet    Sig: Take 1 tablet (600 mg total) by mouth every 8 (eight) hours.    Dispense:  90 tablet    Refill:  3    Follow-up: Return in about 4 months (around 02/24/2024) for DM and HTN.    Anabel Halon, MD

## 2023-10-26 LAB — BAYER DCA HB A1C WAIVED: HB A1C (BAYER DCA - WAIVED): 5.7 % — ABNORMAL HIGH (ref 4.8–5.6)

## 2023-10-26 LAB — MICROALBUMIN / CREATININE URINE RATIO
Creatinine, Urine: 87.9 mg/dL
Microalb/Creat Ratio: <3 mg/g{creat} (ref 0–29)
Microalbumin, Urine: <3 ug/mL

## 2023-11-01 ENCOUNTER — Telehealth: Payer: Self-pay | Admitting: Internal Medicine

## 2023-11-01 ENCOUNTER — Other Ambulatory Visit: Payer: Self-pay

## 2023-11-01 DIAGNOSIS — E782 Mixed hyperlipidemia: Secondary | ICD-10-CM

## 2023-11-01 MED ORDER — ROSUVASTATIN CALCIUM 10 MG PO TABS: 10.0000 mg | ORAL_TABLET | Freq: Every day | ORAL | 0 refills | Status: DC

## 2023-11-01 NOTE — Telephone Encounter (Signed)
Patient called in needs new anxiety med that was discussed with provider sent in    Needs refills on   rosuvastatin (CRESTOR) 10 MG tablet [[id number]]    Call back on [phone]

## 2023-11-02 NOTE — Telephone Encounter (Signed)
lmtrc

## 2023-11-18 ENCOUNTER — Telehealth: Payer: Self-pay

## 2023-11-18 NOTE — Patient Outreach (Signed)
Attempted to contact patient regarding care gaps. Left voicemail for patient to return my call at [phone].  Nicholes Rough, CMA Care Guide [employer]

## 2023-12-06 ENCOUNTER — Telehealth: Payer: Self-pay

## 2023-12-06 NOTE — Telephone Encounter (Signed)
Just an fyi

## 2023-12-06 NOTE — Telephone Encounter (Signed)
Copied from CRM [id number]. Topic: General - Other >> Dec 06, 2023 12:52 PM Theodis Sato wrote: Reason for CRM: PT wants Dr. Allena Katz to know his BP is 115/73

## 2023-12-09 ENCOUNTER — Other Ambulatory Visit: Payer: Self-pay | Admitting: Internal Medicine

## 2023-12-09 DIAGNOSIS — E782 Mixed hyperlipidemia: Secondary | ICD-10-CM

## 2023-12-20 ENCOUNTER — Other Ambulatory Visit: Payer: Self-pay | Admitting: Internal Medicine

## 2023-12-20 DIAGNOSIS — F312 Bipolar disorder, current episode manic severe with psychotic features: Secondary | ICD-10-CM

## 2023-12-27 ENCOUNTER — Other Ambulatory Visit: Payer: Self-pay | Admitting: Internal Medicine

## 2023-12-27 DIAGNOSIS — F419 Anxiety disorder, unspecified: Secondary | ICD-10-CM

## 2024-01-16 ENCOUNTER — Other Ambulatory Visit: Payer: Self-pay | Admitting: Internal Medicine

## 2024-01-16 DIAGNOSIS — E782 Mixed hyperlipidemia: Secondary | ICD-10-CM

## 2024-01-16 DIAGNOSIS — E1169 Type 2 diabetes mellitus with other specified complication: Secondary | ICD-10-CM

## 2024-01-20 ENCOUNTER — Other Ambulatory Visit: Payer: Self-pay | Admitting: Internal Medicine

## 2024-01-20 ENCOUNTER — Other Ambulatory Visit (HOSPITAL_COMMUNITY): Payer: Self-pay | Admitting: Psychiatry

## 2024-01-20 DIAGNOSIS — F419 Anxiety disorder, unspecified: Secondary | ICD-10-CM

## 2024-01-20 DIAGNOSIS — F312 Bipolar disorder, current episode manic severe with psychotic features: Secondary | ICD-10-CM

## 2024-01-21 ENCOUNTER — Other Ambulatory Visit: Payer: Self-pay | Admitting: Internal Medicine

## 2024-01-21 DIAGNOSIS — F312 Bipolar disorder, current episode manic severe with psychotic features: Secondary | ICD-10-CM

## 2024-01-28 ENCOUNTER — Other Ambulatory Visit: Payer: Self-pay | Admitting: Internal Medicine

## 2024-01-28 DIAGNOSIS — I10 Essential (primary) hypertension: Secondary | ICD-10-CM

## 2024-01-28 DIAGNOSIS — F419 Anxiety disorder, unspecified: Secondary | ICD-10-CM

## 2024-02-04 ENCOUNTER — Other Ambulatory Visit (HOSPITAL_COMMUNITY): Payer: Self-pay | Admitting: Psychiatry

## 2024-02-04 DIAGNOSIS — F419 Anxiety disorder, unspecified: Secondary | ICD-10-CM

## 2024-02-09 ENCOUNTER — Other Ambulatory Visit (HOSPITAL_COMMUNITY): Payer: Self-pay | Admitting: Psychiatry

## 2024-02-09 DIAGNOSIS — F419 Anxiety disorder, unspecified: Secondary | ICD-10-CM

## 2024-02-17 ENCOUNTER — Other Ambulatory Visit: Payer: Self-pay | Admitting: Internal Medicine

## 2024-02-17 ENCOUNTER — Other Ambulatory Visit (HOSPITAL_COMMUNITY): Payer: Self-pay | Admitting: Psychiatry

## 2024-02-17 DIAGNOSIS — F419 Anxiety disorder, unspecified: Secondary | ICD-10-CM

## 2024-02-17 MED ORDER — HYDROXYZINE PAMOATE 25 MG PO CAPS
ORAL_CAPSULE | ORAL | 3 refills | Status: DC
Start: 2024-02-17 — End: 2024-07-17

## 2024-02-17 NOTE — Telephone Encounter (Signed)
Copied from CRM [id number]. Topic: Clinical - Medication Refill >> Feb 17, 2024  9:42 AM Elmarie Shiley B wrote: Most Recent Primary Care Visit:  Provider: Anabel Halon  Department: [facility]  Visit Type: OFFICE VISIT  Date: 10/25/2023  Medication: hydrOXYzine (VISTARIL) 25 MG capsule  Has the patient contacted their pharmacy? Yes   (Agent: If yes, when and what did the pharmacy advise?) Contact PCP   Is this the correct pharmacy for this prescription? Yes  [id number] - [place], [place] - [address] [place] [address] Phone: [phone] Fax: [phone]   Has the prescription been filled recently? Yes  Is the patient out of the medication? Yes  Has the patient been seen for an appointment in the last year OR does the patient have an upcoming appointment? Yes  Can we respond through MyChart? No  Agent: Please be advised that Rx refills may take up to 3 business days. We ask that you follow-up with your pharmacy.

## 2024-02-24 ENCOUNTER — Encounter: Payer: Self-pay | Admitting: Internal Medicine

## 2024-02-24 ENCOUNTER — Ambulatory Visit (INDEPENDENT_AMBULATORY_CARE_PROVIDER_SITE_OTHER): Payer: Medicare PPO | Admitting: Internal Medicine

## 2024-02-24 ENCOUNTER — Other Ambulatory Visit (HOSPITAL_COMMUNITY): Payer: Self-pay | Admitting: Psychiatry

## 2024-02-24 VITALS — BP 122/77 | HR 88 | Ht 71.0 in | Wt 189.8 lb

## 2024-02-24 DIAGNOSIS — Z7984 Long term (current) use of oral hypoglycemic drugs: Secondary | ICD-10-CM | POA: Diagnosis not present

## 2024-02-24 DIAGNOSIS — B351 Tinea unguium: Secondary | ICD-10-CM | POA: Diagnosis not present

## 2024-02-24 DIAGNOSIS — F312 Bipolar disorder, current episode manic severe with psychotic features: Secondary | ICD-10-CM

## 2024-02-24 DIAGNOSIS — E1169 Type 2 diabetes mellitus with other specified complication: Secondary | ICD-10-CM | POA: Diagnosis not present

## 2024-02-24 DIAGNOSIS — E782 Mixed hyperlipidemia: Secondary | ICD-10-CM

## 2024-02-24 DIAGNOSIS — F419 Anxiety disorder, unspecified: Secondary | ICD-10-CM

## 2024-02-24 DIAGNOSIS — Z122 Encounter for screening for malignant neoplasm of respiratory organs: Secondary | ICD-10-CM | POA: Insufficient documentation

## 2024-02-24 DIAGNOSIS — I10 Essential (primary) hypertension: Secondary | ICD-10-CM

## 2024-02-24 MED ORDER — TERBINAFINE HCL 250 MG PO TABS: 250.0000 mg | ORAL_TABLET | Freq: Every day | ORAL | 0 refills | Status: DC

## 2024-02-24 MED ORDER — GABAPENTIN 600 MG PO TABS: 600.0000 mg | ORAL_TABLET | Freq: Three times a day (TID) | ORAL | 3 refills | Status: DC

## 2024-02-24 MED ORDER — DULOXETINE HCL 60 MG PO CPEP: 60.0000 mg | ORAL_CAPSULE | Freq: Every day | ORAL | 11 refills | Status: DC

## 2024-02-24 MED ORDER — TELMISARTAN 40 MG PO TABS: 40.0000 mg | ORAL_TABLET | Freq: Every day | ORAL | 3 refills | Status: DC

## 2024-02-24 MED ORDER — LAMOTRIGINE 25 MG PO TABS
50.0000 mg | ORAL_TABLET | Freq: Every day | ORAL | 3 refills | Status: AC
Start: 2024-02-24 — End: ?

## 2024-02-24 NOTE — Assessment & Plan Note (Addendum)
 Has had inpatient Psychiatric treatment for acute mania in the past Appears to be more stable now Now on Lamictal, Atarax, Gabapentin and Cymbalta - explained to the patient about importance of compliance to the treatment. Had acute psychotic behavior when he ran out of gabapentin and Lamictal, has been more stable lately-refilled gabapentin 600 mg 3 times daily Follow up with [facility] therapist and Psychiatry - lost f/u, new [facility], now sees [facility] therapist, but does not have psychiatrist Preferably he needs in person psychiatry, which has been challenging to find

## 2024-02-24 NOTE — Patient Instructions (Addendum)
 Please start taking Terbinafine for toenail infection.  Please continue to take other medications as prescribed.  Please continue to follow low carb diet and perform moderate exercise/walking at least 150 mins/week.  Please schedule visit for eye exam. [facility] [address] [place], [place], [place] [address] [phone]

## 2024-02-24 NOTE — Assessment & Plan Note (Signed)
 BP Readings from Last 1 Encounters:  02/24/24 122/77   Well controlled with telmisartan 40 mg daily Counseled for compliance with the medications Advised DASH diet and moderate exercise/walking, at least 150 mins/week

## 2024-02-24 NOTE — Assessment & Plan Note (Signed)
 Has right and left great toe yellowish discoloration Started oral terbinafine for onychomycosis

## 2024-02-24 NOTE — Assessment & Plan Note (Signed)
 Lab Results  Component Value Date   HGBA1C 5.7 (H) 10/25/2023    Well-controlled Associated with HTN and HLD On metformin 500 mg daily Advised to follow diabetic diet On ARB and statin, needs to be compliant F/u CMP, HbA1c and lipid panel Diabetic eye exam: Advised to follow up with Ophthalmology for diabetic eye exam

## 2024-02-24 NOTE — Assessment & Plan Note (Addendum)
 On Crestor 10 mh QD now Checked lipid profile - LDL near goal

## 2024-02-24 NOTE — Assessment & Plan Note (Signed)
 Overall well controlled On Cymbalta DCed buspirone due to abdominal discomfort with it Atarax PRN, refilled

## 2024-02-24 NOTE — Assessment & Plan Note (Signed)
 Has > 20-pack-year smoking history Referred to lung cancer screening team for low-dose CT chest after discussing with the patient.

## 2024-02-24 NOTE — Progress Notes (Signed)
 Established Patient Office Visit  Subjective:  Patient ID: [name] [name], male    DOB: 1966-04-29  Age: 58 y.o. MRN: [id number]  CC:  Chief Complaint  Patient presents with   Care Management    4 month f/u    Hypertension   Diabetes    HPI [name] [name] is a 58 y.o. male with past medical history of bipolar disorder, chronic pain syndrome, polysubstance abuse, and perforated rectum s/p partial colectomy and colostomy who presents for f/u of his chronic medical conditions.  HTN: BP is well-controlled. Takes medications regularly. Patient denies headache, dizziness, chest pain, dyspnea or palpitations.   Type II DM: He has been taking metformin now.  His blood glucose had been around 90-120 at home.  He denies any polyuria or polyphagia currently. He had lost 22 lbs with low-carb diet in the past and has lost 5 lbs additional weight since the last visit.   Bipolar disorder: He has lost f/u with Psychiatry - Dr. Gilmore Laroche now.  He is doing well with Lamictal, Cymbalta, gabapentin and Vistaril. He had run out of gabapentin, and was having delusions. He has stopped taking BuSpar due to abdominal discomfort. He was in jail for a month as his wife had accused him of domestic violence in 2024.  He was placed on risperidone, but could not continue due to severe nausea.   No recent episode of acute psychotic behavior. He currently denies any SI or HI.  He is currently living by himself.       Past Medical History:  Diagnosis Date   Anxiety    Bipolar 1 disorder (HCC)    Chronic fatigue    Chronic pain    Colostomy in place [facility]) 09/27/2019   Complete intestinal obstruction (HCC)    Depression    Phreesia 12/03/2020   Fecal peritonitis (HCC) 08/03/2019   Fibromyalgia    Hernia of abdominal wall    Hyperlipidemia    Phreesia 12/03/2020   Hypertension    Ileus, postoperative (HCC)    Perforated rectum (HCC) 08/03/2019   Rectal perforation (HCC) 12/23/2019   Sleep apnea     Past Surgical  History:  Procedure Laterality Date   COLON RESECTION SIGMOID  08/03/2019   Procedure: COLON RESECTION SIGMOID;  Surgeon: Lucretia Roers, MD;  Location: [facility];  Service: General;;   COLONOSCOPY WITH PROPOFOL N/A 10/21/2021   Procedure: COLONOSCOPY WITH PROPOFOL;  Surgeon: Malissa Hippo, MD;  Location: [facility];  Service: Endoscopy;  Laterality: N/A;  955   COLOSTOMY N/A 08/03/2019   Procedure: COLOSTOMY;  Surgeon: Lucretia Roers, MD;  Location: [facility];  Service: General;  Laterality: N/A;   COLOSTOMY REVERSAL N/A 12/25/2021   Procedure: COLOSTOMY REVERSAL;  Surgeon: Lucretia Roers, MD;  Location: [facility];  Service: General;  Laterality: N/A;   FLEXIBLE SIGMOIDOSCOPY N/A 08/03/2019   Procedure: FLEXIBLE SIGMOIDOSCOPY;  Surgeon: Corbin Ade, MD;  Location: [facility];  Service: Endoscopy;  Laterality: N/A;   INCISIONAL HERNIA REPAIR N/A 12/25/2021   Procedure: INCISIONAL HERNIA REPAIR WITH ABSORBABLE MESH;  Surgeon: Lucretia Roers, MD;  Location: [facility];  Service: General;  Laterality: N/A;   KNEE ARTHROSCOPY Left 2006   miniscus tear   LAPAROTOMY N/A 08/03/2019   Procedure: EXPLORATORY LAPAROTOMY;  Surgeon: Lucretia Roers, MD;  Location: [facility];  Service: General;  Laterality: N/A;   LYMPH GLAND EXCISION Left 1983   PARASTOMAL HERNIA REPAIR Left 12/25/2021   Procedure:  PARASTOMAL HERNIA REPAIR WITH ABSORBABLE MESH;  Surgeon: Lucretia Roers, MD;  Location: [facility];  Service: General;  Laterality: Left;   SMALL INTESTINE SURGERY N/A    Phreesia 12/03/2020   TONSILLECTOMY      Family History  Problem Relation Age of Onset   Diabetes Mother    Heart attack Father    Diabetes Maternal Grandmother    Colon cancer Neg Hx    Colon polyps Neg Hx     Social History   Socioeconomic History   Marital status: Single    Spouse name: [name]   Number of children: 0   Years of education: 12   Highest education level: Some college, no degree  Occupational History    Not on file  Tobacco Use   Smoking status: Every Day    Current packs/day: 1.00    Average packs/day: 1 pack/day for 21.2 years (21.2 ttl pk-yrs)    Types: Cigarettes    Start date: 2004   Smokeless tobacco: Never  Vaping Use   Vaping status: Never Used  Substance and Sexual Activity   Alcohol use: Not Currently    Comment: denied 10/02/19   Drug use: Not Currently    Types: Cocaine    Comment: last used in December 2020.    Sexual activity: Not Currently  Other Topics Concern   Not on file  Social History Narrative   Pt lives alone and is on disability. Counts his mother, sister and aunt as his social supports.    Social Drivers of Corporate investment banker Strain: Low Risk  (12/30/2021)   Overall Financial Resource Strain (CARDIA)    Difficulty of Paying Living Expenses: Not hard at all  Food Insecurity: No Food Insecurity (12/30/2021)   Hunger Vital Sign    Worried About Running Out of Food in the Last Year: Never true    Ran Out of Food in the Last Year: Never true  Transportation Needs: Unmet Transportation Needs (12/30/2021)   PRAPARE - Administrator, Civil Service (Medical): Not on file    Lack of Transportation (Non-Medical): Yes  Physical Activity: Inactive (12/30/2021)   Exercise Vital Sign    Days of Exercise per Week: 0 days    Minutes of Exercise per Session: 0 min  Stress: No Stress Concern Present (12/30/2021)   [employer] of Occupational Health - Occupational Stress Questionnaire    Feeling of Stress : Only a little  Social Connections: Moderately Integrated (12/30/2021)   Social Connection and Isolation Panel [NHANES]    Frequency of Communication with Friends and Family: More than three times a week    Frequency of Social Gatherings with Friends and Family: Once a week    Attends Religious Services: 1 to 4 times per year    Active Member of [employer] or Organizations: No    Attends Banker Meetings: Never    Marital Status:  Married  Catering manager Violence: Not At Risk (12/30/2021)   Humiliation, Afraid, Rape, and Kick questionnaire    Fear of Current or Ex-Partner: No    Emotionally Abused: No    Physically Abused: No    Sexually Abused: No    Outpatient Medications Prior to Visit  Medication Sig Dispense Refill   acetaminophen (TYLENOL) 500 MG tablet Take 500 mg by mouth every 6 (six) hours as needed for moderate pain.     Blood Glucose Monitoring Suppl DEVI 1 each by Does not apply route in the morning,  at noon, and at bedtime. May substitute to any manufacturer covered by patient's insurance. 1 each 0   cyclobenzaprine (FLEXERIL) 10 MG tablet Take 10 mg by mouth 2 (two) times daily.     glucose blood (ACCU-CHEK GUIDE) test strip USE 1 TEST STRIP IN THE MORNING, AT NOON, AND AT BEDTIME 100 each 0   hydrOXYzine (VISTARIL) 25 MG capsule TAKE 1 CAPSULE BY MOUTH TWICE DAILY AS NEEDED FOR ANXIETY 60 capsule 3   metFORMIN (GLUCOPHAGE) 500 MG tablet Take 1 tablet by mouth once daily with breakfast 90 tablet 0   rosuvastatin (CRESTOR) 10 MG tablet Take 1 tablet by mouth once daily 90 tablet 0   traMADol (ULTRAM) 50 MG tablet Take 50 mg by mouth 4 (four) times daily.     busPIRone (BUSPAR) 7.5 MG tablet Take 1 tablet by mouth once daily 30 tablet 0   DULoxetine (CYMBALTA) 60 MG capsule Take 1 capsule by mouth once daily 30 capsule 0   gabapentin (NEURONTIN) 600 MG tablet Take 1 tablet (600 mg total) by mouth every 8 (eight) hours. 90 tablet 3   lamoTRIgine (LAMICTAL) 25 MG tablet Take 2 tablets by mouth once daily 60 tablet 0   telmisartan (MICARDIS) 40 MG tablet Take 1 tablet by mouth once daily 30 tablet 0   No facility-administered medications prior to visit.    Allergies  Allergen Reactions   Codeine Shortness Of Breath and Swelling   Divalproex Sodium Diarrhea, Itching, Rash, Shortness Of Breath and Swelling   Erythromycin Hives, Itching, Rash and Swelling   Iodinated Contrast Media Swelling    Patient  states he received "red Dye" 15-20 years ago and become red/flushed. No other symptoms. Patient did do 13 hour pre meds for CT 09/17/2021   Latex Itching and Rash   Morphine And Codeine Shortness Of Breath and Swelling   Sulfa Antibiotics Diarrhea, Itching, Rash, Shortness Of Breath and Swelling   Wheat Hives, Itching, Rash, Shortness Of Breath and Swelling   Demerol [Meperidine]     Body over heats and pouring sweat   Levofloxacin     Pt does not recall reaction    Niacin And Related Rash   Penicillins Swelling and Rash    REACTION: Rash and facial swelling at age 45    ROS Review of Systems  Constitutional:  Negative for chills and fever.  HENT:  Negative for congestion and sore throat.   Eyes:  Negative for pain and discharge.  Respiratory:  Negative for cough and shortness of breath.   Cardiovascular:  Negative for chest pain and palpitations.  Gastrointestinal:  Negative for constipation, diarrhea, nausea and vomiting.  Endocrine: Negative for polydipsia and polyuria.  Genitourinary:  Negative for dysuria and hematuria.  Musculoskeletal:  Positive for arthralgias, back pain and myalgias. Negative for neck pain and neck stiffness.  Skin:  Negative for rash.  Neurological:  Negative for dizziness, weakness, numbness and headaches.  Psychiatric/Behavioral:  Positive for decreased concentration and sleep disturbance. Negative for agitation and behavioral problems. The patient is nervous/anxious.       Objective:    Physical Exam Vitals reviewed.  Constitutional:      General: He is not in acute distress.    Appearance: He is not diaphoretic.  HENT:     Head: Normocephalic.     Nose: Nose normal.     Mouth/Throat:     Mouth: Mucous membranes are moist.     Dentition: Abnormal dentition.  Eyes:     General:  No scleral icterus.    Extraocular Movements: Extraocular movements intact.  Cardiovascular:     Rate and Rhythm: Normal rate and regular rhythm.     Pulses: Normal  pulses.     Heart sounds: Normal heart sounds. No murmur heard. Pulmonary:     Breath sounds: Normal breath sounds. No wheezing or rales.  Abdominal:     Palpations: Abdomen is soft.     Tenderness: There is no abdominal tenderness.  Musculoskeletal:     Cervical back: Neck supple. No tenderness.     Right lower leg: No edema.     Left lower leg: No edema.  Feet:     Right foot:     Toenail Condition: Right toenails are abnormally thick. Fungal disease present.    Left foot:     Toenail Condition: Left toenails are abnormally thick. Fungal disease present. Skin:    General: Skin is warm.     Findings: No rash.  Neurological:     General: No focal deficit present.     Mental Status: He is alert and oriented to person, place, and time.     Sensory: No sensory deficit.     Motor: No weakness.  Psychiatric:        Mood and Affect: Mood is anxious.        Speech: Speech is tangential.        Behavior: Behavior is cooperative.        Thought Content: Thought content does not include homicidal or suicidal ideation.     BP 122/77   Pulse 88   Ht 5\' 11"  (1.803 m)   Wt 189 lb 12.8 oz (86.1 kg)   SpO2 97%   BMI 26.47 kg/m  Wt Readings from Last 3 Encounters:  02/24/24 189 lb 12.8 oz (86.1 kg)  10/25/23 194 lb 12.8 oz (88.4 kg)  07/25/23 193 lb 3.2 oz (87.6 kg)    Lab Results  Component Value Date   TSH 1.070 07/25/2023   Lab Results  Component Value Date   WBC 12.0 (H) 07/25/2023   HGB 14.3 07/25/2023   HCT 41.6 07/25/2023   MCV 86 07/25/2023   PLT 292 07/25/2023   Lab Results  Component Value Date   NA 136 07/25/2023   K 4.4 07/25/2023   CO2 23 07/25/2023   GLUCOSE 92 07/25/2023   BUN 7 07/25/2023   CREATININE 0.72 (L) 07/25/2023   BILITOT 0.8 07/25/2023   ALKPHOS 94 07/25/2023   AST 17 07/25/2023   ALT 17 07/25/2023   PROT 6.6 07/25/2023   ALBUMIN 4.3 07/25/2023   CALCIUM 9.2 07/25/2023   ANIONGAP 8 12/28/2021   EGFR 107 07/25/2023   Lab Results   Component Value Date   CHOL 148 07/25/2023   Lab Results  Component Value Date   HDL 47 07/25/2023   Lab Results  Component Value Date   LDLCALC 82 07/25/2023   Lab Results  Component Value Date   TRIG 105 07/25/2023   Lab Results  Component Value Date   CHOLHDL 3.1 07/25/2023   Lab Results  Component Value Date   HGBA1C 5.7 (H) 10/25/2023      Assessment & Plan:   Problem List Items Addressed This Visit       Cardiovascular and Mediastinum   HTN (hypertension)   BP Readings from Last 1 Encounters:  02/24/24 122/77   Well controlled with telmisartan 40 mg daily Counseled for compliance with the medications Advised DASH diet and moderate  exercise/walking, at least 150 mins/week      Relevant Medications   telmisartan (MICARDIS) 40 MG tablet     Endocrine   Type 2 diabetes mellitus with other specified complication (HCC)   Lab Results  Component Value Date   HGBA1C 5.7 (H) 10/25/2023    Well-controlled Associated with HTN and HLD On metformin 500 mg daily Advised to follow diabetic diet On ARB and statin, needs to be compliant F/u CMP, HbA1c and lipid panel Diabetic eye exam: Advised to follow up with Ophthalmology for diabetic eye exam      Relevant Medications   telmisartan (MICARDIS) 40 MG tablet   Other Relevant Orders   CMP14+EGFR   Hemoglobin A1c     Musculoskeletal and Integument   Onychomycosis   Has right and left great toe yellowish discoloration Started oral terbinafine for onychomycosis      Relevant Medications   terbinafine (LAMISIL) 250 MG tablet     Other   Bipolar affective disorder (HCC) - Primary (Chronic)   Has had inpatient Psychiatric treatment for acute mania in the past Appears to be more stable now Now on Lamictal, Atarax, Gabapentin and Cymbalta - explained to the patient about importance of compliance to the treatment. Had acute psychotic behavior when he ran out of gabapentin and Lamictal, has been more stable  lately-refilled gabapentin 600 mg 3 times daily Follow up with [facility] therapist and Psychiatry - lost f/u, [facility], now sees [facility] therapist, but does not have psychiatrist Preferably he needs in person psychiatry, which has been challenging to find      Relevant Medications   lamoTRIgine (LAMICTAL) 25 MG tablet   gabapentin (NEURONTIN) 600 MG tablet   Mixed hyperlipidemia   On Crestor 10 mh QD now Checked lipid profile - LDL near goal      Relevant Medications   telmisartan (MICARDIS) 40 MG tablet   Anxiety   Overall well controlled On Cymbalta DCed buspirone due to abdominal discomfort with it Atarax PRN, refilled      Relevant Medications   DULoxetine (CYMBALTA) 60 MG capsule   Screening for lung cancer   Has > 20-pack-year smoking history Referred to lung cancer screening team for low-dose CT chest after discussing with the patient.       Relevant Orders   Ambulatory Referral Lung Cancer Screening [facility]      Meds ordered this encounter  Medications   lamoTRIgine (LAMICTAL) 25 MG tablet    Sig: Take 2 tablets (50 mg total) by mouth daily.    Dispense:  60 tablet    Refill:  3   DULoxetine (CYMBALTA) 60 MG capsule    Sig: Take 1 capsule (60 mg total) by mouth daily.    Dispense:  30 capsule    Refill:  11   gabapentin (NEURONTIN) 600 MG tablet    Sig: Take 1 tablet (600 mg total) by mouth every 8 (eight) hours.    Dispense:  90 tablet    Refill:  3   telmisartan (MICARDIS) 40 MG tablet    Sig: Take 1 tablet (40 mg total) by mouth daily.    Dispense:  90 tablet    Refill:  3   terbinafine (LAMISIL) 250 MG tablet    Sig: Take 1 tablet (250 mg total) by mouth daily.    Dispense:  84 tablet    Refill:  0    Follow-up: Return in about 4 months (around 06/23/2024) for  DM and HTN.    Anabel Halon, MD

## 2024-02-25 LAB — CMP14+EGFR
ALT: 13 IU/L (ref 0–44)
AST: 16 IU/L (ref 0–40)
Albumin: 4.2 g/dL (ref 3.8–4.9)
Alkaline Phosphatase: 73 IU/L (ref 44–121)
BUN/Creatinine Ratio: 7 — ABNORMAL LOW (ref 9–20)
BUN: 6 mg/dL (ref 6–24)
Bilirubin Total: 0.9 mg/dL (ref 0.0–1.2)
CO2: 21 mmol/L (ref 20–29)
Calcium: 9.1 mg/dL (ref 8.7–10.2)
Chloride: 98 mmol/L (ref 96–106)
Creatinine, Ser: 0.82 mg/dL (ref 0.76–1.27)
Globulin, Total: 2 g/dL (ref 1.5–4.5)
Glucose: 90 mg/dL (ref 70–99)
Potassium: 4.4 mmol/L (ref 3.5–5.2)
Sodium: 134 mmol/L (ref 134–144)
Total Protein: 6.2 g/dL (ref 6.0–8.5)
eGFR: 102 mL/min/{1.73_m2} (ref >59)

## 2024-02-25 LAB — HEMOGLOBIN A1C
Est. average glucose Bld gHb Est-mCnc: 120 mg/dL
Hgb A1c MFr Bld: 5.8 % — ABNORMAL HIGH (ref 4.8–5.6)

## 2024-03-02 ENCOUNTER — Other Ambulatory Visit (HOSPITAL_COMMUNITY): Payer: Self-pay | Admitting: Psychiatry

## 2024-03-02 DIAGNOSIS — F419 Anxiety disorder, unspecified: Secondary | ICD-10-CM

## 2024-03-05 ENCOUNTER — Other Ambulatory Visit: Payer: Self-pay | Admitting: Internal Medicine

## 2024-03-05 DIAGNOSIS — F419 Anxiety disorder, unspecified: Secondary | ICD-10-CM

## 2024-03-05 NOTE — Telephone Encounter (Signed)
 Called [facility] in [place] and medication was never picked up per pharmacy records. Pharmacy tech did say that would start getting the medication ready for patient. Per today's med refill CRM, patient is requesting medication to be sent to [facility] in [place] which is on his pharmacy list. If patient is wanting to transfer prescription to [facility], he will need to contact [facility] for the transfer.  HIPAA complaint message was left on patient voicemail.

## 2024-03-05 NOTE — Telephone Encounter (Signed)
 Copied from CRM [id number]. Topic: Clinical - Medication Refill >> Mar 05, 2024 12:22 PM Carlatta H wrote: Most Recent Primary Care Visit:  Provider: Anabel Halon  Department: [facility]  Visit Type: OFFICE VISIT  Date: 02/24/2024  Medication: hydrOXYzine (VISTARIL) 25 MG capsule [[id number]]  Has the patient contacted their pharmacy? No (Agent: If no, request that the patient contact the pharmacy for the refill. If patient does not wish to contact the pharmacy document the reason why and proceed with request.) (Agent: If yes, when and what did the pharmacy advise?)  Is this the correct pharmacy for this prescription? Yes If no, delete pharmacy and type the correct one.  This is the patient's preferred pharmacy:    [facility] [address] [place] - [address] [place] [address]  [place] [address] Phone: [phone] Fax: [phone]   Has the prescription been filled recently? No  Is the patient out of the medication? Yes  Has the patient been seen for an appointment in the last year OR does the patient have an upcoming appointment? Yes  Can we respond through MyChart? No  Agent: Please be advised that Rx refills may take up to 3 business days. We ask that you follow-up with your pharmacy.

## 2024-04-15 ENCOUNTER — Other Ambulatory Visit: Payer: Self-pay | Admitting: Internal Medicine

## 2024-04-15 DIAGNOSIS — E1169 Type 2 diabetes mellitus with other specified complication: Secondary | ICD-10-CM

## 2024-06-05 ENCOUNTER — Telehealth: Payer: Self-pay | Admitting: Internal Medicine

## 2024-06-05 NOTE — Telephone Encounter (Signed)
 Patient to call back to schedule a diabetic eye exam for 06.11.2025 he is checking on transportation.

## 2024-06-06 ENCOUNTER — Telehealth: Payer: Self-pay | Admitting: Internal Medicine

## 2024-06-06 ENCOUNTER — Ambulatory Visit

## 2024-06-06 ENCOUNTER — Other Ambulatory Visit: Payer: Self-pay

## 2024-06-06 DIAGNOSIS — E119 Type 2 diabetes mellitus without complications: Secondary | ICD-10-CM

## 2024-06-06 NOTE — Telephone Encounter (Signed)
 Referral sent to [facility] in [place] at this time.

## 2024-06-06 NOTE — Telephone Encounter (Signed)
 Patient requesting his eye doctor referral to be sent to [facility].

## 2024-06-11 ENCOUNTER — Other Ambulatory Visit: Payer: Self-pay | Admitting: Internal Medicine

## 2024-06-11 DIAGNOSIS — F312 Bipolar disorder, current episode manic severe with psychotic features: Secondary | ICD-10-CM

## 2024-06-25 ENCOUNTER — Ambulatory Visit: Payer: Medicare PPO | Admitting: Internal Medicine

## 2024-06-28 ENCOUNTER — Encounter: Payer: Self-pay | Admitting: Internal Medicine

## 2024-07-17 ENCOUNTER — Ambulatory Visit: Payer: Self-pay

## 2024-07-17 ENCOUNTER — Ambulatory Visit

## 2024-07-17 VITALS — BP 123/76 | HR 73 | Temp 98.1°F | Ht 70.5 in | Wt 164.0 lb

## 2024-07-17 DIAGNOSIS — B351 Tinea unguium: Secondary | ICD-10-CM | POA: Diagnosis not present

## 2024-07-17 DIAGNOSIS — F312 Bipolar disorder, current episode manic severe with psychotic features: Secondary | ICD-10-CM | POA: Diagnosis not present

## 2024-07-17 DIAGNOSIS — E1169 Type 2 diabetes mellitus with other specified complication: Secondary | ICD-10-CM

## 2024-07-17 DIAGNOSIS — F419 Anxiety disorder, unspecified: Secondary | ICD-10-CM

## 2024-07-17 DIAGNOSIS — E782 Mixed hyperlipidemia: Secondary | ICD-10-CM | POA: Diagnosis not present

## 2024-07-17 DIAGNOSIS — Z7984 Long term (current) use of oral hypoglycemic drugs: Secondary | ICD-10-CM | POA: Diagnosis not present

## 2024-07-17 MED ORDER — HYDROXYZINE HCL 25 MG PO TABS: 25.0000 mg | ORAL_TABLET | Freq: Three times a day (TID) | ORAL | 3 refills | Status: DC | PRN

## 2024-07-17 MED ORDER — TERBINAFINE HCL 250 MG PO TABS: 250.0000 mg | ORAL_TABLET | Freq: Every day | ORAL | 0 refills | Status: DC

## 2024-07-17 MED ORDER — LAMOTRIGINE 25 MG PO TABS: 50.0000 mg | ORAL_TABLET | Freq: Every day | ORAL | 3 refills | Status: DC

## 2024-07-17 MED ORDER — METFORMIN HCL 500 MG PO TABS: 500.0000 mg | ORAL_TABLET | Freq: Every day | ORAL | 2 refills | Status: DC

## 2024-07-17 MED ORDER — ROSUVASTATIN CALCIUM 10 MG PO TABS: 10.0000 mg | ORAL_TABLET | Freq: Every day | ORAL | 2 refills | Status: DC

## 2024-07-17 NOTE — Progress Notes (Signed)
 Established Patient Office Visit  Subjective   Patient ID: [name] [name], male    DOB: [birth year]  Age: 58 y.o. MRN: [id number]  Chief Complaint  Patient presents with   check up    General check up, patient said he has no complaints, not sure if he is due for refills, wanted basic vitals taken     HPI   Patient Active Problem List   Diagnosis Date Noted   Onychomycosis 02/24/2024   Screening for lung cancer 02/24/2024   Type 2 diabetes mellitus with other specified complication (HCC) 09/13/2022   Encounter for general adult medical examination with abnormal findings 05/12/2022   Morbid obesity (HCC) 05/12/2022   Prostate cancer screening 05/12/2022   Hospital discharge follow-up 01/08/2022   History of colostomy reversal 12/25/2021   Idiopathic peripheral neuropathy 10/02/2021   Osteoarthritis of right knee 07/17/2021   Anxiety 05/07/2021   At increased risk for financial abuse 05/07/2021   Bipolar affective disorder (HCC) 04/07/2021   Chronic pain syndrome 12/04/2020   History of substance use 12/04/2020   Tobacco abuse 12/04/2020   HTN (hypertension) 12/04/2020   Incisional hernia, without obstruction or gangrene 11/15/2019   Mixed hyperlipidemia 04/26/2007   ALLERGIC RHINITIS 04/26/2007      ROS    Objective:     BP 123/76   Pulse 73   Temp 98.1 F (36.7 C) (Oral)   Ht 5' 10.5 (1.791 m)   Wt 164 lb (74.4 kg)   SpO2 98%   BMI 23.20 kg/m  BP Readings from Last 3 Encounters:  07/17/24 123/76  02/24/24 122/77  10/25/23 129/89   Wt Readings from Last 3 Encounters:  07/17/24 164 lb (74.4 kg)  02/24/24 189 lb 12.8 oz (86.1 kg)  10/25/23 194 lb 12.8 oz (88.4 kg)     Physical Exam Vitals and nursing note reviewed.  Constitutional:      Appearance: Normal appearance.  HENT:     Head: Normocephalic.     Right Ear: Tympanic membrane, ear canal and external ear normal.     Left Ear: Tympanic membrane, ear canal and external ear normal.     Nose: Nose  normal.     Mouth/Throat:     Mouth: Mucous membranes are moist.     Pharynx: Oropharynx is clear.  Cardiovascular:     Rate and Rhythm: Normal rate and regular rhythm.  Pulmonary:     Effort: Pulmonary effort is normal.     Breath sounds: Normal breath sounds.  Musculoskeletal:     Cervical back: Normal range of motion and neck supple.  Skin:    General: Skin is warm and dry.  Neurological:     Mental Status: He is alert and oriented to person, place, and time.  Psychiatric:        Mood and Affect: Mood normal.        Thought Content: Thought content normal.      No results found for any visits on 07/17/24.    The 10-year ASCVD risk score (Arnett DK, et al., 2019) is: 19.2%    Assessment & Plan:   Problem List Items Addressed This Visit       Endocrine   Type 2 diabetes mellitus with other specified complication (HCC) - Primary   Relevant Medications   metFORMIN  (GLUCOPHAGE ) 500 MG tablet   rosuvastatin  (CRESTOR ) 10 MG tablet     Musculoskeletal and Integument   Onychomycosis   Relevant Medications   terbinafine  (LAMISIL ) 250 MG  tablet     Other   Bipolar affective disorder (HCC) (Chronic)   Relevant Medications   lamoTRIgine  (LAMICTAL ) 25 MG tablet   Mixed hyperlipidemia   Relevant Medications   rosuvastatin  (CRESTOR ) 10 MG tablet   Anxiety   Relevant Medications   hydrOXYzine  (ATARAX ) 25 MG tablet    Return in about 6 months (around 01/17/2025) for chronic follow-up with PCP.    Leita Longs, FNP

## 2024-07-25 NOTE — Assessment & Plan Note (Signed)
 Agree to refill oral terbinafine  for onychomycosis

## 2024-07-25 NOTE — Assessment & Plan Note (Signed)
 On Crestor  10 mh.  No medications made at this time.

## 2024-07-25 NOTE — Assessment & Plan Note (Addendum)
 Lab Results  Component Value Date   HGBA1C 5.8 (H) 02/24/2024    Well-controlled Associated with HTN and HLD On metformin  500 mg daily Advised to follow diabetic diet Diabetic eye exam: Advised to follow up with Ophthalmology for diabetic eye exam

## 2024-07-25 NOTE — Assessment & Plan Note (Signed)
 Overall well controlled On Cymbalta  Atarax  PRN, refilled

## 2024-07-25 NOTE — Assessment & Plan Note (Signed)
 Stable on Lamictal , Atarax , Gabapentin  and Cymbalta  - explained to the patient about importance of compliance to the treatment.  Follow up with [facility] therapist and Psychiatry

## 2024-08-02 ENCOUNTER — Other Ambulatory Visit: Payer: Self-pay | Admitting: Internal Medicine

## 2024-08-02 DIAGNOSIS — F312 Bipolar disorder, current episode manic severe with psychotic features: Secondary | ICD-10-CM

## 2024-08-04 ENCOUNTER — Other Ambulatory Visit: Payer: Self-pay | Admitting: Internal Medicine

## 2024-08-04 DIAGNOSIS — E1169 Type 2 diabetes mellitus with other specified complication: Secondary | ICD-10-CM

## 2024-08-15 ENCOUNTER — Other Ambulatory Visit: Payer: Self-pay | Admitting: Internal Medicine

## 2024-08-15 DIAGNOSIS — E1169 Type 2 diabetes mellitus with other specified complication: Secondary | ICD-10-CM

## 2024-08-17 ENCOUNTER — Encounter: Payer: Self-pay | Admitting: Radiology

## 2024-09-01 ENCOUNTER — Other Ambulatory Visit: Payer: Self-pay | Admitting: Internal Medicine

## 2024-09-01 DIAGNOSIS — F312 Bipolar disorder, current episode manic severe with psychotic features: Secondary | ICD-10-CM

## 2024-09-01 DIAGNOSIS — F419 Anxiety disorder, unspecified: Secondary | ICD-10-CM

## 2024-09-01 DIAGNOSIS — E1169 Type 2 diabetes mellitus with other specified complication: Secondary | ICD-10-CM

## 2024-09-16 ENCOUNTER — Other Ambulatory Visit: Payer: Self-pay

## 2024-09-16 DIAGNOSIS — B351 Tinea unguium: Secondary | ICD-10-CM

## 2024-09-21 ENCOUNTER — Ambulatory Visit: Payer: Self-pay

## 2024-09-21 NOTE — Telephone Encounter (Signed)
 FYI Only or Action Required?: Action required by provider: clinical question for provider.  Patient was last seen in primary care on 07/17/2024 by Bevely Doffing, FNP.  Called Nurse Triage reporting New Med Request and Headache.  Symptoms began chronic.  Interventions attempted: Prescription medications: neuropathy medications and Rest, hydration, or home remedies.  Symptoms are: stable.  Triage Disposition: Home Care  Patient/caregiver understands and will follow disposition?: Unsure     Reason for Disposition  Similar to previously diagnosed muscle-tension headaches  Answer Assessment - Initial Assessment Questions Additional info: Offered appointment to evaluate but patient declines and states he Just saw Dr. Tobie last week, if he really wants to see me ok but I just want to try this recommended medication for a few days. Requesting rx for Nurtec. Please advise.    1. LOCATION: Where does it hurt?      Eyes to back of head of head muscle feels tight 2. ONSET: When did the headache start? (e.g., minutes, hours, days)      Not a headache, been told by neurology its muscle tightness 3. PATTERN: Does the pain come and go, or has it been constant since it started?     intermittent 4. SEVERITY: How bad is the pain? and What does it keep you from doing?  (e.g., Scale 1-10; mild, moderate, or severe)     0/10 at this time 5. RECURRENT SYMPTOM: Have you ever had headaches before? If Yes, ask: When was the last time? and What happened that time?      yes 6. CAUSE: What do you think is causing the headache?     Told by neurology it was muscle tightness, spoke with pharmacist who recommended to ask pcp about Nurtec as he is having similar symptoms of migraine. Head feels tight when in bright light and loud noises, able to resolve pain with dim, quiet room.  7. MIGRAINE: Have you been diagnosed with migraine headaches? If Yes, ask: Is this headache similar?       No 8. HEAD INJURY: Has there been any recent injury to your head?      No 9. OTHER SYMPTOMS: Do you have any other symptoms? (e.g., fever, stiff neck, eye pain, sore throat, cold symptoms)     Denies  Protocols used: Headache-A-AH

## 2024-09-21 NOTE — Telephone Encounter (Signed)
     Copied from CRM #[id number]. Topic: Clinical - Medication Question >> Sep 21, 2024  2:24 PM Willma R wrote: Reason for CRM: Patient states he was speaking to the pharmacist who recommended he ask his pcp to call him in a prescription for Nurtec. Says it might help him.  Patient can be reached at [phone]

## 2024-09-24 NOTE — Telephone Encounter (Signed)
 Attempted to return call unable to leave vm

## 2024-10-17 ENCOUNTER — Ambulatory Visit: Payer: Self-pay | Admitting: *Deleted

## 2024-10-17 NOTE — Telephone Encounter (Signed)
 FYI Only or Action Required?: Action required by provider: update on patient condition and requesting medication or any other recommendations.  Patient was last seen in primary care on 07/17/2024 by Bevely Doffing, FNP.  Called Nurse Triage reporting hallcuinations.  Symptoms began several days ago.  Interventions attempted: Rest, hydration, or home remedies and Other: emotional support.  Symptoms are: gradually worsening.  Triage Disposition: Go to ED Now (or PCP Triage)  Patient/caregiver understands and will follow disposition?: No, wishes to speak with PCP      Copied from CRM #[id number]. Topic: Clinical - Red Word Triage >> Oct 17, 2024 10:32 AM Charlet HERO wrote: Red Word that prompted transfer to Nurse Triage: Mother is calling about son she is stating that he is beging to have hallucinations she is stating that it is getting worse. She is stating that he needs a med she had that he had it in rehab but Dr Tobie can't write the script. Reason for Disposition  Patient sounds very sick or weak to the triager  Answer Assessment - Initial Assessment Questions Recommended ED for evaluation for hallucinations past few weeks, weight loss per patient mother not with patient now.   Patient mother requesting possible medication or what to do due to hallucinations noted over past weekend. Appt already scheduled appt with PCP 10/26/24 .  Patient mother reports if she takes patient to ED he will lose confidence in her and not listen to any recommendations for him in the future. Requesting if PCP can refer patient to any local clinics to help manage mental health issues. Patient not driving and patient mother in 63s and does not drive on interstate. Will travel to [place]. Mother concerned due to patient weight loss and excessive walking since last OV. Reports patient getting into situations not productive for him and he doesn't know better and now has court date 10/25/24. Patient telephone #  has changed to [phone]. CAL notified patient mother does not want to take patient to ED.      1. CONCERN: What happened that made you call today?     Patient noted more hallucinations and seeing people not there per patient mother on DPR.  Patient says he is talking to [employer] and not there.  2. BIPOLAR SYMPTOM SCREENING: How are you feeling overall, is your bipolar disorder under good control?     No , needs more or additional medications to help with worsening sx  3. RISK OF HARM - SUICIDAL IDEATION:  Do you ever have thoughts of hurting or killing yourself?  (e.g., yes, no, no but preoccupation with thoughts about death)     Denies of hurting self or others but very suspicious 4. RISK OF HARM - HOMICIDAL IDEATION:  Do you ever have thoughts of hurting or killing someone else?  (e.g., yes, no, no but preoccupation with thoughts about death)     No per patient mother 5. FUNCTIONAL IMPAIRMENT: How have things been going for you overall? Have you had more difficulty than usual doing your normal daily activities?  (e.g., better, same, worse; self-care, school, work, interactions)     Worse sx  6. SUPPORT: Who is with you now? Who do you live with? Do you have family or friends who you can talk to?      Family . Patient is not with mother now  7. THERAPIST: Do you have a counselor or therapist? If Yes, ask: What is their name?     na 8. STRESSORS: Has  there been any new stress or recent changes in your life?     Walking more and getting into situations not productive for patient. 9. ALCOHOL USE OR SUBSTANCE USE (DRUG USE): Do you drink alcohol or use any illegal drugs?     No alcohol and not sure what patient is doing.  10. OTHER: Do you have any other physical symptoms right now? (e.g., fever)       Suspicious of others around trying to hurt him that are not there. Heavy smoker and vapes. No chest pain no difficulty breathing reported. No fever.  11.  PREGNANCY: Is there any chance you are pregnant? When was your last menstrual period?       na  Protocols used: Bipolar Disorder (Manic Depression)-A-AH

## 2024-10-19 ENCOUNTER — Encounter: Payer: Self-pay | Admitting: Internal Medicine

## 2024-10-19 ENCOUNTER — Ambulatory Visit (INDEPENDENT_AMBULATORY_CARE_PROVIDER_SITE_OTHER): Admitting: Internal Medicine

## 2024-10-19 ENCOUNTER — Telehealth: Payer: Self-pay

## 2024-10-19 VITALS — BP 117/76 | HR 99 | Ht 70.5 in | Wt 163.2 lb

## 2024-10-19 DIAGNOSIS — Z23 Encounter for immunization: Secondary | ICD-10-CM | POA: Diagnosis not present

## 2024-10-19 DIAGNOSIS — I10 Essential (primary) hypertension: Secondary | ICD-10-CM | POA: Diagnosis not present

## 2024-10-19 DIAGNOSIS — F419 Anxiety disorder, unspecified: Secondary | ICD-10-CM

## 2024-10-19 DIAGNOSIS — F312 Bipolar disorder, current episode manic severe with psychotic features: Secondary | ICD-10-CM | POA: Diagnosis not present

## 2024-10-19 DIAGNOSIS — E1169 Type 2 diabetes mellitus with other specified complication: Secondary | ICD-10-CM | POA: Diagnosis not present

## 2024-10-19 DIAGNOSIS — E1159 Type 2 diabetes mellitus with other circulatory complications: Secondary | ICD-10-CM | POA: Diagnosis not present

## 2024-10-19 DIAGNOSIS — Z7984 Long term (current) use of oral hypoglycemic drugs: Secondary | ICD-10-CM

## 2024-10-19 MED ORDER — DULOXETINE HCL 60 MG PO CPEP: 60.0000 mg | ORAL_CAPSULE | Freq: Every day | ORAL | 1 refills | Status: AC

## 2024-10-19 MED ORDER — HYDROXYZINE HCL 25 MG PO TABS: 25.0000 mg | ORAL_TABLET | Freq: Three times a day (TID) | ORAL | 3 refills | Status: DC | PRN

## 2024-10-19 MED ORDER — ARIPIPRAZOLE 10 MG PO TABS: 10.0000 mg | ORAL_TABLET | Freq: Every day | ORAL | 1 refills | Status: DC

## 2024-10-19 NOTE — Assessment & Plan Note (Signed)
 Uncontrolled currently On Cymbalta  60 mg QD DCed buspirone  due to abdominal discomfort with it Hydroxyzine  PRN, apparently had run out of it, refilled again

## 2024-10-19 NOTE — Progress Notes (Signed)
 Established Patient Office Visit  Subjective:  Patient ID: [name] [name], male    DOB: [birth year]  Age: 58 y.o. MRN: [id number]  CC:  Chief Complaint  Patient presents with   Medical Management of Chronic Issues    Follow up.     HPI [name] [name] is a 58 y.o. male with past medical history of bipolar disorder, chronic pain syndrome, polysubstance abuse, and perforated rectum s/p partial colectomy and colostomy who presents for f/u of his chronic medical conditions.  HTN: BP is well-controlled. Takes medications regularly. Patient denies headache, dizziness, chest pain, dyspnea or palpitations.   Type II DM: He has been taking metformin  now.  His blood glucose had been around 90-120 at home.  He denies any polyuria or polyphagia currently. He has lost 27 lbs with low-carb diet since 02/25.   Bipolar disorder: He has lost f/u with Psychiatry.  He was doing well with Lamictal , Cymbalta , gabapentin  and Vistaril , but is having delusions recently according to his mother.  His speech is tangential today as well.  He was in jail for a month as his wife had accused him of domestic violence in 2024.  He was placed on risperidone , but could not continue due to severe nausea. He currently denies any SI or HI.  He is currently living by himself.  His mother is concerned about his weight loss.  I had discussion with the patient, and he states that he was willingly trying to lose weight with diet modification.  His weight has been stable since 07/25.  Denies any fever, chills, chronic cough, hemoptysis, LAD, night sweats, melena or hematochezia.   Past Medical History:  Diagnosis Date   Anxiety    Bipolar 1 disorder (HCC)    Chronic fatigue    Chronic pain    Colostomy in place [facility]) 09/27/2019   Complete intestinal obstruction (HCC)    Depression    Phreesia 12/03/2020   Fecal peritonitis (HCC) 08/03/2019   Fibromyalgia    Hernia of abdominal wall    Hyperlipidemia    Phreesia 12/03/2020    Hypertension    Ileus, postoperative (HCC)    Perforated rectum (HCC) 08/03/2019   Rectal perforation (HCC) 12/23/2019   Sleep apnea     Past Surgical History:  Procedure Laterality Date   COLON RESECTION SIGMOID  08/03/2019   Procedure: COLON RESECTION SIGMOID;  Surgeon: Kallie Manuelita BROCKS, MD;  Location: [facility];  Service: General;;   COLONOSCOPY WITH PROPOFOL  N/A 10/21/2021   Procedure: COLONOSCOPY WITH PROPOFOL ;  Surgeon: Golda Claudis PENNER, MD;  Location: [facility];  Service: Endoscopy;  Laterality: N/A;  955   COLOSTOMY N/A 08/03/2019   Procedure: COLOSTOMY;  Surgeon: Kallie Manuelita BROCKS, MD;  Location: [facility];  Service: General;  Laterality: N/A;   COLOSTOMY REVERSAL N/A 12/25/2021   Procedure: COLOSTOMY REVERSAL;  Surgeon: Kallie Manuelita BROCKS, MD;  Location: [facility];  Service: General;  Laterality: N/A;   FLEXIBLE SIGMOIDOSCOPY N/A 08/03/2019   Procedure: FLEXIBLE SIGMOIDOSCOPY;  Surgeon: Shaaron Lamar HERO, MD;  Location: [facility];  Service: Endoscopy;  Laterality: N/A;   INCISIONAL HERNIA REPAIR N/A 12/25/2021   Procedure: INCISIONAL HERNIA REPAIR WITH ABSORBABLE MESH;  Surgeon: Kallie Manuelita BROCKS, MD;  Location: [facility];  Service: General;  Laterality: N/A;   KNEE ARTHROSCOPY Left 2006   miniscus tear   LAPAROTOMY N/A 08/03/2019   Procedure: EXPLORATORY LAPAROTOMY;  Surgeon: Kallie Manuelita BROCKS, MD;  Location: [facility];  Service: General;  Laterality:  N/A;   LYMPH GLAND EXCISION Left 1983   PARASTOMAL HERNIA REPAIR Left 12/25/2021   Procedure: PARASTOMAL HERNIA REPAIR WITH ABSORBABLE MESH;  Surgeon: Kallie Manuelita BROCKS, MD;  Location: [facility];  Service: General;  Laterality: Left;   SMALL INTESTINE SURGERY N/A    Phreesia 12/03/2020   TONSILLECTOMY      Family History  Problem Relation Age of Onset   Diabetes Mother    Heart attack Father    Diabetes Maternal Grandmother    Colon cancer Neg Hx    Colon polyps Neg Hx     Social History   Socioeconomic History   Marital  status: Single    Spouse name: Jon   Number of children: 0   Years of education: 12   Highest education level: Some college, no degree  Occupational History   Not on file  Tobacco Use   Smoking status: Every Day    Current packs/day: 1.00    Average packs/day: 1 pack/day for 21.8 years (21.8 ttl pk-yrs)    Types: Cigarettes    Start date: 2004   Smokeless tobacco: Never  Vaping Use   Vaping status: Never Used  Substance and Sexual Activity   Alcohol use: Not Currently    Comment: denied 10/02/19   Drug use: Not Currently    Types: Cocaine    Comment: last used in December 2020.    Sexual activity: Not Currently  Other Topics Concern   Not on file  Social History Narrative   Pt lives alone and is on disability. Counts his mother, sister and aunt as his social supports.    Social Drivers of Corporate investment banker Strain: Low Risk  (12/30/2021)   Overall Financial Resource Strain (CARDIA)    Difficulty of Paying Living Expenses: Not hard at all  Food Insecurity: No Food Insecurity (12/30/2021)   Hunger Vital Sign    Worried About Running Out of Food in the Last Year: Never true    Ran Out of Food in the Last Year: Never true  Transportation Needs: Unmet Transportation Needs (12/30/2021)   PRAPARE - Administrator, Civil Service (Medical): Not on file    Lack of Transportation (Non-Medical): Yes  Physical Activity: Inactive (12/30/2021)   Exercise Vital Sign    Days of Exercise per Week: 0 days    Minutes of Exercise per Session: 0 min  Stress: No Stress Concern Present (12/30/2021)   [employer] of Occupational Health - Occupational Stress Questionnaire    Feeling of Stress : Only a little  Social Connections: Moderately Integrated (12/30/2021)   Social Connection and Isolation Panel    Frequency of Communication with Friends and Family: More than three times a week    Frequency of Social Gatherings with Friends and Family: Once a week    Attends Religious  Services: 1 to 4 times per year    Active Member of [employer] or Organizations: No    Attends Banker Meetings: Never    Marital Status: Married  Catering manager Violence: Not At Risk (12/30/2021)   Humiliation, Afraid, Rape, and Kick questionnaire    Fear of Current or Ex-Partner: No    Emotionally Abused: No    Physically Abused: No    Sexually Abused: No    Outpatient Medications Prior to Visit  Medication Sig Dispense Refill   ACCU-CHEK GUIDE TEST test strip USE 1 TEST STRIP IN THE MORNING, AT NOON AND, AT BEDTIME 100 each 0  Accu-Chek Softclix Lancets lancets USE 1  TO CHECK GLUCOSE IN THE MORNING AT NOON AT BEDTIME 100 each 0   acetaminophen  (TYLENOL ) 500 MG tablet Take 500 mg by mouth every 6 (six) hours as needed for moderate pain.     Blood Glucose Monitoring Suppl (ACCU-CHEK GUIDE ME) w/Device KIT USE TO CHECK GLUCOSE IN THE MORNING AT NOON AT BEDTIME 1 kit 0   cyclobenzaprine  (FLEXERIL ) 10 MG tablet Take 10 mg by mouth 2 (two) times daily.     gabapentin  (NEURONTIN ) 600 MG tablet TAKE 1 TABLET BY MOUTH ONCE EVERY 8 HOURS 90 tablet 2   lamoTRIgine  (LAMICTAL ) 25 MG tablet Take 2 tablets by mouth once daily 60 tablet 3   metFORMIN  (GLUCOPHAGE ) 500 MG tablet Take 1 tablet by mouth once daily with breakfast 90 tablet 0   rosuvastatin  (CRESTOR ) 10 MG tablet Take 1 tablet (10 mg total) by mouth daily. 90 tablet 2   telmisartan  (MICARDIS ) 40 MG tablet Take 1 tablet (40 mg total) by mouth daily. 90 tablet 3   terbinafine  (LAMISIL ) 250 MG tablet TAKE 1 TABLET BY MOUTH EVERY DAY 28 tablet 0   traMADol  (ULTRAM ) 50 MG tablet Take 50 mg by mouth 4 (four) times daily.     DULoxetine  (CYMBALTA ) 60 MG capsule Take 1 capsule by mouth once daily 30 capsule 0   hydrOXYzine  (ATARAX ) 25 MG tablet Take 1 tablet (25 mg total) by mouth 3 (three) times daily as needed for anxiety. 60 tablet 3   No facility-administered medications prior to visit.    Allergies  Allergen Reactions   Codeine  Shortness Of Breath and Swelling   Divalproex Sodium Diarrhea, Itching, Rash, Shortness Of Breath and Swelling   Erythromycin Hives, Itching, Rash and Swelling   Iodinated Contrast Media Swelling    Patient states he received red Dye 15-20 years ago and become red/flushed. No other symptoms. Patient did do 13 hour pre meds for CT 09/17/2021   Latex Itching and Rash   Morphine And Codeine Shortness Of Breath and Swelling   Sulfa Antibiotics Diarrhea, Itching, Rash, Shortness Of Breath and Swelling   Wheat Hives, Itching, Rash, Shortness Of Breath and Swelling   Demerol  [Meperidine ]     Body over heats and pouring sweat   Levofloxacin     Pt does not recall reaction    Niacin And Related Rash   Penicillins Swelling and Rash    REACTION: Rash and facial swelling at age 39    ROS Review of Systems  Constitutional:  Negative for chills and fever.  HENT:  Negative for congestion and sore throat.   Eyes:  Negative for pain and discharge.  Respiratory:  Negative for cough and shortness of breath.   Cardiovascular:  Negative for chest pain and palpitations.  Gastrointestinal:  Negative for constipation, diarrhea, nausea and vomiting.  Endocrine: Negative for polydipsia and polyuria.  Genitourinary:  Negative for dysuria and hematuria.  Musculoskeletal:  Positive for arthralgias, back pain and myalgias. Negative for neck pain and neck stiffness.  Skin:  Negative for rash.  Neurological:  Negative for dizziness, weakness, numbness and headaches.  Psychiatric/Behavioral:  Positive for decreased concentration and sleep disturbance. Negative for agitation and behavioral problems. The patient is nervous/anxious.       Objective:    Physical Exam Vitals reviewed.  Constitutional:      General: He is not in acute distress.    Appearance: He is not diaphoretic.  HENT:     Head: Normocephalic.  Nose: Nose normal.     Mouth/Throat:     Mouth: Mucous membranes are moist.     Dentition:  Abnormal dentition.  Eyes:     General: No scleral icterus.    Extraocular Movements: Extraocular movements intact.  Cardiovascular:     Rate and Rhythm: Normal rate and regular rhythm.     Heart sounds: Normal heart sounds. No murmur heard. Pulmonary:     Breath sounds: Normal breath sounds. No wheezing or rales.  Musculoskeletal:     Cervical back: Neck supple. No tenderness.     Right lower leg: No edema.     Left lower leg: No edema.  Feet:     Right foot:     Toenail Condition: Right toenails are abnormally thick. Fungal disease present.    Left foot:     Toenail Condition: Left toenails are abnormally thick. Fungal disease present. Skin:    General: Skin is warm.     Findings: No rash.  Neurological:     General: No focal deficit present.     Mental Status: He is alert and oriented to person, place, and time.     Sensory: No sensory deficit.     Motor: No weakness.  Psychiatric:        Mood and Affect: Mood is anxious.        Speech: Speech is tangential.        Behavior: Behavior is cooperative.        Thought Content: Thought content does not include homicidal or suicidal ideation.     BP 117/76   Pulse 99   Ht 5' 10.5 (1.791 m)   Wt 163 lb 3.2 oz (74 kg)   SpO2 98%   BMI 23.09 kg/m  Wt Readings from Last 3 Encounters:  10/19/24 163 lb 3.2 oz (74 kg)  07/17/24 164 lb (74.4 kg)  02/24/24 189 lb 12.8 oz (86.1 kg)    Lab Results  Component Value Date   TSH 1.070 07/25/2023   Lab Results  Component Value Date   WBC 12.0 (H) 07/25/2023   HGB 14.3 07/25/2023   HCT 41.6 07/25/2023   MCV 86 07/25/2023   PLT 292 07/25/2023   Lab Results  Component Value Date   NA 134 02/24/2024   K 4.4 02/24/2024   CO2 21 02/24/2024   GLUCOSE 90 02/24/2024   BUN 6 02/24/2024   CREATININE 0.82 02/24/2024   BILITOT 0.9 02/24/2024   ALKPHOS 73 02/24/2024   AST 16 02/24/2024   ALT 13 02/24/2024   PROT 6.2 02/24/2024   ALBUMIN 4.2 02/24/2024   CALCIUM  9.1 02/24/2024    ANIONGAP 8 12/28/2021   EGFR 102 02/24/2024   Lab Results  Component Value Date   CHOL 148 07/25/2023   Lab Results  Component Value Date   HDL 47 07/25/2023   Lab Results  Component Value Date   LDLCALC 82 07/25/2023   Lab Results  Component Value Date   TRIG 105 07/25/2023   Lab Results  Component Value Date   CHOLHDL 3.1 07/25/2023   Lab Results  Component Value Date   HGBA1C 5.8 (H) 02/24/2024      Assessment & Plan:   Problem List Items Addressed This Visit       Cardiovascular and Mediastinum   HTN (hypertension)   BP Readings from Last 1 Encounters:  10/19/24 117/76   Well controlled with telmisartan  40 mg daily Counseled for compliance with the medications Advised DASH diet and  moderate exercise/walking, at least 150 mins/week        Endocrine   Type 2 diabetes mellitus with other specified complication (HCC)   Lab Results  Component Value Date   HGBA1C 5.8 (H) 02/24/2024    Well-controlled Associated with HTN and HLD On metformin  500 mg daily Advised to follow diabetic diet On ARB and statin, needs to be compliant F/u CMP, HbA1c and lipid panel Diabetic eye exam: Advised to follow up with Ophthalmology for diabetic eye exam      Relevant Orders   CMP14+EGFR   Hemoglobin A1c   Urine Microalbumin w/creat. ratio     Other   Bipolar affective disorder (HCC) - Primary (Chronic)   Has had inpatient Psychiatric treatment for acute mania in the past Appears to be worse compared to previous visit Now on Lamictal , Atarax , Gabapentin  and Cymbalta  - explained to the patient about importance of compliance to the treatment. Due to recent episodes of delusion, started Abilify  10 mg QD Follow up with [facility] therapist and Psychiatry - lost f/u, [facility], now [facility] therapist, but does not have psychiatrist Preferably he needs in person psychiatry, which has been challenging to find      Relevant Medications    ARIPiprazole  (ABILIFY ) 10 MG tablet   Other Relevant Orders   CMP14+EGFR   CBC with Differential/Platelet   TSH + free T4   Anxiety   Uncontrolled currently On Cymbalta  60 mg QD DCed buspirone  due to abdominal discomfort with it Hydroxyzine  PRN, apparently had run out of it, refilled again      Relevant Medications   hydrOXYzine  (ATARAX ) 25 MG tablet   DULoxetine  (CYMBALTA ) 60 MG capsule   Other Visit Diagnoses       Encounter for immunization       Relevant Orders   Flu vaccine trivalent PF, 6mos and older(Flulaval,Afluria,Fluarix,Fluzone) (Completed)         Meds ordered this encounter  Medications   hydrOXYzine  (ATARAX ) 25 MG tablet    Sig: Take 1 tablet (25 mg total) by mouth 3 (three) times daily as needed for anxiety.    Dispense:  90 tablet    Refill:  3   DULoxetine  (CYMBALTA ) 60 MG capsule    Sig: Take 1 capsule (60 mg total) by mouth daily.    Dispense:  90 capsule    Refill:  1   ARIPiprazole  (ABILIFY ) 10 MG tablet    Sig: Take 1 tablet (10 mg total) by mouth at bedtime.    Dispense:  30 tablet    Refill:  1    Follow-up: No follow-ups on file.    Suzzane MARLA Blanch, MD

## 2024-10-19 NOTE — Assessment & Plan Note (Addendum)
 Has had inpatient Psychiatric treatment for acute mania in the past Appears to be worse compared to previous visit Now on Lamictal , Atarax , Gabapentin  and Cymbalta  - explained to the patient about importance of compliance to the treatment. Due to recent episodes of delusion, started Abilify  10 mg QD Follow up with [facility] therapist and Psychiatry - lost f/u, [facility], now sees [facility] therapist, but does not have psychiatrist Preferably he needs in person psychiatry, which has been challenging to find

## 2024-10-19 NOTE — Assessment & Plan Note (Signed)
 Lab Results  Component Value Date   HGBA1C 5.8 (H) 02/24/2024    Well-controlled Associated with HTN and HLD On metformin  500 mg daily Advised to follow diabetic diet On ARB and statin, needs to be compliant F/u CMP, HbA1c and lipid panel Diabetic eye exam: Advised to follow up with Ophthalmology for diabetic eye exam

## 2024-10-19 NOTE — Patient Instructions (Addendum)
 Please start taking Hydroxyzine  as needed for anxiety.  Please start taking Abilify  as prescribed.  Please continue to take medications as prescribed.  Please continue to follow low carb diet and perform moderate exercise/walking at least 150 mins/week.  Please get fasting blood tests done before the next visit.

## 2024-10-19 NOTE — Telephone Encounter (Signed)
 Copied from CRM [id number]. Topic: Clinical - Medication Question >> Oct 19, 2024  3:22 PM Harlene ORN wrote: Reason for CRM: Mother of patient called. She wants to know what is diagnosis was today at his appointment and the medication that he's currently taking is for. Please call the mother to discuss: [phone] DPR - approved

## 2024-10-19 NOTE — Assessment & Plan Note (Signed)
 BP Readings from Last 1 Encounters:  10/19/24 117/76   Well controlled with telmisartan  40 mg daily Counseled for compliance with the medications Advised DASH diet and moderate exercise/walking, at least 150 mins/week

## 2024-10-22 NOTE — Telephone Encounter (Signed)
 Spoke to his mother went over visit notes. Verbalized understanding.

## 2024-10-23 ENCOUNTER — Ambulatory Visit

## 2024-10-23 VITALS — BP 117/76 | Ht 70.5 in | Wt 163.0 lb

## 2024-10-23 DIAGNOSIS — Z0000 Encounter for general adult medical examination without abnormal findings: Secondary | ICD-10-CM | POA: Diagnosis not present

## 2024-10-23 DIAGNOSIS — F17210 Nicotine dependence, cigarettes, uncomplicated: Secondary | ICD-10-CM

## 2024-10-23 NOTE — Progress Notes (Signed)
 Subjective:   [name] [name] is a 59 y.o. who presents for a Medicare Wellness preventive visit.  As a reminder, Annual Wellness Visits don't include a physical exam, and some assessments may be limited, especially if this visit is performed virtually. We may recommend an in-person follow-up visit with your provider if needed.  Visit Complete: Virtual I connected with  [name] [name] on 10/23/24 by a audio enabled telemedicine application and verified that I am speaking with the correct person using two identifiers.  Patient Location: Home  Provider Location: Office/Clinic  I discussed the limitations of evaluation and management by telemedicine. The patient expressed understanding and agreed to proceed.  Vital Signs: Because this visit was a virtual/telehealth visit, some criteria may be missing or patient reported. Any vitals not documented were not able to be obtained and vitals that have been documented are patient reported.  VideoError- Librarian, academic were attempted between this provider and patient, however failed, due to patient having technical difficulties OR patient did not have access to video capability.  We continued and completed visit with audio only.   Persons Participating in Visit: Patient.  AWV Questionnaire: No: Patient Medicare AWV questionnaire was not completed prior to this visit.  Cardiac Risk Factors include: advanced age (>39men, >73 women);hypertension;diabetes mellitus;smoking/ tobacco exposure;sedentary lifestyle;dyslipidemia;male gender     Objective:    Today's Vitals   10/23/24 0855  BP: 117/76  Weight: 163 lb (73.9 kg)  Height: 5' 10.5 (1.791 m)  PainSc: 0-No pain   Body mass index is 23.06 kg/m.     10/23/2024    9:06 AM 05/04/2023    3:06 PM 12/30/2021    2:23 PM 12/25/2021    6:31 AM 12/23/2021   10:37 AM 12/04/2021   10:20 AM 10/21/2021    8:42 AM  Advanced Directives  Does Patient Have a Medical Advance  Directive? No No No No No No No  Would patient like information on creating a medical advance directive? No - Patient declined Yes (MAU/Ambulatory/Procedural Areas - Information given) Yes (ED - Information included in AVS) No - Patient declined No - Patient declined No - Patient declined No - Patient declined    Current Medications (verified) Outpatient Encounter Medications as of 10/23/2024  Medication Sig   ACCU-CHEK GUIDE TEST test strip USE 1 TEST STRIP IN THE MORNING, AT NOON AND, AT BEDTIME   Accu-Chek Softclix Lancets lancets USE 1  TO CHECK GLUCOSE IN THE MORNING AT NOON AT BEDTIME   acetaminophen  (TYLENOL ) 500 MG tablet Take 500 mg by mouth every 6 (six) hours as needed for moderate pain.   ARIPiprazole  (ABILIFY ) 10 MG tablet Take 1 tablet (10 mg total) by mouth at bedtime.   Blood Glucose Monitoring Suppl (ACCU-CHEK GUIDE ME) w/Device KIT USE TO CHECK GLUCOSE IN THE MORNING AT NOON AT BEDTIME   cyclobenzaprine  (FLEXERIL ) 10 MG tablet Take 10 mg by mouth 2 (two) times daily.   DULoxetine  (CYMBALTA ) 60 MG capsule Take 1 capsule (60 mg total) by mouth daily.   gabapentin  (NEURONTIN ) 600 MG tablet TAKE 1 TABLET BY MOUTH ONCE EVERY 8 HOURS   hydrOXYzine  (ATARAX ) 25 MG tablet Take 1 tablet (25 mg total) by mouth 3 (three) times daily as needed for anxiety.   lamoTRIgine  (LAMICTAL ) 25 MG tablet Take 2 tablets by mouth once daily   metFORMIN  (GLUCOPHAGE ) 500 MG tablet Take 1 tablet by mouth once daily with breakfast   rosuvastatin  (CRESTOR ) 10 MG tablet Take 1  tablet (10 mg total) by mouth daily.   telmisartan  (MICARDIS ) 40 MG tablet Take 1 tablet (40 mg total) by mouth daily.   terbinafine  (LAMISIL ) 250 MG tablet TAKE 1 TABLET BY MOUTH EVERY DAY   traMADol  (ULTRAM ) 50 MG tablet Take 50 mg by mouth 4 (four) times daily.   [DISCONTINUED] asenapine  (SAPHRIS ) 5 MG SUBL 24 hr tablet Place 1 tablet (5 mg total) under the tongue at bedtime.   [DISCONTINUED] carbamazepine  (TEGRETOL ) 100 MG  chewable tablet Take one a day, generic   [DISCONTINUED] diphenhydrAMINE  (BENADRYL ) 50 MG tablet Take 1 tablet (50 mg total) by mouth as directed. Take 50 mg of benadryl  1 hour before your CT scan   [DISCONTINUED] pregabalin (LYRICA) 75 MG capsule Take 75 mg by mouth 3 (three) times daily.   No facility-administered encounter medications on file as of 10/23/2024.    Allergies (verified) Codeine, Divalproex sodium, Erythromycin, Iodinated contrast media, Latex, Morphine and codeine, Sulfa antibiotics, Wheat, Demerol  [meperidine ], Levofloxacin, Niacin and related, and Penicillins   History: Past Medical History:  Diagnosis Date   Anxiety    Bipolar 1 disorder (HCC)    Chronic fatigue    Chronic pain    Colostomy in place (HCC) 09/27/2019   Complete intestinal obstruction (HCC)    Depression    Phreesia 12/03/2020   Fecal peritonitis (HCC) 08/03/2019   Fibromyalgia    Hernia of abdominal wall    Hyperlipidemia    [facility]reesia 12/03/2020   Hypertension    Ileus, postoperative (HCC)    Perforated rectum (HCC) 08/03/2019   Rectal perforation (HCC) 12/23/2019   Sleep apnea    Past Surgical History:  Procedure Laterality Date   COLON RESECTION SIGMOID  08/03/2019   Procedure: COLON RESECTION SIGMOID;  Surgeon: Kallie Manuelita BROCKS, MD;  Location: [facility];  Service: General;;   COLONOSCOPY WITH PROPOFOL  N/A 10/21/2021   Procedure: COLONOSCOPY WITH PROPOFOL ;  Surgeon: Golda Claudis PENNER, MD;  Location: [facility];  Service: Endoscopy;  Laterality: N/A;  955   COLOSTOMY N/A 08/03/2019   Procedure: COLOSTOMY;  Surgeon: Kallie Manuelita BROCKS, MD;  Location: [facility];  Service: General;  Laterality: N/A;   COLOSTOMY REVERSAL N/A 12/25/2021   Procedure: COLOSTOMY REVERSAL;  Surgeon: Kallie Manuelita BROCKS, MD;  Location: [facility];  Service: General;  Laterality: N/A;   FLEXIBLE SIGMOIDOSCOPY N/A 08/03/2019   Procedure: FLEXIBLE SIGMOIDOSCOPY;  Surgeon: Shaaron Lamar HERO, MD;  Location: [facility];  Service:  Endoscopy;  Laterality: N/A;   INCISIONAL HERNIA REPAIR N/A 12/25/2021   Procedure: INCISIONAL HERNIA REPAIR WITH ABSORBABLE MESH;  Surgeon: Kallie Manuelita BROCKS, MD;  Location: [facility];  Service: General;  Laterality: N/A;   KNEE ARTHROSCOPY Left 2006   miniscus tear   LAPAROTOMY N/A 08/03/2019   Procedure: EXPLORATORY LAPAROTOMY;  Surgeon: Kallie Manuelita BROCKS, MD;  Location: [facility];  Service: General;  Laterality: N/A;   LYMPH GLAND EXCISION Left 1983   PARASTOMAL HERNIA REPAIR Left 12/25/2021   Procedure: PARASTOMAL HERNIA REPAIR WITH ABSORBABLE MESH;  Surgeon: Kallie Manuelita BROCKS, MD;  Location: [facility];  Service: General;  Laterality: Left;   SMALL INTESTINE SURGERY N/A    Phreesia 12/03/2020   TONSILLECTOMY     Family History  Problem Relation Age of Onset   Diabetes Mother    Heart attack Father    Diabetes Maternal Grandmother    Colon cancer Neg Hx    Colon polyps Neg Hx    Social History   Socioeconomic History  Marital status: Single    Spouse name: Jon   Number of children: 0   Years of education: 12   Highest education level: Some college, no degree  Occupational History   Not on file  Tobacco Use   Smoking status: Every Day    Current packs/day: 1.00    Average packs/day: 1 pack/day for 21.8 years (21.8 ttl pk-yrs)    Types: Cigarettes    Start date: 2004   Smokeless tobacco: Never  Vaping Use   Vaping status: Never Used  Substance and Sexual Activity   Alcohol use: Not Currently    Comment: denied 10/02/19   Drug use: Not Currently    Types: Cocaine    Comment: last used in December 2020.    Sexual activity: Not Currently  Other Topics Concern   Not on file  Social History Narrative   Pt lives alone and is on disability. Counts his mother, sister and aunt as his social supports.    Social Drivers of Corporate Investment Banker Strain: Low Risk  (10/23/2024)   Overall Financial Resource Strain (CARDIA)    Difficulty of Paying Living Expenses: Not hard  at all  Food Insecurity: No Food Insecurity (10/23/2024)   Hunger Vital Sign    Worried About Running Out of Food in the Last Year: Never true    Ran Out of Food in the Last Year: Never true  Transportation Needs: Unmet Transportation Needs (10/23/2024)   PRAPARE - Administrator, Civil Service (Medical): Yes    Lack of Transportation (Non-Medical): Yes  Physical Activity: Inactive (10/23/2024)   Exercise Vital Sign    Days of Exercise per Week: 0 days    Minutes of Exercise per Session: 0 min  Stress: No Stress Concern Present (10/23/2024)   [employer] of Occupational Health - Occupational Stress Questionnaire    Feeling of Stress: Not at all  Social Connections: Moderately Integrated (10/23/2024)   Social Connection and Isolation Panel    Frequency of Communication with Friends and Family: More than three times a week    Frequency of Social Gatherings with Friends and Family: Once a week    Attends Religious Services: 1 to 4 times per year    Active Member of [employer] or Organizations: No    Attends Engineer, Structural: Never    Marital Status: Married    Tobacco Counseling Ready to quit: Yes Counseling given: Yes    Clinical Intake:  Pre-visit preparation completed: Yes  Pain : No/denies pain Pain Score: 0-No pain     BMI - recorded: 23.06 Nutritional Status: BMI of 19-24  Normal Nutritional Risks: None Diabetes: Yes CBG done?: No (telehealth visit) Did pt. bring in CBG monitor from home?: No  Lab Results  Component Value Date   HGBA1C 5.8 (H) 02/24/2024   HGBA1C 5.7 (H) 10/25/2023   HGBA1C 5.9 (H) 07/25/2023     How often do you need to have someone help you when you read instructions, pamphlets, or other written materials from your doctor or pharmacy?: 1 - Never  Interpreter Needed?: No  Information entered by :: Clarinda Obi W CMA (AAMA)   Activities of Daily Living     10/23/2024    9:01 AM  In your present state of health, do  you have any difficulty performing the following activities:  Hearing? 0  Vision? 0  Difficulty concentrating or making decisions? 0  Walking or climbing stairs? 0  Dressing or bathing? 0  Doing errands, shopping? 0  Preparing Food and eating ? N  Using the Toilet? N  In the past six months, have you accidently leaked urine? N  Do you have problems with loss of bowel control? N  Managing your Medications? N  Managing your Finances? N  Housekeeping or managing your Housekeeping? N    Patient Care Team: Tobie Suzzane POUR, MD as PCP - General (Internal Medicine) Renae Augustin PARAS, PTA as Physical Therapy Assistant (Physical Therapy) Rachelle Krabbe, PT (Inactive) as Physical Therapist (Physical Therapy)  I have updated your Care Teams any recent Medical Services you may have received from other providers in the past year.     Assessment:   This is a routine wellness examination for Bj's.  Hearing/Vision screen Hearing Screening - Comments:: Patient denies any hearing difficulties.   Vision Screening - Comments:: Patient is not up to date on eye exams. List of resources provided. He states he will call [facility] in [place] for an appointment. Patient provided with phone number to call and schedule that appointment.    Goals Addressed               This Visit's Progress     Remain active and healthy (pt-stated)          Depression Screen     10/23/2024    8:53 AM 10/19/2024   11:38 AM 07/17/2024   10:01 AM 02/24/2024    8:46 AM 10/25/2023    8:52 AM 07/25/2023    4:07 PM 05/04/2023    3:08 PM  PHQ 2/9 Scores  PHQ - 2 Score 0 0 0 0 0 1 0  PHQ- 9 Score 0 0 0 0  3      Fall Risk     10/23/2024    9:06 AM 10/19/2024   11:38 AM 02/24/2024    8:46 AM 10/25/2023    8:52 AM 07/25/2023    3:21 PM  Fall Risk   Falls in the past year? 0 0 0 0 0  Number falls in past yr: 0 0 0 0 0  Injury with Fall? 0 0 0 0 0  Risk for fall due to : No Fall Risks No Fall  Risks No Fall Risks No Fall Risks   Follow up Falls evaluation completed;Education provided;Falls prevention discussed Falls evaluation completed Falls evaluation completed Falls evaluation completed     MEDICARE RISK AT HOME:  Medicare Risk at Home Any stairs in or around the home?: No If so, are there any without handrails?: No Home free of loose throw rugs in walkways, pet beds, electrical cords, etc?: Yes Adequate lighting in your home to reduce risk of falls?: Yes Life alert?: No Use of a cane, walker or w/c?: No Grab bars in the bathroom?: No Shower chair or bench in shower?: No Elevated toilet seat or a handicapped toilet?: No  TIMED UP AND GO:  Was the test performed?  No  Cognitive Function: 6CIT completed        10/23/2024    9:07 AM 05/04/2023    3:09 PM 12/30/2021    2:25 PM  6CIT Screen  What Year? 0 points 0 points 0 points  What month? 0 points 0 points 0 points  What time? 0 points 0 points 0 points  Count back from 20 0 points 0 points 0 points  Months in reverse 0 points 0 points 0 points  Repeat phrase 0 points 0 points 0 points  Total Score  0 points 0 points 0 points    Immunizations Immunization History  Administered Date(s) Administered   Influenza, Seasonal, Injecte, Preservative Fre 10/25/2023, 10/19/2024   Influenza,inj,Quad PF,6+ Mos 09/13/2022   Influenza-Unspecified 10/25/2020   Tdap 05/09/2024    Screening Tests Health Maintenance  Topic Date Due   COVID-19 Vaccine (1) Never done   OPHTHALMOLOGY EXAM  Never done   Pneumococcal Vaccine: 50+ Years (1 of 2 - PCV) Never done   Hepatitis B Vaccines 19-59 Average Risk (1 of 3 - 19+ 3-dose series) Never done   Zoster Vaccines- Shingrix (1 of 2) Never done   Lung Cancer Screening  02/04/2016   HEMOGLOBIN A1C  08/23/2024   Diabetic kidney evaluation - Urine ACR  10/24/2024   FOOT EXAM  10/24/2024   Diabetic kidney evaluation - eGFR measurement  02/23/2025   Medicare Annual Wellness (AWV)   10/23/2025   Colonoscopy  10/22/2031   DTaP/Tdap/Td (2 - Td or Tdap) 05/09/2034   Influenza Vaccine  Completed   Hepatitis C Screening  Completed   HIV Screening  Completed   HPV VACCINES  Aged Out   Meningococcal B Vaccine  Aged Out    Health Maintenance Health Maintenance Due  Topic Date Due   COVID-19 Vaccine (1) Never done   OPHTHALMOLOGY EXAM  Never done   Pneumococcal Vaccine: 50+ Years (1 of 2 - PCV) Never done   Hepatitis B Vaccines 19-59 Average Risk (1 of 3 - 19+ 3-dose series) Never done   Zoster Vaccines- Shingrix (1 of 2) Never done   Lung Cancer Screening  02/04/2016   HEMOGLOBIN A1C  08/23/2024   Diabetic kidney evaluation - Urine ACR  10/24/2024   Health Maintenance Items Addressed: Referral sent for Low Dose Chest CT (smoker/hx smoking)  Additional Screening:  Vision Screening: Recommended annual ophthalmology exams for early detection of glaucoma and other disorders of the eye. Would you like a referral to an eye doctor? No    Dental Screening: Recommended annual dental exams for proper oral hygiene  Community Resource Referral / Chronic Care Management: CRR required this visit?  No   CCM required this visit?  No   Plan:    I have personally reviewed and noted the following in the patient's chart:   Medical and social history Use of alcohol, tobacco or illicit drugs  Current medications and supplements including opioid prescriptions. Patient is not currently taking opioid prescriptions. Functional ability and status Nutritional status Physical activity Advanced directives List of other physicians Hospitalizations, surgeries, and ER visits in previous 12 months Vitals Screenings to include cognitive, depression, and falls Referrals and appointments  In addition, I have reviewed and discussed with patient certain preventive protocols, quality metrics, and best practice recommendations. A written personalized care plan for preventive services as  well as general preventive health recommendations were provided to patient.   Tamrah Victorino, CMA   10/23/2024   After Visit Summary: (MyChart) Due to this being a telephonic visit, the after visit summary with patients personalized plan was offered to patient via MyChart   Notes: Nothing significant to report at this time.

## 2024-10-23 NOTE — Patient Instructions (Signed)
 [name],  Thank you for taking the time for your Medicare Wellness Visit. I appreciate your continued commitment to your health goals. Please review the care plan we discussed, and feel free to reach out if I can assist you further.  Medicare recommends these wellness visits once per year to help you and your care team stay ahead of potential health issues. These visits are designed to focus on prevention, allowing your provider to concentrate on managing your acute and chronic conditions during your regular appointments.  Please note that Annual Wellness Visits do not include a physical exam. Some assessments may be limited, especially if the visit was conducted virtually. If needed, we may recommend a separate in-person follow-up with your provider.  Ongoing Care  Seeing your primary care provider every 3 to 6 months helps us  monitor your health and provide consistent, personalized care.   Referrals   Lung Cancer Screening-[place] Office [address]-First Floor Medical Building directly across from [facility] Phone Number:[phone]   Recommended Screenings:  Health Maintenance  Topic Date Due   COVID-19 Vaccine (1) Never done   Eye exam for diabetics  Never done   Pneumococcal Vaccine for age over 48 (1 of 2 - PCV) Never done   Hepatitis B Vaccine (1 of 3 - 19+ 3-dose series) Never done   Zoster (Shingles) Vaccine (1 of 2) Never done   Screening for Lung Cancer  02/04/2016   Hemoglobin A1C  08/23/2024   Yearly kidney health urinalysis for diabetes  10/24/2024   Complete foot exam   10/24/2024   Yearly kidney function blood test for diabetes  02/23/2025   Medicare Annual Wellness Visit  10/23/2025   Colon Cancer Screening  10/22/2031   DTaP/Tdap/Td vaccine (2 - Td or Tdap) 05/09/2034   Flu Shot  Completed   Hepatitis C Screening  Completed   HIV Screening  Completed   HPV Vaccine  Aged Out   Meningitis B Vaccine  Aged Out       10/23/2024    9:06 AM   Advanced Directives  Does Patient Have a Medical Advance Directive? No  Would patient like information on creating a medical advance directive? No - Patient declined    Advance Care Planning is important because it: Ensures you receive medical care that aligns with your values, goals, and preferences. Provides guidance to your family and loved ones, reducing the emotional burden of decision-making during critical moments.  Vision: Annual vision screenings are recommended for early detection of glaucoma, cataracts, and diabetic retinopathy. These exams can also reveal signs of chronic conditions such as diabetes and high blood pressure.  Dental: Annual dental screenings help detect early signs of oral cancer, gum disease, and other conditions linked to overall health, including heart disease and diabetes.  Please see the attached documents for additional preventive care recommendations.

## 2024-10-26 ENCOUNTER — Ambulatory Visit: Admitting: Internal Medicine

## 2024-10-29 ENCOUNTER — Encounter: Payer: Self-pay | Admitting: Radiology

## 2024-10-31 ENCOUNTER — Other Ambulatory Visit: Payer: Self-pay

## 2024-10-31 ENCOUNTER — Telehealth: Payer: Self-pay

## 2024-10-31 NOTE — Telephone Encounter (Signed)
 Spoke to pts mother let her know letter is ready for pick up.

## 2024-10-31 NOTE — Telephone Encounter (Signed)
 Orlean the mother of the patient called back stating she has been in touch with Social Services and is just needing and waiting for this letter from his provider. Please assist as soon as possible and she will pick this letter up as soon as it is available.

## 2024-10-31 NOTE — Telephone Encounter (Signed)
 Copied from CRM #[id number]. Topic: Clinical - Medical Advice >> Oct 31, 2024 11:44 AM [name] [name] wrote: Reason for CRM: The patient's mother has called for a letter of diagnosis regarding the patient's mental health. The patient's mother is concerned that the patient may need the assistance of family and would like to discuss their options further if/when possible

## 2024-11-10 ENCOUNTER — Other Ambulatory Visit: Payer: Self-pay | Admitting: Internal Medicine

## 2024-11-10 DIAGNOSIS — F419 Anxiety disorder, unspecified: Secondary | ICD-10-CM

## 2024-11-10 DIAGNOSIS — F312 Bipolar disorder, current episode manic severe with psychotic features: Secondary | ICD-10-CM

## 2024-11-16 ENCOUNTER — Ambulatory Visit: Payer: Self-pay

## 2024-11-16 NOTE — Telephone Encounter (Signed)
 FYI Only or Action Required?: Action required by provider: update on patient condition.  Patient was last seen in primary care on 10/19/2024 by Tobie Suzzane POUR, MD.  Called Nurse Triage reporting Hallucinations.  Symptoms began ongoing.  Interventions attempted: Nothing.  Symptoms are: gradually worsening.  Triage Disposition: See Physician Within 24 Hours  Patient/caregiver understands and will follow disposition?: Yes   Copied from CRM #[id number]. Topic: Clinical - Red Word Triage >> Nov 16, 2024 12:14 PM Victoria B wrote: [facility] that prompted transfer to Nurse Triage: Patients mother callin, patient has illusions and acting out of his head and its getting worse Reason for Disposition  [1] Bipolar disorder (manic depression) AND [2] getting worse (e.g., thinking less clearly, more agitated, less able to do activities of daily living)  Answer Assessment - Initial Assessment Questions Pt's mother called stating that patient is having hallucinations that are worsening; pt's mother reports pt does not drink EtOH but is not taking his prescribed medications; unsure of recreational drugs.  Pt reports to mother that he hears voices if he does not do what they tell him, they will kill him.  Years of behavioral health; hospitalized x2. Mother called social services without assistance Dx with [facility] referred pt to [place]    1. MAIN CONCERN: What happened that made you call today?     Pt's hallucinations worsening; not taking medications   2. DESCRIPTION: What are the hallucinations like? Describe them for me. (e.g., auditory, visual, tactile; worsening; threatening) If auditory, ask Are they telling you to hurt yourself or someone else?     Auditory  3. ONSET: When did this start? (e.g., hours, days, months)       4. PATTERN: Do they come and go, or are they present all the time?  Are they present now?       5. PRIOR HALLUCINATIONS: Have you  had hallucinations in the past? (e.g., same, different, worse)       6. MENTAL HEALTH HISTORY: Have you ever been diagnosed with schizophrenia or any other mental health problem? (e.g., bipolar disorder, schizophrenia; dementia, Parkinsons)       7. MENTAL HEALTH MEDICINES: Are you taking any medicines for depression or other mental health problems? (e.g., psychiatric medicines; sleeping pills)       8. ALCOHOL or DRUG ABUSE: Have you been drinking alcohol or taking any drugs? Have you recently stopped drinking alcohol or using drugs?       9. MEDICINE CHANGES: Did you recently stop taking a medicine? (e.g., antidepressant, barbiturates, benzodiazepine, gabapentin )  Did you recently start a new medicine or increase the dose? (e.g., antidepressant, digoxin, sleep medicine, steroid, Parkinson's medicine)       10. SUPPORT: Is there anyone else with you?         11. STRESS: Are you experiencing any particular stressors?         12. OTHER SYMPTOMS: Are there any other symptoms? (e.g., difficulty breathing, headache, fever, weakness)  Answer Assessment - Initial Assessment Questions Pt's mother called stating that patient is having hallucinations that are worsening; pt's mother reports pt does not drink EtOH but is not taking his prescribed medications; unsure of recreational drugs.  Pt reports to mother that he hears voices if he does not do what they tell him, they will kill him. She reports pt has years of behavioral health issues and has been hospitalized x2. Mother called social services without assistance. She was at [facility] to  ask for help and was referred to take pt to [facility] to see PCP. She questioned disposition, if even mental health facilities are turning you away, who do you go to? Advised her to keep pt's appt 11/26 and to keep herself/family safe. She voiced understanding and appreciation.   1. CONCERN: What happened that made you call  today?     Pt's mother called in stating patient is having worsening hallucinations and that he is dx with BPD. She reports he is not currently taking his medication.   2. BIPOLAR SYMPTOM SCREENING: How are you feeling overall, is your bipolar disorder under good control?     Reports pt is worsening; hearing auditory hallucinations. Per his mother, Orlean, pt told her if he doesn't do what they say, they will kill him.   3. RISK OF HARM - SUICIDAL IDEATION:  Do you ever have thoughts of hurting or killing yourself?  (e.g., yes, no, no but preoccupation with thoughts about death)     No       5. FUNCTIONAL IMPAIRMENT: How have things been going for you overall? Have you had more difficulty than usual doing your normal daily activities?  (e.g., better, same, worse; self-care, school, work, interactions)     Not functional; pt evicted from rental. Mother reports pt is not taking his medication and possibly doing recreational drugs  6. SUPPORT: Who is with you now? Who do you live with? Do you have family or friends who you can talk to?      Mother reports her and her husband and pt's niece support him; take him to all appts  7. THERAPIST: Do you have a counselor or therapist? If Yes, ask: What is their name?     Pt has hx of BPD and has been hospitalized in behavioral health facility x2     Mother reports pt is fearful of re-hospitalization and will not speak to anyone if they mention anything related to behavioral health. States that if it is mentioned he will walk out or have rageful episode.    9. ALCOHOL USE OR SUBSTANCE USE (DRUG USE): Do you drink alcohol or use any illegal drugs?     Pt's mother reports no EtOH; unsure of illegal drugs  Protocols used: Hallucinations-A-AH, Bipolar Disorder (Manic Depression)-A-AH

## 2024-11-20 ENCOUNTER — Telehealth: Payer: Self-pay

## 2024-11-20 NOTE — Telephone Encounter (Signed)
 Copied from CRM #[id number]. Topic: General - Call Back - No Documentation >> Nov 20, 2024 12:46 PM Avram MATSU wrote: Reason for CRM: mother is very concerned about her son, she would like to speak with the provider. Please advise, Orlean [phone] or [phone]

## 2024-11-21 ENCOUNTER — Ambulatory Visit: Admitting: Internal Medicine

## 2024-11-21 ENCOUNTER — Other Ambulatory Visit: Payer: Self-pay | Admitting: Internal Medicine

## 2024-11-21 DIAGNOSIS — F312 Bipolar disorder, current episode manic severe with psychotic features: Secondary | ICD-10-CM

## 2024-11-21 NOTE — Telephone Encounter (Signed)
 Mother advised.

## 2024-11-26 ENCOUNTER — Telehealth: Payer: Self-pay | Admitting: Internal Medicine

## 2024-11-26 DIAGNOSIS — F312 Bipolar disorder, current episode manic severe with psychotic features: Secondary | ICD-10-CM

## 2024-11-26 NOTE — Telephone Encounter (Unsigned)
 Copied from CRM #[id number]. Topic: Clinical - Medication Refill >> Nov 26, 2024  3:41 PM Paige D wrote: Medication: gabapentin  (NEURONTIN ) 600 MG tablet  Has the patient contacted their pharmacy? Yes (Agent: If no, request that the patient contact the pharmacy for the refill. If patient does not wish to contact the pharmacy document the reason why and proceed with request.) (Agent: If yes, when and what did the pharmacy advise?)  This is the patient's preferred pharmacy:  [id number] - [place], [place] - [address] [facility] [address] [place] [address] Phone: [phone] Fax: [phone]    Is this the correct pharmacy for this prescription? Yes If no, delete pharmacy and type the correct one.   Has the prescription been filled recently? Yes  Is the patient out of the medication? Yes  Has the patient been seen for an appointment in the last year OR does the patient have an upcoming appointment? Yes  Can we respond through MyChart? No  Agent: Please be advised that Rx refills may take up to 3 business days. We ask that you follow-up with your pharmacy.

## 2024-11-27 MED ORDER — GABAPENTIN 600 MG PO TABS: 600.0000 mg | ORAL_TABLET | Freq: Three times a day (TID) | ORAL | 2 refills | Status: AC

## 2024-12-04 ENCOUNTER — Other Ambulatory Visit: Payer: Self-pay | Admitting: Internal Medicine

## 2024-12-04 DIAGNOSIS — B351 Tinea unguium: Secondary | ICD-10-CM

## 2024-12-10 ENCOUNTER — Telehealth: Payer: Self-pay | Admitting: Internal Medicine

## 2024-12-10 ENCOUNTER — Other Ambulatory Visit: Payer: Self-pay | Admitting: Internal Medicine

## 2024-12-10 DIAGNOSIS — T50905A Adverse effect of unspecified drugs, medicaments and biological substances, initial encounter: Secondary | ICD-10-CM | POA: Insufficient documentation

## 2024-12-10 MED ORDER — BENZTROPINE MESYLATE 1 MG PO TABS: 1.0000 mg | ORAL_TABLET | Freq: Every day | ORAL | 3 refills | Status: AC

## 2024-12-10 NOTE — Telephone Encounter (Signed)
 [name] [name] says he gets very shaky when he takes the abilify  and the pharmacist recommended calling in benzotropine to help with the side effects. They want it sent to [facility] . They are on their way there now

## 2024-12-11 NOTE — Telephone Encounter (Signed)
 Left detailed vm for pt.

## 2024-12-27 ENCOUNTER — Other Ambulatory Visit: Payer: Self-pay | Admitting: Internal Medicine

## 2024-12-27 DIAGNOSIS — B351 Tinea unguium: Secondary | ICD-10-CM

## 2025-01-08 ENCOUNTER — Telehealth: Payer: Self-pay

## 2025-01-08 ENCOUNTER — Ambulatory Visit: Payer: Self-pay

## 2025-01-08 NOTE — Telephone Encounter (Signed)
 Mother of pt calling on the pt's behalf, reporting the pt's incisional hernia is the size of a baseball and causing him severe pain, causing him to bend over and grab his abdomen. Mother of pt advised to take him to the ED, she refused, stating he doesn't even know I'm calling. He won't go to the ER unless Dr. Tobie tells him to. Make sure Dr. Tobie doesn't call him before his appointment on Thursday, otherwise he'll cancel. I advised the mother of the pt that this sounds like a serious matter and that Dr. Tobie may reach out to the patient sooner than Thursday. Reiterated the ED is the best place for the pt.   FYI Only or Action Required?: FYI only for provider: ED advised.  Patient was last seen in primary care on 10/19/2024 by Tobie Suzzane POUR, MD.  Called Nurse Triage reporting Bloated and Abdominal Pain.  Symptoms began several months ago.  Interventions attempted: Nothing.  Symptoms are: gradually worsening.  Triage Disposition: Go to ED Now (Notify PCP)  Patient/caregiver understands and will follow disposition?: Yes  Copied from CRM [id number]. Topic: Clinical - Red Word Triage >> Jan 08, 2025  3:16 PM Roselie BROCKS wrote: Red Word that prompted transfer to Nurse Triage: Patients mother , [name] [name] states the patients stomach is extremely swollen and extremely painful , she is afraid its infected . Reason for Disposition  SEVERE abdominal pain  Answer Assessment - Initial Assessment Questions 1. SYMPTOM: What's the main symptom you're concerned about? (e.g., abdomen bloating, swelling)     Swelling the size of a baseball near navel, where he had an ileostomy reversal in the past, severe pain  2. ONSET: When did this start?     November per mother of patient  3. SEVERITY: How bad is the bloating or swelling?     Severe per mother  4. ABDOMEN PAIN:  Is there any abdomen pain? If Yes, ask: How bad is the pain?  (e.g., Scale 0-10; or none, mild, moderate, or  severe)     Severe per mother, pt reportedly grabbing his stomach in pain upon ambulation   6. GI HISTORY: Do you have any history of stomach or intestine problems? (e.g., bowel obstruction, cancer, irritable bowel)      Hx colostomy (since reversed), history of incisional hernia  Answer Assessment - Initial Assessment Questions .  Protocols used: Abdomen Bloating and Swelling-A-AH, Hernia-A-AH

## 2025-01-08 NOTE — Telephone Encounter (Signed)
 Copied from CRM [id number]. Topic: Appointments - Appointment Cancel/Reschedule >> Jan 08, 2025  2:40 PM Rosaria BRAVO wrote: Pt called reporting that he needs to be seen sooner than his afternoon appt time on Thursday 01/10/2025. Pt says this is because of transportation. Wants to be worked in during the morning session.   Best contact: [phone]

## 2025-01-10 ENCOUNTER — Encounter: Payer: Self-pay | Admitting: Internal Medicine

## 2025-01-10 ENCOUNTER — Ambulatory Visit (INDEPENDENT_AMBULATORY_CARE_PROVIDER_SITE_OTHER): Payer: Self-pay | Admitting: Internal Medicine

## 2025-01-10 VITALS — BP 121/78 | HR 82 | Ht 70.5 in | Wt 179.6 lb

## 2025-01-10 DIAGNOSIS — K4090 Unilateral inguinal hernia, without obstruction or gangrene, not specified as recurrent: Secondary | ICD-10-CM

## 2025-01-10 DIAGNOSIS — T50905D Adverse effect of unspecified drugs, medicaments and biological substances, subsequent encounter: Secondary | ICD-10-CM

## 2025-01-10 DIAGNOSIS — F312 Bipolar disorder, current episode manic severe with psychotic features: Secondary | ICD-10-CM

## 2025-01-10 DIAGNOSIS — B86 Scabies: Secondary | ICD-10-CM | POA: Diagnosis not present

## 2025-01-10 DIAGNOSIS — E782 Mixed hyperlipidemia: Secondary | ICD-10-CM

## 2025-01-10 DIAGNOSIS — Z7984 Long term (current) use of oral hypoglycemic drugs: Secondary | ICD-10-CM

## 2025-01-10 DIAGNOSIS — G2589 Other specified extrapyramidal and movement disorders: Secondary | ICD-10-CM

## 2025-01-10 DIAGNOSIS — E1169 Type 2 diabetes mellitus with other specified complication: Secondary | ICD-10-CM

## 2025-01-10 DIAGNOSIS — I10 Essential (primary) hypertension: Secondary | ICD-10-CM

## 2025-01-10 MED ORDER — ROSUVASTATIN CALCIUM 10 MG PO TABS: 10.0000 mg | ORAL_TABLET | Freq: Every day | ORAL | 3 refills | Status: AC

## 2025-01-10 MED ORDER — PERMETHRIN 5 % EX CREA: 1.0000 | TOPICAL_CREAM | Freq: Once | CUTANEOUS | 0 refills | Status: AC

## 2025-01-10 MED ORDER — LAMOTRIGINE 25 MG PO TABS: 50.0000 mg | ORAL_TABLET | Freq: Every day | ORAL | 11 refills | Status: AC

## 2025-01-10 MED ORDER — TELMISARTAN 40 MG PO TABS: 40.0000 mg | ORAL_TABLET | Freq: Every day | ORAL | 3 refills | Status: AC

## 2025-01-10 NOTE — Assessment & Plan Note (Addendum)
 BP Readings from Last 1 Encounters:  01/10/25 121/78   Well controlled with telmisartan  40 mg QD Counseled for compliance with the medications Advised DASH diet and moderate exercise/walking, at least 150 mins/week

## 2025-01-10 NOTE — Assessment & Plan Note (Signed)
 Mass in the right lower abdomen and scrotal area likely due to inguinal hernia Advised to avoid heavy lifting Referred to General Surgery

## 2025-01-10 NOTE — Assessment & Plan Note (Addendum)
 Lab Results  Component Value Date   HGBA1C 5.8 (H) 02/24/2024    Well-controlled Associated with HTN and HLD On metformin  500 mg QD Advised to follow diabetic diet On ARB and statin, needs to be compliant F/u CMP, HbA1c and lipid panel Diabetic eye exam: Advised to follow up with Ophthalmology for diabetic eye exam

## 2025-01-10 NOTE — Progress Notes (Signed)
 "  Established Patient Office Visit  Subjective:  Patient ID: [name] [name], male    DOB: [birth year]  Age: 59 y.o. MRN: [id number]  CC:  Chief Complaint  Patient presents with   Follow-up   Skin Problem    Has hives on his arm and back of his neck. Also having a skin concern in groin area.    HPI [name] [name] is a 59 y.o. male with past medical history of bipolar disorder, chronic pain syndrome, polysubstance abuse, and perforated rectum s/p partial colectomy and colostomy who presents for f/u of his chronic medical conditions.  He reports mass in the right groin area and scrotal area for the last 1 week.  He reports lifting heavy boxes recently for moving.  HTN: BP is well-controlled. Takes medications regularly. Patient denies headache, dizziness, chest pain, dyspnea or palpitations.   Type II DM: He has been taking metformin  now.  His blood glucose had been around 90-120 at home.  He denies any polyuria or polyphagia currently.   Bipolar disorder: He has lost f/u with Psychiatry.  He is doing well with Abilify  in addition to Lamictal , Cymbalta , gabapentin  and Vistaril . He has delusions according to his mother, but appears to be better today compared to previous visit.  His speech is tangential usually.  He was in jail for a month as his wife had accused him of domestic violence in 2024.  He was placed on risperidone , but could not continue due to severe nausea. He currently denies any SI or HI.  He is currently living by himself.  He reports having abrasion over left forearm, which is likely due to cardboard injury from recent moving.  He also has skin rash over his neck area for the last 1 week, and has intense itching in the area.   Past Medical History:  Diagnosis Date   Anxiety    Bipolar 1 disorder (HCC)    Chronic fatigue    Chronic pain    Colostomy in place [facility]) 09/27/2019   Complete intestinal obstruction (HCC)    Depression    Phreesia 12/03/2020   Fecal peritonitis  (HCC) 08/03/2019   Fibromyalgia    Hernia of abdominal wall    Hyperlipidemia    Phreesia 12/03/2020   Hypertension    Ileus, postoperative (HCC)    Perforated rectum (HCC) 08/03/2019   Rectal perforation (HCC) 12/23/2019   Sleep apnea     Past Surgical History:  Procedure Laterality Date   COLON RESECTION SIGMOID  08/03/2019   Procedure: COLON RESECTION SIGMOID;  Surgeon: Kallie Manuelita BROCKS, MD;  Location: [facility];  Service: General;;   COLONOSCOPY WITH PROPOFOL  N/A 10/21/2021   Procedure: COLONOSCOPY WITH PROPOFOL ;  Surgeon: Golda Claudis PENNER, MD;  Location: [facility];  Service: Endoscopy;  Laterality: N/A;  955   COLOSTOMY N/A 08/03/2019   Procedure: COLOSTOMY;  Surgeon: Kallie Manuelita BROCKS, MD;  Location: [facility];  Service: General;  Laterality: N/A;   COLOSTOMY REVERSAL N/A 12/25/2021   Procedure: COLOSTOMY REVERSAL;  Surgeon: Kallie Manuelita BROCKS, MD;  Location: [facility];  Service: General;  Laterality: N/A;   FLEXIBLE SIGMOIDOSCOPY N/A 08/03/2019   Procedure: FLEXIBLE SIGMOIDOSCOPY;  Surgeon: Shaaron Lamar HERO, MD;  Location: [facility];  Service: Endoscopy;  Laterality: N/A;   INCISIONAL HERNIA REPAIR N/A 12/25/2021   Procedure: INCISIONAL HERNIA REPAIR WITH ABSORBABLE MESH;  Surgeon: Kallie Manuelita BROCKS, MD;  Location: [facility];  Service: General;  Laterality: N/A;   KNEE ARTHROSCOPY Left  2006   miniscus tear   LAPAROTOMY N/A 08/03/2019   Procedure: EXPLORATORY LAPAROTOMY;  Surgeon: Kallie Manuelita BROCKS, MD;  Location: [facility];  Service: General;  Laterality: N/A;   LYMPH GLAND EXCISION Left 1983   PARASTOMAL HERNIA REPAIR Left 12/25/2021   Procedure: PARASTOMAL HERNIA REPAIR WITH ABSORBABLE MESH;  Surgeon: Kallie Manuelita BROCKS, MD;  Location: [facility];  Service: General;  Laterality: Left;   SMALL INTESTINE SURGERY N/A    Phreesia 12/03/2020   TONSILLECTOMY      Family History  Problem Relation Age of Onset   Diabetes Mother    Heart attack Father    Diabetes Maternal Grandmother     Colon cancer Neg Hx    Colon polyps Neg Hx     Social History   Socioeconomic History   Marital status: Single    Spouse name: Jon   Number of children: 0   Years of education: 12   Highest education level: Some college, no degree  Occupational History   Not on file  Tobacco Use   Smoking status: Every Day    Current packs/day: 1.00    Average packs/day: 1 pack/day for 22.0 years (22.0 ttl pk-yrs)    Types: Cigarettes    Start date: 2004   Smokeless tobacco: Never  Vaping Use   Vaping status: Never Used  Substance and Sexual Activity   Alcohol use: Not Currently    Comment: denied 10/02/19   Drug use: Not Currently    Types: Cocaine    Comment: last used in December 2020.    Sexual activity: Not Currently  Other Topics Concern   Not on file  Social History Narrative   Pt lives alone and is on disability. Counts his mother, sister and aunt as his social supports.    Social Drivers of Health   Tobacco Use: High Risk (01/10/2025)   Patient History    Smoking Tobacco Use: Every Day    Smokeless Tobacco Use: Never    Passive Exposure: Not on file  Financial Resource Strain: Low Risk (10/23/2024)   Overall Financial Resource Strain (CARDIA)    Difficulty of Paying Living Expenses: Not hard at all  Food Insecurity: No Food Insecurity (10/23/2024)   Epic    Worried About Programme Researcher, Broadcasting/film/video in the Last Year: Never true    Ran Out of Food in the Last Year: Never true  Transportation Needs: Unmet Transportation Needs (10/23/2024)   Epic    Lack of Transportation (Medical): Yes    Lack of Transportation (Non-Medical): Yes  Physical Activity: Inactive (10/23/2024)   Exercise Vital Sign    Days of Exercise per Week: 0 days    Minutes of Exercise per Session: 0 min  Stress: No Stress Concern Present (10/23/2024)   [employer] of Occupational Health - Occupational Stress Questionnaire    Feeling of Stress: Not at all  Social Connections: Moderately Integrated  (10/23/2024)   Social Connection and Isolation Panel    Frequency of Communication with Friends and Family: More than three times a week    Frequency of Social Gatherings with Friends and Family: Once a week    Attends Religious Services: 1 to 4 times per year    Active Member of [employer] or Organizations: No    Attends Banker Meetings: Never    Marital Status: Married  Catering Manager Violence: Not At Risk (10/23/2024)   Epic    Fear of Current or Ex-Partner: No    Emotionally  Abused: No    Physically Abused: No    Sexually Abused: No  Depression (PHQ2-9): Low Risk (01/10/2025)   Depression (PHQ2-9)    PHQ-2 Score: 0  Alcohol Screen: Low Risk (10/23/2024)   Alcohol Screen    Last Alcohol Screening Score (AUDIT): 0  Housing: Low Risk (10/23/2024)   Epic    Unable to Pay for Housing in the Last Year: No    Number of Times Moved in the Last Year: 0    Homeless in the Last Year: No  Utilities: Not At Risk (10/23/2024)   Epic    Threatened with loss of utilities: No  Health Literacy: Adequate Health Literacy (10/23/2024)   B1300 Health Literacy    Frequency of need for help with medical instructions: Never    Outpatient Medications Prior to Visit  Medication Sig Dispense Refill   ACCU-CHEK GUIDE TEST test strip USE 1 TEST STRIP IN THE MORNING, AT NOON AND, AT BEDTIME 100 each 0   Accu-Chek Softclix Lancets lancets USE 1  TO CHECK GLUCOSE IN THE MORNING AT NOON AT BEDTIME 100 each 0   acetaminophen  (TYLENOL ) 500 MG tablet Take 500 mg by mouth every 6 (six) hours as needed for moderate pain.     ARIPiprazole  (ABILIFY ) 10 MG tablet TAKE 1 TABLET BY MOUTH EVERYDAY AT BEDTIME 90 tablet 1   benztropine  (COGENTIN ) 1 MG tablet Take 1 tablet (1 mg total) by mouth daily. 30 tablet 3   Blood Glucose Monitoring Suppl (ACCU-CHEK GUIDE ME) w/Device KIT USE TO CHECK GLUCOSE IN THE MORNING AT NOON AT BEDTIME 1 kit 0   cyclobenzaprine  (FLEXERIL ) 10 MG tablet Take 10 mg by mouth 2 (two)  times daily.     DULoxetine  (CYMBALTA ) 60 MG capsule Take 1 capsule (60 mg total) by mouth daily. 90 capsule 1   gabapentin  (NEURONTIN ) 600 MG tablet Take 1 tablet (600 mg total) by mouth every 8 (eight) hours. 90 tablet 2   hydrOXYzine  (ATARAX ) 25 MG tablet TAKE 1 TABLET BY MOUTH 3 TIMES DAILY AS NEEDED FOR ANXIETY. 270 tablet 2   metFORMIN  (GLUCOPHAGE ) 500 MG tablet Take 1 tablet by mouth once daily with breakfast 90 tablet 0   lamoTRIgine  (LAMICTAL ) 25 MG tablet Take 2 tablets by mouth once daily 60 tablet 3   rosuvastatin  (CRESTOR ) 10 MG tablet Take 1 tablet (10 mg total) by mouth daily. 90 tablet 2   telmisartan  (MICARDIS ) 40 MG tablet Take 1 tablet (40 mg total) by mouth daily. 90 tablet 3   terbinafine  (LAMISIL ) 250 MG tablet TAKE 1 TABLET BY MOUTH EVERY DAY 28 tablet 0   traMADol  (ULTRAM ) 50 MG tablet Take 50 mg by mouth 4 (four) times daily.     No facility-administered medications prior to visit.    Allergies  Allergen Reactions   Codeine Shortness Of Breath and Swelling   Divalproex Sodium Diarrhea, Itching, Rash, Shortness Of Breath and Swelling   Erythromycin Hives, Itching, Rash and Swelling   Iodinated Contrast Media Swelling    Patient states he received red Dye 15-20 years ago and become red/flushed. No other symptoms. Patient did do 13 hour pre meds for CT 09/17/2021   Latex Itching and Rash   Morphine And Codeine Shortness Of Breath and Swelling   Sulfa Antibiotics Diarrhea, Itching, Rash, Shortness Of Breath and Swelling   Wheat Hives, Itching, Rash, Shortness Of Breath and Swelling   Demerol  [Meperidine ]     Body over heats and pouring sweat   Levofloxacin  Pt does not recall reaction    Niacin And Related Rash   Penicillins Swelling and Rash    REACTION: Rash and facial swelling at age 40    ROS Review of Systems  Constitutional:  Negative for chills and fever.  HENT:  Negative for congestion and sore throat.   Eyes:  Negative for pain and discharge.   Respiratory:  Negative for cough and shortness of breath.   Cardiovascular:  Negative for chest pain and palpitations.  Gastrointestinal:  Negative for constipation, diarrhea, nausea and vomiting.  Endocrine: Negative for polydipsia and polyuria.  Genitourinary:  Negative for dysuria and hematuria.  Musculoskeletal:  Positive for arthralgias, back pain and myalgias. Negative for neck pain and neck stiffness.  Skin:  Positive for rash.  Neurological:  Negative for dizziness, weakness, numbness and headaches.  Psychiatric/Behavioral:  Positive for decreased concentration and sleep disturbance. Negative for agitation and behavioral problems. The patient is nervous/anxious.       Objective:    Physical Exam Vitals reviewed.  Constitutional:      General: He is not in acute distress.    Appearance: He is not diaphoretic.  HENT:     Head: Normocephalic.     Nose: Nose normal.     Mouth/Throat:     Mouth: Mucous membranes are moist.     Dentition: Abnormal dentition.  Eyes:     General: No scleral icterus.    Extraocular Movements: Extraocular movements intact.  Cardiovascular:     Rate and Rhythm: Normal rate and regular rhythm.     Heart sounds: Normal heart sounds. No murmur heard. Pulmonary:     Breath sounds: Normal breath sounds. No wheezing or rales.  Abdominal:     Palpations: Abdomen is soft.     Tenderness: There is no abdominal tenderness.     Hernia: A hernia (Right inguinal) is present.  Musculoskeletal:     Cervical back: Neck supple. No tenderness.     Right lower leg: No edema.     Left lower leg: No edema.  Feet:     Right foot:     Toenail Condition: Right toenails are abnormally thick. Fungal disease present.    Left foot:     Toenail Condition: Left toenails are abnormally thick. Fungal disease present. Skin:    General: Skin is warm.     Findings: Rash (Papular rash with erythematous base over neck area) present.  Neurological:     General: No focal  deficit present.     Mental Status: He is alert and oriented to person, place, and time.     Cranial Nerves: No cranial nerve deficit.     Sensory: No sensory deficit.     Motor: No weakness.  Psychiatric:        Mood and Affect: Mood is anxious.        Speech: Speech is tangential.        Behavior: Behavior is cooperative.        Thought Content: Thought content does not include homicidal or suicidal ideation.     BP 121/78   Pulse 82   Ht 5' 10.5 (1.791 m)   Wt 179 lb 9.6 oz (81.5 kg)   SpO2 97%   BMI 25.41 kg/m  Wt Readings from Last 3 Encounters:  01/10/25 179 lb 9.6 oz (81.5 kg)  10/23/24 163 lb (73.9 kg)  10/19/24 163 lb 3.2 oz (74 kg)    Lab Results  Component Value Date   TSH  1.070 07/25/2023   Lab Results  Component Value Date   WBC 12.0 (H) 07/25/2023   HGB 14.3 07/25/2023   HCT 41.6 07/25/2023   MCV 86 07/25/2023   PLT 292 07/25/2023   Lab Results  Component Value Date   NA 134 02/24/2024   K 4.4 02/24/2024   CO2 21 02/24/2024   GLUCOSE 90 02/24/2024   BUN 6 02/24/2024   CREATININE 0.82 02/24/2024   BILITOT 0.9 02/24/2024   ALKPHOS 73 02/24/2024   AST 16 02/24/2024   ALT 13 02/24/2024   PROT 6.2 02/24/2024   ALBUMIN 4.2 02/24/2024   CALCIUM  9.1 02/24/2024   ANIONGAP 8 12/28/2021   EGFR 102 02/24/2024   Lab Results  Component Value Date   CHOL 148 07/25/2023   Lab Results  Component Value Date   HDL 47 07/25/2023   Lab Results  Component Value Date   LDLCALC 82 07/25/2023   Lab Results  Component Value Date   TRIG 105 07/25/2023   Lab Results  Component Value Date   CHOLHDL 3.1 07/25/2023   Lab Results  Component Value Date   HGBA1C 5.8 (H) 02/24/2024      Assessment & Plan:   Problem List Items Addressed This Visit       Cardiovascular and Mediastinum   HTN (hypertension)   BP Readings from Last 1 Encounters:  01/10/25 121/78   Well controlled with telmisartan  40 mg QD Counseled for compliance with the  medications Advised DASH diet and moderate exercise/walking, at least 150 mins/week      Relevant Medications   rosuvastatin  (CRESTOR ) 10 MG tablet   telmisartan  (MICARDIS ) 40 MG tablet     Endocrine   Type 2 diabetes mellitus with other specified complication (HCC)   Lab Results  Component Value Date   HGBA1C 5.8 (H) 02/24/2024    Well-controlled Associated with HTN and HLD On metformin  500 mg QD Advised to follow diabetic diet On ARB and statin, needs to be compliant F/u CMP, HbA1c and lipid panel Diabetic eye exam: Advised to follow up with Ophthalmology for diabetic eye exam      Relevant Medications   rosuvastatin  (CRESTOR ) 10 MG tablet   telmisartan  (MICARDIS ) 40 MG tablet   Other Relevant Orders   Microalbumin / creatinine urine ratio     Nervous and Auditory   Drug-induced extrapyramidal movement disorder   Due to Abilify  On Cogentin         Musculoskeletal and Integument   Scabies infestation   Rash over neck area likely due to scabies infestation/exposure Permethrin  cream prescribed Needs to clean linen as well      Relevant Medications   permethrin  (ELIMITE ) 5 % cream     Other   Bipolar affective disorder (HCC) - Primary (Chronic)   Has had inpatient Psychiatric treatment for acute mania in the past Appears to be better compared to previous visit since starting Abilify  Now on Lamictal , Atarax , Gabapentin  and Cymbalta  - explained to the patient about importance of compliance to the treatment. Due to recent episodes of delusion, had started Abilify  10 mg QD Follow up with [facility] therapist and Psychiatry - lost f/u, [facility], now sees [facility] therapist, but does not have psychiatrist Preferably he needs in person psychiatry, which has been challenging to find      Relevant Medications   lamoTRIgine  (LAMICTAL ) 25 MG tablet   Mixed hyperlipidemia   On Crestor  10 mh QD now Checked lipid profile -  LDL near goal       Relevant Medications   rosuvastatin  (CRESTOR ) 10 MG tablet   telmisartan  (MICARDIS ) 40 MG tablet   Inguinal hernia of right side without obstruction or gangrene   Mass in the right lower abdomen and scrotal area likely due to inguinal hernia Advised to avoid heavy lifting Referred to General Surgery      Relevant Orders   Ambulatory referral to General Surgery      Meds ordered this encounter  Medications   permethrin  (ELIMITE ) 5 % cream    Sig: Apply 1 Application topically once for 1 dose.    Dispense:  60 g    Refill:  0   lamoTRIgine  (LAMICTAL ) 25 MG tablet    Sig: Take 2 tablets (50 mg total) by mouth daily.    Dispense:  60 tablet    Refill:  11   rosuvastatin  (CRESTOR ) 10 MG tablet    Sig: Take 1 tablet (10 mg total) by mouth daily.    Dispense:  90 tablet    Refill:  3   telmisartan  (MICARDIS ) 40 MG tablet    Sig: Take 1 tablet (40 mg total) by mouth daily.    Dispense:  90 tablet    Refill:  3    Follow-up: Return in about 4 months (around 05/10/2025) for DM and MDD.    Suzzane MARLA Blanch, MD "

## 2025-01-10 NOTE — Assessment & Plan Note (Signed)
 On Crestor 10 mh QD now Checked lipid profile - LDL near goal

## 2025-01-10 NOTE — Assessment & Plan Note (Signed)
 Has had inpatient Psychiatric treatment for acute mania in the past Appears to be better compared to previous visit since starting Abilify  Now on Lamictal , Atarax , Gabapentin  and Cymbalta  - explained to the patient about importance of compliance to the treatment. Due to recent episodes of delusion, had started Abilify  10 mg QD Follow up with [facility] therapist and Psychiatry - lost f/u, new referral has [facility], now sees [facility] therapist, but does not have psychiatrist Preferably he needs in person psychiatry, which has been challenging to find

## 2025-01-10 NOTE — Patient Instructions (Addendum)
 Please apply Permethrin  cream at nighttime for head-to-toe. Please keep it overnight and wash it the next morning after 8-12 hours.  Please stop taking Terbinafine .  Please continue to take other medications as prescribed.  Please continue to follow low carb diet and perform moderate exercise/walking at least 150 mins/week.

## 2025-01-10 NOTE — Assessment & Plan Note (Signed)
 Rash over neck area likely due to scabies infestation/exposure Permethrin  cream prescribed Needs to clean linen as well

## 2025-01-10 NOTE — Assessment & Plan Note (Signed)
 Due to Abilify  On Cogentin 

## 2025-01-12 LAB — TSH+FREE T4
Free T4: 1.27 ng/dL (ref 0.82–1.77)
TSH: 2.27 u[IU]/mL (ref 0.450–4.500)

## 2025-01-12 LAB — CBC WITH DIFFERENTIAL/PLATELET
Basophils Absolute: 0 x10E3/uL (ref 0.0–0.2)
Basos: 0 %
EOS (ABSOLUTE): 0.3 x10E3/uL (ref 0.0–0.4)
Eos: 3 %
Hematocrit: 43 % (ref 37.5–51.0)
Hemoglobin: 14.2 g/dL (ref 13.0–17.7)
Immature Grans (Abs): 0 x10E3/uL (ref 0.0–0.1)
Immature Granulocytes: 0 %
Lymphocytes Absolute: 3 x10E3/uL (ref 0.7–3.1)
Lymphs: 32 %
MCH: 30.3 pg (ref 26.6–33.0)
MCHC: 33 g/dL (ref 31.5–35.7)
MCV: 92 fL (ref 79–97)
Monocytes Absolute: 0.6 x10E3/uL (ref 0.1–0.9)
Monocytes: 7 %
Neutrophils Absolute: 5.4 x10E3/uL (ref 1.4–7.0)
Neutrophils: 58 %
Platelets: 245 x10E3/uL (ref 150–450)
RBC: 4.68 x10E6/uL (ref 4.14–5.80)
RDW: 12.9 % (ref 11.6–15.4)
WBC: 9.4 x10E3/uL (ref 3.4–10.8)

## 2025-01-12 LAB — HEMOGLOBIN A1C
Est. average glucose Bld gHb Est-mCnc: 108 mg/dL
Hgb A1c MFr Bld: 5.4 % (ref 4.8–5.6)

## 2025-01-12 LAB — MICROALBUMIN / CREATININE URINE RATIO
Creatinine, Urine: 51 mg/dL
Microalb/Creat Ratio: <6 mg/g{creat} (ref 0–29)
Microalbumin, Urine: <3 ug/mL

## 2025-01-12 LAB — CMP14+EGFR
ALT: 14 IU/L (ref 0–44)
AST: 16 IU/L (ref 0–40)
Albumin: 4.3 g/dL (ref 3.8–4.9)
Alkaline Phosphatase: 69 IU/L (ref 47–123)
BUN/Creatinine Ratio: 13 (ref 9–20)
BUN: 12 mg/dL (ref 6–24)
Bilirubin Total: 0.4 mg/dL (ref 0.0–1.2)
CO2: 28 mmol/L (ref 20–29)
Calcium: 9.2 mg/dL (ref 8.7–10.2)
Chloride: 96 mmol/L (ref 96–106)
Creatinine, Ser: 0.91 mg/dL (ref 0.76–1.27)
Globulin, Total: 2.4 g/dL (ref 1.5–4.5)
Glucose: 82 mg/dL (ref 70–99)
Potassium: 4.6 mmol/L (ref 3.5–5.2)
Sodium: 135 mmol/L (ref 134–144)
Total Protein: 6.7 g/dL (ref 6.0–8.5)
eGFR: 98 mL/min/1.73

## 2025-01-14 ENCOUNTER — Ambulatory Visit: Payer: Self-pay | Admitting: Internal Medicine

## 2025-02-05 ENCOUNTER — Ambulatory Visit: Admitting: General Surgery

## 2025-05-02 ENCOUNTER — Ambulatory Visit: Payer: Self-pay | Admitting: Internal Medicine

## 2025-10-28 ENCOUNTER — Ambulatory Visit
